# Patient Record
Sex: Male | Born: 1955 | Race: Black or African American | Hispanic: No | Marital: Married | Smoking: Never smoker
Health system: Southern US, Community
[De-identification: ages and names within clinical notes are randomized; demographics above are authoritative.]

## PROBLEM LIST (undated history)

## (undated) DIAGNOSIS — I10 Essential (primary) hypertension: Secondary | ICD-10-CM

## (undated) DIAGNOSIS — I82409 Acute embolism and thrombosis of unspecified deep veins of unspecified lower extremity: Secondary | ICD-10-CM

## (undated) DIAGNOSIS — F419 Anxiety disorder, unspecified: Secondary | ICD-10-CM

## (undated) DIAGNOSIS — J1100 Influenza due to unidentified influenza virus with unspecified type of pneumonia: Secondary | ICD-10-CM

## (undated) DIAGNOSIS — F528 Other sexual dysfunction not due to a substance or known physiological condition: Secondary | ICD-10-CM

## (undated) DIAGNOSIS — E669 Obesity, unspecified: Secondary | ICD-10-CM

## (undated) DIAGNOSIS — E7800 Pure hypercholesterolemia, unspecified: Secondary | ICD-10-CM

## (undated) DIAGNOSIS — T7840XA Allergy, unspecified, initial encounter: Secondary | ICD-10-CM

## (undated) DIAGNOSIS — M1990 Unspecified osteoarthritis, unspecified site: Secondary | ICD-10-CM

## (undated) DIAGNOSIS — E119 Type 2 diabetes mellitus without complications: Secondary | ICD-10-CM

## (undated) DIAGNOSIS — E739 Lactose intolerance, unspecified: Secondary | ICD-10-CM

## (undated) DIAGNOSIS — R7303 Prediabetes: Secondary | ICD-10-CM

## (undated) DIAGNOSIS — J45909 Unspecified asthma, uncomplicated: Secondary | ICD-10-CM

## (undated) DIAGNOSIS — J449 Chronic obstructive pulmonary disease, unspecified: Secondary | ICD-10-CM

## (undated) HISTORY — DX: Anxiety disorder, unspecified: F41.9

## (undated) HISTORY — DX: Essential (primary) hypertension: I10

## (undated) HISTORY — DX: Pure hypercholesterolemia, unspecified: E78.00

## (undated) HISTORY — DX: Chronic obstructive pulmonary disease, unspecified: J44.9

## (undated) HISTORY — DX: Influenza due to unidentified influenza virus with unspecified type of pneumonia: J11.00

## (undated) HISTORY — DX: Lactose intolerance, unspecified: E73.9

## (undated) HISTORY — DX: Obesity, unspecified: E66.9

## (undated) HISTORY — DX: Other sexual dysfunction not due to a substance or known physiological condition: F52.8

## (undated) HISTORY — DX: Acute embolism and thrombosis of unspecified deep veins of unspecified lower extremity: I82.409

## (undated) HISTORY — DX: Prediabetes: R73.03

## (undated) HISTORY — DX: Type 2 diabetes mellitus without complications: E11.9

## (undated) HISTORY — DX: Allergy, unspecified, initial encounter: T78.40XA

## (undated) HISTORY — DX: Unspecified asthma, uncomplicated: J45.909

## (undated) HISTORY — DX: Unspecified osteoarthritis, unspecified site: M19.90

## (undated) HISTORY — PX: TONSILLECTOMY AND ADENOIDECTOMY: SHX28

## (undated) HISTORY — PX: HAND SURGERY: SHX662

---

## 2002-06-20 ENCOUNTER — Ambulatory Visit (HOSPITAL_COMMUNITY): Admission: RE | Admit: 2002-06-20 | Discharge: 2002-06-20 | Payer: Self-pay | Admitting: Gastroenterology

## 2006-01-22 ENCOUNTER — Ambulatory Visit: Payer: Self-pay | Admitting: Family Medicine

## 2006-05-07 ENCOUNTER — Ambulatory Visit: Payer: Self-pay | Admitting: Family Medicine

## 2006-05-07 LAB — CONVERTED CEMR LAB
BUN: 12 mg/dL (ref 6–23)
CO2: 28 meq/L (ref 19–32)
Creatinine, Ser: 1.1 mg/dL (ref 0.4–1.5)
Glomerular Filtration Rate, Af Am: 91 mL/min/{1.73_m2}
PSA: 0.51 ng/mL (ref 0.10–4.00)
Potassium: 3.8 meq/L (ref 3.5–5.1)
Sodium: 141 meq/L (ref 135–145)

## 2006-09-02 DIAGNOSIS — E1159 Type 2 diabetes mellitus with other circulatory complications: Secondary | ICD-10-CM | POA: Insufficient documentation

## 2006-09-02 DIAGNOSIS — J309 Allergic rhinitis, unspecified: Secondary | ICD-10-CM

## 2006-09-02 DIAGNOSIS — I10 Essential (primary) hypertension: Secondary | ICD-10-CM

## 2006-09-02 DIAGNOSIS — F528 Other sexual dysfunction not due to a substance or known physiological condition: Secondary | ICD-10-CM

## 2006-10-01 ENCOUNTER — Ambulatory Visit: Payer: Self-pay | Admitting: Family Medicine

## 2006-10-01 LAB — CONVERTED CEMR LAB
BUN: 13 mg/dL (ref 6–23)
Calcium: 8.9 mg/dL (ref 8.4–10.5)
Chloride: 108 meq/L (ref 96–112)
GFR calc Af Amer: 101 mL/min
GFR calc non Af Amer: 83 mL/min
LDL Cholesterol: 113 mg/dL — ABNORMAL HIGH (ref 0–99)
Total CHOL/HDL Ratio: 5.1

## 2006-10-11 ENCOUNTER — Ambulatory Visit: Payer: Self-pay | Admitting: Family Medicine

## 2006-10-16 ENCOUNTER — Encounter: Admission: RE | Admit: 2006-10-16 | Discharge: 2006-10-16 | Payer: Self-pay | Admitting: Family Medicine

## 2006-12-20 ENCOUNTER — Ambulatory Visit: Payer: Self-pay | Admitting: Family Medicine

## 2006-12-21 ENCOUNTER — Telehealth (INDEPENDENT_AMBULATORY_CARE_PROVIDER_SITE_OTHER): Payer: Self-pay | Admitting: *Deleted

## 2007-01-03 ENCOUNTER — Encounter (INDEPENDENT_AMBULATORY_CARE_PROVIDER_SITE_OTHER): Payer: Self-pay | Admitting: Family Medicine

## 2007-01-07 ENCOUNTER — Encounter: Payer: Self-pay | Admitting: Family Medicine

## 2007-07-17 ENCOUNTER — Telehealth (INDEPENDENT_AMBULATORY_CARE_PROVIDER_SITE_OTHER): Payer: Self-pay | Admitting: *Deleted

## 2007-08-02 ENCOUNTER — Emergency Department (HOSPITAL_COMMUNITY): Admission: EM | Admit: 2007-08-02 | Discharge: 2007-08-02 | Payer: Self-pay | Admitting: Emergency Medicine

## 2007-08-16 ENCOUNTER — Telehealth (INDEPENDENT_AMBULATORY_CARE_PROVIDER_SITE_OTHER): Payer: Self-pay | Admitting: *Deleted

## 2007-09-03 ENCOUNTER — Ambulatory Visit: Payer: Self-pay | Admitting: Family Medicine

## 2007-09-04 ENCOUNTER — Encounter (INDEPENDENT_AMBULATORY_CARE_PROVIDER_SITE_OTHER): Payer: Self-pay | Admitting: *Deleted

## 2007-09-04 LAB — CONVERTED CEMR LAB
BUN: 13 mg/dL (ref 6–23)
Calcium: 9.2 mg/dL (ref 8.4–10.5)
Chloride: 105 meq/L (ref 96–112)
Creatinine, Ser: 1.1 mg/dL (ref 0.4–1.5)
GFR calc non Af Amer: 74 mL/min

## 2007-10-09 ENCOUNTER — Ambulatory Visit: Payer: Self-pay | Admitting: Family Medicine

## 2007-10-09 DIAGNOSIS — J158 Pneumonia due to other specified bacteria: Secondary | ICD-10-CM | POA: Insufficient documentation

## 2007-10-09 DIAGNOSIS — J1100 Influenza due to unidentified influenza virus with unspecified type of pneumonia: Secondary | ICD-10-CM | POA: Insufficient documentation

## 2007-10-09 LAB — CONVERTED CEMR LAB: Inflenza A Ag: POSITIVE

## 2007-10-11 ENCOUNTER — Telehealth (INDEPENDENT_AMBULATORY_CARE_PROVIDER_SITE_OTHER): Payer: Self-pay | Admitting: *Deleted

## 2007-10-17 ENCOUNTER — Ambulatory Visit: Payer: Self-pay | Admitting: Family Medicine

## 2007-12-24 ENCOUNTER — Telehealth (INDEPENDENT_AMBULATORY_CARE_PROVIDER_SITE_OTHER): Payer: Self-pay | Admitting: *Deleted

## 2007-12-24 ENCOUNTER — Encounter: Payer: Self-pay | Admitting: Family Medicine

## 2008-01-06 ENCOUNTER — Encounter: Payer: Self-pay | Admitting: Family Medicine

## 2008-01-14 ENCOUNTER — Ambulatory Visit: Payer: Self-pay | Admitting: Family Medicine

## 2008-01-14 ENCOUNTER — Telehealth (INDEPENDENT_AMBULATORY_CARE_PROVIDER_SITE_OTHER): Payer: Self-pay | Admitting: *Deleted

## 2008-01-14 DIAGNOSIS — M79609 Pain in unspecified limb: Secondary | ICD-10-CM

## 2008-01-15 ENCOUNTER — Encounter: Payer: Self-pay | Admitting: Family Medicine

## 2008-01-16 ENCOUNTER — Ambulatory Visit: Payer: Self-pay | Admitting: Family Medicine

## 2008-01-16 DIAGNOSIS — S60559A Superficial foreign body of unspecified hand, initial encounter: Secondary | ICD-10-CM | POA: Insufficient documentation

## 2008-01-23 ENCOUNTER — Encounter (INDEPENDENT_AMBULATORY_CARE_PROVIDER_SITE_OTHER): Payer: Self-pay | Admitting: *Deleted

## 2008-04-14 ENCOUNTER — Telehealth (INDEPENDENT_AMBULATORY_CARE_PROVIDER_SITE_OTHER): Payer: Self-pay | Admitting: *Deleted

## 2008-07-31 ENCOUNTER — Telehealth (INDEPENDENT_AMBULATORY_CARE_PROVIDER_SITE_OTHER): Payer: Self-pay | Admitting: *Deleted

## 2008-09-16 ENCOUNTER — Ambulatory Visit: Payer: Self-pay | Admitting: Family Medicine

## 2008-11-25 ENCOUNTER — Telehealth (INDEPENDENT_AMBULATORY_CARE_PROVIDER_SITE_OTHER): Payer: Self-pay | Admitting: *Deleted

## 2009-03-16 ENCOUNTER — Telehealth (INDEPENDENT_AMBULATORY_CARE_PROVIDER_SITE_OTHER): Payer: Self-pay | Admitting: *Deleted

## 2009-07-19 ENCOUNTER — Telehealth (INDEPENDENT_AMBULATORY_CARE_PROVIDER_SITE_OTHER): Payer: Self-pay | Admitting: *Deleted

## 2009-08-23 ENCOUNTER — Encounter: Payer: Self-pay | Admitting: Family Medicine

## 2009-09-06 ENCOUNTER — Encounter (INDEPENDENT_AMBULATORY_CARE_PROVIDER_SITE_OTHER): Payer: Self-pay | Admitting: *Deleted

## 2009-09-06 ENCOUNTER — Ambulatory Visit: Payer: Self-pay | Admitting: Family Medicine

## 2009-09-06 DIAGNOSIS — M940 Chondrocostal junction syndrome [Tietze]: Secondary | ICD-10-CM | POA: Insufficient documentation

## 2009-09-06 DIAGNOSIS — M109 Gout, unspecified: Secondary | ICD-10-CM

## 2009-10-11 ENCOUNTER — Ambulatory Visit: Payer: Self-pay | Admitting: Family Medicine

## 2009-10-11 DIAGNOSIS — M545 Low back pain: Secondary | ICD-10-CM

## 2009-10-11 LAB — CONVERTED CEMR LAB
Blood in Urine, dipstick: NEGATIVE
Nitrite: NEGATIVE
Protein, U semiquant: NEGATIVE
Specific Gravity, Urine: 1.025
Urobilinogen, UA: 0.2
WBC Urine, dipstick: NEGATIVE

## 2009-11-01 ENCOUNTER — Ambulatory Visit: Payer: Self-pay | Admitting: Family Medicine

## 2010-02-23 ENCOUNTER — Encounter: Payer: Self-pay | Admitting: Family Medicine

## 2010-02-28 ENCOUNTER — Emergency Department (HOSPITAL_COMMUNITY): Admission: EM | Admit: 2010-02-28 | Discharge: 2010-03-01 | Payer: Self-pay | Admitting: Emergency Medicine

## 2010-03-01 ENCOUNTER — Ambulatory Visit: Payer: Self-pay | Admitting: Vascular Surgery

## 2010-03-01 ENCOUNTER — Ambulatory Visit: Admission: RE | Admit: 2010-03-01 | Discharge: 2010-03-01 | Payer: Self-pay | Admitting: Emergency Medicine

## 2010-03-01 ENCOUNTER — Emergency Department (HOSPITAL_COMMUNITY): Admission: EM | Admit: 2010-03-01 | Discharge: 2010-03-01 | Payer: Self-pay | Admitting: Emergency Medicine

## 2010-03-01 ENCOUNTER — Encounter (INDEPENDENT_AMBULATORY_CARE_PROVIDER_SITE_OTHER): Payer: Self-pay | Admitting: Emergency Medicine

## 2010-03-03 ENCOUNTER — Encounter: Payer: Self-pay | Admitting: Family Medicine

## 2010-03-03 DIAGNOSIS — I82409 Acute embolism and thrombosis of unspecified deep veins of unspecified lower extremity: Secondary | ICD-10-CM | POA: Insufficient documentation

## 2010-03-04 ENCOUNTER — Ambulatory Visit: Payer: Self-pay | Admitting: Family Medicine

## 2010-03-07 ENCOUNTER — Ambulatory Visit: Payer: Self-pay | Admitting: Family Medicine

## 2010-03-08 ENCOUNTER — Telehealth (INDEPENDENT_AMBULATORY_CARE_PROVIDER_SITE_OTHER): Payer: Self-pay | Admitting: *Deleted

## 2010-03-10 ENCOUNTER — Encounter: Payer: Self-pay | Admitting: Family Medicine

## 2010-03-10 ENCOUNTER — Ambulatory Visit: Payer: Self-pay | Admitting: Family Medicine

## 2010-03-10 ENCOUNTER — Ambulatory Visit (HOSPITAL_COMMUNITY): Admission: RE | Admit: 2010-03-10 | Discharge: 2010-03-10 | Payer: Self-pay | Admitting: Family Medicine

## 2010-03-11 ENCOUNTER — Telehealth: Payer: Self-pay | Admitting: Family Medicine

## 2010-03-11 ENCOUNTER — Telehealth (INDEPENDENT_AMBULATORY_CARE_PROVIDER_SITE_OTHER): Payer: Self-pay | Admitting: *Deleted

## 2010-03-15 ENCOUNTER — Telehealth: Payer: Self-pay | Admitting: Family Medicine

## 2010-03-16 ENCOUNTER — Ambulatory Visit: Payer: Self-pay | Admitting: Family Medicine

## 2010-03-16 LAB — CONVERTED CEMR LAB: POC INR: 3.4

## 2010-03-23 ENCOUNTER — Ambulatory Visit: Payer: Self-pay | Admitting: Family Medicine

## 2010-03-23 LAB — CONVERTED CEMR LAB: POC INR: 2.7

## 2010-05-03 ENCOUNTER — Ambulatory Visit: Payer: Self-pay | Admitting: Family Medicine

## 2010-05-03 LAB — CONVERTED CEMR LAB: INR: 3.8

## 2010-05-10 ENCOUNTER — Ambulatory Visit: Payer: Self-pay | Admitting: Family Medicine

## 2010-05-10 LAB — CONVERTED CEMR LAB: INR: 3

## 2010-06-07 ENCOUNTER — Ambulatory Visit: Payer: Self-pay | Admitting: Family Medicine

## 2010-06-21 ENCOUNTER — Ambulatory Visit: Payer: Self-pay | Admitting: Family Medicine

## 2010-06-21 LAB — CONVERTED CEMR LAB: INR: 2.7

## 2010-07-19 ENCOUNTER — Ambulatory Visit
Admission: RE | Admit: 2010-07-19 | Discharge: 2010-07-19 | Payer: Self-pay | Source: Home / Self Care | Attending: Family Medicine | Admitting: Family Medicine

## 2010-07-26 ENCOUNTER — Ambulatory Visit
Admission: RE | Admit: 2010-07-26 | Discharge: 2010-07-26 | Payer: Self-pay | Source: Home / Self Care | Attending: Family Medicine | Admitting: Family Medicine

## 2010-08-09 ENCOUNTER — Ambulatory Visit
Admission: RE | Admit: 2010-08-09 | Discharge: 2010-08-09 | Payer: Self-pay | Source: Home / Self Care | Attending: Family Medicine | Admitting: Family Medicine

## 2010-08-14 LAB — CONVERTED CEMR LAB
ALT: 16 units/L (ref 0–53)
Albumin: 3.8 g/dL (ref 3.5–5.2)
Albumin: 3.9 g/dL (ref 3.5–5.2)
Alkaline Phosphatase: 43 units/L (ref 39–117)
BUN: 13 mg/dL (ref 6–23)
Basophils Relative: 0.1 % (ref 0.0–3.0)
Bilirubin Urine: NEGATIVE
Bilirubin, Direct: 0.1 mg/dL (ref 0.0–0.3)
Blood in Urine, dipstick: NEGATIVE
CO2: 27 meq/L (ref 19–32)
CRP, High Sensitivity: 1 (ref 0.00–5.00)
Calcium: 8.8 mg/dL (ref 8.4–10.5)
Calcium: 9 mg/dL (ref 8.4–10.5)
Chloride: 112 meq/L (ref 96–112)
Cholesterol: 164 mg/dL (ref 0–200)
Eosinophils Absolute: 0.1 10*3/uL (ref 0.0–0.7)
Eosinophils Absolute: 0.1 10*3/uL (ref 0.0–0.7)
GFR calc Af Amer: 101 mL/min
Glucose, Bld: 104 mg/dL — ABNORMAL HIGH (ref 70–99)
Glucose, Urine, Semiquant: NEGATIVE
HCT: 43.1 % (ref 39.0–52.0)
Hemoglobin: 13.4 g/dL (ref 13.0–17.0)
Hemoglobin: 14.3 g/dL (ref 13.0–17.0)
Ketones, urine, test strip: NEGATIVE
Lymphocytes Relative: 27 % (ref 12.0–46.0)
MCHC: 32.1 g/dL (ref 30.0–36.0)
MCV: 83.6 fL (ref 78.0–100.0)
MCV: 84.3 fL (ref 78.0–100.0)
Monocytes Absolute: 0.6 10*3/uL (ref 0.1–1.0)
Monocytes Relative: 9.3 % (ref 3.0–12.0)
Neutro Abs: 3.8 10*3/uL (ref 1.4–7.7)
Neutro Abs: 3.8 10*3/uL (ref 1.4–7.7)
Nitrite: NEGATIVE
Nitrite: NEGATIVE
RBC: 4.93 M/uL (ref 4.22–5.81)
RDW: 14.2 % (ref 11.5–14.6)
Sodium: 140 meq/L (ref 135–145)
Sodium: 142 meq/L (ref 135–145)
Specific Gravity, Urine: 1.025
Specific Gravity, Urine: 1.025
TSH: 1.26 microintl units/mL (ref 0.35–5.50)
Total CHOL/HDL Ratio: 4
Total CHOL/HDL Ratio: 5.2
Total Protein: 7 g/dL (ref 6.0–8.3)
Total Protein: 7.4 g/dL (ref 6.0–8.3)
Triglycerides: 106 mg/dL (ref 0–149)
Uric Acid, Serum: 5.3 mg/dL (ref 4.0–7.8)
WBC Urine, dipstick: NEGATIVE
pH: 5

## 2010-08-16 NOTE — Progress Notes (Signed)
Summary: Dental Questions  Phone Note Other Incoming   Caller: Dental Office Summary of Call: Dental office was calling becuase patient informed them he was now on Coumadin. They were wondering what restrictions he has with this now. Does he need to stop it a certian amount of time before dental procedures and how long before cleanings? Please give them a call back at [phone].  Initial call taken by: Harold Barban,  March 08, 2010 3:07 PM  Follow-up for Phone Call        i only said to hold off on cleaning because  THEY said he bled a lot with cleanings when he is not on coumadin--- pt should only be on coumadin 3 months and then he can have cleaning week after he stops Follow-up by: Loreen Freud DO,  March 08, 2010 3:10 PM  Additional Follow-up for Phone Call Additional follow up Details #1::        Spoke with office and they understood.  Additional Follow-up by: Harold Barban,  March 09, 2010 10:08 AM

## 2010-08-16 NOTE — Assessment & Plan Note (Signed)
   Vital Signs:  Patient profile:   55 year old male Weight:      336 pounds BMI:     44.49 O2 Sat:      98 % on Room air Temp:     99.1 degrees F Pulse rate:   79 / minute BP sitting:   120 / 68  O2 Flow:  Room air  History of Present Illness: Pt here to f/u ER.  He went to ER with R calf pain and swelling and was dx with DVT.  Pt was started on lovenox and coumadin 10mg  daily.  Pt states pain is better.    Current Medications (verified): 1)  Aspirin Ec 81 Mg Tbec (Aspirin) .... Take 1 Tablet By Mouth Every Morning 2)  Zyrtec 10 Mg Tabs (Cetirizine Hcl) .... Take One Tablet Daily 3)  Cialis 20 Mg Tabs (Tadalafil) .... Take One Tablet Daily 4)  Norvasc 5 Mg Tabs (Amlodipine Besylate) .... Take 1 Tablet By Mouth Once A Day. 5)  Diovan 160 Mg  Tabs (Valsartan) .... Take One Tablet Twice Daily. 6)  Flexeril 10 Mg Tabs (Cyclobenzaprine Hcl) .[name].. 1 By Mouth Three Times A Day As Needed  Allergies (verified): 1)  ! Penicillin G Potassium (Penicillin G Potassium)  Past History:  Past medical, surgical, family and social histories (including risk factors) reviewed for relevance to current acute and chronic problems.  Past Medical History: Reviewed history from 09/06/2009 and no changes required. Hypertension Allergic rhinitis Current Problems:  INFLUENZA WITH PNEUMONIA (ICD-487.0) PNEUMONIA DUE TO OTHER SPECIFIED BACTERIA (ICD-482.89) ALLERGIC RHINITIS (ICD-477.9) ERECTILE DYSFUNCTION (ICD-302.72) HYPERTENSION (ICD-401.9) Gout  Past Surgical History: Reviewed history from 12/20/2006 and no changes required. Tonsillectomy Repair right thumb after laceration  Family History: Reviewed history from 01/14/2008 and no changes required. Alzheimer Family History Hypertension Family History of Alcoholism/Addiction--F  Social History: Reviewed history from 12/20/2006 and no changes required. Occupation: Pensions consultant Married with 1 child Never Smoked Alcohol use-yes Drug  use-no Regular exercise-no  Review of Systems      See HPI  Physical Exam  General:  Well-developed,well-nourished,in no acute distress; alert,appropriate and cooperative throughout examination Extremities:  No calf pain + ropey vein R thigh no errythema  Psych:  Cognition and judgment appear intact. Alert and cooperative with normal attention span and concentration. No apparent delusions, illusions, hallucinations   Impression & Recommendations:  Problem # 1:  DVT (ICD-453.40)  con't lovenox  coumadin 10 mg daily until Sunday then decrease to 5 mg daily recheck PT/INR Monday  Orders: Protime ([id number])  Complete Medication List: 1)  Aspirin Ec 81 Mg Tbec (Aspirin) .... Take 1 tablet by mouth every morning 2)  Zyrtec 10 Mg Tabs (Cetirizine hcl) .... Take one tablet daily 3)  Cialis 20 Mg Tabs (Tadalafil) .... Take one tablet daily 4)  Norvasc 5 Mg Tabs (Amlodipine besylate) .... Take 1 tablet by mouth once a day. 5)  Diovan 160 Mg Tabs (Valsartan) .... Take one tablet twice daily. 6)  Flexeril 10 Mg Tabs (Cyclobenzaprine hcl) .[name].. 1 by mouth three times a day as needed

## 2010-08-16 NOTE — Letter (Signed)
Summary: [facility]  [facility]   Imported By: Lanelle Bal 08/27/2009 07:56:30  _____________________________________________________________________  External Attachment:    Type:   Image     Comment:   External Document

## 2010-08-16 NOTE — Progress Notes (Signed)
Summary: MED REACTION  Phone Note Call from Patient Call back at [phone]   Caller: Patient Summary of Call: PT SEEN ON YESTERDAY AND WAS RX KEFLEX 500 MG CAPS. PT STATES THAT HE HAS A ALLERGY TO PENICILLIN. PT STATES THAT WHEN HE TAKES MED IT CAUSE DIZZINESS AND BLACKOUT..............Marland KitchenFelecia Deloach CMA  March 11, 2010 11:38 AM   Follow-up for Phone Call        erythromycin 500 1 by mouth two times a day #20   Follow-up by: Loreen Freud DO,  March 11, 2010 11:56 AM  Additional Follow-up for Phone Call Additional follow up Details #1::        Rx sent to [facility] on [address], Pt made aware . Additional Follow-up by: Almeta Monas CMA (AAMA),  March 11, 2010 11:59 AM    New/Updated Medications: ERYTHROMYCIN BASE 500 MG TABS (ERYTHROMYCIN BASE) 1 by mouth two times a day x10 days Prescriptions: ERYTHROMYCIN BASE 500 MG TABS (ERYTHROMYCIN BASE) 1 by mouth two times a day x10 days  #20 x 0   Entered by:   Almeta Monas CMA (AAMA)   Authorized by:   Loreen Freud DO   Signed by:   Almeta Monas CMA (AAMA) on 03/11/2010   Method used:   Faxed to ...       [facility] [address] #[id number]* (retail)       [address]       [place], [place]  [address]       Ph: [phone]       Fax: [phone]   RxID:   [id number]

## 2010-08-16 NOTE — Progress Notes (Signed)
Summary: refill request and pharmacy change   Phone Note Refill Request Message from:  Patient on July 19, 2009 12:16 PM  Refills Requested: Medication #1:  NORVASC 5 MG TABS Take 1 tablet by mouth once a day  Medication #2:  DIOVAN 160 MG  TABS Take one tablet twice daily. ******Uses a different pharmacy now!! patient uses [facility] on [address].....   Initial call taken by: Michaelle Copas,  July 19, 2009 12:18 PM    New/Updated Medications: NORVASC 5 MG TABS (AMLODIPINE BESYLATE) Take 1 tablet by mouth once a day. NEEDS OFFICE VISIT BEFORE ADDITONAL REFILLS. DIOVAN 160 MG  TABS (VALSARTAN) Take one tablet twice daily. NEEDS OFFICE VISIT BEFORE ADDTIONAL REFILLS. Prescriptions: DIOVAN 160 MG  TABS (VALSARTAN) Take one tablet twice daily. NEEDS OFFICE VISIT BEFORE ADDTIONAL REFILLS.  #60 x 0   Entered by:   [name] CMA   Authorized by:   Loreen Freud DO   Signed by:   [name] CMA on 07/19/2009   Method used:   Electronically to        [facility] #[id number]* (retail)       [address] [place], [place]  [address]       Ph: [phone]       Fax: [phone]   RxID:   [id number] NORVASC 5 MG TABS (AMLODIPINE BESYLATE) Take 1 tablet by mouth once a day. NEEDS OFFICE VISIT BEFORE ADDITONAL REFILLS.  #30 x 0   Entered by:   [name] CMA   Authorized by:   Loreen Freud DO   Signed by:   [name] CMA on 07/19/2009   Method used:   Electronically to        [facility] #[id number]* (retail)       [address] [place], [place]  [address]       Ph: [phone]       Fax: [phone]   RxID:   [id number]

## 2010-08-16 NOTE — Progress Notes (Signed)
Summary: RF request  Phone Note Refill Request Message from:  Fax from Pharmacy on March 15, 2010 3:51 PM  Refills Requested: Medication #1:  NORVASC 5 MG TABS Take 1 tablet by mouth once a day.   Last Refilled: 09/20/2009 last OV 03/10/10 last filled 09/20/09  Initial call taken by: Almeta Monas CMA Duncan Dull),  March 15, 2010 3:51 PM Caller: [facility] [address] #[id number]* Summary of Call: refill request  Follow-up for Phone Call        refill 1 month 5 refills Follow-up by: Loreen Freud DO,  March 15, 2010 5:20 PM    Prescriptions: NORVASC 5 MG TABS (AMLODIPINE BESYLATE) Take 1 tablet by mouth once a day.  #30 x 5   Entered by:   Jeremy Johann CMA   Authorized by:   Loreen Freud DO   Signed by:   Jeremy Johann CMA on 03/16/2010   Method used:   Faxed to ...       [facility] #[id number]* (retail)       [address]       [place], [place]  [address]       Ph: [phone]       Fax: [phone]   RxID:   [id number]

## 2010-08-16 NOTE — Assessment & Plan Note (Signed)
Summary: PT CHECK Daniel Powers  Nurse Visit   Vital Signs:  Patient profile:   55 year old male Height:      73 inches Weight:      334 pounds Temp:     98.2 degrees F oral Pulse rate:   82 / minute BP sitting:   130 / 82  (left arm)  Vitals Entered By: Jeremy Johann CMA (May 03, 2010 9:53 AM)  CC: pt check   Current Medications (verified): 1)  Aspirin Ec 81 Mg Tbec (Aspirin) .... Take 1 Tablet By Mouth Every Morning 2)  Zyrtec 10 Mg Tabs (Cetirizine Hcl) .... Take One Tablet Daily 3)  Cialis 20 Mg Tabs (Tadalafil) .... Take One Tablet Daily 4)  Norvasc 5 Mg Tabs (Amlodipine Besylate) .... Take 1 Tablet By Mouth Once A Day. 5)  Diovan 160 Mg  Tabs (Valsartan) .... Take One Tablet Twice Daily. 6)  Flexeril 10 Mg Tabs (Cyclobenzaprine Hcl) .[name].. 1 By Mouth Three Times A Day As Needed 7)  Coumadin 5 Mg Tabs (Warfarin Sodium) .[name].. 1 By Mouth Once Daily 8)  Erythromycin Base 500 Mg Tabs (Erythromycin Base) .[name].. 1 By Mouth Two Times A Day X10 Days  Allergies (verified): 1)  ! Penicillin G Potassium (Penicillin G Potassium) Laboratory Results   Blood Tests    Date/Time Reported: May 03, 2010 10:01 AM   INR: 3.8   (Normal Range: 0.88-1.12   Therap INR: 2.0-3.5)    Orders Added: 1)  Est. Patient Level I [64403] 2)  Protime [[id number]]   ANTICOAGULATION RECORD  NEW REGIMEN & LAB RESULTS Current INR: 3.8 Current Coumadin Dose(mg): (5 mg tab)1 tab qd except 1 1/2 tab M,W,F Regimen: 1 tab M,W,F, Sat 1 1/2 all other days  Provider: Laury Axon Repeat testing in: 1 week  Anticoagulation Visit Questionnaire Coumadin dose missed/changed:  No Abnormal Bleeding Symptoms:  No  Any diet changes including alcohol intake, vegetables or greens since the last visit:  No Any illnesses or hospitalizations since the last visit:  No Any signs of clotting since the last visit (including chest discomfort, dizziness, shortness of breath, arm tingling, slurred speech, swelling or redness in  leg):  No  MEDICATIONS ASPIRIN EC 81 MG TBEC (ASPIRIN) Take 1 tablet by mouth every morning ZYRTEC 10 MG TABS (CETIRIZINE HCL) Take one tablet daily CIALIS 20 MG TABS (TADALAFIL) Take one tablet daily NORVASC 5 MG TABS (AMLODIPINE BESYLATE) Take 1 tablet by mouth once a day. DIOVAN 160 MG  TABS (VALSARTAN) Take one tablet twice daily. FLEXERIL 10 MG TABS (CYCLOBENZAPRINE HCL) 1 by mouth three times a day as needed COUMADIN 5 MG TABS (WARFARIN SODIUM) 1 by mouth once daily ERYTHROMYCIN BASE 500 MG TABS (ERYTHROMYCIN BASE) 1 by mouth two times a day x10 days   Appended Document: PT CHECK /KN

## 2010-08-16 NOTE — Assessment & Plan Note (Signed)
Summary: per dr lowne/cbs   Vital Signs:  Patient profile:   55 year old male Weight:      333 pounds BMI:     44.09 O2 Sat:      97 % on Room air Pulse rate:   88 / minute Pulse rhythm:   regular BP sitting:   140 / 80  (right arm)  Vitals Entered By: Almeta Monas CMA Duncan Dull) (March 10, 2010 10:22 AM)  O2 Flow:  Room air CC: swelling to the right leg   History of Present Illness: Pt here c/o pain and swelling in R thigh where clot is.   He was here for Pt check and c/o of it.  No other symptoms.  Current Medications (verified): 1)  Aspirin Ec 81 Mg Tbec (Aspirin) .... Take 1 Tablet By Mouth Every Morning 2)  Zyrtec 10 Mg Tabs (Cetirizine Hcl) .... Take One Tablet Daily 3)  Cialis 20 Mg Tabs (Tadalafil) .... Take One Tablet Daily 4)  Norvasc 5 Mg Tabs (Amlodipine Besylate) .... Take 1 Tablet By Mouth Once A Day. 5)  Diovan 160 Mg  Tabs (Valsartan) .... Take One Tablet Twice Daily. 6)  Flexeril 10 Mg Tabs (Cyclobenzaprine Hcl) .[name].. 1 By Mouth Three Times A Day As Needed 7)  Coumadin 5 Mg Tabs (Warfarin Sodium) .[name].. 1 By Mouth Once Daily 8)  Keflex 500 Mg Caps (Cephalexin) .[name].. 1 By Mouth Two Times A Day  Allergies (verified): 1)  ! Penicillin G Potassium (Penicillin G Potassium)  Past History:  Past medical, surgical, family and social histories (including risk factors) reviewed for relevance to current acute and chronic problems.  Past Medical History: Reviewed history from 09/06/2009 and no changes required. Hypertension Allergic rhinitis Current Problems:  INFLUENZA WITH PNEUMONIA (ICD-487.0) PNEUMONIA DUE TO OTHER SPECIFIED BACTERIA (ICD-482.89) ALLERGIC RHINITIS (ICD-477.9) ERECTILE DYSFUNCTION (ICD-302.72) HYPERTENSION (ICD-401.9) Gout  Past Surgical History: Reviewed history from 12/20/2006 and no changes required. Tonsillectomy Repair right thumb after laceration  Family History: Reviewed history from 01/14/2008 and no changes  required. Alzheimer Family History Hypertension Family History of Alcoholism/Addiction--F  Social History: Reviewed history from 12/20/2006 and no changes required. 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   Skin:  R thigh---  warm to touch and tight,  tender to touch, swollen  Psych:  Oriented X3 and normally interactive.     Impression & Recommendations:  Problem # 1:  DVT (ICD-453.40) INR 2.4  Orders: Doppler Referral (Doppler)  Complete Medication List: 1)  Aspirin Ec 81 Mg Tbec (Aspirin) .... Take 1 tablet by mouth every morning 2)  Zyrtec 10 Mg Tabs (Cetirizine hcl) .... Take one tablet daily 3)  Cialis 20 Mg Tabs (Tadalafil) .... Take one tablet daily 4)  Norvasc 5 Mg Tabs (Amlodipine besylate) .... Take 1 tablet by mouth once a day. 5)  Diovan 160 Mg Tabs (Valsartan) .... Take one tablet twice daily. 6)  Flexeril 10 Mg Tabs (Cyclobenzaprine hcl) .[name].. 1 by mouth three times a day as needed 7)  Coumadin 5 Mg Tabs (Warfarin sodium) .[name].. 1 by mouth once daily 8)  Keflex 500 Mg Caps (Cephalexin) .[name].. 1 by mouth two times a day Prescriptions: KEFLEX  500 MG CAPS (CEPHALEXIN) 1 by mouth two times a day  #20 x 0   Entered and Authorized by:   Loreen Freud DO   Signed by:   Loreen Freud DO on 03/10/2010   Method used:   Electronically to        [facility] #[id number]* (retail)       [address]       [place], [place]  [address]       Ph: [phone]       Fax: [phone]   RxID:   [id number]

## 2010-08-16 NOTE — Letter (Signed)
Summary: [facility]: Immunoassay Fecal Occult Blood (iFOB) Order Form  [facility]  [address] [place], [place] [address]   Phone: [phone]  Fax: [phone]      [facility]: Immunoassay Fecal Occult Blood (iFOB) Order Form   September 06, 2009 MRN: [id number]   Daniel Powers 03-12-1956   Physicican Name:____Yvonne Lowne,DO_____________________  Diagnosis Code:____v76.51______________________      [name] CMA

## 2010-08-16 NOTE — Assessment & Plan Note (Signed)
Summary: PT CHECK//PH  Nurse Visit   Vital Signs:  Patient profile:   55 year old male Height:      73 inches (185.42 cm) Weight:      332.25 pounds (151.02 kg) BMI:     43.99 Temp:     99.1 degrees F (37.28 degrees C) oral BP sitting:   112 / 60  (left arm) Cuff size:   large  Vitals Entered By: Lucious Groves CMA (June 21, 2010 10:10 AM) CC: PT check./kb   Allergies: 1)  ! Penicillin G Potassium (Penicillin G Potassium) Laboratory Results   Blood Tests      INR: 2.7   (Normal Range: 0.88-1.12   Therap INR: 2.0-3.5)    Orders Added: 1)  Est. Patient Level I [30160] 2)  Protime [[id number]]   ANTICOAGULATION RECORD PREVIOUS REGIMEN & LAB RESULTS   Previous INR:  3.0 on  05/10/2010 Previous Coumadin Dose(mg):  1 tab M,W,F, Sat 1 1/2 all other days on  05/10/2010 Previous Regimen:  no change on  05/10/2010  NEW REGIMEN & LAB RESULTS Anticoag. Dx: Deep venous thrombosis Current INR: 2.7 Current Coumadin Dose(mg): unknown Regimen: no change  Paublo Warshawsky: Lowne Repeat testing in: 4 weeks Other Comments: Patient did not remember the dosage he has at home, but notes that he take 1 tab on M,W,F, Sat and 1.5 tabs all other days. Patient was advised to continue the same and re-check in 4 weeks. Lucious Groves CMA  June 21, 2010 10:12 AM   Reviewed by: Lucious Groves

## 2010-08-16 NOTE — Assessment & Plan Note (Signed)
Summary: cpx- jr   Vital Signs:  Patient profile:   55 year old male Height:      73 inches Weight:      325 pounds BMI:     43.03 Temp:     98.4 degrees F oral Pulse rate:   92 / minute Pulse rhythm:   regular BP sitting:   138 / 86  (left arm) Cuff size:   large  Vitals Entered By: [name] CMA (September 06, 2009 9:14 AM)  History of Present Illness: Pt here for cpe and labs.  No complaints.   BPs at home range from  115/62--142/83.  Hypertension follow-up      This is a 55 year old man who presents for Hypertension follow-up.  The patient denies lightheadedness, urinary frequency, headaches, edema, impotence, rash, and fatigue.  The patient denies the following associated symptoms: chest pain, chest pressure, exercise intolerance, dyspnea, palpitations, syncope, leg edema, and pedal edema.  Compliance with medications (by patient report) has been near 100%.  The patient reports that dietary compliance has been poor.  The patient reports exercising occasionally.  Adjunctive measures currently used by the patient include salt restriction.    Preventive Screening-Counseling & Management  Alcohol-Tobacco     Alcohol drinks/day: <1     Alcohol type: mixed--rum     Smoking Status: never     Passive Smoke Exposure: no  Caffeine-Diet-Exercise     Caffeine use/day: 2     Diet Comments: needs improvement     Does Patient Exercise: no     Exercise Counseling: to improve exercise regimen  Hep-HIV-STD-Contraception     HIV Risk: no     Dental Visit-last 6 months yes     Dental Care Counseling: to seek dental care; no dental care within six months  Safety-Violence-Falls     Seat Belt Use: 100      Sexual History:  currently monogamous and married.    Current Medications (verified): 1)  Aspirin Ec 81 Mg Tbec (Aspirin) .... Take 1 Tablet By Mouth Every Morning 2)  Zyrtec 10 Mg Tabs (Cetirizine Hcl) .... Take One Tablet Daily 3)  Cialis 20 Mg Tabs (Tadalafil) .... Take One  Tablet Daily 4)  Norvasc 5 Mg Tabs (Amlodipine Besylate) .... Take 1 Tablet By Mouth Once A Day. Needs Office Visit Before Additonal Refills. 5)  Diovan 160 Mg  Tabs (Valsartan) .... Take One Tablet Twice Daily. Needs Office Visit Before Addtional Refills. 6)  Tussionex Pennkinetic Er 8-10 Mg/73ml Lqcr (Chlorpheniramine-Hydrocodone) .[name].. 1 Tsp P At Bedtime As Needed  Allergies: 1)  ! 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 Never Smoked Alcohol use-yes Drug use-no Regular exercise-no  Risk Factors: Alcohol Use: <1 (09/06/2009) Caffeine Use: 2 (09/06/2009) Diet: needs improvement (09/06/2009) Exercise: no (09/06/2009)  Risk Factors: Smoking Status: never (09/06/2009) Passive Smoke Exposure: no (09/06/2009)  Past Medical History: Hypertension Allergic rhinitis Current Problems:  INFLUENZA WITH PNEUMONIA (ICD-487.0) PNEUMONIA DUE TO OTHER SPECIFIED BACTERIA (ICD-482.89) ALLERGIC RHINITIS (ICD-477.9) ERECTILE DYSFUNCTION (ICD-302.72) HYPERTENSION (ICD-401.9) Gout  Family History: Reviewed history from 01/14/2008 and no changes required. Alzheimer Family History Hypertension Family History of Alcoholism/Addiction--F  Social History: Reviewed history from 12/20/2006 and no changes required. 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 Eyes:  Denies  blurring, discharge, double vision, eye irritation, eye pain, halos, itching, light sensitivity, red eye, vision loss-1 eye, and vision loss-both eyes; optho--due. ENT:  Denies decreased hearing, difficulty swallowing, ear discharge, earache, hoarseness, nasal congestion, nosebleeds, postnasal drainage, ringing in ears, sinus pressure, and sore throat. CV:  Denies bluish discoloration of lips or nails, chest pain or discomfort, difficulty breathing at night, difficulty breathing while lying down, fainting, fatigue, leg cramps with exertion, lightheadness, near fainting, palpitations, shortness of breath with exertion, swelling of feet, swelling of hands, and weight gain. Resp:  Denies chest discomfort, chest pain with inspiration, cough, coughing up blood, excessive snoring, hypersomnolence, morning headaches, pleuritic, shortness of breath, sputum productive, and wheezing. GI:  Denies abdominal pain, bloody stools, change in bowel habits, constipation, dark tarry stools, diarrhea, excessive appetite, gas, hemorrhoids, indigestion, loss of appetite, nausea, vomiting, vomiting blood, and yellowish skin color. GU:  Denies decreased libido, discharge, dysuria, erectile dysfunction, genital sores, hematuria, incontinence, nocturia, urinary frequency, and urinary hesitancy. MS:  Denies joint pain, joint redness, joint swelling, loss of strength, low back pain, mid back pain, muscle aches, muscle , cramps, muscle weakness, stiffness, and thoracic pain. Derm:  Denies changes in color of skin, changes in nail beds, dryness, excessive perspiration, flushing, hair loss, insect bite(s), itching, lesion(s), poor wound healing, and rash. Neuro:  Denies brief paralysis, difficulty with concentration, disturbances in coordination, falling down, headaches, inability to speak, memory loss, numbness, poor balance, seizures, sensation of room spinning, tingling, tremors, visual disturbances, and weakness. Psych:  Denies  alternate hallucination ( auditory/visual), anxiety, depression, easily angered, easily tearful, irritability, mental problems, panic attacks, sense of great danger, suicidal thoughts/plans, thoughts of violence, unusual visions or sounds, and thoughts /plans of harming others. Endo:  Denies cold intolerance, excessive hunger, excessive thirst, excessive urination, heat intolerance, polyuria, and weight change. Heme:  Denies abnormal bruising, bleeding, enlarge lymph nodes, fevers, pallor, and skin discoloration. Allergy:  Denies hives or rash, itching eyes, persistent infections, seasonal allergies, and sneezing.  Physical Exam  General:  Well-developed,well-nourished,in no acute distress; alert,appropriate and cooperative throughout examinationoverweight-appearing.   Head:  Normocephalic and atraumatic without obvious abnormalities. No apparent alopecia or balding. Eyes:  pupils equal, pupils round, pupils reactive to light, and no injection.   Ears:  External ear exam shows no significant lesions or deformities.  Otoscopic examination reveals clear canals, tympanic membranes are intact bilaterally without bulging, retraction, inflammation or discharge. Hearing is grossly normal bilaterally. Nose:  External nasal examination shows no deformity or inflammation. Nasal mucosa are pink and moist without lesions or exudates. Mouth:  Oral mucosa and oropharynx without lesions or exudates.  Teeth in good repair. Neck:  No deformities, masses, or tenderness noted.no carotid bruits.   Chest Wall:  No deformities, masses, tenderness or gynecomastia noted. Lungs:  Normal respiratory effort, chest expands symmetrically. Lungs are clear to auscultation, no crackles or wheezes. Heart:  normal rate and no murmur.   Abdomen:  Bowel sounds positive,abdomen soft and non-tender without masses, organomegaly  + small ventral hernia Rectal:  No external abnormalities noted. Normal sphincter tone. No rectal masses or  tenderness. heme negative brown stool Genitalia:  Testes bilaterally descended without nodularity, tenderness or masses. No scrotal masses  or lesions. No penis lesions or urethral discharge. Prostate:  Prostate gland firm and smooth, no enlargement, nodularity, tenderness, mass, asymmetry or induration. Msk:  normal ROM, no joint tenderness, no joint swelling, no joint warmth, no redness over joints, no joint deformities, no joint instability, and no crepitation.   Pulses:  R posterior tibial normal, R dorsalis pedis normal, R carotid normal, L posterior tibial normal, L dorsalis pedis normal, and L carotid normal.   Extremities:  No clubbing, cyanosis, edema, or deformity noted with normal full range of motion of all joints.   Neurologic:  No cranial nerve deficits noted. Station and gait are normal. Plantar reflexes are down-going bilaterally. DTRs are symmetrical throughout. Sensory, motor and coordinative functions appear intact. Skin:  Intact without suspicious lesions or rashes Cervical Nodes:  No lymphadenopathy noted Axillary Nodes:  No palpable lymphadenopathy Psych:  Cognition and judgment appear intact. Alert and cooperative with normal attention span and concentration. No apparent delusions, illusions, hallucinations   Impression & Recommendations:  Problem # 1:  PREVENTIVE HEALTH CARE (ICD-V70.0)  Orders: Venipuncture (25366) TLB-Lipid Panel (80061-LIPID) TLB-BMP (Basic Metabolic Panel-BMET) (80048-METABOL) TLB-CBC Platelet - w/Differential (85025-CBCD) TLB-Hepatic/Liver Function Pnl (80076-HEPATIC) TLB-TSH (Thyroid Stimulating Hormone) (84443-TSH) TLB-PSA (Prostate Specific Antigen) (84153-PSA) TLB-Uric Acid, Blood (84550-URIC) EKG w/ Interpretation (93000) UA Dipstick w/o Micro (manual) (44034)  Reviewed preventive care protocols, scheduled due services, and updated immunizations.  Problem # 2:  ERECTILE DYSFUNCTION (ICD-302.72)  His updated medication list for this  problem includes:    Cialis 20 Mg Tabs (Tadalafil) .[name]... Take one tablet daily  Problem # 3:  HYPERTENSION (ICD-401.9)  His updated medication list for this problem includes:    Norvasc 5 Mg Tabs (Amlodipine besylate) .[name]... Take 1 tablet by mouth once a day. needs office visit before additonal refills.    Diovan 160 Mg Tabs (Valsartan) .[name]... Take one tablet twice daily. needs office visit before addtional refills.  Orders: Venipuncture (74259) TLB-Lipid Panel (80061-LIPID) TLB-BMP (Basic Metabolic Panel-BMET) (80048-METABOL) TLB-CBC Platelet - w/Differential (85025-CBCD) TLB-Hepatic/Liver Function Pnl (80076-HEPATIC) TLB-TSH (Thyroid Stimulating Hormone) (84443-TSH) TLB-PSA (Prostate Specific Antigen) (84153-PSA) TLB-Uric Acid, Blood (84550-URIC) EKG w/ Interpretation (93000)  BP today: 138/86 Prior BP: 140/80 (09/16/2008)  Labs Reviewed: K+: 3.8 (01/14/2008) Creat: : 1.0 (01/14/2008)   Chol: 164 (01/14/2008)   HDL: 31.4 (01/14/2008)   LDL: 111 (01/14/2008)   TG: 106 (01/14/2008)  Complete Medication List: 1)  Aspirin Ec 81 Mg Tbec (Aspirin) .... Take 1 tablet by mouth every morning 2)  Zyrtec 10 Mg Tabs (Cetirizine hcl) .... Take one tablet daily 3)  Cialis 20 Mg Tabs (Tadalafil) .... Take one tablet daily 4)  Norvasc 5 Mg Tabs (Amlodipine besylate) .... Take 1 tablet by mouth once a day. needs office visit before additonal refills. 5)  Diovan 160 Mg Tabs (Valsartan) .... Take one tablet twice daily. needs office visit before addtional refills. 6)  Tussionex Pennkinetic Er 8-10 Mg/13ml Lqcr (Chlorpheniramine-hydrocodone) .[name].. 1 tsp p at bedtime as needed  Other Orders: Tdap => 28yrs IM (56387) Admin 1st Vaccine (56433)   EKG  Procedure date:  09/06/2009  Findings:      Normal sinus rhythm with rate of:  84 bpm    Flu Vaccine Next Due:  Refused   Immunizations Administered:  Tetanus Vaccine:    Vaccine Type: Tdap    Site: right deltoid    Mfr: GlaxoSmithKline     Dose: 0.5 ml    Route: IM    Given by: [name] CMA  Exp. Date: 09/11/2011    Lot #: [id number]   Laboratory Results   Urine Tests   Date/Time Reported: September 06, 2009 10:27 AM   Routine Urinalysis   Color: orange Appearance: Clear Glucose: negative   (Normal Range: Negative) Bilirubin: negative   (Normal Range: Negative) Ketone: negative   (Normal Range: Negative) Spec. Gravity: 1.025   (Normal Range: 1.003-1.035) Blood: negative   (Normal Range: Negative) pH: 6.0   (Normal Range: 5.0-8.0) Protein: trace   (Normal Range: Negative) Urobilinogen: 0.2   (Normal Range: 0-1) Nitrite: negative   (Normal Range: Negative) Leukocyte Esterace: negative   (Normal Range: Negative)    Comments: Floydene Flock  September 06, 2009 10:27 AM'

## 2010-08-16 NOTE — Assessment & Plan Note (Signed)
Summary: pt//lch  Nurse Visit   Vital Signs:  Patient profile:   55 year old male Height:      73 inches Weight:      335 pounds Temp:     98.1 degrees F oral Pulse rate:   76 / minute BP sitting:   130 / 82  (left arm)  Vitals Entered By: Jeremy Johann CMA (May 10, 2010 9:06 AM) CC: pt check   Current Medications (verified): 1)  Zyrtec 10 Mg Tabs (Cetirizine Hcl) .... Take One Tablet Daily 2)  Cialis 20 Mg Tabs (Tadalafil) .... Take One Tablet Daily 3)  Norvasc 5 Mg Tabs (Amlodipine Besylate) .... Take 1 Tablet By Mouth Once A Day. 4)  Diovan 160 Mg  Tabs (Valsartan) .... Take One Tablet Twice Daily. 5)  Coumadin 5 Mg Tabs (Warfarin Sodium) .[name].. 1 By Mouth Once Daily 6)  Fish Oil 1200 Mg Caps (Omega-3 Fatty Acids) .... Take 1 Tab Two Times A Day  Allergies (verified): 1)  ! Penicillin G Potassium (Penicillin G Potassium) Laboratory Results   Blood Tests    Date/Time Reported: May 10, 2010 9:10 AM   INR: 3.0   (Normal Range: 0.88-1.12   Therap INR: 2.0-3.5)    Orders Added: 1)  Est. Patient Level I [16109] 2)  Protime [60454UJ]    ANTICOAGULATION RECORD PREVIOUS REGIMEN & LAB RESULTS   Previous INR:  3.8 on  05/03/2010 Previous Coumadin Dose(mg):  (5 mg tab)1 tab qd except 1 1/2 tab M,W,F on  05/03/2010 Previous Regimen:  1 tab M,W,F, Sat 1 1/2 all other days on  05/03/2010  NEW REGIMEN & LAB RESULTS Current INR: 3.0 Current Coumadin Dose(mg): 1 tab M,W,F, Sat 1 1/2 all other days Regimen: no change  Charlea Nardo: dr Laury Axon Repeat testing in: 4 weeks  Anticoagulation Visit Questionnaire Coumadin dose missed/changed:  No Abnormal Bleeding Symptoms:  No  Any diet changes including alcohol intake, vegetables or greens since the last visit:  No Any illnesses or hospitalizations since the last visit:  No Any signs of clotting since the last visit (including chest discomfort, dizziness, shortness of breath, arm tingling, slurred speech, swelling or  redness in leg):  No  MEDICATIONS ZYRTEC 10 MG TABS (CETIRIZINE HCL) Take one tablet daily CIALIS 20 MG TABS (TADALAFIL) Take one tablet daily NORVASC 5 MG TABS (AMLODIPINE BESYLATE) Take 1 tablet by mouth once a day. DIOVAN 160 MG  TABS (VALSARTAN) Take one tablet twice daily. COUMADIN 5 MG TABS (WARFARIN SODIUM) 1 by mouth once daily FISH OIL 1200 MG CAPS (OMEGA-3 FATTY ACIDS) Take 1 tab two times a day

## 2010-08-16 NOTE — Progress Notes (Signed)
Summary: rx 8/29  Phone Note Other Incoming   Summary of Call: please resend rx for erythromycin. rx was suppposed to go to [facility] [address]. Initial call taken by: Lavell Islam,  March 11, 2010 3:38 PM  Follow-up for Phone Call        Rx sent to [facility] [address] like pt requested, tried calling to confirm, No ans     Almeta Monas CMA Duncan Dull)  March 11, 2010 5:05 PM   lmtc on vm at work. Almeta Monas CMA Duncan Dull)  March 14, 2010 2:21 PM

## 2010-08-16 NOTE — Letter (Signed)
Summary: [facility]  [facility]   Imported By: Lanelle Bal 04/29/2010 08:13:19  _____________________________________________________________________  External Attachment:    Type:   Image     Comment:   External Document

## 2010-08-16 NOTE — Assessment & Plan Note (Signed)
Summary: BACK SPASMS/RH......[name]   Vital Signs:  Patient profile:   55 year old male Weight:      329 pounds Pulse rate:   96 / minute Pulse rhythm:   regular BP sitting:   130 / 82  (left arm) Cuff size:   large  Vitals Entered By: [name] CMA (October 11, 2009 2:59 PM) CC: Pt here c/o back spasms x 3 weeks. Worse in the a.m. and when he has a bowel movement he states it is very painful., Back Pain   History of Present Illness:       This is a 55 year old man who presents with Back Pain.  The symptoms began 3 weeks ago.  Pt woke up 3 weeks ago with "crick " in back.  The patient denies fever, chills, weakness, loss of sensation, fecal incontinence, urinary incontinence, urinary retention, dysuria, rest pain, inability to work, and inability to care for self.  The pain is located in the right low back.  The pain began at home and suddenly.  The pain is made worse by activity and defecation.  The pain is made better by inactivity.  Risk factors for serious underlying conditions include bedrest with no relief.    Current Medications (verified): 1)  Aspirin Ec 81 Mg Tbec (Aspirin) .... Take 1 Tablet By Mouth Every Morning 2)  Zyrtec 10 Mg Tabs (Cetirizine Hcl) .... Take One Tablet Daily 3)  Cialis 20 Mg Tabs (Tadalafil) .... Take One Tablet Daily 4)  Norvasc 5 Mg Tabs (Amlodipine Besylate) .... Take 1 Tablet By Mouth Once A Day. 5)  Diovan 160 Mg  Tabs (Valsartan) .... Take One Tablet Twice Daily. 6)  Flexeril 10 Mg Tabs (Cyclobenzaprine Hcl) .[name].. 1 By Mouth Three Times A Day As Needed  Allergies: 1)  ! Penicillin G Potassium (Penicillin G Potassium)  Past History:  Past medical, surgical, family and social histories (including risk factors) reviewed for relevance to current acute and chronic problems.  Past Medical History: Reviewed history from 09/06/2009 and no changes required. Hypertension Allergic rhinitis Current Problems:  INFLUENZA WITH PNEUMONIA  (ICD-487.0) PNEUMONIA DUE TO OTHER SPECIFIED BACTERIA (ICD-482.89) ALLERGIC RHINITIS (ICD-477.9) ERECTILE DYSFUNCTION (ICD-302.72) HYPERTENSION (ICD-401.9) Gout  Past Surgical History: Reviewed history from 12/20/2006 and no changes required. Tonsillectomy Repair right thumb after laceration  Family History: Reviewed history from 01/14/2008 and no changes required. Alzheimer Family History Hypertension Family History of Alcoholism/Addiction--F  Social History: Reviewed history from 12/20/2006 and no changes required. 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  + R flank pain with palpation--- mild Extremities:  No clubbing, cyanosis, edema, or deformity noted with normal full range of motion of all joints.   Neurologic:  alert & oriented X3, strength normal in all extremities, gait normal, and DTRs symmetrical and normal.   Psych:  Oriented X3 and normally interactive.     Impression & Recommendations:  Problem # 1:  LOW BACK PAIN, MILD (ICD-724.2)  His updated medication list for this problem includes:    Aspirin Ec 81 Mg Tbec (Aspirin) .[name]... Take 1 tablet by mouth every morning    Flexeril 10 Mg Tabs (Cyclobenzaprine hcl) .[name]... 1 by mouth three times a day as needed  Orders: T-Lumbar Spine Complete, 5 Views [id number]) T-Thoracic Spine 2 Views (02725DG) UA Dipstick w/o Micro (manual) (81002)  Discussed use of moist heat or ice, modified activities, medications, and stretching/strengthening exercises. Back care instructions given. To be seen in 2 weeks if no improvement; sooner if worsening of symptoms.   Complete Medication List: 1)  Aspirin Ec 81 Mg Tbec  (Aspirin) .... Take 1 tablet by mouth every morning 2)  Zyrtec 10 Mg Tabs (Cetirizine hcl) .... Take one tablet daily 3)  Cialis 20 Mg Tabs (Tadalafil) .... Take one tablet daily 4)  Norvasc 5 Mg Tabs (Amlodipine besylate) .... Take 1 tablet by mouth once a day. 5)  Diovan 160 Mg Tabs (Valsartan) .... Take one tablet twice daily. 6)  Flexeril 10 Mg Tabs (Cyclobenzaprine hcl) .[name].. 1 by mouth three times a day as needed Prescriptions: FLEXERIL 10 MG TABS (CYCLOBENZAPRINE HCL) 1 by mouth three times a day as needed  #30 x 0   Entered and Authorized by:   Loreen Freud DO   Signed by:   Loreen Freud DO on 10/11/2009   Method used:   Electronically to        [facility] [address] [phone]* (retail)       [address]       [place], [place]  [address]       Ph: [phone]       Fax: [phone]   RxID:   [id number]   Laboratory Results   Urine Tests    Routine Urinalysis   Color: yellow Appearance: Clear Glucose: negative   (Normal Range: Negative) Bilirubin: negative   (Normal Range: Negative) Ketone: negative   (Normal Range: Negative) Spec. Gravity: 1.025   (Normal Range: 1.003-1.035) Blood: negative   (Normal Range: Negative) pH: 7.0   (Normal Range: 5.0-8.0) Protein: negative   (Normal Range: Negative) Urobilinogen: 0.2   (Normal Range: 0-1) Nitrite: negative   (Normal Range: Negative) Leukocyte Esterace: negative   (Normal Range: Negative)    Comments: [name] CMA  October 11, 2009 3:24 PM

## 2010-08-18 NOTE — Assessment & Plan Note (Signed)
Summary: pt check///sph  Nurse Visit   Vital Signs:  Patient profile:   55 year old male Height:      73 inches Weight:      336 pounds Pulse rate:   72 / minute Resp:     18 per minute BP sitting:   122 / 86  (left arm)  Vitals Entered By: Jeremy Johann CMA (July 19, 2010 9:11 AM) CC: PT check    Current Medications (verified): 1)  Zyrtec 10 Mg Tabs (Cetirizine Hcl) .... Take One Tablet Daily 2)  Cialis 20 Mg Tabs (Tadalafil) .... Take One Tablet Daily 3)  Norvasc 5 Mg Tabs (Amlodipine Besylate) .... Take 1 Tablet By Mouth Once A Day. 4)  Diovan 160 Mg  Tabs (Valsartan) .... Take One Tablet Twice Daily. 5)  Coumadin 5 Mg Tabs (Warfarin Sodium) .[name].. 1 By Mouth Once Daily 6)  Fish Oil 1200 Mg Caps (Omega-3 Fatty Acids) .... Take 1 Tab Two Times A Day  Allergies (verified): 1)  ! Penicillin G Potassium (Penicillin G Potassium) Laboratory Results   Blood Tests      INR: 3.7   (Normal Range: 0.88-1.12   Therap INR: 2.0-3.5)    Orders Added: 1)  Est. Patient Level I [16109] 2)  Protime [60454UJ]   ANTICOAGULATION RECORD PREVIOUS REGIMEN & LAB RESULTS Anticoagulation Diagnosis:  Deep venous thrombosis on  06/21/2010  Previous INR:  2.7 on  06/21/2010 Previous Coumadin Dose(mg):  unknown on  06/21/2010 Previous Regimen:  no change on  06/21/2010  NEW REGIMEN & LAB RESULTS Current INR: 3.7 Current Coumadin Dose(mg): (5mg ) 1 tab m,w,f,sat (7.5mg ) 1 1/2 tab daily Regimen: hold tonight then resume (7.5 mg) 1 1/2 t,thurs and (5mg ) 1 tab all other days  Provider: lowne Repeat testing in: 1 week  Anticoagulation Visit Questionnaire Coumadin dose missed/changed:  No Abnormal Bleeding Symptoms:  No  Any diet changes including alcohol intake, vegetables or greens since the last visit:  No Any illnesses or hospitalizations since the last visit:  No Any signs of clotting since the last visit (including chest discomfort, dizziness, shortness of breath, arm tingling,  slurred speech, swelling or redness in leg):  No  MEDICATIONS ZYRTEC 10 MG TABS (CETIRIZINE HCL) Take one tablet daily CIALIS 20 MG TABS (TADALAFIL) Take one tablet daily NORVASC 5 MG TABS (AMLODIPINE BESYLATE) Take 1 tablet by mouth once a day. DIOVAN 160 MG  TABS (VALSARTAN) Take one tablet twice daily. COUMADIN 5 MG TABS (WARFARIN SODIUM) 1 by mouth once daily FISH OIL 1200 MG CAPS (OMEGA-3 FATTY ACIDS) Take 1 tab two times a day

## 2010-08-18 NOTE — Assessment & Plan Note (Signed)
Summary: pt check//[facility]  Nurse Visit   Vital Signs:  Patient profile:   55 year old male Height:      73 inches Weight:      331 pounds O2 Sat:      97 % on Room air Temp:     98.6 degrees F oral Pulse rate:   82 / minute BP sitting:   122 / 78  (left arm)  Vitals Entered By: Jeremy Johann CMA (August 09, 2010 9:28 AM)  O2 Flow:  Room air CC: Pt check   Current Medications (verified): 1)  Zyrtec 10 Mg Tabs (Cetirizine Hcl) .... Take One Tablet Daily 2)  Cialis 20 Mg Tabs (Tadalafil) .... Take One Tablet Daily 3)  Norvasc 5 Mg Tabs (Amlodipine Besylate) .... Take 1 Tablet By Mouth Once A Day. 4)  Diovan 160 Mg  Tabs (Valsartan) .... Take One Tablet Twice Daily. 5)  Coumadin 5 Mg Tabs (Warfarin Sodium) .[name].. 1 By Mouth Once Daily 6)  Fish Oil 1200 Mg Caps (Omega-3 Fatty Acids) .... Take 1 Tab Two Times A Day  Allergies (verified): 1)  ! Penicillin G Potassium (Penicillin G Potassium) Laboratory Results   Blood Tests      INR: 2.3   (Normal Range: 0.88-1.12   Therap INR: 2.0-3.5)    Orders Added: 1)  Est. Patient Level I [16109] 2)  Protime [60454UJ]    ANTICOAGULATION RECORD PREVIOUS REGIMEN & LAB RESULTS Anticoagulation Diagnosis:  Deep venous thrombosis on  06/21/2010  Previous INR:  2.7 on  07/26/2010 Previous Coumadin Dose(mg):  1 tab daily except 1 1/2 on Thurs on  07/26/2010 Previous Regimen:  same on  07/26/2010  NEW REGIMEN & LAB RESULTS Current INR: 2.3 Current Coumadin Dose(mg): 1 tab daily except 1 1/2 tab tues,thur Regimen: same  Provider: Laury Axon Repeat testing in: 4 weeks  Anticoagulation Visit Questionnaire Coumadin dose missed/changed:  No Abnormal Bleeding Symptoms:  No  Any diet changes including alcohol intake, vegetables or greens since the last visit:  No Any illnesses or hospitalizations since the last visit:  No Any signs of clotting since the last visit (including chest discomfort, dizziness, shortness of breath, arm tingling,  slurred speech, swelling or redness in leg):  No  MEDICATIONS ZYRTEC 10 MG TABS (CETIRIZINE HCL) Take one tablet daily CIALIS 20 MG TABS (TADALAFIL) Take one tablet daily NORVASC 5 MG TABS (AMLODIPINE BESYLATE) Take 1 tablet by mouth once a day. DIOVAN 160 MG  TABS (VALSARTAN) Take one tablet twice daily. COUMADIN 5 MG TABS (WARFARIN SODIUM) 1 by mouth once daily FISH OIL 1200 MG CAPS (OMEGA-3 FATTY ACIDS) Take 1 tab two times a day

## 2010-08-18 NOTE — Assessment & Plan Note (Signed)
Summary: PT CHECK//[facility]  Nurse Visit   Vital Signs:  Patient profile:   55 year old male Height:      73 inches Weight:      335 pounds Temp:     98.9 degrees F oral Pulse rate:   82 / minute BP sitting:   120 / 86  (left arm)  Vitals Entered By: Jeremy Johann CMA (July 26, 2010 9:34 AM) CC: PT/ INR, flu shot   Current Medications (verified): 1)  Zyrtec 10 Mg Tabs (Cetirizine Hcl) .... Take One Tablet Daily 2)  Cialis 20 Mg Tabs (Tadalafil) .... Take One Tablet Daily 3)  Norvasc 5 Mg Tabs (Amlodipine Besylate) .... Take 1 Tablet By Mouth Once A Day. 4)  Diovan 160 Mg  Tabs (Valsartan) .... Take One Tablet Twice Daily. 5)  Coumadin 5 Mg Tabs (Warfarin Sodium) .[name].. 1 By Mouth Once Daily 6)  Fish Oil 1200 Mg Caps (Omega-3 Fatty Acids) .... Take 1 Tab Two Times A Day  Allergies (verified): 1)  ! Penicillin G Potassium (Penicillin G Potassium) Laboratory Results   Blood Tests      INR: 2.7   (Normal Range: 0.88-1.12   Therap INR: 2.0-3.5) Comments: Left Pt detail message with instruction.......Marland KitchenFelecia Deloach CMA  July 26, 2010 10:44 AM     Immunizations Administered:  Influenza Vaccine # 1:    Vaccine Type: Fluvax Non-MCR    Site: left deltoid    Mfr: Sanofi Pasteur    Dose: 0.5 ml    Route: IM    Given by: Jeremy Johann CMA    Exp. Date: 01/14/2011    Lot #: [id number]    VIS given: 02/08/10 version given July 26, 2010.  Flu Vaccine Consent Questions:    Do you have a history of severe allergic reactions to this vaccine? no    Any prior history of allergic reactions to egg and/or gelatin? no    Do you have a sensitivity to the preservative Thimersol? no    Do you have a past history of Guillan-Barre Syndrome? no    Do you currently have an acute febrile illness? no    Have you ever had a severe reaction to latex? no    Vaccine information given and explained to patient? yes  Orders Added: 1)  Influenza Vaccine NON MCR [00028] 2)  Admin 1st Vaccine  [90471] 3)  Est. Patient Level I [45409] 4)  Protime [81191YN]    ANTICOAGULATION RECORD PREVIOUS REGIMEN & LAB RESULTS Anticoagulation Diagnosis:  Deep venous thrombosis on  06/21/2010  Previous INR:  3.7 on  07/19/2010 Previous Coumadin Dose(mg):  (5mg ) 1 tab m,w,f,sat (7.5mg ) 1 1/2 tab daily on  07/19/2010 Previous Regimen:  hold tonight then resume (7.5 mg) 1 1/2 t,thurs and (5mg ) 1 tab all other days on  07/19/2010  NEW REGIMEN & LAB RESULTS Current INR: 2.7 Current Coumadin Dose(mg): 1 tab daily except 1 1/2 on Thurs Regimen: same  Provider: lowne Repeat testing in: 2 weeks  Anticoagulation Visit Questionnaire Coumadin dose missed/changed:  No Abnormal Bleeding Symptoms:  No  Any diet changes including alcohol intake, vegetables or greens since the last visit:  No Any illnesses or hospitalizations since the last visit:  No Any signs of clotting since the last visit (including chest discomfort, dizziness, shortness of breath, arm tingling, slurred speech, swelling or redness in leg):  No  MEDICATIONS ZYRTEC 10 MG TABS (CETIRIZINE HCL) Take one tablet daily CIALIS 20 MG TABS (TADALAFIL) Take one tablet daily  NORVASC 5 MG TABS (AMLODIPINE BESYLATE) Take 1 tablet by mouth once a day. DIOVAN 160 MG  TABS (VALSARTAN) Take one tablet twice daily. COUMADIN 5 MG TABS (WARFARIN SODIUM) 1 by mouth once daily FISH OIL 1200 MG CAPS (OMEGA-3 FATTY ACIDS) Take 1 tab two times a day

## 2010-08-23 ENCOUNTER — Ambulatory Visit (INDEPENDENT_AMBULATORY_CARE_PROVIDER_SITE_OTHER): Payer: 59

## 2010-08-23 ENCOUNTER — Encounter: Payer: Self-pay | Admitting: Family Medicine

## 2010-08-23 DIAGNOSIS — I82409 Acute embolism and thrombosis of unspecified deep veins of unspecified lower extremity: Secondary | ICD-10-CM

## 2010-08-23 DIAGNOSIS — Z7901 Long term (current) use of anticoagulants: Secondary | ICD-10-CM

## 2010-09-12 ENCOUNTER — Other Ambulatory Visit: Payer: Self-pay | Admitting: Family Medicine

## 2010-09-12 ENCOUNTER — Encounter: Payer: Self-pay | Admitting: Family Medicine

## 2010-09-12 ENCOUNTER — Encounter (INDEPENDENT_AMBULATORY_CARE_PROVIDER_SITE_OTHER): Payer: 59 | Admitting: Family Medicine

## 2010-09-12 ENCOUNTER — Encounter (INDEPENDENT_AMBULATORY_CARE_PROVIDER_SITE_OTHER): Payer: Self-pay | Admitting: *Deleted

## 2010-09-12 DIAGNOSIS — I10 Essential (primary) hypertension: Secondary | ICD-10-CM

## 2010-09-12 DIAGNOSIS — M109 Gout, unspecified: Secondary | ICD-10-CM

## 2010-09-12 DIAGNOSIS — Z86718 Personal history of other venous thrombosis and embolism: Secondary | ICD-10-CM

## 2010-09-12 DIAGNOSIS — I82409 Acute embolism and thrombosis of unspecified deep veins of unspecified lower extremity: Secondary | ICD-10-CM

## 2010-09-12 DIAGNOSIS — F528 Other sexual dysfunction not due to a substance or known physiological condition: Secondary | ICD-10-CM

## 2010-09-12 DIAGNOSIS — Z0000 Encounter for general adult medical examination without abnormal findings: Secondary | ICD-10-CM

## 2010-09-12 DIAGNOSIS — Z136 Encounter for screening for cardiovascular disorders: Secondary | ICD-10-CM

## 2010-09-12 DIAGNOSIS — Z23 Encounter for immunization: Secondary | ICD-10-CM

## 2010-09-12 LAB — PROTIME-INR
INR: 2.7 ratio — ABNORMAL HIGH (ref 0.8–1.0)
Prothrombin Time: 27.8 s — ABNORMAL HIGH (ref 10.2–12.4)

## 2010-09-12 LAB — HEPATIC FUNCTION PANEL
AST: 15 U/L (ref 0–37)
Albumin: 3.6 g/dL (ref 3.5–5.2)
Alkaline Phosphatase: 39 U/L (ref 39–117)
Bilirubin, Direct: 0.1 mg/dL (ref 0.0–0.3)

## 2010-09-12 LAB — CBC WITH DIFFERENTIAL/PLATELET
Basophils Absolute: 0 10*3/uL (ref 0.0–0.1)
Basophils Relative: 0.5 % (ref 0.0–3.0)
Eosinophils Absolute: 0.1 10*3/uL (ref 0.0–0.7)
Hemoglobin: 12.8 g/dL — ABNORMAL LOW (ref 13.0–17.0)
Lymphocytes Relative: 28.4 % (ref 12.0–46.0)
Monocytes Relative: 7.8 % (ref 3.0–12.0)
Neutro Abs: 4.2 10*3/uL (ref 1.4–7.7)
Neutrophils Relative %: 61.8 % (ref 43.0–77.0)
RBC: 4.86 Mil/uL (ref 4.22–5.81)

## 2010-09-12 LAB — BASIC METABOLIC PANEL
Calcium: 9 mg/dL (ref 8.4–10.5)
Creatinine, Ser: 0.9 mg/dL (ref 0.4–1.5)
GFR: 109.42 mL/min (ref >60.00)
Sodium: 140 mEq/L (ref 135–145)

## 2010-09-12 LAB — LIPID PANEL
HDL: 27.6 mg/dL — ABNORMAL LOW (ref >39.00)
LDL Cholesterol: 114 mg/dL — ABNORMAL HIGH (ref 0–99)
Total CHOL/HDL Ratio: 6
Triglycerides: 76 mg/dL (ref 0.0–149.0)
VLDL: 15.2 mg/dL (ref 0.0–40.0)

## 2010-09-15 ENCOUNTER — Ambulatory Visit: Payer: 59 | Admitting: Hematology & Oncology

## 2010-09-19 LAB — CONVERTED CEMR LAB
Sex Hormone Binding: 21 nmol/L (ref 13–71)
Testosterone Free: 49.3 pg/mL (ref 47.0–244.0)
Testosterone: 204.08 ng/dL — ABNORMAL LOW (ref 250–890)

## 2010-09-22 ENCOUNTER — Ambulatory Visit (HOSPITAL_BASED_OUTPATIENT_CLINIC_OR_DEPARTMENT_OTHER)
Admission: RE | Admit: 2010-09-22 | Discharge: 2010-09-22 | Disposition: A | Discharge status: Home / Self Care | Payer: 59 | Source: Ambulatory Visit | Attending: Family Medicine | Admitting: Family Medicine

## 2010-09-22 ENCOUNTER — Encounter: Payer: Self-pay | Admitting: Family Medicine

## 2010-09-22 ENCOUNTER — Other Ambulatory Visit: Payer: Self-pay | Admitting: Family Medicine

## 2010-09-22 DIAGNOSIS — R05 Cough: Secondary | ICD-10-CM | POA: Insufficient documentation

## 2010-09-22 DIAGNOSIS — J158 Pneumonia due to other specified bacteria: Secondary | ICD-10-CM

## 2010-09-22 DIAGNOSIS — R059 Cough, unspecified: Secondary | ICD-10-CM | POA: Insufficient documentation

## 2010-09-22 NOTE — Letter (Signed)
Summary: [facility]: Immunoassay Fecal Occult Blood (iFOB) Order Form  [facility] [place]  [address] [place], [place] [address]   Phone: [phone]  Fax: [phone]      [facility]: Immunoassay Fecal Occult Blood (iFOB) Order Form   September 12, 2010 MRN: [id number]   PHILOPATEER STRINE Mar 09, 1956   Physicican Name:____Dr.Lowne____  Diagnosis Code:____V56.71_______     Almeta Monas CMA (AAMA)

## 2010-09-22 NOTE — Assessment & Plan Note (Signed)
Summary: physical/fasting/kn   Vital Signs:  Patient profile:   55 year old male Height:      73 inches Weight:      332.6 pounds BMI:     44.04 Pulse rate:   85 / minute Pulse rhythm:   regular BP sitting:   142 / 84  (right arm) Cuff size:   large  Vitals Entered By: Almeta Monas CMA Duncan Dull) (September 12, 2010 8:36 AM) CC: CPX/FASTING----recently completed Zpak for pneumonia   History of Present Illness: Pt here for cpe and labs.  Pt recently found out when he was 55 years old he had a clot in his leg. He thinks it was the same one.   Pt recently finished a zpak for pneumonia --he was seen in an urgent care.  He is still coughing although symptoms are slightly better.  Preventive Screening-Counseling & Management  Alcohol-Tobacco     Alcohol drinks/day: <1     Alcohol type: mixed--rum     Smoking Status: never     Passive Smoke Exposure: no  Caffeine-Diet-Exercise     Caffeine use/day: 2     Diet Comments: needs improvement     Does Patient Exercise: no     Exercise Counseling: to improve exercise regimen  Hep-HIV-STD-Contraception     HIV Risk: no     Dental Visit-last 6 months yes     Dental Care Counseling: to seek dental care; no dental care within six months  Safety-Violence-Falls     Seat Belt Use: 100      Sexual History:  currently monogamous and married.        Drug Use:  no.    Current Medications (verified): 1)  Zyrtec 10 Mg Tabs (Cetirizine Hcl) .... Take One Tablet Daily 2)  Cialis 20 Mg Tabs (Tadalafil) .... Take One Tablet Daily 3)  Norvasc 5 Mg Tabs (Amlodipine Besylate) .... Take 1 Tablet By Mouth Once A Day. 4)  Diovan 160 Mg  Tabs (Valsartan) .... Take One Tablet Twice Daily. 5)  Coumadin 5 Mg Tabs (Warfarin Sodium) .[name].. 1 By Mouth Once Daily 6)  Fish Oil 1200 Mg Caps (Omega-3 Fatty Acids) .... Take 1 Tab Two Times A Day 7)  Tessalon 200 Mg Caps (Benzonatate) .[name].. 1 By Mouth Three Times A Day As Needed 8)  Zithromax Z-Pak 250 Mg Tabs  (Azithromycin) .... As Directed  Allergies (verified): 1)  ! 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 Never Smoked Alcohol use-yes Drug use-no Regular exercise-no  Risk Factors: Alcohol Use: <1 (09/12/2010) Caffeine Use: 2 (09/12/2010) Diet: needs improvement (09/12/2010) Exercise: no (09/12/2010)  Risk Factors: Smoking Status: never (09/12/2010) Passive Smoke Exposure: no (09/12/2010)  Past Medical History: Hypertension Allergic rhinitis Current Problems:  INFLUENZA WITH PNEUMONIA (ICD-487.0) PNEUMONIA DUE TO OTHER SPECIFIED BACTERIA (ICD-482.89) ALLERGIC RHINITIS (ICD-477.9) ERECTILE DYSFUNCTION (ICD-302.72) HYPERTENSION (ICD-401.9) Gout DVT, hx of  1964 and 2011  Family History: Reviewed history from 01/14/2008 and no changes required. Alzheimer Family History Hypertension Family History of Alcoholism/Addiction--F  Social History: Reviewed history from 12/20/2006 and no changes required. 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 Eyes:  Denies blurring, discharge, double vision, eye irritation, eye pain, halos, itching, light sensitivity, red eye, vision loss-1 eye, and vision loss-both eyes; ophtho q 1y. ENT:  Denies decreased hearing, difficulty swallowing, ear discharge, earache, hoarseness, nasal congestion, nosebleeds, postnasal drainage, ringing in ears, sinus pressure, and sore throat. CV:  Denies bluish  discoloration of lips or nails, chest pain or discomfort, difficulty breathing at night, difficulty breathing while lying down, fainting, fatigue, leg cramps with exertion, lightheadness, near fainting, palpitations, shortness of breath with exertion, swelling of feet, swelling of hands, and weight gain. Resp:  Denies chest discomfort, chest pain with inspiration, cough, coughing up blood, excessive snoring, hypersomnolence, morning headaches, pleuritic, shortness of breath, sputum productive, and wheezing. GI:  Denies abdominal pain, bloody stools, change in bowel habits, constipation, dark tarry stools, diarrhea, excessive appetite, gas, hemorrhoids, indigestion, loss of appetite, nausea, vomiting, vomiting blood, and yellowish skin color. GU:  Denies decreased libido, discharge, dysuria, erectile dysfunction, genital sores, hematuria, incontinence, nocturia, urinary frequency, and urinary hesitancy; [facility]. MS:  Denies joint pain, joint redness, joint swelling, loss of strength, low back pain, mid back pain, muscle aches, muscle , cramps, muscle weakness, stiffness, and thoracic pain. Derm:  Denies changes in color of skin, changes in nail beds, dryness, excessive perspiration, flushing, hair loss, insect bite(s), itching, lesion(s), poor wound healing, and rash. Neuro:  Denies brief paralysis, difficulty with concentration, disturbances in coordination, falling down, headaches, inability to speak, memory loss, numbness, poor balance, seizures, sensation of room spinning, tingling, tremors, visual disturbances, and weakness. Psych:  Denies alternate hallucination ( auditory/visual), anxiety, depression, easily angered, easily tearful, irritability, mental problems, panic attacks, sense of great danger, suicidal thoughts/plans, thoughts of violence, unusual visions or sounds, and thoughts /plans of harming others. Endo:  Denies cold intolerance, excessive hunger, excessive thirst, excessive  urination, heat intolerance, polyuria, and weight change. Heme:  Denies abnormal bruising, bleeding, enlarge lymph nodes, fevers, pallor, and skin discoloration. Allergy:  Denies hives or rash, itching eyes, persistent infections, seasonal allergies, and sneezing.  Physical Exam  General:  Well-developed,well-nourished,in no acute distress; alert,appropriate and cooperative throughout examinationoverweight-appearing.   Head:  Normocephalic and atraumatic without obvious abnormalities. No apparent alopecia or balding. Eyes:  pupils equal, pupils round, pupils reactive to light, and no injection.   Ears:  External ear exam shows no significant lesions or deformities.  Otoscopic examination reveals clear canals, tympanic membranes are intact bilaterally without bulging, retraction, inflammation or discharge. Hearing is grossly normal bilaterally. Nose:  External nasal examination shows no deformity or inflammation. Nasal mucosa are pink and moist without lesions or exudates. Mouth:  Oral mucosa and oropharynx without lesions or exudates.  Teeth in good repair. Neck:  No deformities, masses, or tenderness noted.no carotid bruits.   Chest Wall:  No deformities, masses, tenderness or gynecomastia noted. Lungs:  Normal respiratory effort, chest expands symmetrically. Lungs are clear to auscultation, no crackles or wheezes. Heart:  normal rate and no murmur.   Abdomen:  Bowel sounds positive,abdomen soft and non-tender without masses, organomegaly or hernias noted. Rectal:  urology Genitalia:  urololgy Prostate:  urology Msk:  normal ROM, no joint tenderness, no joint swelling, no joint warmth, no redness over joints, no joint deformities, no joint instability, no crepitation, and no muscle atrophy.   Pulses:  R and L carotid,radial,femoral,dorsalis pedis and posterior tibial pulses are full and equal bilaterally Extremities:  No clubbing, cyanosis, edema, or deformity noted with normal  full range of  motion of all joints.   Neurologic:  alert & oriented X3, cranial nerves II-XII intact, strength normal in all extremities, and gait normal.   Skin:  Intact without suspicious lesions or rashes Cervical Nodes:  No lymphadenopathy noted Psych:  Cognition and judgment appear intact. Alert and cooperative with normal attention span and concentration. No apparent delusions, illusions, hallucinations   Impression & Recommendations:  Problem # 1:  PREVENTIVE HEALTH CARE (ICD-V70.0) ghm utd  Orders: Venipuncture (19147) TLB-Lipid Panel (80061-LIPID) TLB-BMP (Basic Metabolic Panel-BMET) (80048-METABOL) TLB-CBC Platelet - w/Differential (85025-CBCD) TLB-Hepatic/Liver Function Pnl (80076-HEPATIC) TLB-TSH (Thyroid Stimulating Hormone) (84443-TSH) TLB-Uric Acid, Blood (84550-URIC) TLB-PT (Protime) (85610-PTP) TLB-PSA (Prostate Specific Antigen) (84153-PSA) T- * Misc. Laboratory test [phone]) Specimen Handling (21308) EKG w/ Interpretation (93000)  Reviewed preventive care protocols, scheduled due services, and updated immunizations.  Problem # 2:  DVT, HX OF (ICD-V12.51)  His updated medication list for this problem includes:    Coumadin 5 Mg Tabs (Warfarin sodium) .[name]... 1 by mouth once daily  Orders: Venipuncture (65784) TLB-Lipid Panel (80061-LIPID) TLB-BMP (Basic Metabolic Panel-BMET) (80048-METABOL) TLB-CBC Platelet - w/Differential (85025-CBCD) TLB-Hepatic/Liver Function Pnl (80076-HEPATIC) TLB-TSH (Thyroid Stimulating Hormone) (84443-TSH) TLB-Uric Acid, Blood (84550-URIC) TLB-PT (Protime) (85610-PTP) TLB-PSA (Prostate Specific Antigen) (84153-PSA) T- * Misc. Laboratory test [phone]) Specimen Handling (52841) EKG w/ Interpretation (93000)  Reviewed the following: INR: 2.8 (08/23/2010)    Next Protime: 4 weeks (dated on 08/23/2010)  Problem # 3:  GOUT (ICD-274.9) Assessment: Improved  check uric acid  Orders: Venipuncture (32440) TLB-Lipid Panel (80061-LIPID) TLB-BMP  (Basic Metabolic Panel-BMET) (80048-METABOL) TLB-CBC Platelet - w/Differential (85025-CBCD) TLB-Hepatic/Liver Function Pnl (80076-HEPATIC) TLB-TSH (Thyroid Stimulating Hormone) (84443-TSH) TLB-Uric Acid, Blood (84550-URIC) TLB-PT (Protime) (85610-PTP) TLB-PSA (Prostate Specific Antigen) (84153-PSA) T- * Misc. Laboratory test [phone]) EKG w/ Interpretation (93000)  Problem # 4:  HYPERTENSION (ICD-401.9)  His updated medication list for this problem includes:    Norvasc 5 Mg Tabs (Amlodipine besylate) .[name]... Take 1 tablet by mouth once a day.    Diovan 160 Mg Tabs (Valsartan) .[name]... Take one tablet twice daily.  Orders: Venipuncture (53664) TLB-Lipid Panel (80061-LIPID) TLB-BMP (Basic Metabolic Panel-BMET) (80048-METABOL) TLB-CBC Platelet - w/Differential (85025-CBCD) TLB-Hepatic/Liver Function Pnl (80076-HEPATIC) TLB-TSH (Thyroid Stimulating Hormone) (84443-TSH) TLB-Uric Acid, Blood (84550-URIC) TLB-PT (Protime) (85610-PTP) TLB-PSA (Prostate Specific Antigen) (84153-PSA) T- * Misc. Laboratory test [phone]) EKG w/ Interpretation (93000)  BP today: 142/84 Prior BP: 122/80 (08/23/2010)  Labs Reviewed: K+: 3.9 (09/06/2009) Creat: : 1.0 (09/06/2009)   Chol: 150 (09/06/2009)   HDL: 36.50 (09/06/2009)   LDL: 96 (09/06/2009)   TG: 86.0 (09/06/2009)  Problem # 5:  ERECTILE DYSFUNCTION (ICD-302.72)  His updated medication list for this problem includes:    Cialis 20 Mg Tabs (Tadalafil) .[name]... Take one tablet daily  Orders: Venipuncture (42595) TLB-Lipid Panel (80061-LIPID) TLB-BMP (Basic Metabolic Panel-BMET) (80048-METABOL) TLB-CBC Platelet - w/Differential (85025-CBCD) TLB-Hepatic/Liver Function Pnl (80076-HEPATIC) TLB-TSH (Thyroid Stimulating Hormone) (84443-TSH) TLB-Uric Acid, Blood (84550-URIC) TLB-PT (Protime) (85610-PTP) TLB-PSA (Prostate Specific Antigen) (84153-PSA) T- * Misc. Laboratory test [phone]) Specimen Handling (64332) Specimen Handling (95188)  Discussed proper  use of medications, as well as side effects.   Problem # 6:  PNEUMONIA DUE TO OTHER SPECIFIED BACTERIA (ICD-482.89)  His updated medication list for this problem includes:    Zithromax Z-pak 250 Mg Tabs (Azithromycin) .[name]... As directed  Orders: T-2 View CXR (71020TC)  Instructed patient to complete antibiotics, and call for worsened shortness of breath or new symptoms.   Complete Medication List: 1)  Zyrtec 10 Mg Tabs (Cetirizine  hcl) .... Take one tablet daily 2)  Cialis 20 Mg Tabs (Tadalafil) .... Take one tablet daily 3)  Norvasc 5 Mg Tabs (Amlodipine besylate) .... Take 1 tablet by mouth once a day. 4)  Diovan 160 Mg Tabs (Valsartan) .... Take one tablet twice daily. 5)  Coumadin 5 Mg Tabs (Warfarin sodium) .[name].. 1 by mouth once daily 6)  Fish Oil 1200 Mg Caps (Omega-3 fatty acids) .... Take 1 tab two times a day 7)  Tessalon 200 Mg Caps (Benzonatate) .[name].. 1 by mouth three times a day as needed 8)  Zithromax Z-pak 250 Mg Tabs (Azithromycin) .... As directed  Other Orders: Pneumococcal Vaccine (16109) Admin 1st Vaccine (60454) Hematology Referral (Hematology)  Patient Instructions: 1)  get cxr done next week  Prescriptions: ZITHROMAX Z-PAK 250 MG TABS (AZITHROMYCIN) as directed  #1 x 0   Entered and Authorized by:   Loreen Freud DO   Signed by:   Loreen Freud DO on 09/12/2010   Method used:   Electronically to        [id number] (retail)       [address]       [place], [place]  [address]       Ph: [phone]       Fax: [phone]   RxID:   [id number] TESSALON 200 MG CAPS (BENZONATATE) 1 by mouth three times a day as needed  #30 x 0   Entered and Authorized by:   Loreen Freud DO   Signed by:   Loreen Freud DO on 09/12/2010   Method used:   Electronically to        [id number]* (retail)       [address]       [place], [place]  [address]       Ph:  [phone]       Fax: [phone]   RxID:   [id number]    Orders Added: 1)  Venipuncture [57322] 2)  TLB-Lipid Panel [80061-LIPID] 3)  TLB-BMP (Basic Metabolic Panel-BMET) [80048-METABOL] 4)  TLB-CBC Platelet - w/Differential [85025-CBCD] 5)  TLB-Hepatic/Liver Function Pnl [80076-HEPATIC] 6)  TLB-TSH (Thyroid Stimulating Hormone) [84443-TSH] 7)  TLB-Uric Acid, Blood [84550-URIC] 8)  TLB-PT (Protime) [85610-PTP] 9)  TLB-PSA (Prostate Specific Antigen) [84153-PSA] 10)  T- * Misc. Laboratory test [99999] 11)  T-2 View CXR [71020TC] 12)  Pneumococcal Vaccine [90732] 13)  Admin 1st Vaccine [90471] 14)  Specimen Handling [99000] 15)  Specimen Handling [99000] 16)  Hematology Referral [Hematology] 17)  Est. Patient 40-64 years [99396] 18)  EKG w/ Interpretation [93000]   Immunizations Administered:  Pneumonia Vaccine:    Vaccine Type: Pneumovax    Site: right deltoid    Mfr: Merck    Dose: 0.5 ml    Route: IM    Given by: Almeta Monas CMA (AAMA)    Exp. Date: 11/16/2011    Lot #: [id number]    VIS given: 06/21/09 version given September 12, 2010.   Immunizations Administered:  Pneumonia Vaccine:    Vaccine Type: Pneumovax    Site: right deltoid    Mfr: Merck    Dose: 0.5 ml    Route: IM    Given by: Almeta Monas CMA (AAMA)    Exp. Date: 11/16/2011    Lot #: [id number]    VIS given: 06/21/09 version given September 12, 2010.  Last  Flu Vaccine:  Fluvax Non-MCR (07/26/2010 9:17:17 AM) Flu Vaccine Result Date:  07/26/2010 Flu Vaccine Result:  given Flu Vaccine Next Due:  1 yr    EKG  Procedure date:  09/12/2010  Findings:      Non-specific ST-T wave changes noted.

## 2010-09-30 LAB — CBC
Platelets: 268 10*3/uL (ref 150–400)
RDW: 15.8 % — ABNORMAL HIGH (ref 11.5–15.5)
WBC: 8.4 10*3/uL (ref 4.0–10.5)

## 2010-09-30 LAB — DIFFERENTIAL
Basophils Absolute: 0 10*3/uL (ref 0.0–0.1)
Basophils Relative: 0 % (ref 0–1)
Eosinophils Relative: 2 % (ref 0–5)
Lymphocytes Relative: 36 % (ref 12–46)
Neutro Abs: 4.5 10*3/uL (ref 1.7–7.7)

## 2010-09-30 LAB — BASIC METABOLIC PANEL
BUN: 12 mg/dL (ref 6–23)
Calcium: 8.7 mg/dL (ref 8.4–10.5)
Creatinine, Ser: 0.97 mg/dL (ref 0.4–1.5)
GFR calc non Af Amer: >60 mL/min (ref >60)

## 2010-09-30 LAB — D-DIMER, QUANTITATIVE: D-Dimer, Quant: 0.82 ug/mL-FEU — ABNORMAL HIGH (ref 0.00–0.48)

## 2010-10-04 ENCOUNTER — Telehealth: Payer: Self-pay | Admitting: Family Medicine

## 2010-10-05 ENCOUNTER — Telehealth: Payer: Self-pay | Admitting: *Deleted

## 2010-10-05 NOTE — Telephone Encounter (Signed)
Spoke with Dalia at the Dentist office and she stated patient is scheduled for a cleaning appt on 11/01/10 at 9 am. They needed documentation stating it was ok to clean his teeth, and directions on coumadin and whether he needs to be off of it for a few days prior to cleaning. Fax # [phone]. Please advise.... Marland KitchenKP

## 2010-10-05 NOTE — Telephone Encounter (Signed)
Per Selena Batten already handle.Daniel Powers CMA

## 2010-10-05 NOTE — Telephone Encounter (Signed)
Ok to do teeth cleaning without stopping coumadin

## 2010-10-05 NOTE — Telephone Encounter (Signed)
Spoke with Daniel Powers and advised her there was no need to stop coumadin just for a cleaning. I advised if they are going to do anything additional then they will need to let us know so we can advise if discontinuation of medication is needed. She voiced understanding.

## 2010-10-05 NOTE — Telephone Encounter (Signed)
Dr Laury Axon has been working on this and made recommendations.  Will defer to her.

## 2010-10-06 NOTE — Telephone Encounter (Signed)
Didn't we already deal with this?

## 2010-10-08 ENCOUNTER — Other Ambulatory Visit: Payer: Self-pay | Admitting: Family Medicine

## 2010-10-10 ENCOUNTER — Other Ambulatory Visit: Payer: Self-pay | Admitting: Hematology & Oncology

## 2010-10-10 ENCOUNTER — Other Ambulatory Visit: Payer: Self-pay | Admitting: Family Medicine

## 2010-10-10 ENCOUNTER — Ambulatory Visit (HOSPITAL_BASED_OUTPATIENT_CLINIC_OR_DEPARTMENT_OTHER): Payer: 59 | Admitting: Hematology & Oncology

## 2010-10-10 DIAGNOSIS — I743 Embolism and thrombosis of arteries of the lower extremities: Secondary | ICD-10-CM

## 2010-10-10 DIAGNOSIS — I82819 Embolism and thrombosis of superficial veins of unspecified lower extremities: Secondary | ICD-10-CM

## 2010-10-10 DIAGNOSIS — Z7901 Long term (current) use of anticoagulants: Secondary | ICD-10-CM

## 2010-10-10 LAB — CBC WITH DIFFERENTIAL (CANCER CENTER ONLY)
BASO#: 0 10*3/uL (ref 0.0–0.2)
EOS%: 1.4 % (ref 0.0–7.0)
HGB: 12.7 g/dL — ABNORMAL LOW (ref 13.0–17.1)
LYMPH#: 2 10*3/uL (ref 0.9–3.3)
MCHC: 32.4 g/dL (ref 32.0–35.9)
NEUT#: 3.8 10*3/uL (ref 1.5–6.5)
Platelets: 287 10*3/uL (ref 145–400)
RBC: 5.1 10*6/uL (ref 4.20–5.70)

## 2010-10-11 MED ORDER — VALSARTAN 160 MG PO TABS: 160.0000 mg | ORAL_TABLET | Freq: Two times a day (BID) | ORAL | Status: DC

## 2010-10-14 ENCOUNTER — Other Ambulatory Visit: Payer: 59

## 2010-10-14 DIAGNOSIS — Z1211 Encounter for screening for malignant neoplasm of colon: Secondary | ICD-10-CM

## 2010-11-08 ENCOUNTER — Other Ambulatory Visit: Payer: Self-pay | Admitting: Family Medicine

## 2010-12-02 ENCOUNTER — Other Ambulatory Visit: Payer: Self-pay | Admitting: Family Medicine

## 2010-12-02 NOTE — Telephone Encounter (Signed)
Pt needs PT check if he has not had one in a month

## 2010-12-02 NOTE — Op Note (Signed)
   [name], Daniel Powers                             ACCOUNT NO.:  [id number]   MEDICAL RECORD NO.:  [id number]                   PATIENT TYPE:  AMB   LOCATION:  ENDO                                 FACILITY:  [facility]   PHYSICIAN:  John C. Madilyn Fireman, M.D.                 DATE OF BIRTH:  1956/01/28   DATE OF PROCEDURE:  06/20/2002  DATE OF DISCHARGE:                                 OPERATIVE REPORT   PROCEDURE:  Colonoscopy.   INDICATION FOR PROCEDURE:  Intermittent rectal bleeding in a 55 year old  patient with no prior colon screening.   DESCRIPTION OF PROCEDURE:  The patient was placed in the left lateral  decubitus position and placed on the pulse monitor with continuous low-flow  oxygen delivered by nasal cannula.  He was sedated with 75 mcg IV fentanyl  and 6 mg IV Versed.  The Olympus video colonoscope was inserted into the  rectum and advanced to the cecum, confirmed by transillumination at  McBurney's point and visualization of the ileocecal valve and appendiceal  orifice.  The prep was excellent.  The cecum, ascending, transverse,  descending, and sigmoid colon all appeared normal with no masses, polyps,  diverticula, or other mucosal abnormalities.  The rectum likewise appeared  normal, and retroflex view of the anus revealed small internal hemorrhoids.  The colonoscope was then withdrawn and the patient returned to the recovery  room in stable condition.  He tolerated the procedure well, and there were  no immediate complications.   IMPRESSION:  Internal hemorrhoids, otherwise normal colonoscopy.   PLAN:  Treat hemorrhoids symptomatically.                                                John C. Madilyn Fireman, M.D.    J[facility]/MEDQ  D:  06/20/2002  T:  06/20/2002  Job:  [id number]   cc:   Leanne Chang, M.D.  [address]  [place]  [place] [address]  Fax: [phone]

## 2010-12-02 NOTE — Telephone Encounter (Signed)
I do not see a recent PT/INR for the pt. Please advise.

## 2010-12-02 NOTE — Telephone Encounter (Signed)
Left message at home to call the office.

## 2010-12-05 NOTE — Telephone Encounter (Signed)
Left message on cell and home to call the office.

## 2010-12-05 NOTE — Telephone Encounter (Signed)
Pt return call Left message to call office to advise Pt that PT/INR appt is needed in order to continue to fill med.

## 2010-12-06 ENCOUNTER — Ambulatory Visit (INDEPENDENT_AMBULATORY_CARE_PROVIDER_SITE_OTHER): Payer: 59 | Admitting: *Deleted

## 2010-12-06 DIAGNOSIS — I82409 Acute embolism and thrombosis of unspecified deep veins of unspecified lower extremity: Secondary | ICD-10-CM

## 2010-12-06 DIAGNOSIS — Z7901 Long term (current) use of anticoagulants: Secondary | ICD-10-CM

## 2010-12-06 LAB — POCT INR: INR: 2.9

## 2010-12-06 NOTE — Patient Instructions (Addendum)
Return to office in 1 month for PT/INR  Current dose: 1 tab daily except 1 1/2 tab on tues,thurs

## 2010-12-07 MED ORDER — WARFARIN SODIUM 5 MG PO TABS: 5.0000 mg | ORAL_TABLET | ORAL | Status: DC

## 2011-01-05 ENCOUNTER — Ambulatory Visit (INDEPENDENT_AMBULATORY_CARE_PROVIDER_SITE_OTHER): Payer: 59 | Admitting: *Deleted

## 2011-01-05 DIAGNOSIS — Z7901 Long term (current) use of anticoagulants: Secondary | ICD-10-CM

## 2011-01-05 DIAGNOSIS — I82409 Acute embolism and thrombosis of unspecified deep veins of unspecified lower extremity: Secondary | ICD-10-CM

## 2011-01-05 LAB — POCT INR: INR: 2.8

## 2011-01-05 NOTE — Patient Instructions (Addendum)
Return to office in 4 weeks for PT/INR  Continue current dose: 1 tab daily except 1 1/2 tab on tues,thurs   Remember no long car rides get out every 2 hours at least to walk around and stretch legs

## 2011-01-23 ENCOUNTER — Telehealth: Payer: Self-pay | Admitting: Family Medicine

## 2011-01-23 NOTE — Telephone Encounter (Signed)
Pt called says he has been having some knee pain and swollen thinks it may be gout flare and wanted to know if he could come in today. Informed pt no openings today but does think he needs to be seen to also rule out dvt since he is on coumadin for this condition and not just limit this to gout since he will be leaving to go out of town tomorrow. Recommended pt go to UC to rule out all possibilities of knee pain. Pt ok'd information and will go to UC.

## 2011-02-02 ENCOUNTER — Other Ambulatory Visit: Payer: Self-pay | Admitting: Family Medicine

## 2011-02-02 NOTE — Telephone Encounter (Signed)
Was noted that pt requests 90 day supply. Sent refill.

## 2011-02-07 ENCOUNTER — Ambulatory Visit (INDEPENDENT_AMBULATORY_CARE_PROVIDER_SITE_OTHER): Payer: 59 | Admitting: *Deleted

## 2011-02-07 DIAGNOSIS — I82409 Acute embolism and thrombosis of unspecified deep veins of unspecified lower extremity: Secondary | ICD-10-CM

## 2011-02-07 DIAGNOSIS — Z7901 Long term (current) use of anticoagulants: Secondary | ICD-10-CM

## 2011-02-07 LAB — POCT INR: INR: 3

## 2011-02-07 NOTE — Patient Instructions (Signed)
Return to office in 4 weeks for PT/INR  Continue current dose: 1 tab daily except 1 1/2 tab on tues,thurs  

## 2011-03-09 ENCOUNTER — Ambulatory Visit (INDEPENDENT_AMBULATORY_CARE_PROVIDER_SITE_OTHER): Payer: 59 | Admitting: *Deleted

## 2011-03-09 DIAGNOSIS — Z7901 Long term (current) use of anticoagulants: Secondary | ICD-10-CM

## 2011-03-09 DIAGNOSIS — I82409 Acute embolism and thrombosis of unspecified deep veins of unspecified lower extremity: Secondary | ICD-10-CM

## 2011-03-09 NOTE — Patient Instructions (Signed)
Per Dr. Laury Axon take 5mg  today then resume current dose and recheck in 2 weeks.

## 2011-03-23 ENCOUNTER — Ambulatory Visit (INDEPENDENT_AMBULATORY_CARE_PROVIDER_SITE_OTHER): Payer: 59 | Admitting: *Deleted

## 2011-03-23 DIAGNOSIS — Z7901 Long term (current) use of anticoagulants: Secondary | ICD-10-CM

## 2011-03-23 DIAGNOSIS — I82409 Acute embolism and thrombosis of unspecified deep veins of unspecified lower extremity: Secondary | ICD-10-CM

## 2011-03-23 NOTE — Patient Instructions (Signed)
Return to office in 4 weeks for PT/INR  Continue current dose: 1 tab daily except 1 1/2 tab on tues,thurs

## 2011-04-01 ENCOUNTER — Other Ambulatory Visit: Payer: Self-pay | Admitting: Family Medicine

## 2011-04-20 ENCOUNTER — Ambulatory Visit (INDEPENDENT_AMBULATORY_CARE_PROVIDER_SITE_OTHER): Payer: 59 | Admitting: *Deleted

## 2011-04-20 DIAGNOSIS — I82409 Acute embolism and thrombosis of unspecified deep veins of unspecified lower extremity: Secondary | ICD-10-CM

## 2011-04-20 DIAGNOSIS — Z7901 Long term (current) use of anticoagulants: Secondary | ICD-10-CM

## 2011-04-20 LAB — POCT INR: INR: 3.4

## 2011-04-20 NOTE — Patient Instructions (Signed)
Return to office in 2 weeks for PT/INR  Continue current dosing: Take 1 tab today then resume 1 tab daily except 1 1/2 tab on tues,thurs

## 2011-05-03 ENCOUNTER — Other Ambulatory Visit: Payer: Self-pay | Admitting: Family Medicine

## 2011-05-04 ENCOUNTER — Ambulatory Visit (INDEPENDENT_AMBULATORY_CARE_PROVIDER_SITE_OTHER): Payer: 59 | Admitting: *Deleted

## 2011-05-04 DIAGNOSIS — Z23 Encounter for immunization: Secondary | ICD-10-CM

## 2011-05-04 DIAGNOSIS — Z7901 Long term (current) use of anticoagulants: Secondary | ICD-10-CM

## 2011-05-04 DIAGNOSIS — I82409 Acute embolism and thrombosis of unspecified deep veins of unspecified lower extremity: Secondary | ICD-10-CM

## 2011-05-04 NOTE — Patient Instructions (Signed)
Return to office in 4 weeks for PT/INR  Continue current dosing: 1 tab daily except 1 1/2 tab on tues,thurs   .

## 2011-06-19 ENCOUNTER — Ambulatory Visit: Payer: 59

## 2011-06-20 ENCOUNTER — Ambulatory Visit (INDEPENDENT_AMBULATORY_CARE_PROVIDER_SITE_OTHER): Payer: 59 | Admitting: *Deleted

## 2011-06-20 DIAGNOSIS — I82409 Acute embolism and thrombosis of unspecified deep veins of unspecified lower extremity: Secondary | ICD-10-CM

## 2011-06-20 DIAGNOSIS — Z7901 Long term (current) use of anticoagulants: Secondary | ICD-10-CM

## 2011-06-20 NOTE — Patient Instructions (Signed)
Return to office in 4 weeks  Continue current dose: 1 tab daily except 1 1/2 tab on tues,thurs

## 2011-07-03 ENCOUNTER — Other Ambulatory Visit: Payer: Self-pay | Admitting: Family Medicine

## 2011-07-25 ENCOUNTER — Ambulatory Visit (INDEPENDENT_AMBULATORY_CARE_PROVIDER_SITE_OTHER): Payer: 59

## 2011-07-25 DIAGNOSIS — Z7901 Long term (current) use of anticoagulants: Secondary | ICD-10-CM

## 2011-07-25 DIAGNOSIS — Z86718 Personal history of other venous thrombosis and embolism: Secondary | ICD-10-CM

## 2011-07-25 NOTE — Patient Instructions (Addendum)
1 tab daily except 1 1/2 tab on tues,thurs  Return in 4 weeks.

## 2011-08-29 ENCOUNTER — Encounter: Payer: Self-pay | Admitting: Family Medicine

## 2011-08-29 ENCOUNTER — Encounter: Payer: Self-pay | Admitting: Gastroenterology

## 2011-08-29 ENCOUNTER — Ambulatory Visit (INDEPENDENT_AMBULATORY_CARE_PROVIDER_SITE_OTHER): Payer: 59 | Admitting: Family Medicine

## 2011-08-29 VITALS — BP 128/80 | HR 80 | Temp 98.3°F | Ht 73.0 in | Wt 332.4 lb

## 2011-08-29 DIAGNOSIS — I10 Essential (primary) hypertension: Secondary | ICD-10-CM

## 2011-08-29 DIAGNOSIS — Z7901 Long term (current) use of anticoagulants: Secondary | ICD-10-CM

## 2011-08-29 DIAGNOSIS — I82409 Acute embolism and thrombosis of unspecified deep veins of unspecified lower extremity: Secondary | ICD-10-CM

## 2011-08-29 DIAGNOSIS — M109 Gout, unspecified: Secondary | ICD-10-CM

## 2011-08-29 DIAGNOSIS — Z0000 Encounter for general adult medical examination without abnormal findings: Secondary | ICD-10-CM

## 2011-08-29 LAB — CBC WITH DIFFERENTIAL/PLATELET
Basophils Relative: 0.3 % (ref 0.0–3.0)
Eosinophils Relative: 1.7 % (ref 0.0–5.0)
HCT: 42.7 % (ref 39.0–52.0)
Hemoglobin: 13.6 g/dL (ref 13.0–17.0)
Lymphs Abs: 2.2 10*3/uL (ref 0.7–4.0)
MCV: 79.8 fl (ref 78.0–100.0)
Monocytes Absolute: 0.5 10*3/uL (ref 0.1–1.0)
Monocytes Relative: 8.3 % (ref 3.0–12.0)
Neutro Abs: 3.7 10*3/uL (ref 1.4–7.7)
RBC: 5.36 Mil/uL (ref 4.22–5.81)
WBC: 6.6 10*3/uL (ref 4.5–10.5)

## 2011-08-29 LAB — POCT URINALYSIS DIPSTICK
Blood, UA: NEGATIVE
Protein, UA: NEGATIVE
Spec Grav, UA: 1.02
Urobilinogen, UA: 0.2

## 2011-08-29 LAB — PROTIME-INR
INR: 2.3 ratio — ABNORMAL HIGH (ref 0.8–1.0)
Prothrombin Time: 25.4 s — ABNORMAL HIGH (ref 10.2–12.4)

## 2011-08-29 LAB — POCT INR: INR: 2.7

## 2011-08-29 NOTE — Assessment & Plan Note (Signed)
Check labs 

## 2011-08-29 NOTE — Assessment & Plan Note (Signed)
Stable  con't labs

## 2011-08-29 NOTE — Patient Instructions (Signed)
Preventative Care for Adults, Male A healthy lifestyle and preventative care can promote health and wellness. Preventative health guidelines for men include the following key practices:  A routine yearly physical is a good way to check with your caregiver about your health and preventative screening. It is a chance to share any concerns and updates on your health, and to receive a thorough exam.   Visit your dentist for a routine exam and preventative care every 6 months. Brush your teeth twice a day and floss once a day. Good oral hygiene prevents tooth decay and gum disease.   The frequency of eye exams is based on your age, health, family medical history, use of contact lenses, and other factors. Follow your caregiver's recommendations for frequency of eye exams.   Eat a healthy diet. Foods like vegetables, fruits, whole grains, low-fat dairy products, and lean protein foods contain the nutrients you need without too many calories. Decrease your intake of foods high in solid fats, added sugars, and salt. Eat the right amount of calories for you.Get information about a proper diet from your caregiver, if necessary.   Regular physical exercise is one of the most important things you can do for your health. Most adults should get at least 150 minutes of moderate-intensity exercise (any activity that increases your heart rate and causes you to sweat) each week. In addition, most adults need muscle-strengthening exercises on 2 or more days a week.   Maintain a healthy weight. The body mass index (BMI) is a screening tool to identify possible weight problems. It provides an estimate of body fat based on height and weight. Your caregiver can help determine your BMI, and can help you achieve or maintain a healthy weight.For adults 20 years and older:   A BMI below 18.5 is considered underweight.   A BMI of 18.5 to 24.9 is normal.   A BMI of 25 to 29.9 is considered overweight.   A BMI of 30 and  above is considered obese.   Maintain normal blood lipids and cholesterol levels by exercising and minimizing your intake of saturated fat. Eat a balanced diet with plenty of fruit and vegetables. Blood tests for lipids and cholesterol should begin at age 20 and be repeated every 5 years. If your lipid or cholesterol levels are high, you are over 50, or you are a high risk for heart disease, you may need your cholesterol levels checked more frequently.Ongoing high lipid and cholesterol levels should be treated with medicines if diet and exercise are not effective.   If you smoke, find out from your caregiver how to quit. If you do not use tobacco, do not start.   If you choose to drink alcohol, do not exceed 2 drinks per day. One drink is considered to be 12 ounces (355 mL) of beer, 5 ounces (148 mL) of wine, or 1.5 ounces (44 mL) of liquor.   Avoid use of street drugs. Do not share needles with anyone. Ask for help if you need support or instructions about stopping the use of drugs.   High blood pressure causes heart disease and increases the risk of stroke. Your blood pressure should be checked at least every 1 to 2 years. Ongoing high blood pressure should be treated with medicines, if weight loss and exercise are not effective.   If you are 45 to 57 years old, ask your caregiver if you should take aspirin to prevent heart disease.   Diabetes screening involves taking a blood   sample to check your fasting blood sugar level. This should be done once every 3 years, after age 45, if you are within normal weight and without risk factors for diabetes. Testing should be considered at a younger age or be carried out more frequently if you are overweight and have at least 1 risk factor for diabetes.   Colorectal cancer can be detected and often prevented. Most routine colorectal cancer screening begins at the age of 50 and continues through age 75. However, your caregiver may recommend screening at an  earlier age if you have risk factors for colon cancer. On a yearly basis, your caregiver may provide home test kits to check for hidden blood in the stool. Use of a small camera at the end of a tube, to directly examine the colon (sigmoidoscopy or colonoscopy), can detect the earliest forms of colorectal cancer. Talk to your caregiver about this at age 50, when routine screening begins. Direct examination of the colon should be repeated every 5 to 10 years through age 75, unless early forms of pre-cancerous polyps or small growths are found.   Practice safe sex. Use condoms and avoid high-risk sexual practices to reduce the spread of sexually transmitted infections (STIs). STIs include gonorrhea, chlamydia, syphilis, trichomonas, herpes, HPV, and human immunodeficiency virus (HIV). Herpes, HIV, and HPV are viral illnesses that have no cure. They can result in disability, cancer, and death.   A one-time screening for abdominal aortic aneurysm (AAA) and surgical repair of large AAAs by sound wave imaging (ultrasonography) is recommended for ages 65 to 75 years who are current or former smokers.   Healthy men should no longer receive prostate-specific antigen (PSA) blood tests as part of routine cancer screening. Consult with your caregiver about prostate cancer screening.   Use sunscreen with skin protection factor (SPF) of 30 or more. Apply sunscreen liberally and repeatedly throughout the day. You should seek shade when your shadow is shorter than you. Protect yourself by wearing long sleeves, pants, a wide-brimmed hat, and sunglasses year round, whenever you are outdoors.   Once a month, do a whole body skin exam, using a mirror to look at the skin on your back. Notify your caregiver of new moles, moles that have irregular borders, moles that are larger than a pencil eraser, or moles that have changed in shape or color.   Stay current with required immunizations.   Influenza. You need a dose every  fall (or winter). The composition of the flu vaccine changes each year, so being vaccinated once is not enough.   Pneumococcal polysaccharide. You need 1 to 2 doses if you smoke cigarettes or if you have certain chronic medical conditions. You need 1 dose at age 65 (or older) if you have never been vaccinated.   Tetanus, diphtheria, pertussis (Tdap, Td). Get 1 dose of Tdap vaccine if you are younger than age 65 years, are over 65 and have contact with an infant, are a healthcare worker, or simply want to be protected from whooping cough. After that, you need a Td booster dose every 10 years. Consult your caregiver if you have not had at least 3 tetanus and diphtheria-containing shots sometime in your life or have a deep or dirty wound.   HPV. This vaccine is recommended for males 13 through 57 years of age. This vaccine may be given to men 22 through 57 years of age who have not completed the 3 dose series. It is recommended for men through age 26   who have sex with men or whose immune system is weakened because of HIV infection, other illness, or medications. The vaccine is given in 3 doses over 6 months.   Measles, mumps, rubella (MMR). You need at least 1 dose of MMR if you were born in 1957 or later. You may also need a 2nd dose.   Meningococcal. If you are age 19 to 21 years and a first-year college student living in a residence hall, or have one of several medical conditions, you need to get vaccinated against meningococcal disease. You may also need additional booster doses.   Zoster (shingles). If you are age 60 years or older, you should get this vaccine.   Varicella (chickenpox). If you have never had chickenpox or you were vaccinated but received only 1 dose, talk to your caregiver to find out if you need this vaccine.   Hepatitis A. You need this vaccine if you have a specific risk factor for hepatitis A virus infection, or you simply wish to be protected from this disease. The vaccine is  usually given as 2 doses, 6 to 18 months apart.   Hepatitis B. You need this vaccine if you have a specific risk factor for hepatitis B virus infection or you simply wish to be protected from this disease. The vaccine is given in 3 doses, usually over 6 months.  Preventative Service / Frequency Ages 19 to 39  Blood pressure check.** / Every 1 to 2 years.   Lipid and cholesterol check.**/ Every 5 years beginning at age 20.   Skin self-exam. / Monthly.   Influenza immunization.** / Every year.   Pneumococcal polysaccharide immunization.** / 1 to 2 doses if you smoke cigarettes or if you have certain chronic medical conditions.   Tetanus, diphtheria, pertussis (Tdap,Td) immunization. / A one-time dose of Tdap vaccine. After that, you need a Td booster dose every 10 years.   HPV immunization. / 3 doses over 6 months, if 26 and younger.   Measles, mumps, rubella (MMR) immunization. / You need at least 1 dose of MMR if you were born in 1957 or later. You may also need a 2nd dose.   Meningococcal immunization. / 1 dose if you are age 19 to 21 years and a first-year college student living in a residence hall, or have one of several medical conditions, you need to get vaccinated against meningococcal disease. You may also need additional booster doses.   Varicella immunization. **/ Consult your caregiver.   Hepatitis A immunization. ** / Consult your caregiver. 2 doses, 6 to 18 months apart.   Hepatitis B immunization.** / Consult your caregiver. 3 doses usually over 6 months.  Ages 40 to 64  Blood pressure check.** / Every 1 to 2 years.   Lipid and cholesterol check.**/ Every 5 years beginning at age 20.   Fecal occult blood test (FOBT) of stool. / Every year beginning at age 50 and continuing until age 75. You may not have to do this test if you get colonoscopy every 10 years.   Flexible sigmoidoscopy** or colonoscopy.** / Every 5 years for a flexible sigmoidoscopy or every 10 years for  a colonoscopy beginning at age 50 and continuing until age 75.   Skin self-exam. / Monthly.   Influenza immunization.** / Every year.   Pneumococcal polysaccharide immunization.** / 1 to 2 doses if you smoke cigarettes or if you have certain chronic medical conditions.   Tetanus, diphtheria, pertussis (Tdap/Td) immunization.** / A one-time dose of   Tdap vaccine. After that, you need a Td booster dose every 10 years.   Measles, mumps, rubella (MMR) immunization. / You need at least 1 dose of MMR if you were born in 1957 or later. You may also need a 2nd dose.   Varicella immunization. **/ Consult your caregiver.   Meningococcal immunization.** / Consult your caregiver.   Hepatitis A immunization. ** / Consult your caregiver. 2 doses, 6 to 18 months apart.   Hepatitis B immunization.** / Consult your caregiver. 3 doses, usually over 6 months.  Ages 65 and over  Blood pressure check.** / Every 1 to 2 years.   Lipid and cholesterol check.**/ Every 5 years beginning at age 20.   Fecal occult blood test (FOBT) of stool. / Every year beginning at age 50 and continuing until age 75. You may not have to do this test if you get colonoscopy every 10 years.   Flexible sigmoidoscopy** or colonoscopy.** / Every 5 years for a flexible sigmoidoscopy or every 10 years for a colonoscopy beginning at age 50 and continuing until age 75.   Abdominal aortic aneurysm (AAA) screening.** / A one-time screening for ages 65 to 75 years who are current or former smokers.   Skin self-exam. / Monthly.   Influenza immunization.** / Every year.   Pneumococcal polysaccharide immunization.** / 1 dose at age 65 (or older) if you have never been vaccinated.   Tetanus, diphtheria, pertussis (Tdap, Td) immunization. / A one-time dose of Tdap vaccine if you are over 65 and have contact with an infant, are a healthcare worker, or simply want to be protected from whooping cough. After that, you need a Td booster dose  every 10 years.   Varicella immunization. **/ Consult your caregiver.   Meningococcal immunization.** / Consult your caregiver.   Hepatitis A immunization. ** / Consult your caregiver. 2 doses, 6 to 18 months apart.   Hepatitis B immunization.** / Check with your caregiver. 3 doses, usually over 6 months.  **Family history and personal history of risk and conditions may change your caregiver's recommendations. Document Released: 08/29/2001 Document Revised: 03/15/2011 Document Reviewed: 11/28/2010 [employer] Patient Information 2012 [employer]. 

## 2011-08-29 NOTE — Progress Notes (Signed)
Subjective:    Patient ID: Daniel Powers, male    DOB: [birth year], 56 y.o.   MRN: [id number]  HPI Pt here for cpe and labs,  No complaints.   Review of Systems Review of Systems  Constitutional: Negative for activity change, appetite change and fatigue.  HENT: Negative for hearing loss, congestion, tinnitus and ear discharge.  dentist q37m Eyes: Negative for visual disturbance (see optho q2y -- vision corrected to 20/20 with glasses).  Respiratory: Negative for cough, chest tightness and shortness of breath.   Cardiovascular: Negative for chest pain, palpitations and leg swelling.  Gastrointestinal: Negative for abdominal pain, diarrhea, constipation and abdominal distention.  Genitourinary: Negative for urgency, frequency, decreased urine volume and difficulty urinating.  Musculoskeletal: Negative for back pain, arthralgias and gait problem.  Skin: Negative for color change, pallor and rash.  Neurological: Negative for dizziness, light-headedness, numbness and headaches.  Hematological: Negative for adenopathy. Does not bruise/bleed easily.  Psychiatric/Behavioral: Negative for suicidal ideas, confusion, sleep disturbance, self-injury, dysphoric mood, decreased concentration and agitation.   Past Medical History  Diagnosis Date  . Hypertension   . DVT (deep venous thrombosis)    History   Social History  . Marital Status: Married    Spouse Name: N/A    Number of Children: N/A  . Years of Education: N/A   Occupational History  . attorney--  w/s    Social History Main Topics  . Smoking status: Never Smoker   . Smokeless tobacco: Never Used  . Alcohol Use: Yes     rare  . Drug Use: No  . Sexually Active: Yes -- Male partner(s)   Other Topics Concern  . Not on file   Social History Narrative   Exercise --no   Family History  Problem Relation Age of Onset  . Hypertension Mother   . Alzheimer's disease Mother   . Hypertension Father   . Alcohol abuse Father   .  Cancer Sister     "male organs"  . Cancer Maternal Uncle     lung  . Alzheimer's disease Paternal Aunt   . Hypertension Maternal Grandmother   . Alzheimer's disease Maternal Grandmother   . Hypertension Maternal Grandfather   . Hypertension Paternal Grandmother   . Hypertension Paternal Grandfather          Objective:   Physical Exam BP 128/80  Pulse 80  Temp(Src) 98.3 F (36.8 C) (Oral)  Ht 6\' 1"  (1.854 m)  Wt 332 lb 6.4 oz (150.776 kg)  BMI 43.85 kg/m2  SpO2 98%  General Appearance:    Alert, cooperative, no distress, appears stated age--+ obesity  Head:    Normocephalic, without obvious abnormality, atraumatic  Eyes:    PERRL, conjunctiva/corneas clear, EOM's intact, fundi    benign, both eyes       Ears:    Normal TM's and external ear canals, both ears  Nose:   Nares normal, septum midline, mucosa normal, no drainage   or sinus tenderness  Throat:   Lips, mucosa, and tongue normal; teeth and gums normal  Neck:   Supple, symmetrical, trachea midline, no adenopathy;       thyroid:  No enlargement/tenderness/nodules; no carotid   bruit or JVD  Back:     Symmetric, no curvature, ROM normal, no CVA tenderness  Lungs:     Clear to auscultation bilaterally, respirations unlabored  Chest wall:    No tenderness or deformity  Heart:    Regular rate and rhythm, S1 and S2 normal,  no murmur, rub   or gallop  Abdomen:     Soft, non-tender, bowel sounds active all four quadrants,    no masses, no organomegaly  Genitalia:    Normal male without lesion, discharge or tenderness  Rectal:    Normal tone, normal prostate, no masses or tenderness;   guaiac negative stool  Extremities:   Extremities normal, atraumatic, no cyanosis or edema  Pulses:   2+ and symmetric all extremities  Skin:   Skin color, texture, turgor normal, no rashes or lesions  Lymph nodes:   Cervical, supraclavicular, and axillary nodes normal  Neurologic:   CNII-XII intact. Normal strength, sensation and  reflexes      throughout         Assessment & Plan:  cpe--- check labs,  ghm utd

## 2011-08-29 NOTE — Assessment & Plan Note (Signed)
On coumadin con't meds--recheck 1 month

## 2011-08-30 LAB — HEPATIC FUNCTION PANEL
AST: 21 U/L (ref 0–37)
Bilirubin, Direct: 0.1 mg/dL (ref 0.0–0.3)
Total Bilirubin: 0.6 mg/dL (ref 0.3–1.2)

## 2011-08-30 LAB — LIPID PANEL
Cholesterol: 171 mg/dL (ref 0–200)
LDL Cholesterol: 121 mg/dL — ABNORMAL HIGH (ref 0–99)
VLDL: 15.6 mg/dL (ref 0.0–40.0)

## 2011-08-30 LAB — BASIC METABOLIC PANEL
BUN: 15 mg/dL (ref 6–23)
Chloride: 106 mEq/L (ref 96–112)
GFR: 105.08 mL/min (ref >60.00)
Potassium: 3.9 mEq/L (ref 3.5–5.1)
Sodium: 138 mEq/L (ref 135–145)

## 2011-09-04 ENCOUNTER — Other Ambulatory Visit: Payer: Self-pay | Admitting: Family Medicine

## 2011-09-20 ENCOUNTER — Ambulatory Visit (INDEPENDENT_AMBULATORY_CARE_PROVIDER_SITE_OTHER): Payer: 59 | Admitting: Gastroenterology

## 2011-09-20 ENCOUNTER — Encounter: Payer: Self-pay | Admitting: Gastroenterology

## 2011-09-20 ENCOUNTER — Telehealth: Payer: Self-pay | Admitting: *Deleted

## 2011-09-20 VITALS — BP 126/68 | HR 84 | Ht 71.0 in | Wt 334.1 lb

## 2011-09-20 DIAGNOSIS — Z1211 Encounter for screening for malignant neoplasm of colon: Secondary | ICD-10-CM

## 2011-09-20 NOTE — Assessment & Plan Note (Signed)
Plan screening colonoscopy. Coumadin will be held if approved by Dr. Laury Axon

## 2011-09-20 NOTE — Progress Notes (Signed)
History of Present Illness: Daniel Powers is a pleasant 56 year old American male referred at the request of Dr. Laury Axon for screening colonoscopy.  He has no GI complaints including change in bowel habits, abdominal pain, melena or hematochezia. He underwent colonoscopy about 10 years ago because of limited rectal bleeding. This apparently was normal.  The patient has a history of DVT diagnosed in July, 2012 and is on Coumadin.    Past Medical History  Diagnosis Date  . Hypertension   . DVT (deep venous thrombosis)   . Psychosexual dysfunction with inhibited sexual excitement   . Gout   . Influenza with pneumonia    Past Surgical History  Procedure Date  . Tonsillectomy and adenoidectomy   . Hand surgery     right   family history includes Alcohol abuse in his father; Alzheimer's disease in his maternal grandmother, mother, and paternal aunt; Cancer in his sister; Hypertension in his father, maternal grandfather, maternal grandmother, mother, paternal grandfather, and paternal grandmother; and Lung cancer in his maternal uncle. Current Outpatient Prescriptions  Medication Sig Dispense Refill  . amLODipine (NORVASC) 5 MG tablet TAKE 1 TABLET DAILY  30 tablet  5  . DIOVAN 160 MG tablet TAKE 1 TABLET BY MOUTH TWICE DAILY  60 tablet  5  . fish oil-omega-3 fatty acids 1000 MG capsule Take 1 g by mouth daily.      . tadalafil (CIALIS) 20 MG tablet Take 20 mg by mouth daily as needed.      . warfarin (COUMADIN) 5 MG tablet TAKE 1 TABLET BY MOUTH EVERY DAY AS DIRECTED  180 tablet  0   Allergies as of 09/20/2011 - Review Complete 09/20/2011  Allergen Reaction Noted  . Penicillins  12/20/2006    reports that he has never smoked. He has never used smokeless tobacco. He reports that he drinks alcohol. He reports that he does not use illicit drugs.     Review of Systems: Pertinent positive and negative review of systems were noted in the above HPI section. All other review of systems were  otherwise negative.  Vital signs were reviewed in today's medical record Physical Exam: General: He is an obese male no acute distress Head: Normocephalic and atraumatic Eyes:  sclerae anicteric, EOMI Ears: Normal auditory acuity Mouth: No deformity or lesions Neck: Supple, no masses or thyromegaly Lungs: Clear throughout to auscultation Heart: Regular rate and rhythm; no murmurs, rubs or bruits Abdomen: Soft, non tender and non distended. No masses, hepatosplenomegaly or hernias noted. Normal Bowel sounds Rectal:deferred Musculoskeletal: Symmetrical with no gross deformities  Skin: No lesions on visible extremities Pulses:  Normal pulses noted Extremities: No clubbing, cyanosis, edema or deformities noted Neurological: Alert oriented x 4, grossly nonfocal Cervical Nodes:  No significant cervical adenopathy Inguinal Nodes: No significant inguinal adenopathy Psychological:  Alert and cooperative. Normal mood and affect

## 2011-09-20 NOTE — Telephone Encounter (Signed)
Kennon Rounds please advise of recommendations for this patient. Thank You.

## 2011-09-20 NOTE — Telephone Encounter (Signed)
[facility] [address] [place] [phone] PHONE [phone] FAX 09/20/2011    RE: Daniel Powers DOB: May 08, 1956 MRN: [id number]   Dear Dr Laury Axon,    We have scheduled the above patient for an endoscopic procedure. Our records show that he is on anticoagulation therapy.   Please advise as to how long the patient may come off his therapy of coumadin prior to the procedure, which is scheduled for 10/06/2011.  Please fax back/ or route the completed form to Joud Pettinato  at [phone].   Sincerely,  Merri Ray

## 2011-09-20 NOTE — Patient Instructions (Signed)
Colonoscopy A colonoscopy is an exam to evaluate your entire colon. In this exam, your colon is cleansed. A long fiberoptic tube is inserted through your rectum and into your colon. The fiberoptic scope (endoscope) is a long bundle of enclosed and very flexible fibers. These fibers transmit light to the area examined and send images from that area to your caregiver. Discomfort is usually minimal. You may be given a drug to help you sleep (sedative) during or prior to the procedure. This exam helps to detect lumps (tumors), polyps, inflammation, and areas of bleeding. Your caregiver may also take a small piece of tissue (biopsy) that will be examined under a microscope. LET YOUR CAREGIVER KNOW ABOUT:   Allergies to food or medicine.   Medicines taken, including vitamins, herbs, eyedrops, over-the-counter medicines, and creams.   Use of steroids (by mouth or creams).   Previous problems with anesthetics or numbing medicines.   History of bleeding problems or blood clots.   Previous surgery.   Other health problems, including diabetes and kidney problems.   Possibility of pregnancy, if this applies.  BEFORE THE PROCEDURE   A clear liquid diet may be required for 2 days before the exam.   Ask your caregiver about changing or stopping your regular medications.   Liquid injections (enemas) or laxatives may be required.   A large amount of electrolyte solution may be given to you to drink over a short period of time. This solution is used to clean out your colon.   You should be present 60 minutes prior to your procedure or as directed by your caregiver.  AFTER THE PROCEDURE   If you received a sedative or pain relieving medication, you will need to arrange for someone to drive you home.   Occasionally, there is a little blood passed with the first bowel movement. Do not be concerned.  FINDING OUT THE RESULTS OF YOUR TEST Not all test results are available during your visit. If your test  results are not back during the visit, make an appointment with your caregiver to find out the results. Do not assume everything is normal if you have not heard from your caregiver or the medical facility. It is important for you to follow up on all of your test results. HOME CARE INSTRUCTIONS   It is not unusual to pass moderate amounts of gas and experience mild abdominal cramping following the procedure. This is due to air being used to inflate your colon during the exam. Walking or a warm pack on your belly (abdomen) may help.   You may resume all normal meals and activities after sedatives and medicines have worn off.   Only take over-the-counter or prescription medicines for pain, discomfort, or fever as directed by your caregiver. Do not use aspirin or blood thinners if a biopsy was taken. Consult your caregiver for medicine usage if biopsies were taken.  SEEK IMMEDIATE MEDICAL CARE IF:   You have a fever.   You pass large blood clots or fill a toilet with blood following the procedure. This may also occur 10 to 14 days following the procedure. This is more likely if a biopsy was taken.   You develop abdominal pain that keeps getting worse and cannot be relieved with medicine.  Document Released: 06/30/2000 Document Revised: 06/22/2011 Document Reviewed: 02/13/2008 [facility] Patient Information 2012 [place], [place].  Your colonoscopy is scheduled on 10/06/2011 at 11:30am Your MoviPrep has been sent to your pharmacy We will contact Dr Laury Axon about holding  your Coumadin

## 2011-09-20 NOTE — Telephone Encounter (Signed)
Will cardiology coumadin clinic still help [name] with this until ---ours is set up?

## 2011-09-22 NOTE — Telephone Encounter (Signed)
Reviewed pt's history.  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  I did not notice any additional risk factors for VTE.  Given multiple clots, he would be considered moderate risk.  The CHEST recommendation is vague and leaves the decision to bridge up to the provider.  If you want to be on the more conservative side, I would bridge him.

## 2011-09-22 NOTE — Telephone Encounter (Signed)
Will the coumadin clinic be managing his Lovenox bridge?

## 2011-09-22 NOTE — Telephone Encounter (Signed)
No they will not do it anymore because he is not a cardiology pt.  I guess we wil need to do it.

## 2011-09-22 NOTE — Telephone Encounter (Signed)
There was no form attached to message.

## 2011-09-22 NOTE — Telephone Encounter (Signed)
Text was copied from the letter in the system. Please advise on Bridge and I will forward back to [place].     KP

## 2011-09-22 NOTE — Telephone Encounter (Signed)
He should come off coumadin 5 days before and he will need  A lovenox bridge.

## 2011-09-25 MED ORDER — PEG-KCL-NACL-NASULF-NA ASC-C 100 G PO SOLR: 1.0000 | Freq: Once | ORAL | Status: DC

## 2011-09-25 NOTE — Telephone Encounter (Signed)
Contacted patient that he needs a Lovenox bridge and it was going to be done by Dr Hulan Saas office, He is going to contact the office and get that set up.

## 2011-09-28 MED ORDER — ENOXAPARIN SODIUM 150 MG/ML ~~LOC~~ SOLN: 1.0000 mg/kg | Freq: Two times a day (BID) | SUBCUTANEOUS | Status: DC

## 2011-09-28 NOTE — Telephone Encounter (Signed)
Addended by: Candie Echevaria L on: 09/28/2011 03:51 PM   Modules accepted: Orders

## 2011-09-28 NOTE — Telephone Encounter (Addendum)
Per Dr Laury Axon advise Pt to stop coumadin 5 days prior to procedure then on the night of procedure can began Lovenox and resume coumadin if no polys found on colonoscopy. If polyps found Pt will need to wait until the next day to began meds again. Pt to come in for INR check 2 days after resuming med and injection. Pt ok and verbalized understanding, .Rx sent

## 2011-10-06 ENCOUNTER — Ambulatory Visit (AMBULATORY_SURGERY_CENTER): Payer: 59 | Admitting: Gastroenterology

## 2011-10-06 ENCOUNTER — Encounter: Payer: Self-pay | Admitting: Gastroenterology

## 2011-10-06 VITALS — BP 127/67 | HR 78 | Temp 97.9°F | Resp 20 | Ht 71.0 in | Wt 334.0 lb

## 2011-10-06 DIAGNOSIS — D126 Benign neoplasm of colon, unspecified: Secondary | ICD-10-CM

## 2011-10-06 DIAGNOSIS — Z1211 Encounter for screening for malignant neoplasm of colon: Secondary | ICD-10-CM

## 2011-10-06 DIAGNOSIS — K5730 Diverticulosis of large intestine without perforation or abscess without bleeding: Secondary | ICD-10-CM

## 2011-10-06 MED ORDER — SODIUM CHLORIDE 0.9 % IV SOLN: 500.0000 mL | INTRAVENOUS | Status: DC

## 2011-10-06 NOTE — Progress Notes (Signed)
No complaints noted in the recovery room. Maw  Patient did not experience any of the following events: a burn prior to discharge; a fall within the facility; wrong site/side/patient/procedure/implant event; or a hospital transfer or hospital admission upon discharge from the facility. (G8907) Patient did not have preoperative order for IV antibiotic SSI prophylaxis. (G8918)  

## 2011-10-06 NOTE — Progress Notes (Addendum)
Propofol given per Paulita Cradle CRN  Pt snoring off and on t/o procedure.  When he snores, his sats drop- 77-80%  Chin lift done per CRNA and O2 on at 15/L.  Sats up to 95%  1144- sats 100%

## 2011-10-06 NOTE — Patient Instructions (Addendum)
Resume  Coumadin in am. Discharge instructions given with verbal understanding. Handouts on polyps and diverticulosis given. Resume all other medications today.YOU HAD AN ENDOSCOPIC PROCEDURE TODAY AT THE [facility]: Refer to the procedure report that was given to you for any specific questions about what was found during the examination.  If the procedure report does not answer your questions, please call your gastroenterologist to clarify.  If you requested that your care partner not be given the details of your procedure findings, then the procedure report has been included in a sealed envelope for you to review at your convenience later.  YOU SHOULD EXPECT: Some feelings of bloating in the abdomen. Passage of more gas than usual.  Walking can help get rid of the air that was put into your GI tract during the procedure and reduce the bloating. If you had a lower endoscopy (such as a colonoscopy or flexible sigmoidoscopy) you may notice spotting of blood in your stool or on the toilet paper. If you underwent a bowel prep for your procedure, then you may not have a normal bowel movement for a few days.  DIET: Your first meal following the procedure should be a light meal and then it is ok to progress to your normal diet.  A half-sandwich or bowl of soup is an example of a good first meal.  Heavy or fried foods are harder to digest and may make you feel nauseous or bloated.  Likewise meals heavy in dairy and vegetables can cause extra gas to form and this can also increase the bloating.  Drink plenty of fluids but you should avoid alcoholic beverages for 24 hours.  ACTIVITY: Your care partner should take you home directly after the procedure.  You should plan to take it easy, moving slowly for the rest of the day.  You can resume normal activity the day after the procedure however you should NOT DRIVE or use heavy machinery for 24 hours (because of the sedation medicines used during the test).     SYMPTOMS TO REPORT IMMEDIATELY: A gastroenterologist can be reached at any hour.  During normal business hours, 8:30 AM to 5:00 PM Monday through Friday, call [phone].  After hours and on weekends, please call the GI answering service at [phone] who will take a message and have the physician on call contact you.   Following lower endoscopy (colonoscopy or flexible sigmoidoscopy):  Excessive amounts of blood in the stool  Significant tenderness or worsening of abdominal pains  Swelling of the abdomen that is new, acute  Fever of 100F or higher  FOLLOW UP: If any biopsies were taken you will be contacted by phone or by letter within the next 1-3 weeks.  Call your gastroenterologist if you have not heard about the biopsies in 3 weeks.  Our staff will call the home number listed on your records the next business day following your procedure to check on you and address any questions or concerns that you may have at that time regarding the information given to you following your procedure. This is a courtesy call and so if there is no answer at the home number and we have not heard from you through the emergency physician on call, we will assume that you have returned to your regular daily activities without incident.  SIGNATURES/CONFIDENTIALITY: You and/or your care partner have signed paperwork which will be entered into your electronic medical record.  These signatures attest to the fact that that the  information above on your After Visit Summary has been reviewed and is understood.  Full responsibility of the confidentiality of this discharge information lies with you and/or your care-partner.

## 2011-10-06 NOTE — Op Note (Signed)
[facility] [address] [place], [place]  [address]  COLONOSCOPY PROCEDURE REPORT  PATIENT:  Daniel Powers, Daniel Powers  MR#:  [id number] BIRTHDATE:  01/21/1956, 56 yrs. old  GENDER:  male ENDOSCOPIST:  Barbette Hair. Arlyce Dice, MD REF. BY:  Loreen Freud, DO PROCEDURE DATE:  10/06/2011 PROCEDURE:  Colonoscopy with snare polypectomy, Colon with cold biopsy polypectomy ASA CLASS:  Class II INDICATIONS:  Routine Risk Screening MEDICATIONS:   MAC sedation, administered by CRNA propofol 230mg IV  DESCRIPTION OF PROCEDURE:   After the risks benefits and alternatives of the procedure were thoroughly explained, informed consent was obtained.  Digital rectal exam was performed and revealed no abnormalities.   The [id number] endoscope was introduced through the anus and advanced to the cecum, which was identified by both the appendix and ileocecal valve, without limitations.  The quality of the prep was excellent, using MoviPrep.  The instrument was then slowly withdrawn as the colon was fully examined. <<PROCEDUREIMAGES>>  FINDINGS:  Two polyps were found in the cecum. 2 and 4mm sessile polyps - removed with cold bx forceps and cold polypectomy snare, respectively, submitted to pathology (see image2).  A diminutive polyp was found in the descending colon. It was 2 mm in size. The polyp was removed using cold biopsy forceps (see image5).  Mild diverticulosis was found in the sigmoid colon (see image7).  This was otherwise a normal examination of the colon (see image1 and image8).   Retroflexed views in the rectum revealed no abnormalities.    The time to cecum =  1) 2.50  minutes. The scope was then withdrawn in  1) 13.25  minutes from the cecum and the procedure completed. COMPLICATIONS:  None ENDOSCOPIC IMPRESSION: 1) Two polyps in the cecum 2) 2 mm diminutive polyp in the descending colon 3) Mild diverticulosis in the sigmoid colon 4) Otherwise normal examination RECOMMENDATIONS: 1) If  the polyp(s) removed today are proven to be adenomatous (pre-cancerous) polyps, you will need a repeat colonoscopy in 5 years. Otherwise you should continue to follow colorectal cancer screening guidelines for "routine risk" patients with colonoscopy in 10 years. You will receive a letter within 1-2 weeks with the results of your biopsy as well as final recommendations. Please call my office if you have not received a letter after 3 weeks. 2) resume coumadin in am REPEAT EXAM:  You will receive a letter from Dr. Arlyce Dice in 1-2 weeks, after reviewing the final pathology, with followup recommendations.  ______________________________ Barbette Hair Arlyce Dice, MD  CC:  n. eSIGNED:   Barbette Hair. Takeyla Million at 10/06/2011 11:56 AM  Mathews Argyle, [phone]

## 2011-10-09 ENCOUNTER — Telehealth: Payer: Self-pay | Admitting: *Deleted

## 2011-10-09 NOTE — Telephone Encounter (Signed)
  Follow up Call-  Call back number 10/06/2011  Post procedure Call Back phone  # [phone]  Permission to leave phone message Yes     Patient questions:  Do you have a fever, pain , or abdominal swelling? no Pain Score  0 *  Have you tolerated food without any problems? yes  Have you been able to return to your normal activities? yes  Do you have any questions about your discharge instructions: Diet   no Medications  no Follow up visit  no  Do you have questions or concerns about your Care? no  Actions: * If pain score is 4 or above: No action needed, pain <4.

## 2011-10-10 ENCOUNTER — Ambulatory Visit (INDEPENDENT_AMBULATORY_CARE_PROVIDER_SITE_OTHER): Payer: 59 | Admitting: *Deleted

## 2011-10-10 VITALS — BP 120/87 | HR 80 | Temp 98.6°F | Wt 329.0 lb

## 2011-10-10 DIAGNOSIS — Z86718 Personal history of other venous thrombosis and embolism: Secondary | ICD-10-CM

## 2011-10-10 DIAGNOSIS — Z7901 Long term (current) use of anticoagulants: Secondary | ICD-10-CM

## 2011-10-10 NOTE — Patient Instructions (Addendum)
Return to office on Thursday   Continue Lovenox and take 2 tabs today then resume current dose

## 2011-10-11 ENCOUNTER — Encounter: Payer: Self-pay | Admitting: Gastroenterology

## 2011-10-12 ENCOUNTER — Telehealth: Payer: Self-pay | Admitting: *Deleted

## 2011-10-12 ENCOUNTER — Ambulatory Visit (INDEPENDENT_AMBULATORY_CARE_PROVIDER_SITE_OTHER): Payer: 59 | Admitting: *Deleted

## 2011-10-12 VITALS — BP 122/86 | HR 80 | Temp 98.4°F | Wt 330.0 lb

## 2011-10-12 DIAGNOSIS — Z7901 Long term (current) use of anticoagulants: Secondary | ICD-10-CM

## 2011-10-12 DIAGNOSIS — Z86718 Personal history of other venous thrombosis and embolism: Secondary | ICD-10-CM

## 2011-10-12 LAB — POCT INR: INR: 1.5

## 2011-10-12 MED ORDER — ENOXAPARIN SODIUM 150 MG/ML ~~LOC~~ SOLN: SUBCUTANEOUS | Status: DC

## 2011-10-12 NOTE — Patient Instructions (Signed)
Return to office in 1 week  New dosing: 2 tabs today than 1 tab daily except 1 1/2 tab tues,thur, sat

## 2011-10-12 NOTE — Telephone Encounter (Signed)
Received call from [facility] to clarify the dosage for pt lovenox, advised pt should have 150mg  total, the epic system converts the order how it is supposed to be per pt to receive 150mg  total, another clinician Felecia took over the call and finished discussion with proper dosage for pt per MD Laury Axon

## 2011-10-20 ENCOUNTER — Emergency Department (HOSPITAL_COMMUNITY)
Admission: EM | Admit: 2011-10-20 | Discharge: 2011-10-21 | Disposition: A | Discharge status: Home / Self Care | Payer: 59 | Attending: Emergency Medicine | Admitting: Emergency Medicine

## 2011-10-20 ENCOUNTER — Encounter: Payer: Self-pay | Admitting: Family Medicine

## 2011-10-20 ENCOUNTER — Ambulatory Visit (INDEPENDENT_AMBULATORY_CARE_PROVIDER_SITE_OTHER): Payer: 59 | Admitting: Family Medicine

## 2011-10-20 ENCOUNTER — Encounter (HOSPITAL_COMMUNITY): Payer: Self-pay | Admitting: Family Medicine

## 2011-10-20 VITALS — BP 132/78 | HR 83 | Temp 98.4°F | Wt 334.0 lb

## 2011-10-20 DIAGNOSIS — F419 Anxiety disorder, unspecified: Secondary | ICD-10-CM

## 2011-10-20 DIAGNOSIS — R002 Palpitations: Secondary | ICD-10-CM

## 2011-10-20 DIAGNOSIS — R0989 Other specified symptoms and signs involving the circulatory and respiratory systems: Secondary | ICD-10-CM

## 2011-10-20 DIAGNOSIS — F411 Generalized anxiety disorder: Secondary | ICD-10-CM

## 2011-10-20 DIAGNOSIS — I10 Essential (primary) hypertension: Secondary | ICD-10-CM | POA: Insufficient documentation

## 2011-10-20 DIAGNOSIS — R05 Cough: Secondary | ICD-10-CM | POA: Insufficient documentation

## 2011-10-20 DIAGNOSIS — Z86718 Personal history of other venous thrombosis and embolism: Secondary | ICD-10-CM | POA: Insufficient documentation

## 2011-10-20 DIAGNOSIS — Z7901 Long term (current) use of anticoagulants: Secondary | ICD-10-CM

## 2011-10-20 DIAGNOSIS — R0683 Snoring: Secondary | ICD-10-CM

## 2011-10-20 DIAGNOSIS — R059 Cough, unspecified: Secondary | ICD-10-CM | POA: Insufficient documentation

## 2011-10-20 DIAGNOSIS — R0609 Other forms of dyspnea: Secondary | ICD-10-CM

## 2011-10-20 LAB — CBC WITH DIFFERENTIAL/PLATELET
Eosinophils Relative: 1.5 % (ref 0.0–5.0)
Lymphocytes Relative: 28.6 % (ref 12.0–46.0)
MCV: 81 fl (ref 78.0–100.0)
Monocytes Absolute: 0.5 10*3/uL (ref 0.1–1.0)
Neutrophils Relative %: 62.3 % (ref 43.0–77.0)
Platelets: 225 10*3/uL (ref 150.0–400.0)
WBC: 6.9 10*3/uL (ref 4.5–10.5)

## 2011-10-20 LAB — HEPATIC FUNCTION PANEL
AST: 29 U/L (ref 0–37)
Bilirubin, Direct: 0 mg/dL (ref 0.0–0.3)
Total Bilirubin: 0.4 mg/dL (ref 0.3–1.2)

## 2011-10-20 LAB — BASIC METABOLIC PANEL
BUN: 15 mg/dL (ref 6–23)
Calcium: 8.8 mg/dL (ref 8.4–10.5)
Chloride: 104 mEq/L (ref 96–112)
Creatinine, Ser: 1.1 mg/dL (ref 0.4–1.5)
GFR: 89.62 mL/min (ref >60.00)

## 2011-10-20 LAB — TSH: TSH: 0.65 u[IU]/mL (ref 0.35–5.50)

## 2011-10-20 MED ORDER — ESCITALOPRAM OXALATE 10 MG PO TABS: 10.0000 mg | ORAL_TABLET | Freq: Every day | ORAL | Status: DC

## 2011-10-20 MED ORDER — ALPRAZOLAM 0.25 MG PO TABS: 0.2500 mg | ORAL_TABLET | Freq: Two times a day (BID) | ORAL | Status: AC | PRN

## 2011-10-20 NOTE — Progress Notes (Signed)
  Subjective:     Daniel Powers is a 56 y.o. male who presents for new evaluation and treatment of anxiety disorder and sleep disturbance. He has the following anxiety symptoms: insomnia. Onset of symptoms was approximately 1 month ago. Symptoms have been gradually worsening since that time. He denies current suicidal and homicidal ideation. Family history significant for no psychiatric illness. Risk factors: inc stress at work. Previous treatment includes none. He complains of the following medication side effects: nervousness. The following portions of the patient's history were reviewed and updated as appropriate: allergies, current medications, past family history, past medical history, past social history, past surgical history and problem list.  Review of Systems Pertinent items are noted in HPI.    Objective:    BP 132/78  Pulse 83  Temp(Src) 98.4 F (36.9 C) (Oral)  Wt 334 lb (151.501 kg)  SpO2 98% General appearance: alert, cooperative, appears stated age and no distress Lungs: clear to auscultation bilaterally Heart: S1, S2 normal Neurologic: Alert and oriented X 3, normal strength and tone. Normal symmetric reflexes. Normal coordination and gait    Assessment:    anxiety disorder, panic attacks and sleep disturbance. Possible organic contributing causes are: none.   Plan:    Medications: Lexapro and Xanax. Labs: Comprehensive metabolic profile, CBC and TSH. Handouts describing disease, natural history, and treatment were given to the patient. Follow up: 1 month.  Refer for sleep study as well

## 2011-10-20 NOTE — Patient Instructions (Signed)

## 2011-10-20 NOTE — ED Notes (Signed)
Patient states that he had been feeling anxious for 3 weeks, saw PCP today and was prescribed Xanax 0.25mg  today. States he took one tab and it did not help. States he still feels anxious and cannot sleep. States anxiety is getting worse.

## 2011-10-21 ENCOUNTER — Emergency Department (HOSPITAL_COMMUNITY): Payer: 59

## 2011-10-21 ENCOUNTER — Other Ambulatory Visit: Payer: Self-pay

## 2011-10-21 NOTE — ED Provider Notes (Signed)
History     CSN: [id number]  Arrival date & time 10/20/11  2000   First MD Initiated Contact with Patient 10/21/11 0105      Chief Complaint  Patient presents with  . Anxiety    Feeling anxious and can't sleep    (Consider location/radiation/quality/duration/timing/severity/associated sxs/prior treatment) HPI Comments: Patient with worsening anxiety over the last 2-3 weeks. He states he has ongoing life stressors but nothing new. He denies any medication changes and saw his doctor today and was prescribed Xanax. This evening he started to feel very anxious with palpitations and a feeling of doom. He took the Xanax but when he did not feel better within 10-15 minutes he decided to come to the emergency room. He has been waiting in the waiting room for quite some time and states that the medication must have taken because he feels better now.  Patient is a 56 y.o. male presenting with anxiety. The history is provided by the patient.  Anxiety This is a new problem. Episode onset:  2-3 weeks ago. The problem occurs daily. The problem has been gradually worsening. Pertinent negatives include no chest pain, no abdominal pain and no shortness of breath. Exacerbated by:  seems to be worse at night when he lays down to go to sleep. The symptoms are relieved by nothing. Treatments tried:  he shouldn't saw PCP today and was given Xanax. The treatment provided mild relief.    Past Medical History  Diagnosis Date  . Hypertension   . DVT (deep venous thrombosis)   . Psychosexual dysfunction with inhibited sexual excitement   . Gout   . Influenza with pneumonia     Past Surgical History  Procedure Date  . Tonsillectomy and adenoidectomy   . Hand surgery     right    Family History  Problem Relation Age of Onset  . Hypertension Mother   . Alzheimer's disease Mother   . Hypertension Father   . Alcohol abuse Father   . Cancer Sister     "male organs"  . Lung cancer Maternal Uncle   .  Esophageal cancer Maternal Uncle   . Alzheimer's disease Paternal Aunt   . Hypertension Maternal Grandmother   . Alzheimer's disease Maternal Grandmother   . Hypertension Maternal Grandfather   . Hypertension Paternal Grandmother   . Hypertension Paternal Grandfather     History  Substance Use Topics  . Smoking status: Never Smoker   . Smokeless tobacco: Never Used  . Alcohol Use: Yes     ocass- once or twice a month      Review of Systems  Constitutional: Negative for fever, chills and fatigue.  Respiratory: Positive for cough. Negative for chest tightness and shortness of breath.   Cardiovascular: Negative for chest pain, palpitations and leg swelling.  Gastrointestinal: Negative for nausea, abdominal pain and diarrhea.  All other systems reviewed and are negative.    Allergies  Penicillins  Home Medications   Current Outpatient Rx  Name Route Sig Dispense Refill  . ALPRAZOLAM 0.25 MG PO TABS Oral Take 1 tablet (0.25 mg total) by mouth 2 (two) times daily as needed for sleep. 30 tablet 0  . AMLODIPINE BESYLATE 5 MG PO TABS  TAKE 1 TABLET DAILY 30 tablet 5  . DM-GUAIFENESIN ER 30-600 MG PO TB12 Oral Take 1 tablet by mouth every 12 (twelve) hours.    [name] DIOVAN 160 MG PO TABS  TAKE 1 TABLET BY MOUTH TWICE DAILY 60 tablet 5  .  ESCITALOPRAM OXALATE 10 MG PO TABS Oral Take 1 tablet (10 mg total) by mouth daily. 30 tablet 5  . OMEGA-3 FATTY ACIDS 1000 MG PO CAPS Oral Take 1 g by mouth daily.    [name] FLAXSEED (LINSEED) 1000 MG PO CAPS Oral Take 1 capsule by mouth daily.    [name] TADALAFIL 20 MG PO TABS Oral Take 20 mg by mouth daily as needed.    . WARFARIN SODIUM 5 MG PO TABS      . ENOXAPARIN SODIUM 150 MG/ML [place] SOLN Subcutaneous Inject 1.01 mLs (150 mg total) into the skin 2 (two) times daily. 10 mL 0    Dispense 10 syringes    BP 126/71  Pulse 1  Temp(Src) 97.9 F (36.6 C) (Oral)  Resp 18  SpO2 99%  Physical Exam  Nursing note and vitals reviewed. Constitutional: He is  oriented to person, place, and time. He appears well-developed and well-nourished. No distress.  HENT:  Head: Normocephalic and atraumatic.  Mouth/Throat: Oropharynx is clear and moist.  Eyes: Conjunctivae and EOM are normal. Pupils are equal, round, and reactive to light.  Neck: Normal range of motion. Neck supple.  Cardiovascular: Normal rate, regular rhythm and intact distal pulses.   No murmur heard. Pulmonary/Chest: Effort normal and breath sounds normal. No respiratory distress. He has no wheezes. He has no rales.  Abdominal: Soft. He exhibits no distension. There is no tenderness. There is no rebound and no guarding.  Musculoskeletal: Normal range of motion. He exhibits no edema and no tenderness.  Neurological: He is alert and oriented to person, place, and time.  Skin: Skin is warm and dry. No rash noted. No erythema.  Psychiatric: He has a normal mood and affect. His behavior is normal. Thought content normal. His mood appears not anxious. He expresses no homicidal and no suicidal ideation.    ED Course  Procedures (including critical care time)  Labs Reviewed - No data to display Dg Chest 2 View  10/21/2011  *RADIOLOGY REPORT*  Clinical Data: Cough, anxiety  CHEST - 2 VIEW  Comparison: 09/22/2010  Findings: The lungs are essentially clear.  No pleural effusion or pneumothorax.  Cardiomediastinal silhouette is within normal limits.  Degenerative changes of the visualized thoracolumbar spine.  IMPRESSION: No evidence of acute cardiopulmonary disease.  Original Report Authenticated By: Charline Bills, M.D.    Date: 10/21/2011  Rate: 65  Rhythm: normal sinus rhythm  QRS Axis: normal  Intervals: normal  ST/T Wave abnormalities: normal  Conduction Disutrbances:none  Narrative Interpretation:   Old EKG Reviewed: none available    1. Anxiety       MDM   Patient with worsening anxiety over the last several weeks. 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 He denies any new by stressors but states his life is stressful all the time. No recent medication changes in size PCP today with normal TSH, CBC, CMP, INR therapeutic 2. Patient initially was feeling very anxious tonight and took a Xanax which did not help immediately and he decided to come to the emergency room for further evaluation. Since he's been waiting the Xanax has taken effect and he feels much better. States the anxiety and panic seem to occur more in the evenings. In speaking with his wife it sounds as if he may have sleep apnea. She states for the last year his snoring has gotten worse and sometimes he will stop breathing and she has to wake him up and he  will start breathing again. Encouraged him to speak with his regular physician about sleep testing and this may be a trigger for the anxiety he is developed. EKG and chest x-ray are within normal limits. His findings discussed with the patient and all of his normal lab tests from today. He will followup with his doctor on Monday.        Gwyneth Sprout, MD 10/22/11 [phone]

## 2011-10-21 NOTE — Discharge Instructions (Signed)

## 2011-10-23 ENCOUNTER — Telehealth: Payer: Self-pay

## 2011-10-23 NOTE — Telephone Encounter (Signed)
FYI---- msg from patient and he stated he went tot he ED because he had a panic attack this weekend. He stated he is doing better today and the medication that was prescribed is working at this time.

## 2011-11-10 ENCOUNTER — Institutional Professional Consult (permissible substitution): Payer: 59 | Admitting: Pulmonary Disease

## 2011-11-21 ENCOUNTER — Ambulatory Visit (INDEPENDENT_AMBULATORY_CARE_PROVIDER_SITE_OTHER): Payer: 59 | Admitting: Family Medicine

## 2011-11-21 ENCOUNTER — Encounter: Payer: Self-pay | Admitting: Pulmonary Disease

## 2011-11-21 ENCOUNTER — Ambulatory Visit (INDEPENDENT_AMBULATORY_CARE_PROVIDER_SITE_OTHER): Payer: 59 | Admitting: Pulmonary Disease

## 2011-11-21 ENCOUNTER — Encounter: Payer: Self-pay | Admitting: Family Medicine

## 2011-11-21 VITALS — BP 122/72 | HR 82 | Temp 98.2°F | Ht 71.0 in | Wt 331.6 lb

## 2011-11-21 VITALS — BP 120/82 | HR 77 | Temp 98.4°F | Wt 327.8 lb

## 2011-11-21 DIAGNOSIS — G4733 Obstructive sleep apnea (adult) (pediatric): Secondary | ICD-10-CM | POA: Insufficient documentation

## 2011-11-21 DIAGNOSIS — R0609 Other forms of dyspnea: Secondary | ICD-10-CM

## 2011-11-21 DIAGNOSIS — R109 Unspecified abdominal pain: Secondary | ICD-10-CM

## 2011-11-21 DIAGNOSIS — R0683 Snoring: Secondary | ICD-10-CM

## 2011-11-21 DIAGNOSIS — I82409 Acute embolism and thrombosis of unspecified deep veins of unspecified lower extremity: Secondary | ICD-10-CM

## 2011-11-21 DIAGNOSIS — E785 Hyperlipidemia, unspecified: Secondary | ICD-10-CM

## 2011-11-21 DIAGNOSIS — Z86718 Personal history of other venous thrombosis and embolism: Secondary | ICD-10-CM

## 2011-11-21 DIAGNOSIS — F419 Anxiety disorder, unspecified: Secondary | ICD-10-CM

## 2011-11-21 DIAGNOSIS — F411 Generalized anxiety disorder: Secondary | ICD-10-CM

## 2011-11-21 LAB — CBC WITH DIFFERENTIAL/PLATELET
Basophils Relative: 0.4 % (ref 0.0–3.0)
Eosinophils Absolute: 0.1 10*3/uL (ref 0.0–0.7)
Eosinophils Relative: 1.7 % (ref 0.0–5.0)
Lymphocytes Relative: 30.4 % (ref 12.0–46.0)
Monocytes Relative: 7.2 % (ref 3.0–12.0)
Neutrophils Relative %: 60.3 % (ref 43.0–77.0)
RBC: 5.28 Mil/uL (ref 4.22–5.81)
WBC: 6.9 10*3/uL (ref 4.5–10.5)

## 2011-11-21 LAB — LIPID PANEL
HDL: 38.5 mg/dL — ABNORMAL LOW (ref >39.00)
Total CHOL/HDL Ratio: 4

## 2011-11-21 LAB — BASIC METABOLIC PANEL
Calcium: 8.7 mg/dL (ref 8.4–10.5)
Creatinine, Ser: 0.9 mg/dL (ref 0.4–1.5)
GFR: 106.29 mL/min (ref >60.00)
Sodium: 140 mEq/L (ref 135–145)

## 2011-11-21 LAB — POCT URINALYSIS DIPSTICK
Clarity, UA: NEGATIVE
Leukocytes, UA: NEGATIVE
Nitrite, UA: NEGATIVE
pH, UA: 6

## 2011-11-21 LAB — HEPATIC FUNCTION PANEL
ALT: 16 U/L (ref 0–53)
AST: 16 U/L (ref 0–37)
Alkaline Phosphatase: 43 U/L (ref 39–117)
Bilirubin, Direct: 0 mg/dL (ref 0.0–0.3)
Total Protein: 7.3 g/dL (ref 6.0–8.3)

## 2011-11-21 LAB — POCT INR: INR: 3.2

## 2011-11-21 MED ORDER — ALPRAZOLAM 0.25 MG PO TABS: 0.2500 mg | ORAL_TABLET | Freq: Two times a day (BID) | ORAL | Status: AC | PRN

## 2011-11-21 NOTE — Progress Notes (Signed)
  Subjective:    Patient ID: Daniel Powers, male    DOB: [birth year], 56 y.o.   MRN: [id number]  HPI The pt is a 56y/o male who I have been asked to see for possible osa.  He has been noted to have loud snoring by his wife, as well as an abnormal breathing pattern during sleep.  He typically will awaken one to 2 times a night at least to go to the bathroom, but feels that he is fairly rested about 80% of the time in the mornings.  He feels that his alertness is adequate during the day with periods of inactivity, but on rare occasions may fall asleep while working on his computer.  He denies any alertness issues in the evenings watching television.  He will have some sleep pressure driving longer distances at times.  The patient states that his weight is up 20 pounds over the last few years, and his Epworth sleepiness score is abnormal today at 11.  Sleep Questionnaire: What time do you typically go to bed?( Between what hours) 11:00 pm to 1:00 am How long does it take you to fall asleep? 5 mins How many times during the night do you wake up? 2 What time do you get out of bed to start your day? 0730 Do you drive or operate heavy machinery in your occupation? No How much has your weight changed (up or down) over the past two years? (In pounds) 20 lb (9.072 kg) Have you ever had a sleep study before? No Do you currently use CPAP? No Do you wear oxygen at any time? No    Review of Systems  Constitutional: Negative for fever and unexpected weight change.  HENT: Negative for ear pain, nosebleeds, congestion, sore throat, rhinorrhea, sneezing, trouble swallowing, dental problem, postnasal drip and sinus pressure.   Eyes: Negative for redness and itching.  Respiratory: Negative for cough, chest tightness, shortness of breath and wheezing.   Cardiovascular: Negative for palpitations and leg swelling.  Gastrointestinal: Positive for abdominal pain. Negative for nausea and vomiting.  Genitourinary: Negative for  dysuria.  Musculoskeletal: Negative for joint swelling.  Skin: Negative for rash.  Neurological: Negative for headaches.  Hematological: Does not bruise/bleed easily.  Psychiatric/Behavioral: Negative for dysphoric mood. The patient is nervous/anxious.        Objective:   Physical Exam Constitutional:  Morbidly obese male, no acute distress  HENT:  Nares with large turbinates, deviation to left with obstruction.  Oropharynx without exudate, palate and uvula are moderately elongated.   Eyes:  Perrla, eomi, no scleral icterus  Neck:  No JVD, no TMG  Cardiovascular:  Normal rate, regular rhythm, no rubs or gallops.  No murmurs        Intact distal pulses  Pulmonary :  Normal breath sounds, no stridor or respiratory distress   No rales, rhonchi, or wheezing  Abdominal:  Soft, nondistended, bowel sounds present.  No tenderness noted.   Musculoskeletal:  1+ lower extremity edema noted.  Lymph Nodes:  No cervical lymphadenopathy noted  Skin:  No cyanosis noted  Neurologic:  Appears mildly sleepy, appropriate, moves all 4 extremities without obvious deficit.         Assessment & Plan:

## 2011-11-21 NOTE — Progress Notes (Signed)
  Subjective:     Daniel Powers is a 56 y.o. male who presents for evaluation of abdominal pain. Onset was 6 weeks ago. Symptoms have been unchanged. The pain is described as cramping and deep soreness, and is 3/10 in intensity at present.  It is 8/10 when it acts up.   Pain is located in the costovertebral angle on the right without radiation.  Aggravating factors: bowel movement.  Alleviating factors: time---hurts after BM and eventually subsides . Associated symptoms: occasional loose stool alt with constipation. The patient denies anorexia, arthralagias, belching, chills, constipation, diarrhea, dysuria, fever, flatus, frequency, headache, hematochezia, hematuria, melena, myalgias, nausea, sweats and vomiting. Pt is also here to f/u anxiety--- he states he stopped the lexapro secondary to ED and that the anxiety has almost completely resolved.   A lot of stress has ended which help resolve the anxiety. The patient's history has been marked as reviewed and updated as appropriate.  Review of Systems Pertinent items are noted in HPI.     Objective:    BP 120/82  Pulse 77  Temp(Src) 98.4 F (36.9 C) (Oral)  Wt 327 lb 12.8 oz (148.689 kg)  SpO2 98% General appearance: alert, cooperative, appears stated age and no distress Back: symmetric, no curvature. ROM normal. No CVA tenderness. Abdomen: soft, non-tender; bowel sounds normal; no masses,  no organomegaly Extremities: extremities normal, atraumatic, no cyanosis or edema    Assessment:    Abdominal pain --s/p colonoscopy.   anxiety-- resolved-- off meds,  Has xanax to use prn DVT---on coumadin Plan:    See orders for lab and imaging studies. Further follow-up plans will be based on outcome of lab/imaging studies; see orders.

## 2011-11-21 NOTE — Assessment & Plan Note (Signed)
7.5 mg Tues and sat  5 mg all other days Recheck 2 weeks

## 2011-11-21 NOTE — Assessment & Plan Note (Signed)
The patient's history is suggestive of sleep disordered breathing, and he is clearly obese with a large neck and abnormal upper airway anatomy.  He denies significant daytime symptoms, but I suspect that he is under estimating the impact.  I had long discussion with him about sleep apnea, including its effect on his quality of life and cardiovascular health.  I think he does need to have a sleep study, and the patient is agreeable.

## 2011-11-21 NOTE — Assessment & Plan Note (Signed)
Pt feels the situations that made him anxious have resolved and he doesn't need any medication.

## 2011-11-21 NOTE — Patient Instructions (Signed)
Will set up for a sleep study.  Will arrange followup once the results are available.  

## 2011-11-21 NOTE — Patient Instructions (Signed)
7.5 mg tues and sat and 5 mg all other days--coumadin We will recheck INR in 2 weeks

## 2011-11-22 ENCOUNTER — Ambulatory Visit: Payer: 59 | Admitting: Gastroenterology

## 2011-12-05 ENCOUNTER — Ambulatory Visit (INDEPENDENT_AMBULATORY_CARE_PROVIDER_SITE_OTHER): Payer: 59 | Admitting: *Deleted

## 2011-12-05 VITALS — BP 130/90 | HR 81 | Temp 98.4°F | Wt 332.0 lb

## 2011-12-05 DIAGNOSIS — Z86718 Personal history of other venous thrombosis and embolism: Secondary | ICD-10-CM

## 2011-12-05 DIAGNOSIS — Z7901 Long term (current) use of anticoagulants: Secondary | ICD-10-CM

## 2011-12-05 LAB — POCT INR: INR: 3

## 2011-12-05 NOTE — Patient Instructions (Signed)
Return to office in 2 weeks  Continue current dose: 1 tab daily except 1 1/2 tab on Tues, Sat

## 2011-12-11 ENCOUNTER — Encounter (HOSPITAL_BASED_OUTPATIENT_CLINIC_OR_DEPARTMENT_OTHER): Payer: 59

## 2011-12-21 ENCOUNTER — Ambulatory Visit (INDEPENDENT_AMBULATORY_CARE_PROVIDER_SITE_OTHER): Payer: 59

## 2011-12-21 VITALS — BP 130/90 | HR 88 | Temp 98.5°F | Wt 334.2 lb

## 2011-12-21 DIAGNOSIS — Z7901 Long term (current) use of anticoagulants: Secondary | ICD-10-CM

## 2011-12-21 LAB — POCT INR: INR: 2.8

## 2011-12-21 NOTE — Patient Instructions (Signed)
INR was great. Dr.Lowne would like for you to continue your current dose of coumadin 5 mg everyday excepts Tues and Sat where you would take 7.5 and return to office in 1 month.     KP

## 2011-12-24 ENCOUNTER — Ambulatory Visit (HOSPITAL_BASED_OUTPATIENT_CLINIC_OR_DEPARTMENT_OTHER): Payer: 59 | Attending: Pulmonary Disease

## 2011-12-24 VITALS — Ht 71.0 in | Wt 330.0 lb

## 2011-12-24 DIAGNOSIS — G4733 Obstructive sleep apnea (adult) (pediatric): Secondary | ICD-10-CM

## 2011-12-24 DIAGNOSIS — R0683 Snoring: Secondary | ICD-10-CM

## 2011-12-24 DIAGNOSIS — I491 Atrial premature depolarization: Secondary | ICD-10-CM | POA: Insufficient documentation

## 2011-12-28 ENCOUNTER — Other Ambulatory Visit: Payer: Self-pay | Admitting: Family Medicine

## 2012-01-02 ENCOUNTER — Encounter: Payer: Self-pay | Admitting: Gastroenterology

## 2012-01-02 ENCOUNTER — Telehealth: Payer: Self-pay | Admitting: Pulmonary Disease

## 2012-01-02 ENCOUNTER — Ambulatory Visit (INDEPENDENT_AMBULATORY_CARE_PROVIDER_SITE_OTHER): Payer: 59 | Admitting: Gastroenterology

## 2012-01-02 VITALS — BP 122/74 | HR 80 | Ht 71.0 in | Wt 338.0 lb

## 2012-01-02 DIAGNOSIS — G4733 Obstructive sleep apnea (adult) (pediatric): Secondary | ICD-10-CM

## 2012-01-02 DIAGNOSIS — Z860100 Personal history of colon polyps, unspecified: Secondary | ICD-10-CM | POA: Insufficient documentation

## 2012-01-02 DIAGNOSIS — I491 Atrial premature depolarization: Secondary | ICD-10-CM

## 2012-01-02 DIAGNOSIS — IMO0001 Reserved for inherently not codable concepts without codable children: Secondary | ICD-10-CM

## 2012-01-02 DIAGNOSIS — M7918 Myalgia, other site: Secondary | ICD-10-CM

## 2012-01-02 NOTE — Procedures (Signed)
NAMESCOTTY, WEIGELT NO.:  [id number]  MEDICAL RECORD NO.:  [id number]          PATIENT TYPE:  OUT  LOCATION:  SLEEP CENTER                 FACILITY:  [facility]  PHYSICIAN:  Barbaraann Share, MD,FCCPDATE OF BIRTH:  12-06-[birth year]  DATE OF STUDY:  12/24/2011                           NOCTURNAL POLYSOMNOGRAM  REFERRING PHYSICIAN:  Barbaraann Share, MD,FCCP  REFERRING PHYSICIAN:  Barbaraann Share, MD,FCCP  INDICATION FOR STUDY:  Hypersomnia with sleep apnea.  EPWORTH SLEEPINESS SCORE:  10.  SLEEP ARCHITECTURE:  The patient had total sleep time of 324 minutes with no slow-wave sleep and only 78 minutes of REM.  Sleep onset latency was normal at 6.5 minutes and REM onset was at the upper limits of normal at 113 minutes.  Sleep efficiency was mildly reduced at 85%.  RESPIRATORY DATA:  The patient was found to have 201 apneas and 178 obstructive hypopneas, giving him an apnea/hypopnea index of 70 events per hour.  The events occurred in all body positions and there was loud snoring noted throughout.  OXYGEN DATA:  There was O2 desaturation as low as 70% with the patient's obstructive events.  CARDIAC DATA:  Rare PAC noted, but no clinically significant arrhythmias were seen.  MOVEMENT/PARASOMNIA:  The patient had no significant leg jerks or other abnormal behaviors noted.  IMPRESSION/RECOMMENDATION: 1. Severe obstructive sleep apnea/hypopnea syndrome with an AHI of 70     events per hour and O2 desaturation as low as 70%.  Treatment for     this degree of sleep apnea should focus primarily on weight loss as     well as CPAP 2. Rare premature atrial contraction noted, but no clinically     significant arrhythmias were seen.     Barbaraann Share, MD,FCCP Diplomate, American Board of Sleep Medicine    [facility]  D:  01/02/2012 08:14:35  T:  01/02/2012 08:58:04  Job:  [id number]

## 2012-01-02 NOTE — Assessment & Plan Note (Signed)
Right flank pain is very likely musculoskeletal pain. It is doubtful that he is having pain from visceral disease or kidney disease.  Recommendations #1 local heat, Tylenol or NSAIDs

## 2012-01-02 NOTE — Telephone Encounter (Signed)
LMOMTCB x 1. Patient needs ov to discuss sleep results with KC.

## 2012-01-02 NOTE — Progress Notes (Signed)
History of Present Illness:  [name] has returned for evaluation of right-sided abdominal pain. Following his colonoscopy in March, 2013 he developed pain on his right side. It was actually in the area of his right flank.   Pain worsened with lying on his left side and with twisting or turning. Pain was relieved when he would lie on his right side. No change in bowel habits or dysuria.  Several adenomatous polyps were removed at colonoscopy.    Review of Systems: Pertinent positive and negative review of systems were noted in the above HPI section. All other review of systems were otherwise negative.    Current Medications, Allergies, Past Medical History, Past Surgical History, Family History and Social History were reviewed in [employer] [facility] electronic medical record  Vital signs were reviewed in today's medical record. Physical Exam: General: Well developed , well nourished, no acute distress Head: Normocephalic and atraumatic Eyes:  sclerae anicteric, EOMI Ears: Normal auditory acuity Mouth: No deformity or lesions Lungs: Clear throughout to auscultation Heart: Regular rate and rhythm; no murmurs, rubs or bruits Abdomen: Soft, non tender and non distended. No masses, hepatosplenomegaly or hernias noted. Normal Bowel sounds Rectal:deferred Musculoskeletal: Symmetrical with no gross deformities. There is minimal tenderness to palpation in the right flank and lumbar right posterior ribs Pulses:  Normal pulses noted Extremities: No clubbing, cyanosis, edema or deformities noted Neurological: Alert oriented x 4, grossly nonfocal Psychological:  Alert and cooperative. Normal mood and affect

## 2012-01-02 NOTE — Patient Instructions (Addendum)
Follow up as needed

## 2012-01-02 NOTE — Assessment & Plan Note (Signed)
Followup colonoscopy 2018 

## 2012-01-03 NOTE — Telephone Encounter (Signed)
Pt has been scheduled for 01/10/12 @ 4:30pm with Kc to discuss sleep results.

## 2012-01-10 ENCOUNTER — Encounter: Payer: Self-pay | Admitting: Pulmonary Disease

## 2012-01-10 ENCOUNTER — Ambulatory Visit (INDEPENDENT_AMBULATORY_CARE_PROVIDER_SITE_OTHER): Payer: 59 | Admitting: Pulmonary Disease

## 2012-01-10 VITALS — BP 124/72 | HR 81 | Temp 98.5°F | Ht 71.0 in | Wt 342.0 lb

## 2012-01-10 DIAGNOSIS — G4733 Obstructive sleep apnea (adult) (pediatric): Secondary | ICD-10-CM

## 2012-01-10 DIAGNOSIS — R0683 Snoring: Secondary | ICD-10-CM

## 2012-01-10 DIAGNOSIS — R0609 Other forms of dyspnea: Secondary | ICD-10-CM

## 2012-01-10 NOTE — Assessment & Plan Note (Signed)
The patient has beer he severe obstructive sleep apnea by his recent sleep study, and will need aggressive treatment with CPAP and weight loss.  The patient is not enthusiastic about trying CPAP, but is willing to do it short term to see if it helps him. I will set the patient up on cpap at a moderate pressure level to allow for desensitization, and will troubleshoot the device over the next 4-6weeks if needed.  The pt is to call me if having issues with tolerance.  Will then optimize the pressure once patient is able to wear cpap on a consistent basis.

## 2012-01-10 NOTE — Progress Notes (Signed)
  Subjective:    Patient ID: Daniel Powers, male    DOB: [birth year], 56 y.o.   MRN: [id number]  HPI The pt comes in today for f/u of his recent sleep study.  He was found to have severe osa, with an AHI 70/hr, and desaturation to 70%.  I have reviewed the study with him in detail, and answered all of his questions.    Review of Systems  Constitutional: Negative for fever and unexpected weight change.  HENT: Positive for postnasal drip. Negative for ear pain, nosebleeds, congestion, sore throat, rhinorrhea, sneezing, trouble swallowing, dental problem and sinus pressure.   Eyes: Negative for redness and itching.  Respiratory: Negative for cough, chest tightness, shortness of breath and wheezing.   Cardiovascular: Negative for palpitations and leg swelling.  Gastrointestinal: Negative for nausea and vomiting.  Genitourinary: Negative for dysuria.  Musculoskeletal: Negative for joint swelling.  Skin: Negative for rash.  Neurological: Negative for headaches.  Hematological: Does not bruise/bleed easily.  Psychiatric/Behavioral: Negative for dysphoric mood. The patient is not nervous/anxious.        Objective:   Physical Exam Morbidly obese male in nad Nose without purulence or discharge noted LE with mild edema, no cyanosis Appears sleepy, moves all 4.        Assessment & Plan:

## 2012-01-10 NOTE — Patient Instructions (Addendum)
Will start on cpap at a moderate pressure.  Please call if having tolerance issues. Work on weight loss followup with me in 6 weeks.  

## 2012-01-23 ENCOUNTER — Ambulatory Visit: Payer: 59

## 2012-01-30 ENCOUNTER — Ambulatory Visit (INDEPENDENT_AMBULATORY_CARE_PROVIDER_SITE_OTHER): Payer: 59 | Admitting: Internal Medicine

## 2012-01-30 ENCOUNTER — Ambulatory Visit: Payer: 59 | Admitting: Family Medicine

## 2012-01-30 VITALS — BP 120/68 | HR 89 | Temp 98.6°F | Wt 333.0 lb

## 2012-01-30 DIAGNOSIS — J40 Bronchitis, not specified as acute or chronic: Secondary | ICD-10-CM

## 2012-01-30 DIAGNOSIS — Z86718 Personal history of other venous thrombosis and embolism: Secondary | ICD-10-CM

## 2012-01-30 DIAGNOSIS — Z7901 Long term (current) use of anticoagulants: Secondary | ICD-10-CM

## 2012-01-30 DIAGNOSIS — I82409 Acute embolism and thrombosis of unspecified deep veins of unspecified lower extremity: Secondary | ICD-10-CM

## 2012-01-30 MED ORDER — AZITHROMYCIN 250 MG PO TABS: ORAL_TABLET | ORAL | Status: AC

## 2012-01-30 MED ORDER — AZELASTINE HCL 0.1 % NA SOLN: 2.0000 | Freq: Two times a day (BID) | NASAL | Status: DC

## 2012-01-30 NOTE — Progress Notes (Signed)
  Subjective:    Patient ID: PENG THORSTENSON, male    DOB: [birth year], 56 y.o.   MRN: [id number]  HPI Acute visit Developed cough 5 days ago, some sputum production, initially green and no white. He also had some anterior chest pain with cough only, denies shortness of breath. Patient is concerned because he has a history of pneumonia. Also needs his Coumadin check.  Past Medical History  Diagnosis Date  . Hypertension   . DVT (deep venous thrombosis)   . Psychosexual dysfunction with inhibited sexual excitement   . Gout   . Influenza with pneumonia     Review of Systems No fever or chills No nausea or vomiting. He did have diarrhea during the weekend. He has a history of asthma but has not been wheezing lately. Denies any sinus pain but he does have some sinus congestion.     Objective:   Physical Exam General -- alert, well-developed, and overweight appearing. No apparent distress.  HEENT -- TMs R normal, L slt bulge, throat w/o redness, face symmetric and not tender to palpation, nose  congested  Lungs -- normal respiratory effort, no intercostal retractions, no accessory muscle use, and few rhonchi at the bases, no crackles or wheezing.  Heart-- normal rate, regular rhythm, no murmur, and no gallop.   Neurologic-- alert & oriented X3 and strength normal in all extremities. Psych-- Cognition and judgment appear intact. Alert and cooperative with normal attention span and concentration.  not anxious appearing and not depressed appearing.       Assessment & Plan:   Symptoms consistent with bronchitis, see instructions.

## 2012-01-30 NOTE — Patient Instructions (Addendum)
Rest, fluids , tylenol For cough, take Mucinex DM twice a day as needed  For congestion use astelin nasal spray twice a day until you feel better Take the antibiotic as prescribed  (zithromax) Call if no better in few days Call anytime if the symptoms are severe ------ Your Coumadin level  is good, continue with his same Coumadin dose however please come back in 5 days because your levels may be affected by the antibiotic I am prescribing today. Please make an appointment.

## 2012-01-30 NOTE — Assessment & Plan Note (Addendum)
INR today 2.7. Continue with same Coumadin, because he is getting antibiotics, I asked  him to recheck his blood in 5 days

## 2012-01-31 ENCOUNTER — Encounter: Payer: Self-pay | Admitting: Internal Medicine

## 2012-02-05 ENCOUNTER — Ambulatory Visit (HOSPITAL_BASED_OUTPATIENT_CLINIC_OR_DEPARTMENT_OTHER)
Admission: RE | Admit: 2012-02-05 | Discharge: 2012-02-05 | Disposition: A | Discharge status: Home / Self Care | Payer: 59 | Source: Ambulatory Visit | Attending: Family Medicine | Admitting: Family Medicine

## 2012-02-05 ENCOUNTER — Ambulatory Visit (INDEPENDENT_AMBULATORY_CARE_PROVIDER_SITE_OTHER): Payer: 59 | Admitting: Family Medicine

## 2012-02-05 ENCOUNTER — Encounter: Payer: Self-pay | Admitting: Family Medicine

## 2012-02-05 VITALS — BP 130/84 | HR 86 | Temp 98.5°F | Wt 332.2 lb

## 2012-02-05 DIAGNOSIS — R05 Cough: Secondary | ICD-10-CM | POA: Insufficient documentation

## 2012-02-05 DIAGNOSIS — J40 Bronchitis, not specified as acute or chronic: Secondary | ICD-10-CM

## 2012-02-05 DIAGNOSIS — R059 Cough, unspecified: Secondary | ICD-10-CM | POA: Insufficient documentation

## 2012-02-05 DIAGNOSIS — I82409 Acute embolism and thrombosis of unspecified deep veins of unspecified lower extremity: Secondary | ICD-10-CM

## 2012-02-05 MED ORDER — MOXIFLOXACIN HCL 400 MG PO TABS: 400.0000 mg | ORAL_TABLET | Freq: Every day | ORAL | Status: AC

## 2012-02-05 MED ORDER — GUAIFENESIN-CODEINE 100-10 MG/5ML PO SYRP: ORAL_SOLUTION | ORAL | Status: DC

## 2012-02-05 NOTE — Progress Notes (Signed)
  Subjective:     Daniel Powers is a 56 y.o. male here for evaluation of a cough. Onset of symptoms was 10 days ago. Symptoms have been gradually worsening since that time. The cough is productive and is aggravated by exercise, infection and reclining position. Associated symptoms include: shortness of breath, sputum production and wheezing. Patient does have a history of asthma. Patient does have a history of environmental allergens. Patient has not traveled recently. Patient does not have a history of smoking. Patient has not had a previous chest x-ray. Patient has not had a PPD done.  The following portions of the patient's history were reviewed and updated as appropriate: allergies, current medications, past family history, past medical history, past social history, past surgical history and problem list.  Review of Systems Pertinent items are noted in HPI.    Objective:    Oxygen saturation 97% on room air BP 130/84  Pulse 86  Temp 98.5 F (36.9 C) (Oral)  Wt 332 lb 3.2 oz (150.685 kg)  SpO2 97% General appearance: alert, cooperative, appears stated age and no distress Ears: normal TM's and external ear canals both ears Nose: Nares normal. Septum midline. Mucosa normal. No drainage or sinus tenderness. Throat: lips, mucosa, and tongue normal; teeth and gums normal Neck: no adenopathy and thyroid not enlarged, symmetric, no tenderness/mass/nodules Lungs: rhonchi bilaterally Heart: S1, S2 normal    Assessment:    Acute Bronchitis    Plan:    Antibiotics per medication orders. Antitussives per medication orders. Avoid exposure to tobacco smoke and fumes. B-agonist inhaler. Call if shortness of breath worsens, blood in sputum, change in character of cough, development of fever or chills, inability to maintain nutrition and hydration. Avoid exposure to tobacco smoke and fumes. Chest x-ray. Steroid inhaler as ordered.

## 2012-02-05 NOTE — Patient Instructions (Signed)

## 2012-02-06 ENCOUNTER — Telehealth: Payer: Self-pay

## 2012-02-06 NOTE — Telephone Encounter (Signed)
Left message on voicemail with Dr.Lowne's instructions. Patient instructed to call back to confirm that he received instructions and to schedule appointment to recheck on Thursday.

## 2012-02-06 NOTE — Telephone Encounter (Signed)
Dr Laury Axon was this discussed at his OV yesterday?

## 2012-02-06 NOTE — Telephone Encounter (Signed)
Message copied by Maurice Small on Tue Feb 06, 2012  4:49 PM ------      Message from: Lelon Perla      Created: Mon Feb 05, 2012  9:50 AM       Hold today then take 7.5 mg 1 day and 5 mg all other until done with abx.  Recheck wed/thurs

## 2012-02-07 NOTE — Telephone Encounter (Signed)
yes

## 2012-02-12 ENCOUNTER — Ambulatory Visit (INDEPENDENT_AMBULATORY_CARE_PROVIDER_SITE_OTHER): Payer: 59

## 2012-02-12 VITALS — BP 136/82 | HR 94 | Temp 98.8°F | Wt 337.6 lb

## 2012-02-12 DIAGNOSIS — I82409 Acute embolism and thrombosis of unspecified deep veins of unspecified lower extremity: Secondary | ICD-10-CM

## 2012-02-12 NOTE — Patient Instructions (Addendum)
Return to office on Monday  Continue current dose: 1 tab daily until antibiotic finish then resume regular dosing  Taking 5 mg daly except 7.5 on Tues and Sat

## 2012-02-20 ENCOUNTER — Ambulatory Visit (INDEPENDENT_AMBULATORY_CARE_PROVIDER_SITE_OTHER): Payer: 59 | Admitting: *Deleted

## 2012-02-20 VITALS — BP 122/82 | Wt 337.0 lb

## 2012-02-20 DIAGNOSIS — Z7901 Long term (current) use of anticoagulants: Secondary | ICD-10-CM

## 2012-02-20 DIAGNOSIS — Z86718 Personal history of other venous thrombosis and embolism: Secondary | ICD-10-CM

## 2012-02-20 LAB — POCT INR: INR: 2.7

## 2012-02-20 MED ORDER — CLARITHROMYCIN ER 500 MG PO TB24: 1000.0000 mg | ORAL_TABLET | Freq: Every day | ORAL | Status: AC

## 2012-02-20 NOTE — Patient Instructions (Addendum)
Return to office once antibiotic complete.  Continue current dosing: 1 tab daily  Except 1 1/2 sat, Tues

## 2012-02-26 ENCOUNTER — Other Ambulatory Visit: Payer: Self-pay | Admitting: Family Medicine

## 2012-02-27 ENCOUNTER — Ambulatory Visit (INDEPENDENT_AMBULATORY_CARE_PROVIDER_SITE_OTHER): Payer: 59 | Admitting: *Deleted

## 2012-02-27 VITALS — BP 120/86 | HR 78 | Wt 335.0 lb

## 2012-02-27 DIAGNOSIS — Z86718 Personal history of other venous thrombosis and embolism: Secondary | ICD-10-CM

## 2012-02-27 DIAGNOSIS — Z7901 Long term (current) use of anticoagulants: Secondary | ICD-10-CM

## 2012-02-27 LAB — POCT INR: INR: 3

## 2012-02-27 NOTE — Patient Instructions (Signed)
Return to office in 1 week  Continue current dose:  1 tab daily  Except 1 1/2 sat, tues

## 2012-03-08 ENCOUNTER — Ambulatory Visit (INDEPENDENT_AMBULATORY_CARE_PROVIDER_SITE_OTHER): Payer: 59 | Admitting: *Deleted

## 2012-03-08 VITALS — BP 110/76 | HR 79 | Wt 336.0 lb

## 2012-03-08 DIAGNOSIS — Z7901 Long term (current) use of anticoagulants: Secondary | ICD-10-CM

## 2012-03-08 DIAGNOSIS — Z86718 Personal history of other venous thrombosis and embolism: Secondary | ICD-10-CM

## 2012-03-08 NOTE — Patient Instructions (Signed)
Return to office in 1 week  New dosing: 1/2 tab today then 1 tab daily

## 2012-03-22 ENCOUNTER — Ambulatory Visit (INDEPENDENT_AMBULATORY_CARE_PROVIDER_SITE_OTHER): Payer: 59 | Admitting: *Deleted

## 2012-03-22 VITALS — BP 122/82 | HR 77 | Wt 335.0 lb

## 2012-03-22 DIAGNOSIS — Z7901 Long term (current) use of anticoagulants: Secondary | ICD-10-CM

## 2012-03-22 DIAGNOSIS — Z86718 Personal history of other venous thrombosis and embolism: Secondary | ICD-10-CM

## 2012-03-22 LAB — POCT INR: INR: 2

## 2012-03-22 NOTE — Patient Instructions (Addendum)
Return to office in 2 weeks  Continue current dose:  1 tab daily (5 mg)

## 2012-04-05 ENCOUNTER — Ambulatory Visit (INDEPENDENT_AMBULATORY_CARE_PROVIDER_SITE_OTHER): Payer: 59

## 2012-04-05 VITALS — BP 126/76 | HR 79 | Temp 98.5°F | Wt 336.4 lb

## 2012-04-05 DIAGNOSIS — Z7901 Long term (current) use of anticoagulants: Secondary | ICD-10-CM

## 2012-04-05 DIAGNOSIS — I82409 Acute embolism and thrombosis of unspecified deep veins of unspecified lower extremity: Secondary | ICD-10-CM

## 2012-04-05 LAB — POCT INR: INR: 2.5

## 2012-04-05 NOTE — Patient Instructions (Addendum)
INR was 2.5 today which was good. Continue your current dose of 5 mg and repeat in 1 month or sooner as needed.      ST

## 2012-04-09 ENCOUNTER — Other Ambulatory Visit: Payer: Self-pay

## 2012-04-09 MED ORDER — WARFARIN SODIUM 5 MG PO TABS: 5.0000 mg | ORAL_TABLET | ORAL | Status: DC

## 2012-04-09 MED ORDER — VALSARTAN 160 MG PO TABS: 160.0000 mg | ORAL_TABLET | Freq: Two times a day (BID) | ORAL | Status: DC

## 2012-04-09 NOTE — Telephone Encounter (Signed)
Rx request from the pharmacy-- Faxed

## 2012-04-28 ENCOUNTER — Other Ambulatory Visit: Payer: Self-pay | Admitting: Family Medicine

## 2012-04-29 ENCOUNTER — Other Ambulatory Visit: Payer: Self-pay | Admitting: Family Medicine

## 2012-04-29 NOTE — Telephone Encounter (Signed)
Refill done.  

## 2012-04-29 NOTE — Telephone Encounter (Signed)
Last filled 04/09/12 #60 x 1. Last seen 02/08/12.  Showing Rx on history don't know if you d/c med or what.  Plz advise      MW

## 2012-05-07 ENCOUNTER — Ambulatory Visit (INDEPENDENT_AMBULATORY_CARE_PROVIDER_SITE_OTHER): Payer: 59

## 2012-05-07 VITALS — BP 114/60 | HR 88 | Temp 98.7°F | Wt 334.8 lb

## 2012-05-07 DIAGNOSIS — Z86718 Personal history of other venous thrombosis and embolism: Secondary | ICD-10-CM

## 2012-05-07 NOTE — Patient Instructions (Addendum)
INR was 2.3 today which was good. Continue your current dose of 5 mg and repeat in 1 month or sooner as needed.              KP

## 2012-06-11 ENCOUNTER — Ambulatory Visit (INDEPENDENT_AMBULATORY_CARE_PROVIDER_SITE_OTHER): Payer: 59

## 2012-06-11 VITALS — BP 124/76 | HR 80 | Wt 337.6 lb

## 2012-06-11 DIAGNOSIS — Z86718 Personal history of other venous thrombosis and embolism: Secondary | ICD-10-CM

## 2012-06-11 DIAGNOSIS — Z23 Encounter for immunization: Secondary | ICD-10-CM

## 2012-06-11 NOTE — Patient Instructions (Addendum)
INR today was 3.0 and Dr.Lowne suggest that you continue to take 5 mg coumadin daily and recheck in 1 month or sooner if needed.       KP

## 2012-06-24 ENCOUNTER — Other Ambulatory Visit: Payer: Self-pay | Admitting: Family Medicine

## 2012-07-18 ENCOUNTER — Ambulatory Visit: Payer: 59

## 2012-07-23 ENCOUNTER — Ambulatory Visit (INDEPENDENT_AMBULATORY_CARE_PROVIDER_SITE_OTHER): Payer: 59 | Admitting: Family Medicine

## 2012-07-23 ENCOUNTER — Encounter: Payer: Self-pay | Admitting: Family Medicine

## 2012-07-23 VITALS — BP 132/74 | HR 88 | Temp 98.4°F | Wt 334.6 lb

## 2012-07-23 DIAGNOSIS — Z7901 Long term (current) use of anticoagulants: Secondary | ICD-10-CM

## 2012-07-23 DIAGNOSIS — J40 Bronchitis, not specified as acute or chronic: Secondary | ICD-10-CM

## 2012-07-23 DIAGNOSIS — J4521 Mild intermittent asthma with (acute) exacerbation: Secondary | ICD-10-CM

## 2012-07-23 DIAGNOSIS — J45901 Unspecified asthma with (acute) exacerbation: Secondary | ICD-10-CM

## 2012-07-23 DIAGNOSIS — Z86718 Personal history of other venous thrombosis and embolism: Secondary | ICD-10-CM

## 2012-07-23 MED ORDER — ALBUTEROL SULFATE HFA 108 (90 BASE) MCG/ACT IN AERS: 2.0000 | INHALATION_SPRAY | Freq: Four times a day (QID) | RESPIRATORY_TRACT | Status: DC | PRN

## 2012-07-23 MED ORDER — METHYLPREDNISOLONE ACETATE 80 MG/ML IJ SUSP
80.0000 mg | Freq: Once | INTRAMUSCULAR | Status: AC
  Administered 2012-07-23: 80 mg via INTRAMUSCULAR

## 2012-07-23 MED ORDER — AZITHROMYCIN 250 MG PO TABS: ORAL_TABLET | ORAL | Status: DC

## 2012-07-23 MED ORDER — ALBUTEROL SULFATE (2.5 MG/3ML) 0.083% IN NEBU
2.5000 mg | INHALATION_SOLUTION | Freq: Once | RESPIRATORY_TRACT | Status: AC
  Administered 2012-07-23: 2.5 mg via RESPIRATORY_TRACT

## 2012-07-23 MED ORDER — PREDNISONE 10 MG PO TABS: ORAL_TABLET | ORAL | Status: DC

## 2012-07-23 NOTE — Addendum Note (Signed)
Addended by: Arnette Norris on: 07/23/2012 04:35 PM   Modules accepted: Orders

## 2012-07-23 NOTE — Progress Notes (Signed)
  Subjective:     Daniel Powers is a 57 y.o. male here for evaluation of a cough. Onset of symptoms was 5 days ago. Symptoms have been gradually worsening since that time. The cough is productive and is aggravated by cold air, exercise, infection and reclining position. Associated symptoms include: postnasal drip, shortness of breath, sputum production and wheezing. Patient does have a history of asthma. Patient does have a history of environmental allergens. Patient has not traveled recently. Patient does not have a history of smoking. Patient has not had a previous chest x-ray. Patient has not had a PPD done.  The following portions of the patient's history were reviewed and updated as appropriate: allergies, current medications, past family history, past medical history, past social history, past surgical history and problem list.  Review of Systems Pertinent items are noted in HPI.    Objective:    Oxygen saturation 96% on room air BP 132/74  Pulse 88  Temp 98.4 F (36.9 C) (Oral)  Wt 334 lb 9.6 oz (151.774 kg)  SpO2 96% General appearance: alert, cooperative, appears stated age and no distress Ears: normal TM's and external ear canals both ears Nose: Nares normal. Septum midline. Mucosa normal. No drainage or sinus tenderness. Throat: lips, mucosa, and tongue normal; teeth and gums normal Neck: mild anterior cervical adenopathy, supple, symmetrical, trachea midline and thyroid not enlarged, symmetric, no tenderness/mass/nodules Lungs: wheezes bilaterally Heart: S1, S2 normal    Assessment:    Acute Bronchitis and Asthma    Plan:    Antibiotics per medication orders. Antitussives per medication orders. Avoid exposure to tobacco smoke and fumes. B-agonist inhaler. Call if shortness of breath worsens, blood in sputum, change in character of cough, development of fever or chills, inability to maintain nutrition and hydration. Avoid exposure to tobacco smoke and fumes. depo  medrol and pred taper

## 2012-07-23 NOTE — Patient Instructions (Addendum)

## 2012-07-23 NOTE — Assessment & Plan Note (Signed)
rto 1 week to recheck [facility]

## 2012-08-01 ENCOUNTER — Ambulatory Visit (INDEPENDENT_AMBULATORY_CARE_PROVIDER_SITE_OTHER): Payer: 59 | Admitting: *Deleted

## 2012-08-01 VITALS — BP 122/86 | HR 71 | Wt 325.0 lb

## 2012-08-01 DIAGNOSIS — Z86718 Personal history of other venous thrombosis and embolism: Secondary | ICD-10-CM

## 2012-08-01 DIAGNOSIS — Z7901 Long term (current) use of anticoagulants: Secondary | ICD-10-CM

## 2012-08-01 LAB — POCT INR: INR: 2.2

## 2012-08-01 NOTE — Patient Instructions (Signed)
Return to office in 4 weeks  Continue current dose: 1 tab (5 mg) daily

## 2012-08-17 ENCOUNTER — Other Ambulatory Visit: Payer: Self-pay | Admitting: Family Medicine

## 2012-08-18 ENCOUNTER — Other Ambulatory Visit: Payer: Self-pay | Admitting: Family Medicine

## 2012-08-29 ENCOUNTER — Ambulatory Visit: Payer: 59

## 2012-09-17 ENCOUNTER — Other Ambulatory Visit: Payer: Self-pay | Admitting: Family Medicine

## 2012-10-02 ENCOUNTER — Ambulatory Visit: Payer: 59

## 2012-10-04 ENCOUNTER — Ambulatory Visit (INDEPENDENT_AMBULATORY_CARE_PROVIDER_SITE_OTHER): Payer: 59 | Admitting: Family Medicine

## 2012-10-04 ENCOUNTER — Encounter: Payer: Self-pay | Admitting: Family Medicine

## 2012-10-04 VITALS — BP 130/86 | HR 93 | Temp 98.4°F | Wt 331.0 lb

## 2012-10-04 DIAGNOSIS — Z7901 Long term (current) use of anticoagulants: Secondary | ICD-10-CM

## 2012-10-04 DIAGNOSIS — M549 Dorsalgia, unspecified: Secondary | ICD-10-CM

## 2012-10-04 DIAGNOSIS — Z86718 Personal history of other venous thrombosis and embolism: Secondary | ICD-10-CM

## 2012-10-04 MED ORDER — TRAMADOL HCL 50 MG PO TABS: 50.0000 mg | ORAL_TABLET | Freq: Three times a day (TID) | ORAL | Status: DC | PRN

## 2012-10-04 MED ORDER — CYCLOBENZAPRINE HCL 10 MG PO TABS: 10.0000 mg | ORAL_TABLET | Freq: Three times a day (TID) | ORAL | Status: DC | PRN

## 2012-10-04 NOTE — Progress Notes (Signed)
  Subjective:    Daniel Powers is a 57 y.o. male who presents for evaluation of low back pain. The patient has had recurrent self limited episodes of low back pain in the past. Symptoms have been present for 4 weeks and are gradually improving.  Onset was related to / precipitated by lifting a heavy object.--40lb bag of ice. The pain is located in the waist or r side and radiates to the right thigh, right lower leg. The pain is described as sharp and occurs intermittently. He rates his pain as moderate. Symptoms are exacerbated by flexion, lifting and standing. Symptoms are improved by acetaminophen and NSAIDs. He has also tried nothing which provided no symptom relief. He has no other symptoms associated with the back pain. The patient has no "red flag" history indicative of complicated back pain.  The following portions of the patient's history were reviewed and updated as appropriate: allergies, current medications, past family history, past medical history, past social history, past surgical history and problem list.  Review of Systems Pertinent items are noted in HPI.    Objective:   Full range of motion without pain, no tenderness, no spasm, no curvature. Normal reflexes, gait, strength and negative straight-leg raise.    Assessment:    Nonspecific acute low back pain    Plan:    Natural history and expected course discussed. Questions answered. Agricultural engineer distributed. Stretching exercises discussed. Regular aerobic and trunk strengthening exercises discussed. Short (2-4 day) period of relative rest recommended until acute symptoms improve. Ice to affected area as needed for local pain relief. Heat to affected area as needed for local pain relief. Muscle relaxants per medication orders. ultram prn  Rto 2 weeks or sooner prn

## 2012-10-04 NOTE — Assessment & Plan Note (Signed)
PT/ Inr done con't coumadin and rto 1 month

## 2012-10-04 NOTE — Patient Instructions (Signed)
Back Pain, Adult Low back pain is very common. About 1 in 5 people have back pain.The cause of low back pain is rarely dangerous. The pain often gets better over time.About half of people with a sudden onset of back pain feel better in just 2 weeks. About 8 in 10 people feel better by 6 weeks.  CAUSES Some common causes of back pain include:  Strain of the muscles or ligaments supporting the spine.  Wear and tear (degeneration) of the spinal discs.  Arthritis.  Direct injury to the back. DIAGNOSIS Most of the time, the direct cause of low back pain is not known.However, back pain can be treated effectively even when the exact cause of the pain is unknown.Answering your caregiver's questions about your overall health and symptoms is one of the most accurate ways to make sure the cause of your pain is not dangerous. If your caregiver needs more information, he or she may order lab work or imaging tests (X-rays or MRIs).However, even if imaging tests show changes in your back, this usually does not require surgery. HOME CARE INSTRUCTIONS For many people, back pain returns.Since low back pain is rarely dangerous, it is often a condition that people can learn to manageon their own.   Remain active. It is stressful on the back to sit or stand in one place. Do not sit, drive, or stand in one place for more than 30 minutes at a time. Take short walks on level surfaces as soon as pain allows.Try to increase the length of time you walk each day.  Do not stay in bed.Resting more than 1 or 2 days can delay your recovery.  Do not avoid exercise or work.Your body is made to move.It is not dangerous to be active, even though your back may hurt.Your back will likely heal faster if you return to being active before your pain is gone.  Pay attention to your body when you bend and lift. Many people have less discomfortwhen lifting if they bend their knees, keep the load close to their bodies,and  avoid twisting. Often, the most comfortable positions are those that put less stress on your recovering back.  Find a comfortable position to sleep. Use a firm mattress and lie on your side with your knees slightly bent. If you lie on your back, put a pillow under your knees.  Only take over-the-counter or prescription medicines as directed by your caregiver. Over-the-counter medicines to reduce pain and inflammation are often the most helpful.Your caregiver may prescribe muscle relaxant drugs.These medicines help dull your pain so you can more quickly return to your normal activities and healthy exercise.  Put ice on the injured area.  Put ice in a plastic bag.  Place a towel between your skin and the bag.  Leave the ice on for 15 to 20 minutes, 3 to 4 times a day for the first 2 to 3 days. After that, ice and heat may be alternated to reduce pain and spasms.  Ask your caregiver about trying back exercises and gentle massage. This may be of some benefit.  Avoid feeling anxious or stressed.Stress increases muscle tension and can worsen back pain.It is important to recognize when you are anxious or stressed and learn ways to manage it.Exercise is a great option. SEEK MEDICAL CARE IF:  You have pain that is not relieved with rest or medicine.  You have pain that does not improve in 1 week.  You have new symptoms.  You are generally   not feeling well. SEEK IMMEDIATE MEDICAL CARE IF:   You have pain that radiates from your back into your legs.  You develop new bowel or bladder control problems.  You have unusual weakness or numbness in your arms or legs.  You develop nausea or vomiting.  You develop abdominal pain.  You feel faint. Document Released: 07/03/2005 Document Revised: 01/02/2012 Document Reviewed: 11/21/2010 [employer] Patient Information 2013 [employer].  

## 2012-10-15 ENCOUNTER — Other Ambulatory Visit: Payer: Self-pay | Admitting: Family Medicine

## 2012-10-21 ENCOUNTER — Encounter: Payer: Self-pay | Admitting: Lab

## 2012-10-22 ENCOUNTER — Ambulatory Visit (INDEPENDENT_AMBULATORY_CARE_PROVIDER_SITE_OTHER): Payer: 59 | Admitting: Family Medicine

## 2012-10-22 ENCOUNTER — Encounter: Payer: Self-pay | Admitting: Family Medicine

## 2012-10-22 VITALS — BP 128/70 | HR 98 | Temp 98.5°F | Wt 331.0 lb

## 2012-10-22 DIAGNOSIS — M545 Low back pain, unspecified: Secondary | ICD-10-CM

## 2012-10-22 DIAGNOSIS — E785 Hyperlipidemia, unspecified: Secondary | ICD-10-CM

## 2012-10-22 DIAGNOSIS — Z0000 Encounter for general adult medical examination without abnormal findings: Secondary | ICD-10-CM

## 2012-10-22 DIAGNOSIS — I82409 Acute embolism and thrombosis of unspecified deep veins of unspecified lower extremity: Secondary | ICD-10-CM

## 2012-10-22 DIAGNOSIS — I10 Essential (primary) hypertension: Secondary | ICD-10-CM

## 2012-10-22 DIAGNOSIS — M109 Gout, unspecified: Secondary | ICD-10-CM

## 2012-10-22 LAB — CBC WITH DIFFERENTIAL/PLATELET
Basophils Absolute: 0 10*3/uL (ref 0.0–0.1)
Eosinophils Absolute: 0.1 10*3/uL (ref 0.0–0.7)
Hemoglobin: 14.2 g/dL (ref 13.0–17.0)
Lymphocytes Relative: 30.7 % (ref 12.0–46.0)
MCHC: 32.8 g/dL (ref 30.0–36.0)
MCV: 80.9 fl (ref 78.0–100.0)
Monocytes Absolute: 0.5 10*3/uL (ref 0.1–1.0)
Neutro Abs: 3.5 10*3/uL (ref 1.4–7.7)
RDW: 15.8 % — ABNORMAL HIGH (ref 11.5–14.6)

## 2012-10-22 LAB — POCT URINALYSIS DIPSTICK
Glucose, UA: NEGATIVE
Ketones, UA: NEGATIVE
Leukocytes, UA: NEGATIVE
Protein, UA: NEGATIVE
Spec Grav, UA: >=1.03
Urobilinogen, UA: 0.2

## 2012-10-22 LAB — LIPID PANEL
Cholesterol: 168 mg/dL (ref 0–200)
HDL: 29.4 mg/dL — ABNORMAL LOW (ref >39.00)
Triglycerides: 131 mg/dL (ref 0.0–149.0)
VLDL: 26.2 mg/dL (ref 0.0–40.0)

## 2012-10-22 LAB — BASIC METABOLIC PANEL
CO2: 24 mEq/L (ref 19–32)
Calcium: 8.9 mg/dL (ref 8.4–10.5)
Chloride: 105 mEq/L (ref 96–112)
Creatinine, Ser: 1 mg/dL (ref 0.4–1.5)
Glucose, Bld: 101 mg/dL — ABNORMAL HIGH (ref 70–99)
Sodium: 138 mEq/L (ref 135–145)

## 2012-10-22 LAB — MICROALBUMIN / CREATININE URINE RATIO
Creatinine,U: 231.9 mg/dL
Microalb, Ur: 1.3 mg/dL (ref 0.0–1.9)

## 2012-10-22 LAB — HEPATIC FUNCTION PANEL
Albumin: 3.7 g/dL (ref 3.5–5.2)
Alkaline Phosphatase: 50 U/L (ref 39–117)
Total Protein: 6.7 g/dL (ref 6.0–8.3)

## 2012-10-22 LAB — TSH: TSH: 0.78 u[IU]/mL (ref 0.35–5.50)

## 2012-10-22 NOTE — Assessment & Plan Note (Signed)
Check uric acid. 

## 2012-10-22 NOTE — Progress Notes (Signed)
  Subjective:    Daniel Powers is a 57 y.o. male here for follow up of dyslipidemia. The patient does not use medications that may worsen dyslipidemias (corticosteroids, progestins, anabolic steroids, diuretics, beta-blockers, amiodarone, cyclosporine, olanzapine). The patient exercises infrequently. The patient is not known to have coexisting coronary artery disease.   Cardiac Risk Factors Age > 45-male, > 55-male:  YES  +1  Smoking:   NO  Sig. family hx of CHD*:  NO  Hypertension:   YES  +1  Diabetes:   NO  HDL < 35:   NO  HDL > 59:   NO  Total: 2   *- Sig. family h/o CHD per NCEP = MI or sudden death at <55yo in  father or other 1st-degree male relative, or <65yo in mother or  other 1st-degree male relative  The following portions of the patient's history were reviewed and updated as appropriate: allergies, current medications, past family history, past medical history, past social history, past surgical history and problem list.  Review of Systems Pertinent items are noted in HPI.    Objective:    BP 128/70  Pulse 98  Temp(Src) 98.5 F (36.9 C) (Oral)  Wt 331 lb (150.141 kg)  BMI 46.19 kg/m2  SpO2 95% General appearance: alert, cooperative, appears stated age and no distress Lungs: clear to auscultation bilaterally Heart: S1, S2 normal Extremities: extremities normal, atraumatic, no cyanosis or edema  Lab Review Lab Results  Component Value Date   CHOL 168 11/21/2011   CHOL 171 08/29/2011   CHOL 157 09/12/2010   HDL 38.50* 11/21/2011   HDL 16.10* 08/29/2011   HDL 27.60* 09/12/2010      Assessment:    Dyslipidemia as detailed above with 3 CHD risk factors using NCEP scheme above.  Target levels for LDL are: < 100 mg/dl (CHD or "CHD risk equivalent" is present)  Explained to the patient the respective contributions of genetics, diet, and exercise to lipid levels and the use of medication in severe cases which do not respond to lifestyle alteration. The patient's  interest and motivation in making lifestyle changes seems fair.    Plan:    The following changes are planned for the next 6 months, at which time the patient will return for repeat fasting lipids:  1. Dietary changes: Reduce saturated fat, "trans" monounsaturated fatty acids, and cholesterol 2. Exercise changes:  inc exercise 3. Other treatment: Treatment of hypertension (cont med) Weight reduction (diet and exercise) 4. Lipid-lowering medications: None at present. Will consider if LDL > 100 on recheck.  (Recommended by NCEP after 3-6 mos of dietary therapy & lifestyle modification,  except if CHD is present or LDL well above 190.) 5. Hormone replacement therapy (patient is a postmenopausal  woman): no 6. Screening for secondary causes of dyslipidemias: None indicated 7. Lipid screening for relatives: na 8. Follow up: 6 months.  Note: The majority of the visit was spent in counseling on the pathophysiology and treatment of dyslipidemias. The total face-to-face time was in excess of 20 minutes.

## 2012-10-22 NOTE — Assessment & Plan Note (Signed)
resolved 

## 2012-10-22 NOTE — Assessment & Plan Note (Signed)
Check labs 

## 2012-10-22 NOTE — Assessment & Plan Note (Signed)
Stable con't meds 

## 2012-10-22 NOTE — Assessment & Plan Note (Signed)
PT INR  In range Recheck 1 month

## 2012-10-22 NOTE — Patient Instructions (Addendum)

## 2012-11-08 ENCOUNTER — Telehealth: Payer: Self-pay | Admitting: General Practice

## 2012-11-08 NOTE — Telephone Encounter (Signed)
Pt called and left message to call back on triage line. Tried to call back and no answer, will call again on Monday.

## 2012-11-11 NOTE — Telephone Encounter (Signed)
Pt stated he was returning a call to our office. I do not see a phone note in his chart and there were no lab results. Pt notified that his next appt is on 11/20/12.

## 2012-11-14 ENCOUNTER — Encounter: Payer: Self-pay | Admitting: Family Medicine

## 2012-11-20 ENCOUNTER — Ambulatory Visit (INDEPENDENT_AMBULATORY_CARE_PROVIDER_SITE_OTHER): Payer: 59 | Admitting: Family Medicine

## 2012-11-20 ENCOUNTER — Encounter: Payer: Self-pay | Admitting: Family Medicine

## 2012-11-20 VITALS — BP 118/76 | HR 88 | Temp 98.3°F | Ht 73.0 in | Wt 332.6 lb

## 2012-11-20 DIAGNOSIS — E785 Hyperlipidemia, unspecified: Secondary | ICD-10-CM

## 2012-11-20 DIAGNOSIS — M109 Gout, unspecified: Secondary | ICD-10-CM

## 2012-11-20 DIAGNOSIS — I10 Essential (primary) hypertension: Secondary | ICD-10-CM

## 2012-11-20 DIAGNOSIS — F528 Other sexual dysfunction not due to a substance or known physiological condition: Secondary | ICD-10-CM

## 2012-11-20 DIAGNOSIS — I82401 Acute embolism and thrombosis of unspecified deep veins of right lower extremity: Secondary | ICD-10-CM

## 2012-11-20 DIAGNOSIS — I82409 Acute embolism and thrombosis of unspecified deep veins of unspecified lower extremity: Secondary | ICD-10-CM

## 2012-11-20 DIAGNOSIS — Z0000 Encounter for general adult medical examination without abnormal findings: Secondary | ICD-10-CM

## 2012-11-20 LAB — LIPID PANEL
HDL: 31.9 mg/dL — ABNORMAL LOW (ref >39.00)
LDL Cholesterol: 116 mg/dL — ABNORMAL HIGH (ref 0–99)
Total CHOL/HDL Ratio: 5
Triglycerides: 80 mg/dL (ref 0.0–149.0)
VLDL: 16 mg/dL (ref 0.0–40.0)

## 2012-11-20 LAB — CBC WITH DIFFERENTIAL/PLATELET
Basophils Absolute: 0 10*3/uL (ref 0.0–0.1)
Eosinophils Absolute: 0.1 10*3/uL (ref 0.0–0.7)
Lymphocytes Relative: 23.4 % (ref 12.0–46.0)
MCHC: 32.6 g/dL (ref 30.0–36.0)
MCV: 81.9 fl (ref 78.0–100.0)
Monocytes Absolute: 0.6 10*3/uL (ref 0.1–1.0)
Neutrophils Relative %: 67.4 % (ref 43.0–77.0)
Platelets: 261 10*3/uL (ref 150.0–400.0)
RDW: 15.6 % — ABNORMAL HIGH (ref 11.5–14.6)

## 2012-11-20 LAB — BASIC METABOLIC PANEL
BUN: 15 mg/dL (ref 6–23)
CO2: 24 mEq/L (ref 19–32)
Calcium: 8.7 mg/dL (ref 8.4–10.5)
Chloride: 106 mEq/L (ref 96–112)
Creatinine, Ser: 1 mg/dL (ref 0.4–1.5)

## 2012-11-20 LAB — HEPATIC FUNCTION PANEL
Bilirubin, Direct: 0.1 mg/dL (ref 0.0–0.3)
Total Bilirubin: 0.5 mg/dL (ref 0.3–1.2)
Total Protein: 6.5 g/dL (ref 6.0–8.3)

## 2012-11-20 LAB — URIC ACID: Uric Acid, Serum: 4.4 mg/dL (ref 4.0–7.8)

## 2012-11-20 LAB — TSH: TSH: 0.65 u[IU]/mL (ref 0.35–5.50)

## 2012-11-20 NOTE — Progress Notes (Signed)
Subjective:    Patient ID: Daniel Powers, male    DOB: Nov 27, 1955, 57 y.o.   MRN: [id number]  HPI Pt here for cpe and labs.  No complaints.  Past Medical History  Diagnosis Date  . Hypertension   . DVT (deep venous thrombosis)   . Psychosexual dysfunction with inhibited sexual excitement   . Gout   . Influenza with pneumonia    Family History  Problem Relation Age of Onset  . Hypertension Mother   . Alzheimer's disease Mother   . Hypertension Father   . Alcohol abuse Father   . Cancer Sister     "male organs"  . Lung cancer Maternal Uncle   . Esophageal cancer Maternal Uncle   . Alzheimer's disease Paternal Aunt   . Hypertension Maternal Grandmother   . Alzheimer's disease Maternal Grandmother   . Hypertension Maternal Grandfather   . Hypertension Paternal Grandmother   . Hypertension Paternal Grandfather   . Heart disease Brother 41    MI   Past Surgical History  Procedure Laterality Date  . Tonsillectomy and adenoidectomy    . Hand surgery      right   Current outpatient prescriptions:albuterol (PROAIR HFA) 108 (90 BASE) MCG/ACT inhaler, Inhale 2 puffs into the lungs every 6 (six) hours as needed for wheezing., Disp: 1 Inhaler, Rfl: 2;  ALPRAZolam (XANAX) 0.5 MG tablet, Take 0.5 mg by mouth at bedtime as needed., Disp: , Rfl: ;  amLODipine (NORVASC) 5 MG tablet, TAKE 1 TABLET BY MOUTH DAILY, Disp: 90 tablet, Rfl: 0;  DIOVAN 160 MG tablet, TAKE 1 TABLET BY MOUTH TWICE DAILY, Disp: 60 tablet, Rfl: 0 fish oil-omega-3 fatty acids 1000 MG capsule, Take 1 g by mouth daily., Disp: , Rfl: ;  Flaxseed, Linseed, 1000 MG CAPS, Take 1 capsule by mouth daily., Disp: , Rfl: ;  tadalafil (CIALIS) 20 MG tablet, Take 20 mg by mouth daily as needed., Disp: , Rfl: ;  warfarin (COUMADIN) 5 MG tablet, Take 1 tablet (5 mg total) by mouth as directed., Disp: 180 tablet, Rfl: 0    Review of Systems Review of Systems  Constitutional: Negative for activity change, appetite change and fatigue.   HENT: Negative for hearing loss, congestion, tinnitus and ear discharge.  dentist q65m Eyes: Negative for visual disturbance (see optho q2y -- vision corrected to 20/20 with glasses).  Respiratory: Negative for cough, chest tightness and shortness of breath.   Cardiovascular: Negative for chest pain, palpitations and leg swelling.  Gastrointestinal: Negative for abdominal pain, diarrhea, constipation and abdominal distention.  Genitourinary: Negative for urgency, frequency, decreased urine volume and difficulty urinating.  Musculoskeletal: Negative for back pain, arthralgias and gait problem.  Skin: Negative for color change, pallor and rash.  Neurological: Negative for dizziness, light-headedness, numbness and headaches.  Hematological: Negative for adenopathy. Does not bruise/bleed easily.  Psychiatric/Behavioral: Negative for suicidal ideas, confusion, sleep disturbance, self-injury, dysphoric mood, decreased concentration and agitation.          Objective:   Physical Exam  BP 118/76  Pulse 88  Temp(Src) 98.3 F (36.8 C) (Oral)  Ht 6\' 1"  (1.854 m)  Wt 332 lb 9.6 oz (150.866 kg)  BMI 43.89 kg/m2  SpO2 99% General appearance: alert, cooperative, appears stated age and no distress Head: Normocephalic, without obvious abnormality, atraumatic Eyes: conjunctivae/corneas clear. PERRL, EOM's intact. Fundi benign. Ears: normal TM's and external ear canals both ears Nose: Nares normal. Septum midline. Mucosa normal. No drainage or sinus tenderness. Throat: lips, mucosa, and tongue  normal; teeth and gums normal Neck: no adenopathy, no carotid bruit, no JVD, supple, symmetrical, trachea midline and thyroid not enlarged, symmetric, no tenderness/mass/nodules Back: symmetric, no curvature. ROM normal. No CVA tenderness. Lungs: clear to auscultation bilaterally Chest wall: no tenderness Heart: regular rate and rhythm, S1, S2 normal, no murmur, click, rub or gallop Abdomen: soft,  non-tender; bowel sounds normal; no masses,  no organomegaly Male genitalia: penis: no lesions or discharge. testes: no masses or tenderness. no hernias Rectal: normal tone, normal prostate, no masses or tenderness and soft brown guaiac negative stool noted Extremities: extremities normal, atraumatic, no cyanosis or edema Pulses: 2+ and symmetric Skin: Skin color, texture, turgor normal. No rashes or lesions Lymph nodes: Cervical, supraclavicular, and axillary nodes normal. Neurologic: Alert and oriented X 3, normal strength and tone. Normal symmetric reflexes. Normal coordination and gait Psych-- no anxiety, no depression       Assessment & Plan:

## 2012-11-20 NOTE — Addendum Note (Signed)
Addended by: Lelon Perla on: 11/20/2012 09:38 AM   Modules accepted: Orders

## 2012-11-20 NOTE — Assessment & Plan Note (Signed)
Per urology On cialis

## 2012-11-20 NOTE — Assessment & Plan Note (Signed)
Check labs con't meds 

## 2012-11-20 NOTE — Patient Instructions (Addendum)
Preventive Care for Adults, Male A healthy lifestyle and preventive care can promote health and wellness. Preventive health guidelines for men include the following key practices:  A routine yearly physical is a good way to check with your caregiver about your health and preventative screening. It is a chance to share any concerns and updates on your health, and to receive a thorough exam.  Visit your dentist for a routine exam and preventative care every 6 months. Brush your teeth twice a day and floss once a day. Good oral hygiene prevents tooth decay and gum disease.  The frequency of eye exams is based on your age, health, family medical history, use of contact lenses, and other factors. Follow your caregiver's recommendations for frequency of eye exams.  Eat a healthy diet. Foods like vegetables, fruits, whole grains, low-fat dairy products, and lean protein foods contain the nutrients you need without too many calories. Decrease your intake of foods high in solid fats, added sugars, and salt. Eat the right amount of calories for you.Get information about a proper diet from your caregiver, if necessary.  Regular physical exercise is one of the most important things you can do for your health. Most adults should get at least 150 minutes of moderate-intensity exercise (any activity that increases your heart rate and causes you to sweat) each week. In addition, most adults need muscle-strengthening exercises on 2 or more days a week.  Maintain a healthy weight. The body mass index (BMI) is a screening tool to identify possible weight problems. It provides an estimate of body fat based on height and weight. Your caregiver can help determine your BMI, and can help you achieve or maintain a healthy weight.For adults 20 years and older:  A BMI below 18.5 is considered underweight.  A BMI of 18.5 to 24.9 is normal.  A BMI of 25 to 29.9 is considered overweight.  A BMI of 30 and above is  considered obese.  Maintain normal blood lipids and cholesterol levels by exercising and minimizing your intake of saturated fat. Eat a balanced diet with plenty of fruit and vegetables. Blood tests for lipids and cholesterol should begin at age 20 and be repeated every 5 years. If your lipid or cholesterol levels are high, you are over 50, or you are a high risk for heart disease, you may need your cholesterol levels checked more frequently.Ongoing high lipid and cholesterol levels should be treated with medicines if diet and exercise are not effective.  If you smoke, find out from your caregiver how to quit. If you do not use tobacco, do not start.  If you choose to drink alcohol, do not exceed 2 drinks per day. One drink is considered to be 12 ounces (355 mL) of beer, 5 ounces (148 mL) of wine, or 1.5 ounces (44 mL) of liquor.  Avoid use of street drugs. Do not share needles with anyone. Ask for help if you need support or instructions about stopping the use of drugs.  High blood pressure causes heart disease and increases the risk of stroke. Your blood pressure should be checked at least every 1 to 2 years. Ongoing high blood pressure should be treated with medicines, if weight loss and exercise are not effective.  If you are 45 to 58 years old, ask your caregiver if you should take aspirin to prevent heart disease.  Diabetes screening involves taking a blood sample to check your fasting blood sugar level. This should be done once every 3 years,   after age 45, if you are within normal weight and without risk factors for diabetes. Testing should be considered at a younger age or be carried out more frequently if you are overweight and have at least 1 risk factor for diabetes.  Colorectal cancer can be detected and often prevented. Most routine colorectal cancer screening begins at the age of 50 and continues through age 75. However, your caregiver may recommend screening at an earlier age if you  have risk factors for colon cancer. On a yearly basis, your caregiver may provide home test kits to check for hidden blood in the stool. Use of a small camera at the end of a tube, to directly examine the colon (sigmoidoscopy or colonoscopy), can detect the earliest forms of colorectal cancer. Talk to your caregiver about this at age 50, when routine screening begins. Direct examination of the colon should be repeated every 5 to 10 years through age 75, unless early forms of pre-cancerous polyps or small growths are found.  Hepatitis C blood testing is recommended for all people born from 1945 through 1965 and any individual with known risks for hepatitis C.  Practice safe sex. Use condoms and avoid high-risk sexual practices to reduce the spread of sexually transmitted infections (STIs). STIs include gonorrhea, chlamydia, syphilis, trichomonas, herpes, HPV, and human immunodeficiency virus (HIV). Herpes, HIV, and HPV are viral illnesses that have no cure. They can result in disability, cancer, and death.  A one-time screening for abdominal aortic aneurysm (AAA) and surgical repair of large AAAs by sound wave imaging (ultrasonography) is recommended for ages 65 to 75 years who are current or former smokers.  Healthy men should no longer receive prostate-specific antigen (PSA) blood tests as part of routine cancer screening. Consult with your caregiver about prostate cancer screening.  Testicular cancer screening is not recommended for adult males who have no symptoms. Screening includes self-exam, caregiver exam, and other screening tests. Consult with your caregiver about any symptoms you have or any concerns you have about testicular cancer.  Use sunscreen with skin protection factor (SPF) of 30 or more. Apply sunscreen liberally and repeatedly throughout the day. You should seek shade when your shadow is shorter than you. Protect yourself by wearing long sleeves, pants, a wide-brimmed hat, and  sunglasses year round, whenever you are outdoors.  Once a month, do a whole body skin exam, using a mirror to look at the skin on your back. Notify your caregiver of new moles, moles that have irregular borders, moles that are larger than a pencil eraser, or moles that have changed in shape or color.  Stay current with required immunizations.  Influenza. You need a dose every fall (or winter). The composition of the flu vaccine changes each year, so being vaccinated once is not enough.  Pneumococcal polysaccharide. You need 1 to 2 doses if you smoke cigarettes or if you have certain chronic medical conditions. You need 1 dose at age 65 (or older) if you have never been vaccinated.  Tetanus, diphtheria, pertussis (Tdap, Td). Get 1 dose of Tdap vaccine if you are younger than age 65 years, are over 65 and have contact with an infant, are a healthcare worker, or simply want to be protected from whooping cough. After that, you need a Td booster dose every 10 years. Consult your caregiver if you have not had at least 3 tetanus and diphtheria-containing shots sometime in your life or have a deep or dirty wound.  HPV. This vaccine is recommended   for males 13 through 58 years of age. This vaccine may be given to men 22 through 58 years of age who have not completed the 3 dose series. It is recommended for men through age 26 who have sex with men or whose immune system is weakened because of HIV infection, other illness, or medications. The vaccine is given in 3 doses over 6 months.  Measles, mumps, rubella (MMR). You need at least 1 dose of MMR if you were born in 1957 or later. You may also need a 2nd dose.  Meningococcal. If you are age 19 to 21 years and a first-year college student living in a residence hall, or have one of several medical conditions, you need to get vaccinated against meningococcal disease. You may also need additional booster doses.  Zoster (shingles). If you are age 60 years or  older, you should get this vaccine.  Varicella (chickenpox). If you have never had chickenpox or you were vaccinated but received only 1 dose, talk to your caregiver to find out if you need this vaccine.  Hepatitis A. You need this vaccine if you have a specific risk factor for hepatitis A virus infection, or you simply wish to be protected from this disease. The vaccine is usually given as 2 doses, 6 to 18 months apart.  Hepatitis B. You need this vaccine if you have a specific risk factor for hepatitis B virus infection or you simply wish to be protected from this disease. The vaccine is given in 3 doses, usually over 6 months. Preventative Service / Frequency Ages 19 to 39  Blood pressure check.** / Every 1 to 2 years.  Lipid and cholesterol check.** / Every 5 years beginning at age 20.  Hepatitis C blood test.** / For any individual with known risks for hepatitis C.  Skin self-exam. / Monthly.  Influenza immunization.** / Every year.  Pneumococcal polysaccharide immunization.** / 1 to 2 doses if you smoke cigarettes or if you have certain chronic medical conditions.  Tetanus, diphtheria, pertussis (Tdap,Td) immunization. / A one-time dose of Tdap vaccine. After that, you need a Td booster dose every 10 years.  HPV immunization. / 3 doses over 6 months, if 26 and younger.  Measles, mumps, rubella (MMR) immunization. / You need at least 1 dose of MMR if you were born in 1957 or later. You may also need a 2nd dose.  Meningococcal immunization. / 1 dose if you are age 19 to 21 years and a first-year college student living in a residence hall, or have one of several medical conditions, you need to get vaccinated against meningococcal disease. You may also need additional booster doses.  Varicella immunization.** / Consult your caregiver.  Hepatitis A immunization.** / Consult your caregiver. 2 doses, 6 to 18 months apart.  Hepatitis B immunization.** / Consult your caregiver. 3 doses  usually over 6 months. Ages 40 to 64  Blood pressure check.** / Every 1 to 2 years.  Lipid and cholesterol check.** / Every 5 years beginning at age 20.  Fecal occult blood test (FOBT) of stool. / Every year beginning at age 50 and continuing until age 75. You may not have to do this test if you get colonoscopy every 10 years.  Flexible sigmoidoscopy** or colonoscopy.** / Every 5 years for a flexible sigmoidoscopy or every 10 years for a colonoscopy beginning at age 50 and continuing until age 75.  Hepatitis C blood test.** / For all people born from 1945 through 1965 and any   individual with known risks for hepatitis C.  Skin self-exam. / Monthly.  Influenza immunization.** / Every year.  Pneumococcal polysaccharide immunization.** / 1 to 2 doses if you smoke cigarettes or if you have certain chronic medical conditions.  Tetanus, diphtheria, pertussis (Tdap/Td) immunization.** / A one-time dose of Tdap vaccine. After that, you need a Td booster dose every 10 years.  Measles, mumps, rubella (MMR) immunization. / You need at least 1 dose of MMR if you were born in 1957 or later. You may also need a 2nd dose.  Varicella immunization.**/ Consult your caregiver.  Meningococcal immunization.** / Consult your caregiver.  Hepatitis A immunization.** / Consult your caregiver. 2 doses, 6 to 18 months apart.  Hepatitis B immunization.** / Consult your caregiver. 3 doses, usually over 6 months. Ages 65 and over  Blood pressure check.** / Every 1 to 2 years.  Lipid and cholesterol check.**/ Every 5 years beginning at age 20.  Fecal occult blood test (FOBT) of stool. / Every year beginning at age 50 and continuing until age 75. You may not have to do this test if you get colonoscopy every 10 years.  Flexible sigmoidoscopy** or colonoscopy.** / Every 5 years for a flexible sigmoidoscopy or every 10 years for a colonoscopy beginning at age 50 and continuing until age 75.  Hepatitis C blood  test.** / For all people born from 1945 through 1965 and any individual with known risks for hepatitis C.  Abdominal aortic aneurysm (AAA) screening.** / A one-time screening for ages 65 to 75 years who are current or former smokers.  Skin self-exam. / Monthly.  Influenza immunization.** / Every year.  Pneumococcal polysaccharide immunization.** / 1 dose at age 65 (or older) if you have never been vaccinated.  Tetanus, diphtheria, pertussis (Tdap, Td) immunization. / A one-time dose of Tdap vaccine if you are over 65 and have contact with an infant, are a healthcare worker, or simply want to be protected from whooping cough. After that, you need a Td booster dose every 10 years.  Varicella immunization. ** / Consult your caregiver.  Meningococcal immunization.** / Consult your caregiver.  Hepatitis A immunization. ** / Consult your caregiver. 2 doses, 6 to 18 months apart.  Hepatitis B immunization.** / Check with your caregiver. 3 doses, usually over 6 months. **Family history and personal history of risk and conditions may change your caregiver's recommendations. Document Released: 08/29/2001 Document Revised: 09/25/2011 Document Reviewed: 11/28/2010 [employer] Patient Information 2013 [employer].  

## 2012-11-20 NOTE — Assessment & Plan Note (Signed)
Stable Check labs 

## 2012-11-20 NOTE — Assessment & Plan Note (Signed)
Stable Cont meds 

## 2012-11-20 NOTE — Assessment & Plan Note (Signed)
Check PT INr stat

## 2012-12-23 ENCOUNTER — Other Ambulatory Visit: Payer: Self-pay | Admitting: Family Medicine

## 2013-01-08 ENCOUNTER — Ambulatory Visit (INDEPENDENT_AMBULATORY_CARE_PROVIDER_SITE_OTHER): Payer: 59 | Admitting: Internal Medicine

## 2013-01-08 ENCOUNTER — Encounter: Payer: Self-pay | Admitting: Internal Medicine

## 2013-01-08 VITALS — BP 128/82 | HR 98 | Temp 99.0°F | Ht 73.0 in | Wt 366.0 lb

## 2013-01-08 DIAGNOSIS — J069 Acute upper respiratory infection, unspecified: Secondary | ICD-10-CM

## 2013-01-08 MED ORDER — AZITHROMYCIN 250 MG PO TABS: ORAL_TABLET | ORAL | Status: DC

## 2013-01-08 NOTE — Progress Notes (Signed)
HPI  Pt presents to the clinic today with c/o cold symptoms x 3 days. The worst part is the sore throat and dry cough. He does not produce any sputum. He is running low grade fevers. He has tried Robitussin, Mucinex, cough drops and nothing seems to help. He does not have a history of allergies or asthma. He does have sick contacts.  Review of Systems      Past Medical History  Diagnosis Date  . Hypertension   . DVT (deep venous thrombosis)   . Psychosexual dysfunction with inhibited sexual excitement   . Gout   . Influenza with pneumonia     Family History  Problem Relation Age of Onset  . Hypertension Mother   . Alzheimer's disease Mother   . Hypertension Father   . Alcohol abuse Father   . Cancer Sister     "male organs"  . Lung cancer Maternal Uncle   . Esophageal cancer Maternal Uncle   . Alzheimer's disease Paternal Aunt   . Hypertension Maternal Grandmother   . Alzheimer's disease Maternal Grandmother   . Hypertension Maternal Grandfather   . Hypertension Paternal Grandmother   . Hypertension Paternal Grandfather   . Heart disease Brother 62    MI    History   Social History  . Marital Status: Married    Spouse Name: Kriste Basque    Number of Children: 1  . Years of Education: N/A   Occupational History  . attorney at law--  w/s    Social History Main Topics  . Smoking status: Never Smoker   . Smokeless tobacco: Never Used  . Alcohol Use: Yes     Comment: ocass- once or twice a month  . Drug Use: No  . Sexually Active: Yes -- Male partner(s)   Other Topics Concern  . Not on file   Social History Narrative   Exercise --no    Allergies  Allergen Reactions  . Penicillins     REACTION: Penicillin     Constitutional: Positive headache, fatigue and fever. Denies abrupt weight changes.  HEENT:  Positive sore throat. Denies eye redness, eye pain, pressure behind the eyes, facial pain, nasal congestion, ear pain, ringing in the ears, wax buildup,  runny nose or bloody nose. Respiratory: Positive cough. Denies difficulty breathing or shortness of breath.  Cardiovascular: Denies chest pain, chest tightness, palpitations or swelling in the hands or feet.   No other specific complaints in a complete review of systems (except as listed in HPI above).  Objective:   BP 128/82  Pulse 98  Temp(Src) 99 F (37.2 C) (Oral)  Ht 6\' 1"  (1.854 m)  Wt 366 lb (166.017 kg)  BMI 48.3 kg/m2  SpO2 95% Wt Readings from Last 3 Encounters:  01/08/13 366 lb (166.017 kg)  11/20/12 332 lb 9.6 oz (150.866 kg)  10/22/12 331 lb (150.141 kg)     General: Appears his stated age, well developed, well nourished in NAD. HEENT: Head: normal shape and size; Eyes: sclera white, no icterus, conjunctiva pink, PERRLA and EOMs intact; Ears: Tm's gray and intact, normal light reflex; Nose: mucosa pink and moist, septum midline; Throat/Mouth: + PND. Teeth present, mucosa erythematous and moist, no exudate noted, no lesions or ulcerations noted.  Neck: Mild cervical lymphadenopathy. Neck supple, trachea midline. No massses, lumps or thyromegaly present.  Cardiovascular: Normal rate and rhythm. S1,S2 noted.  No murmur, rubs or gallops noted. No JVD or BLE edema. No carotid bruits noted. Pulmonary/Chest: Normal effort and positive  vesicular breath sounds. No respiratory distress. No wheezes, rales or ronchi noted.      Assessment & Plan:   Upper Respiratory Infection, new onset with additional workup required:  Get some rest and drink plenty of water Do salt water gargles for the sore throat eRx for Azithromax x 5 days OTC tylenol for pain/fever  RTC as needed or if symptoms persist.

## 2013-01-08 NOTE — Patient Instructions (Signed)

## 2013-02-27 ENCOUNTER — Ambulatory Visit (INDEPENDENT_AMBULATORY_CARE_PROVIDER_SITE_OTHER): Payer: 59

## 2013-02-27 VITALS — BP 128/70 | HR 71

## 2013-02-27 DIAGNOSIS — I82409 Acute embolism and thrombosis of unspecified deep veins of unspecified lower extremity: Secondary | ICD-10-CM

## 2013-02-27 NOTE — Patient Instructions (Addendum)
Continue with 5mg  everday. Recheck in one month. Keep up the good work. We will call you if any changes.

## 2013-04-01 ENCOUNTER — Ambulatory Visit (INDEPENDENT_AMBULATORY_CARE_PROVIDER_SITE_OTHER): Payer: 59 | Admitting: *Deleted

## 2013-04-01 VITALS — BP 120/80 | HR 68 | Temp 98.7°F | Wt 336.0 lb

## 2013-04-01 DIAGNOSIS — Z86718 Personal history of other venous thrombosis and embolism: Secondary | ICD-10-CM

## 2013-04-01 DIAGNOSIS — Z7901 Long term (current) use of anticoagulants: Secondary | ICD-10-CM

## 2013-04-01 NOTE — Patient Instructions (Addendum)
Continue current dose and recheck INR in 4 weeks.

## 2013-04-21 ENCOUNTER — Other Ambulatory Visit: Payer: Self-pay | Admitting: Family Medicine

## 2013-04-29 ENCOUNTER — Ambulatory Visit (INDEPENDENT_AMBULATORY_CARE_PROVIDER_SITE_OTHER): Payer: 59 | Admitting: *Deleted

## 2013-04-29 VITALS — BP 122/80 | HR 89 | Temp 98.3°F | Wt 334.8 lb

## 2013-04-29 DIAGNOSIS — Z7901 Long term (current) use of anticoagulants: Secondary | ICD-10-CM

## 2013-04-29 DIAGNOSIS — I82409 Acute embolism and thrombosis of unspecified deep veins of unspecified lower extremity: Secondary | ICD-10-CM

## 2013-04-29 LAB — POCT INR: INR: 2.6

## 2013-04-29 NOTE — Patient Instructions (Signed)
1 tab (5mg ) daily

## 2013-05-20 ENCOUNTER — Other Ambulatory Visit: Payer: Self-pay | Admitting: Family Medicine

## 2013-05-25 IMAGING — CR DG CHEST 2V
2 series · 2 of 2 positions shown · non-contrast
Comparison: 10/21/2011.  09/22/2010.

CLINICAL DATA: Cough.  Bronchitis.

CHEST - 2 VIEW

[w chest pa]
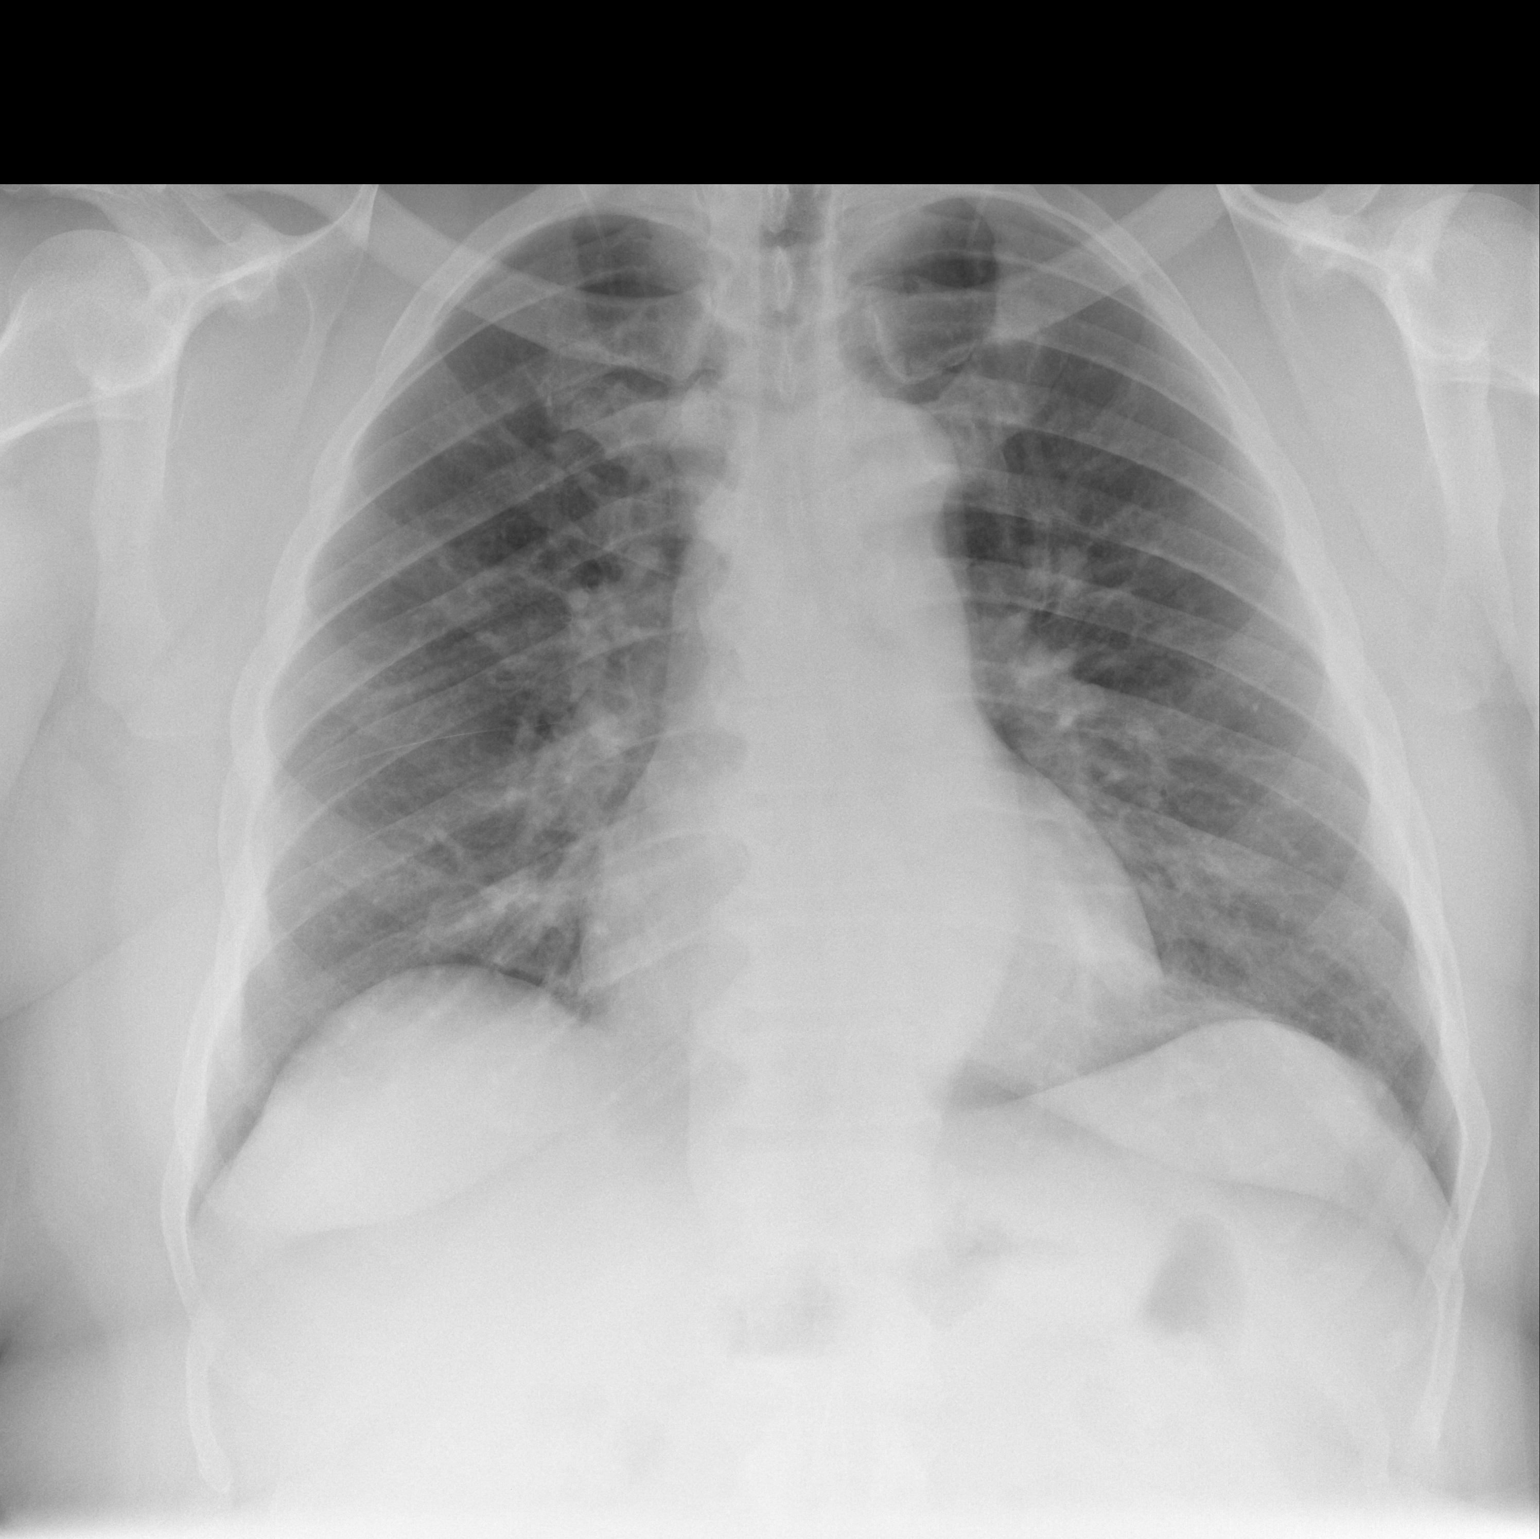

[w chest lat]
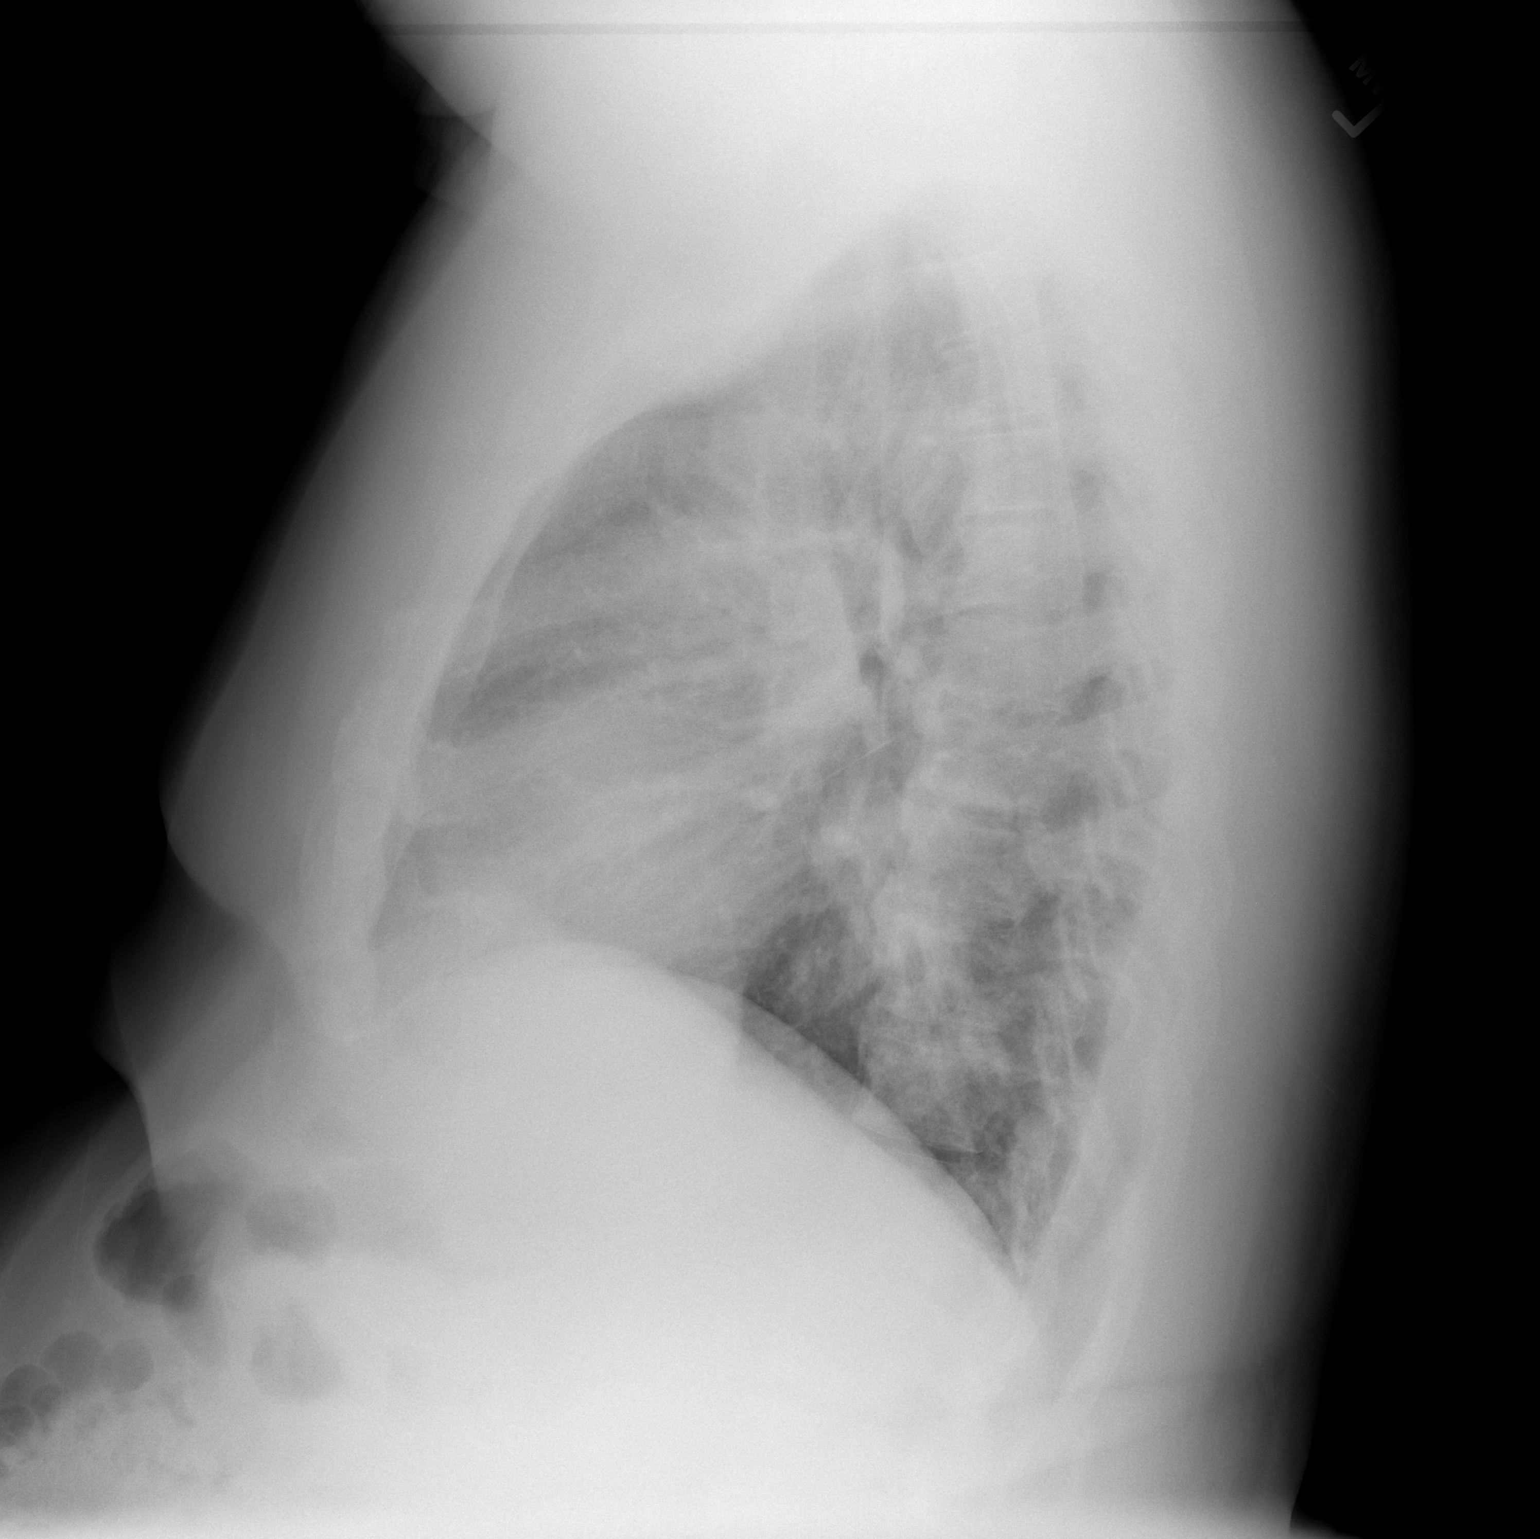

[2 of 2 positions shown; findings below may reference images not displayed]

FINDINGS: Cardiopericardial silhouette within normal limits.
Mediastinal contours normal. Trachea midline.  No airspace disease
or effusion.
IMPRESSION: No active cardiopulmonary disease.  No interval change.

## 2013-05-27 ENCOUNTER — Ambulatory Visit (INDEPENDENT_AMBULATORY_CARE_PROVIDER_SITE_OTHER): Payer: 59 | Admitting: *Deleted

## 2013-05-27 VITALS — BP 130/80 | HR 80 | Temp 98.2°F | Wt 334.0 lb

## 2013-05-27 DIAGNOSIS — Z86718 Personal history of other venous thrombosis and embolism: Secondary | ICD-10-CM

## 2013-05-27 DIAGNOSIS — Z7901 Long term (current) use of anticoagulants: Secondary | ICD-10-CM

## 2013-05-27 LAB — POCT INR: INR: 3.7

## 2013-05-27 NOTE — Patient Instructions (Signed)
Hold today dose. 2.5mg  Wednesday, then 5mg  all other days. Recheck in 1 week

## 2013-06-03 ENCOUNTER — Other Ambulatory Visit: Payer: Self-pay | Admitting: *Deleted

## 2013-06-03 ENCOUNTER — Ambulatory Visit (INDEPENDENT_AMBULATORY_CARE_PROVIDER_SITE_OTHER): Payer: 59 | Admitting: *Deleted

## 2013-06-03 VITALS — BP 128/82 | HR 67 | Temp 98.5°F | Wt 335.0 lb

## 2013-06-03 DIAGNOSIS — Z23 Encounter for immunization: Secondary | ICD-10-CM

## 2013-06-03 DIAGNOSIS — Z86718 Personal history of other venous thrombosis and embolism: Secondary | ICD-10-CM

## 2013-06-03 DIAGNOSIS — Z7901 Long term (current) use of anticoagulants: Secondary | ICD-10-CM

## 2013-06-03 LAB — POCT INR: INR: 2.8

## 2013-06-03 NOTE — Patient Instructions (Signed)
2.5mg Wednesday, then 5mg all other days. Recheck in 4 week 

## 2013-06-03 NOTE — Addendum Note (Signed)
Addended by: Dion Body on: 06/03/2013 09:54 AM   Modules accepted: Orders

## 2013-06-16 ENCOUNTER — Other Ambulatory Visit: Payer: Self-pay | Admitting: Family Medicine

## 2013-06-21 ENCOUNTER — Other Ambulatory Visit: Payer: Self-pay | Admitting: Family Medicine

## 2013-07-01 ENCOUNTER — Ambulatory Visit (INDEPENDENT_AMBULATORY_CARE_PROVIDER_SITE_OTHER): Payer: 59 | Admitting: *Deleted

## 2013-07-01 VITALS — BP 132/82 | HR 77 | Temp 98.2°F | Wt 341.6 lb

## 2013-07-01 DIAGNOSIS — Z86718 Personal history of other venous thrombosis and embolism: Secondary | ICD-10-CM

## 2013-07-01 DIAGNOSIS — Z7901 Long term (current) use of anticoagulants: Secondary | ICD-10-CM

## 2013-07-01 LAB — POCT INR: INR: 2.5

## 2013-07-01 NOTE — Patient Instructions (Signed)
2.5mg  Wednesday, then 5mg  all other days. Recheck in 4 week

## 2013-07-21 ENCOUNTER — Telehealth: Payer: Self-pay | Admitting: *Deleted

## 2013-07-21 NOTE — Telephone Encounter (Signed)
Patient stated he was seen by another provider and prescribed abx, pt check changed to this week rahter than next week.

## 2013-07-23 ENCOUNTER — Other Ambulatory Visit: Payer: Self-pay | Admitting: Family Medicine

## 2013-07-24 ENCOUNTER — Ambulatory Visit (INDEPENDENT_AMBULATORY_CARE_PROVIDER_SITE_OTHER): Payer: 59

## 2013-07-24 VITALS — BP 159/70 | HR 85 | Temp 98.4°F | Wt 332.6 lb

## 2013-07-24 DIAGNOSIS — Z7901 Long term (current) use of anticoagulants: Secondary | ICD-10-CM

## 2013-07-24 LAB — POCT INR: INR: 2.2

## 2013-07-24 NOTE — Patient Instructions (Addendum)
Continue same dosing. 1 tablet (5mg ) everyday except Wed (1/2 tablet) 2.5mg   Recheck in one month

## 2013-07-29 ENCOUNTER — Ambulatory Visit: Payer: 59

## 2013-08-01 ENCOUNTER — Ambulatory Visit (INDEPENDENT_AMBULATORY_CARE_PROVIDER_SITE_OTHER): Payer: 59 | Admitting: Family Medicine

## 2013-08-01 VITALS — BP 150/80 | HR 82 | Temp 98.8°F | Resp 20 | Ht 71.75 in | Wt 330.0 lb

## 2013-08-01 DIAGNOSIS — I10 Essential (primary) hypertension: Secondary | ICD-10-CM

## 2013-08-01 DIAGNOSIS — I82409 Acute embolism and thrombosis of unspecified deep veins of unspecified lower extremity: Secondary | ICD-10-CM

## 2013-08-01 DIAGNOSIS — M25519 Pain in unspecified shoulder: Secondary | ICD-10-CM

## 2013-08-01 DIAGNOSIS — M25512 Pain in left shoulder: Secondary | ICD-10-CM

## 2013-08-01 LAB — POCT CBC
Granulocyte percent: 66.2 %G (ref 37–80)
HCT, POC: 48.1 % (ref 43.5–53.7)
HEMOGLOBIN: 14.7 g/dL (ref 14.1–18.1)
Lymph, poc: 2.4 (ref 0.6–3.4)
MCH: 26.8 pg — AB (ref 27–31.2)
MCHC: 30.6 g/dL — AB (ref 31.8–35.4)
MCV: 87.8 fL (ref 80–97)
MID (cbc): 0.6 (ref 0–0.9)
MPV: 10.7 fL (ref 0–99.8)
POC GRANULOCYTE: 5.8 (ref 2–6.9)
POC LYMPH PERCENT: 27.3 %L (ref 10–50)
POC MID %: 6.5 %M (ref 0–12)
Platelet Count, POC: 261 10*3/uL (ref 142–424)
RBC: 5.48 M/uL (ref 4.69–6.13)
RDW, POC: 15.8 %
WBC: 8.8 10*3/uL (ref 4.6–10.2)

## 2013-08-01 LAB — COMPREHENSIVE METABOLIC PANEL
ALBUMIN: 3.9 g/dL (ref 3.5–5.2)
ALT: 15 U/L (ref 0–53)
AST: 13 U/L (ref 0–37)
Alkaline Phosphatase: 48 U/L (ref 39–117)
BUN: 13 mg/dL (ref 6–23)
CALCIUM: 9.2 mg/dL (ref 8.4–10.5)
CHLORIDE: 104 meq/L (ref 96–112)
CO2: 28 meq/L (ref 19–32)
Creat: 0.85 mg/dL (ref 0.50–1.35)
Glucose, Bld: 97 mg/dL (ref 70–99)
POTASSIUM: 4 meq/L (ref 3.5–5.3)
SODIUM: 136 meq/L (ref 135–145)
TOTAL PROTEIN: 6.9 g/dL (ref 6.0–8.3)
Total Bilirubin: 0.6 mg/dL (ref 0.3–1.2)

## 2013-08-01 LAB — CK TOTAL AND CKMB (NOT AT ARMC)
CK, MB: 1.7 ng/mL (ref 0.0–5.0)
Relative Index: 1 (ref 0.0–4.0)
Total CK: 163 U/L (ref 7–232)

## 2013-08-01 LAB — TROPONIN I: Troponin I: <0.01 ng/mL (ref <0.06)

## 2013-08-01 MED ORDER — ACETAMINOPHEN 325 MG PO TABS: 1000.0000 mg | ORAL_TABLET | Freq: Once | ORAL | Status: DC

## 2013-08-01 MED ORDER — CYCLOBENZAPRINE HCL 10 MG PO TABS: 10.0000 mg | ORAL_TABLET | Freq: Three times a day (TID) | ORAL | Status: DC

## 2013-08-01 NOTE — Progress Notes (Signed)
[facility] [address] [place] [address] [phone]  Date:  08/01/2013   Name:  Daniel Powers   DOB:  [birth year]   MRN:  [id number]  PCP:  Garnet Koyanagi, DO    Chief Complaint: Shoulder Pain   History of Present Illness:  Daniel Powers is a 58 y.o. very pleasant male patient who presents with the following:  Yesterday early am he noted pain in his left shoulder when he woke up.  He tried an OTC heat patch.  The shoulder bothered him all night last night, and he did not sleep well at all.  This am he continued to have pain and decided to come in. He notes stiffness in his neck as well.  The pain feels "like a deep tooth ache."  It continues to bother him now.   He had a similar problem with his shoulder after doing yardwork about 15 years ago.   He cannot think of anything he might have done that could have triggered this episode- except he did have to reach overhead to change some lightbulbs recently.  He has not been exercising or doing any other physical work.  The pain is worse when he lays down at night.  It is NOT worse with walking.    He was recently treated for some sort of acute bronchitis with cough and fever.  He was treated with doxycycline.  He finished the doxy a few days ago and those sx are now resolved.   He does not have any history of heart problems.  However his older brother had an MI at age 51.  No other family history or cardiac problems as far as he knows.   He has had a stress test but it was 10- 15 years ago.  No current cardiologist He does not get much exercise at baseline.   Never a smoker  He takes coumadin because he had a DVT in 2012. 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   INR on 1/8 therapeutic at 2.2 Patient Active Problem List   Diagnosis Date Noted  . Other and unspecified hyperlipidemia 10/22/2012  . Personal history of colonic  polyps 01/02/2012  . Musculoskeletal pain 01/02/2012  . Anxiety 11/21/2011  . OSA (obstructive sleep apnea) 11/21/2011  . Special screening for malignant neoplasms, colon 09/20/2011  . DVT, HX OF 09/12/2010  . DVT 03/03/2010  . GOUT 09/06/2009  . COSTOCHONDRITIS 09/06/2009  . FOREIGN BODY, HAND 01/16/2008  . LEG PAIN, RIGHT 01/14/2008  . PNEUMONIA DUE TO OTHER SPECIFIED BACTERIA 10/09/2007  . INFLUENZA WITH PNEUMONIA 10/09/2007  . ERECTILE DYSFUNCTION 09/02/2006  . HYPERTENSION 09/02/2006  . ALLERGIC RHINITIS 09/02/2006    Past Medical History  Diagnosis Date  . Hypertension   . DVT (deep venous thrombosis)   . Psychosexual dysfunction with inhibited sexual excitement   . Gout   . Influenza with pneumonia     Past Surgical History  Procedure Laterality Date  . Tonsillectomy and adenoidectomy    . Hand surgery      right    History  Substance Use Topics  . Smoking status: Never Smoker   . Smokeless tobacco: Never Used  . Alcohol Use: Yes     Comment: ocass- once or twice a month    Family History  Problem Relation Age of Onset  . Hypertension Mother   .  Alzheimer's disease Mother   . Hypertension Father   . Alcohol abuse Father   . Cancer Sister     "male organs"  . Lung cancer Maternal Uncle   . Esophageal cancer Maternal Uncle   . Alzheimer's disease Paternal Aunt   . Hypertension Maternal Grandmother   . Alzheimer's disease Maternal Grandmother   . Hypertension Maternal Grandfather   . Hypertension Paternal Grandmother   . Hypertension Paternal Grandfather   . Heart disease Brother 45    MI    Allergies  Allergen Reactions  . Penicillins     REACTION: Penicillin    Medication list has been reviewed and updated.  Current Outpatient Prescriptions on File Prior to Visit  Medication Sig Dispense Refill  . albuterol (PROAIR HFA) 108 (90 BASE) MCG/ACT inhaler Inhale 2 puffs into the lungs every 6 (six) hours as needed for wheezing.  1 Inhaler  2   . amLODipine (NORVASC) 5 MG tablet 1 tab by mouth daily--office visit due now  30 tablet  0  . cetirizine (ZYRTEC) 10 MG tablet Take 10 mg by mouth daily.      [name] DIOVAN 160 MG tablet TAKE 1 TABLET BY MOUTH TWICE DAILY. MUST HAVE OFFICE VISIT FOR REFILLS  60 tablet  0  . fish oil-omega-3 fatty acids 1000 MG capsule Take 1 g by mouth daily.      . Flaxseed, Linseed, 1000 MG CAPS Take 1 capsule by mouth daily.      . tadalafil (CIALIS) 20 MG tablet Take 20 mg by mouth daily as needed.      . warfarin (COUMADIN) 5 MG tablet TAKE 1 TABLET BY MOUTH DAILY AS DIRECTED  180 tablet  0  . ALPRAZolam (XANAX) 0.5 MG tablet Take 0.5 mg by mouth at bedtime as needed.       No current facility-administered medications on file prior to visit.    Review of Systems:  As per HPI- otherwise negative. Denies any history of DM   Physical Examination: Filed Vitals:   08/01/13 1022  BP: 150/80  Pulse: 82  Temp: 98.8 F (37.1 C)  Resp: 20   Filed Vitals:   08/01/13 1022  Height: 5' 11.75" (1.822 m)  Weight: 330 lb (149.687 kg)   Body mass index is 45.09 kg/(m^2). Ideal Body Weight: Weight in (lb) to have BMI = 25: 182.7  GEN: WDWN, NAD, Non-toxic, A & O x 3, morbid obesity HEENT: Atraumatic, Normocephalic. Neck supple. No masses, No LAD. Ears and Nose: No external deformity. CV: RRR, No M/G/R. No JVD. No thrill. No extra heart sounds. PULM: CTA B, no wheezes, crackles, rhonchi. No retractions. No resp. distress. No accessory muscle use. EXTR: No c/c/e NEURO Normal gait.  PSYCH: Normally interactive. Conversant. Not depressed or anxious appearing.  Calm demeanor.  Left shoulder; he has mild tenderness over the left trapezius and over the left RCT insertion.  He has pain with flexion and abduction of the shoulder but not with internal or external rotation.  Also pain with empty can test- no weakness however Normal strength, sensation and DTR both UE  EKG: SR with some down- going T waves in the  chest leads.  Subtle change from past EKG  Results for orders placed in visit on 08/01/13  TROPONIN I      Result Value Range   Troponin I <0.01  <0.06 ng/mL  COMPREHENSIVE METABOLIC PANEL      Result Value Range   Sodium 136  135 - 145  mEq/L   Potassium 4.0  3.5 - 5.3 mEq/L   Chloride 104  96 - 112 mEq/L   CO2 28  19 - 32 mEq/L   Glucose, Bld 97  70 - 99 mg/dL   BUN 13  6 - 23 mg/dL   Creat 0.85  0.50 - 1.35 mg/dL   Total Bilirubin 0.6  0.3 - 1.2 mg/dL   Alkaline Phosphatase 48  39 - 117 U/L   AST 13  0 - 37 U/L   ALT 15  0 - 53 U/L   Total Protein 6.9  6.0 - 8.3 g/dL   Albumin 3.9  3.5 - 5.2 g/dL   Calcium 9.2  8.4 - 10.5 mg/dL  CK TOTAL AND CKMB      Result Value Range   Total CK 163  7 - 232 U/L   CK, MB 1.7  0.0 - 5.0 ng/mL   Relative Index 1.0  0.0 - 4.0  POCT CBC      Result Value Range   WBC 8.8  4.6 - 10.2 K/uL   Lymph, poc 2.4  0.6 - 3.4   POC LYMPH PERCENT 27.3  10 - 50 %L   MID (cbc) 0.6  0 - 0.9   POC MID % 6.5  0 - 12 %M   POC Granulocyte 5.8  2 - 6.9   Granulocyte percent 66.2  37 - 80 %G   RBC 5.48  4.69 - 6.13 M/uL   Hemoglobin 14.7  14.1 - 18.1 g/dL   HCT, POC 48.1  43.5 - 53.7 %   MCV 87.8  80 - 97 fL   MCH, POC 26.8 (*) 27 - 31.2 pg   MCHC 30.6 (*) 31.8 - 35.4 g/dL   RDW, POC 15.8     Platelet Count, POC 261  142 - 424 K/uL   MPV 10.7  0 - 99.8 fL    Assessment and Plan: Left shoulder pain - Plan: EKG 12-Lead, acetaminophen (TYLENOL) tablet 975 mg, Troponin I, cyclobenzaprine (FLEXERIL) 10 MG tablet, CANCELED: Troponin T, CANCELED: CKMB  Morbid obesity  HTN (hypertension) - Plan: POCT CBC, Comprehensive metabolic panel  Daniel Powers is here today with left shoulder pain which seems likely MSK in origin.  However he does have cardiac risk factors including family history, HTN and obesity.  After discussion with pt decided to run a set of cardiac enzymes.  As these are negative he is able to go home with flexeril to use prn.  Will have him see  cardiology in the next week or two as he likely needs a stress test for risk stratification.    Signed Lamar Blinks, MD

## 2013-08-01 NOTE — Patient Instructions (Addendum)
Use the flexeril as needed for shoulder pain. Remember it can make you drowsy so be cautious regarding driving.  Do not take it in combination with xanax. You may also use some tylenol as needed for your shoulder pain. However, do not go over 2,000 mg a day (due to your coumadin use)   I will get you set up to see cardiology in the next week or so for evaluation.  You may need a stress test.

## 2013-08-04 ENCOUNTER — Telehealth: Payer: Self-pay | Admitting: Family Medicine

## 2013-08-04 ENCOUNTER — Encounter: Payer: Self-pay | Admitting: Family Medicine

## 2013-08-04 DIAGNOSIS — R9431 Abnormal electrocardiogram [ECG] [EKG]: Secondary | ICD-10-CM

## 2013-08-04 NOTE — Telephone Encounter (Signed)
Make appt for him with cardiology this Friday. Called and let him know. This appt is fine for him.  He states his shoulder is still sore but much better

## 2013-08-08 ENCOUNTER — Encounter: Payer: Self-pay | Admitting: Interventional Cardiology

## 2013-08-08 ENCOUNTER — Ambulatory Visit (INDEPENDENT_AMBULATORY_CARE_PROVIDER_SITE_OTHER): Payer: 59 | Admitting: Interventional Cardiology

## 2013-08-08 VITALS — BP 153/96 | HR 90 | Ht 71.5 in | Wt 325.0 lb

## 2013-08-08 DIAGNOSIS — Z7901 Long term (current) use of anticoagulants: Secondary | ICD-10-CM

## 2013-08-08 DIAGNOSIS — G4733 Obstructive sleep apnea (adult) (pediatric): Secondary | ICD-10-CM

## 2013-08-08 DIAGNOSIS — I10 Essential (primary) hypertension: Secondary | ICD-10-CM

## 2013-08-08 DIAGNOSIS — M79609 Pain in unspecified limb: Secondary | ICD-10-CM

## 2013-08-08 DIAGNOSIS — M79602 Pain in left arm: Secondary | ICD-10-CM

## 2013-08-08 DIAGNOSIS — Z86718 Personal history of other venous thrombosis and embolism: Secondary | ICD-10-CM

## 2013-08-08 NOTE — Progress Notes (Signed)
Patient ID: Daniel Powers, male   DOB: [birth year], 58 y.o.   MRN: [id number]   Date: 08/08/2013 ID: Daniel Powers, DOB 04-04-1956, MRN [id number] PCP: Garnet Koyanagi, DO  Reason: Left shoulder discomfort  ASSESSMENT;  1. Left shoulder discomfort, felt were present musculoskeletal rather than ischemic symptoms. 2. Obesity with untreated obstructive sleep apnea 3. Hypertension, poorly controlled 4. Family history of premature coronary artery disease 5. History of DVT, recurrent 6. Chronic Coumadin therapy  PLAN:  1. Exercise treadmill test, to establish a baseline for future reference concerning CAD. We'll also help to establish whether or not blood pressure is under good control.  2. The blood pressure to be better controlled by adding HCTZ to the current regimen of Diovan. This could be given as a combo tablet. HCTZ 12.5 mg per day.  3. Weight reduction, dietary salt reduction, and  aerobic exercise discussed   SUBJECTIVE: Daniel Powers is a 58 y.o. male who is referred for cardiac consideration because of left shoulder discomfort. 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 The discomfort was characterized as being continuous although it is gradually getting better. It is not aggravated by walking or other physical activity. It is aggravated by turning his head to the left and certain other changes in positions. He denies orthopnea, PND, exertional dyspnea, chest pain, and history of heart disease. He has an older brother who recently had a stent placed due to acute myocardial infarction. He has been under a lot of stress due to the illness of his mother, his wife, and a daughter. He has had a prior exercise treadmill test approximately 15 years ago that was unremarkable. There is no history of pulmonary emboli but he does have a history of recurrent  DVT and is on chronic  Coumadin.   Allergies  Allergen Reactions  . Penicillins     REACTION: Penicillin    Current Outpatient Prescriptions on File Prior to Visit  Medication Sig Dispense Refill  . albuterol (PROAIR HFA) 108 (90 BASE) MCG/ACT inhaler Inhale 2 puffs into the lungs every 6 (six) hours as needed for wheezing.  1 Inhaler  2  . ALPRAZolam (XANAX) 0.5 MG tablet Take 0.5 mg by mouth at bedtime as needed.      [name] amLODipine (NORVASC) 5 MG tablet 1 tab by mouth daily--office visit due now  30 tablet  0  . cetirizine (ZYRTEC) 10 MG tablet Take 10 mg by mouth daily.      . cyclobenzaprine (FLEXERIL) 10 MG tablet Take 1 tablet (10 mg total) by mouth 3 (three) times daily.  30 tablet  0  . DIOVAN 160 MG tablet TAKE 1 TABLET BY MOUTH TWICE DAILY. MUST HAVE OFFICE VISIT FOR REFILLS  60 tablet  0  . fish oil-omega-3 fatty acids 1000 MG capsule Take 1 g by mouth daily.      . Flaxseed, Linseed, 1000 MG CAPS Take 1 capsule by mouth daily.      . tadalafil (CIALIS) 20 MG tablet Take 20 mg by mouth daily as needed.      . warfarin (COUMADIN) 5 MG tablet TAKE 1 TABLET BY MOUTH DAILY AS DIRECTED  180 tablet  0   Current Facility-Administered Medications on File Prior to Visit  Medication Dose Route Frequency Provider Last Rate Last Dose  . acetaminophen (TYLENOL) tablet 975 mg  975 mg Oral Once [employer]  Ulyess Blossom, MD        Past Medical History  Diagnosis Date  . Hypertension   . DVT (deep venous thrombosis)   . Psychosexual dysfunction with inhibited sexual excitement   . Gout   . Influenza with pneumonia     Past Surgical History  Procedure Laterality Date  . Tonsillectomy and adenoidectomy    . Hand surgery      right    History   Social History  . Marital Status: Married    Spouse Name: [name]    Number of Children: 1  . Years of Education: N/A   Occupational History  . attorney at law--  w/s    Social History Main Topics  . Smoking status: Never Smoker   . Smokeless tobacco: Never Used   . Alcohol Use: Yes     Comment: ocass- once or twice a month  . Drug Use: No  . Sexual Activity: Yes    Partners: Female   Other Topics Concern  . Not on file   Social History Narrative   Exercise --no    Family History  Problem Relation Age of Onset  . Hypertension Mother   . Alzheimer's disease Mother   . Hypertension Father   . Alcohol abuse Father   . Cancer Sister     "male organs"  . Lung cancer Maternal Uncle   . Esophageal cancer Maternal Uncle   . Alzheimer's disease Paternal Aunt   . Hypertension Maternal Grandmother   . Alzheimer's disease Maternal Grandmother   . Hypertension Maternal Grandfather   . Hypertension Paternal Grandmother   . Hypertension Paternal Grandfather   . Heart disease Brother 104    MI    ROS: Obese, excessive appetite, occasional panic, and sedentary lifestyle. Denies melena, hematemesis, hematochezia, transient neurological symptoms, wheezing, abdominal pain, and swollen joints. He has had a left rotator cuff injury.. Other systems negative for complaints.  OBJECTIVE: BP 153/96  Pulse 90  Ht 5' 11.5" (1.816 m)  Wt 325 lb (147.419 kg)  BMI 44.70 kg/m2,  General: No acute distress, obese. HEENT: normal without jaundice or pallor Neck: JVD flat. Carotids 2+ and symmetric Chest: Clear Cardiac: Murmur: Absent. Gallop: S4. Rhythm: Regular. Other: Normal Abdomen: Bruit: Absent. Pulsation: Absent. Obese abdomen Extremities: Edema: Trace ankle edema. Pulses: 2+ bilateral upper and lower Neuro: Normal Psych: Normal  ECG: Normal sinus rhythm with RSR prime V1. Nonspecific T-wave inversion lateral leads. Mild left axis deviation.

## 2013-08-08 NOTE — Patient Instructions (Signed)
Your physician recommends that you continue on your current medications as directed. Please refer to the Current Medication list given to you today.  Your physician has requested that you have an exercise tolerance test. For further information please visit [email or web address]. Please also follow instruction sheet, as given.   Your physician recommends that you schedule a follow-up appointment as needed  

## 2013-08-18 ENCOUNTER — Other Ambulatory Visit: Payer: Self-pay | Admitting: Family Medicine

## 2013-08-23 ENCOUNTER — Other Ambulatory Visit: Payer: Self-pay | Admitting: Family Medicine

## 2013-08-25 NOTE — Telephone Encounter (Signed)
OV needed for refills. Letter mailed.

## 2013-08-26 ENCOUNTER — Ambulatory Visit: Payer: 59 | Admitting: Family Medicine

## 2013-08-26 ENCOUNTER — Ambulatory Visit (INDEPENDENT_AMBULATORY_CARE_PROVIDER_SITE_OTHER): Payer: 59

## 2013-08-26 VITALS — BP 129/80 | HR 85 | Temp 98.3°F | Wt 326.6 lb

## 2013-08-26 DIAGNOSIS — Z7901 Long term (current) use of anticoagulants: Secondary | ICD-10-CM

## 2013-08-26 LAB — POCT INR: INR: 3

## 2013-08-26 NOTE — Patient Instructions (Addendum)
Take 5 mg (1tab) all days except Wed take 2.5 mg (1/2tab)   Recheck in 4 weeks--Tues March 10th at [facility]

## 2013-09-08 ENCOUNTER — Encounter: Payer: Self-pay | Admitting: *Deleted

## 2013-09-11 ENCOUNTER — Encounter: Payer: 59 | Admitting: Physician Assistant

## 2013-09-18 ENCOUNTER — Encounter (HOSPITAL_COMMUNITY): Payer: 59

## 2013-09-23 ENCOUNTER — Ambulatory Visit (INDEPENDENT_AMBULATORY_CARE_PROVIDER_SITE_OTHER): Payer: 59

## 2013-09-23 VITALS — BP 124/74 | HR 82 | Temp 98.5°F | Wt 325.0 lb

## 2013-09-23 DIAGNOSIS — Z7901 Long term (current) use of anticoagulants: Secondary | ICD-10-CM

## 2013-09-23 LAB — POCT INR: INR: 2

## 2013-09-23 NOTE — Progress Notes (Signed)
Pre visit review using our clinic review tool, if applicable. No additional management support is needed unless otherwise documented below in the visit note. 

## 2013-09-23 NOTE — Patient Instructions (Signed)
Take 5mg  (1 tab) all days except Wednesday take 2.5mg  (1/2 tab)  Recheck in 4 weeks--10/23/2013 Thursday at [place] for Coumadin Check Annual Physical--11/27/2013 Thursday at 930am

## 2013-09-24 ENCOUNTER — Other Ambulatory Visit: Payer: Self-pay | Admitting: Family Medicine

## 2013-10-03 ENCOUNTER — Telehealth (HOSPITAL_COMMUNITY): Payer: Self-pay

## 2013-10-07 ENCOUNTER — Encounter (HOSPITAL_COMMUNITY): Payer: 59

## 2013-10-23 ENCOUNTER — Ambulatory Visit (INDEPENDENT_AMBULATORY_CARE_PROVIDER_SITE_OTHER): Payer: 59

## 2013-10-23 VITALS — BP 149/81 | HR 83 | Temp 98.1°F | Wt 326.4 lb

## 2013-10-23 DIAGNOSIS — Z7901 Long term (current) use of anticoagulants: Secondary | ICD-10-CM

## 2013-10-23 DIAGNOSIS — I82409 Acute embolism and thrombosis of unspecified deep veins of unspecified lower extremity: Secondary | ICD-10-CM

## 2013-10-23 LAB — POCT INR: INR: 2.4

## 2013-10-23 NOTE — Patient Instructions (Signed)
Take 1 tab (5mg ) all days except Wed 1/2 tab (2.5mg )  Recheck at your physical in one month.

## 2013-10-23 NOTE — Progress Notes (Signed)
Pre visit review using our clinic review tool, if applicable. No additional management support is needed unless otherwise documented below in the visit note. 

## 2013-10-24 ENCOUNTER — Other Ambulatory Visit: Payer: Self-pay | Admitting: Family Medicine

## 2013-10-30 ENCOUNTER — Telehealth (HOSPITAL_COMMUNITY): Payer: Self-pay

## 2013-10-31 ENCOUNTER — Telehealth (HOSPITAL_COMMUNITY): Payer: Self-pay

## 2013-11-04 ENCOUNTER — Ambulatory Visit (HOSPITAL_COMMUNITY)
Admission: RE | Admit: 2013-11-04 | Discharge: 2013-11-04 | Disposition: A | Discharge status: Home / Self Care | Payer: 59 | Source: Ambulatory Visit | Attending: Physician Assistant | Admitting: Physician Assistant

## 2013-11-04 DIAGNOSIS — M79602 Pain in left arm: Secondary | ICD-10-CM

## 2013-11-04 DIAGNOSIS — M79609 Pain in unspecified limb: Secondary | ICD-10-CM

## 2013-11-21 ENCOUNTER — Other Ambulatory Visit: Payer: Self-pay | Admitting: Family Medicine

## 2013-11-22 ENCOUNTER — Other Ambulatory Visit: Payer: Self-pay | Admitting: Family Medicine

## 2013-11-27 ENCOUNTER — Encounter: Payer: 59 | Admitting: Family Medicine

## 2013-12-01 ENCOUNTER — Telehealth: Payer: Self-pay

## 2013-12-01 NOTE — Telephone Encounter (Signed)
Medication List and allergies:  Reviewed and updated  90 day supply/mail order: na Local prescriptions: [employer]  Immunizations due: UTD  A/P:   No changes to FH, PSH or Personal Hx Flu vaccine--05/2013 Tdap--2011 CCS--09/2011--Dr Kaplan--adenomatous polyps--next due 2018 PSA--11/2012--0.53  To Discuss with Provider: Not at this time

## 2013-12-02 ENCOUNTER — Encounter: Payer: Self-pay | Admitting: Family Medicine

## 2013-12-02 ENCOUNTER — Ambulatory Visit (INDEPENDENT_AMBULATORY_CARE_PROVIDER_SITE_OTHER): Payer: 59 | Admitting: Family Medicine

## 2013-12-02 VITALS — BP 126/74 | HR 81 | Temp 98.8°F | Ht 73.0 in | Wt 326.0 lb

## 2013-12-02 DIAGNOSIS — I10 Essential (primary) hypertension: Secondary | ICD-10-CM

## 2013-12-02 DIAGNOSIS — I82409 Acute embolism and thrombosis of unspecified deep veins of unspecified lower extremity: Secondary | ICD-10-CM

## 2013-12-02 DIAGNOSIS — Z0000 Encounter for general adult medical examination without abnormal findings: Secondary | ICD-10-CM

## 2013-12-02 DIAGNOSIS — M109 Gout, unspecified: Secondary | ICD-10-CM

## 2013-12-02 DIAGNOSIS — M25569 Pain in unspecified knee: Secondary | ICD-10-CM

## 2013-12-02 DIAGNOSIS — Z6841 Body Mass Index (BMI) 40.0 and over, adult: Secondary | ICD-10-CM | POA: Insufficient documentation

## 2013-12-02 DIAGNOSIS — M25561 Pain in right knee: Secondary | ICD-10-CM

## 2013-12-02 LAB — HEPATIC FUNCTION PANEL
ALT: 16 U/L (ref 0–53)
AST: 17 U/L (ref 0–37)
Albumin: 3.9 g/dL (ref 3.5–5.2)
Alkaline Phosphatase: 43 U/L (ref 39–117)
Bilirubin, Direct: 0.1 mg/dL (ref 0.0–0.3)
Total Bilirubin: 0.3 mg/dL (ref 0.2–1.2)
Total Protein: 6.8 g/dL (ref 6.0–8.3)

## 2013-12-02 LAB — PSA: PSA: 0.77 ng/mL (ref 0.10–4.00)

## 2013-12-02 LAB — BASIC METABOLIC PANEL
BUN: 16 mg/dL (ref 6–23)
CALCIUM: 8.9 mg/dL (ref 8.4–10.5)
CO2: 24 mEq/L (ref 19–32)
CREATININE: 1 mg/dL (ref 0.4–1.5)
Chloride: 108 mEq/L (ref 96–112)
GFR: 98.26 mL/min (ref >60.00)
Glucose, Bld: 99 mg/dL (ref 70–99)
Potassium: 3.8 mEq/L (ref 3.5–5.1)
Sodium: 139 mEq/L (ref 135–145)

## 2013-12-02 LAB — TSH: TSH: 1.46 u[IU]/mL (ref 0.35–4.50)

## 2013-12-02 LAB — CBC WITH DIFFERENTIAL/PLATELET
BASOS ABS: 0 10*3/uL (ref 0.0–0.1)
Basophils Relative: 0.3 % (ref 0.0–3.0)
Eosinophils Absolute: 0.1 10*3/uL (ref 0.0–0.7)
Eosinophils Relative: 1.8 % (ref 0.0–5.0)
HEMATOCRIT: 46.2 % (ref 39.0–52.0)
Hemoglobin: 15.3 g/dL (ref 13.0–17.0)
LYMPHS ABS: 2 10*3/uL (ref 0.7–4.0)
Lymphocytes Relative: 28.3 % (ref 12.0–46.0)
MCHC: 33 g/dL (ref 30.0–36.0)
MCV: 82.9 fl (ref 78.0–100.0)
Monocytes Absolute: 0.6 10*3/uL (ref 0.1–1.0)
Monocytes Relative: 8 % (ref 3.0–12.0)
Neutro Abs: 4.3 10*3/uL (ref 1.4–7.7)
Neutrophils Relative %: 61.6 % (ref 43.0–77.0)
PLATELETS: 238 10*3/uL (ref 150.0–400.0)
RBC: 5.57 Mil/uL (ref 4.22–5.81)
RDW: 15.3 % (ref 11.5–15.5)
WBC: 7 10*3/uL (ref 4.0–10.5)

## 2013-12-02 LAB — POCT URINALYSIS DIPSTICK
Bilirubin, UA: NEGATIVE
Blood, UA: NEGATIVE
Glucose, UA: NEGATIVE
Ketones, UA: NEGATIVE
Leukocytes, UA: NEGATIVE
Nitrite, UA: NEGATIVE
PH UA: 6
PROTEIN UA: NEGATIVE
SPEC GRAV UA: 1.025
UROBILINOGEN UA: 0.2

## 2013-12-02 LAB — LIPID PANEL
CHOLESTEROL: 181 mg/dL (ref 0–200)
HDL: 36.7 mg/dL — AB (ref >39.00)
LDL Cholesterol: 127 mg/dL — ABNORMAL HIGH (ref 0–99)
TRIGLYCERIDES: 86 mg/dL (ref 0.0–149.0)
Total CHOL/HDL Ratio: 5
VLDL: 17.2 mg/dL (ref 0.0–40.0)

## 2013-12-02 LAB — URIC ACID: Uric Acid, Serum: 5.1 mg/dL (ref 4.0–7.8)

## 2013-12-02 NOTE — Patient Instructions (Signed)
Preventive Care for Adults, Male A healthy lifestyle and preventive care can promote health and wellness. Preventive health guidelines for men include the following key practices:  A routine yearly physical is a good way to check with your health care provider about your health and preventative screening. It is a chance to share any concerns and updates on your health and to receive a thorough exam.  Visit your dentist for a routine exam and preventative care every 6 months. Brush your teeth twice a day and floss once a day. Good oral hygiene prevents tooth decay and gum disease.  The frequency of eye exams is based on your age, health, family medical history, use of contact lenses, and other factors. Follow your health care provider's recommendations for frequency of eye exams.  Eat a healthy diet. Foods such as vegetables, fruits, whole grains, low-fat dairy products, and lean protein foods contain the nutrients you need without too many calories. Decrease your intake of foods high in solid fats, added sugars, and salt. Eat the right amount of calories for you.Get information about a proper diet from your health care provider, if necessary.  Regular physical exercise is one of the most important things you can do for your health. Most adults should get at least 150 minutes of moderate-intensity exercise (any activity that increases your heart rate and causes you to sweat) each week. In addition, most adults need muscle-strengthening exercises on 2 or more days a week.  Maintain a healthy weight. The body mass index (BMI) is a screening tool to identify possible weight problems. It provides an estimate of body fat based on height and weight. Your health care provider can find your BMI and can help you achieve or maintain a healthy weight.For adults 20 years and older:  A BMI below 18.5 is considered underweight.  A BMI of 18.5 to 24.9 is normal.  A BMI of 25 to 29.9 is considered  overweight.  A BMI of 30 and above is considered obese.  Maintain normal blood lipids and cholesterol levels by exercising and minimizing your intake of saturated fat. Eat a balanced diet with plenty of fruit and vegetables. Blood tests for lipids and cholesterol should begin at age 42 and be repeated every 5 years. If your lipid or cholesterol levels are high, you are over 50, or you are at high risk for heart disease, you may need your cholesterol levels checked more frequently.Ongoing high lipid and cholesterol levels should be treated with medicines if diet and exercise are not working.  If you smoke, find out from your health care provider how to quit. If you do not use tobacco, do not start.  Lung cancer screening is recommended for adults aged 24 80 years who are at high risk for developing lung cancer because of a history of smoking. A yearly low-dose CT scan of the lungs is recommended for people who have at least a 30-pack-year history of smoking and are a current smoker or have quit within the past 15 years. A pack year of smoking is smoking an average of 1 pack of cigarettes a day for 1 year (for example: 1 pack a day for 30 years or 2 packs a day for 15 years). Yearly screening should continue until the smoker has stopped smoking for at least 15 years. Yearly screening should be stopped for people who develop a health problem that would prevent them from having lung cancer treatment.  If you choose to drink alcohol, do not have  more than 2 drinks per day. One drink is considered to be 12 ounces (355 mL) of beer, 5 ounces (148 mL) of wine, or 1.5 ounces (44 mL) of liquor.  Avoid use of street drugs. Do not share needles with anyone. Ask for help if you need support or instructions about stopping the use of drugs.  High blood pressure causes heart disease and increases the risk of stroke. Your blood pressure should be checked at least every 1 2 years. Ongoing high blood pressure should be  treated with medicines, if weight loss and exercise are not effective.  If you are 75 59 years old, ask your health care provider if you should take aspirin to prevent heart disease.  Diabetes screening involves taking a blood sample to check your fasting blood sugar level. This should be done once every 3 years, after age 19, if you are within normal weight and without risk factors for diabetes. Testing should be considered at a younger age or be carried out more frequently if you are overweight and have at least 1 risk factor for diabetes.  Colorectal cancer can be detected and often prevented. Most routine colorectal cancer screening begins at the age of 47 and continues through age 80. However, your health care provider may recommend screening at an earlier age if you have risk factors for colon cancer. On a yearly basis, your health care provider may provide home test kits to check for hidden blood in the stool. Use of a small camera at the end of a tube to directly examine the colon (sigmoidoscopy or colonoscopy) can detect the earliest forms of colorectal cancer. Talk to your health care provider about this at age 66, when routine screening begins. Direct exam of the colon should be repeated every 5 10 years through age 19, unless early forms of precancerous polyps or small growths are found.  People who are at an increased risk for hepatitis B should be screened for this virus. You are considered at high risk for hepatitis B if:  You were born in a country where hepatitis B occurs often. Talk with your health care provider about which countries are considered high-risk.  Your parents were born in a high-risk country and you have not received a shot to protect against hepatitis B (hepatitis B vaccine).  You have HIV or AIDS.  You use needles to inject street drugs.  You live with, or have sex with, someone who has hepatitis B.  You are a man who has sex with other men (MSM).  You get  hemodialysis treatment.  You take certain medicines for conditions such as cancer, organ transplantation, and autoimmune conditions.  Hepatitis C blood testing is recommended for all people born from [birth year] through 1965 and any individual with known risks for hepatitis C.  Practice safe sex. Use condoms and avoid high-risk sexual practices to reduce the spread of sexually transmitted infections (STIs). STIs include gonorrhea, chlamydia, syphilis, trichomonas, herpes, HPV, and human immunodeficiency virus (HIV). Herpes, HIV, and HPV are viral illnesses that have no cure. They can result in disability, cancer, and death.  A one-time screening for abdominal aortic aneurysm (AAA) and surgical repair of large AAAs by ultrasound are recommended for men ages 94 to 74 years who are current or former smokers.  Healthy men should no longer receive prostate-specific antigen (PSA) blood tests as part of routine cancer screening. Talk with your health care provider about prostate cancer screening.  Testicular cancer screening is not recommended  for adult males who have no symptoms. Screening includes self-exam, a health care provider exam, and other screening tests. Consult with your health care provider about any symptoms you have or any concerns you have about testicular cancer.  Use sunscreen. Apply sunscreen liberally and repeatedly throughout the day. You should seek shade when your shadow is shorter than you. Protect yourself by wearing long sleeves, pants, a wide-brimmed hat, and sunglasses year round, whenever you are outdoors.  Once a month, do a whole-body skin exam, using a mirror to look at the skin on your back. Tell your health care provider about new moles, moles that have irregular borders, moles that are larger than a pencil eraser, or moles that have changed in shape or color.  Stay current with required vaccines (immunizations).  Influenza vaccine. All adults should be immunized every  year.  Tetanus, diphtheria, and acellular pertussis (Td, Tdap) vaccine. An adult who has not previously received Tdap or who does not know his vaccine status should receive 1 dose of Tdap. This initial dose should be followed by tetanus and diphtheria toxoids (Td) booster doses every 10 years. Adults with an unknown or incomplete history of completing a 3-dose immunization series with Td-containing vaccines should begin or complete a primary immunization series including a Tdap dose. Adults should receive a Td booster every 10 years.  Varicella vaccine. An adult without evidence of immunity to varicella should receive 2 doses or a second dose if he has previously received 1 dose.  Human papillomavirus (HPV) vaccine. Males aged 44 21 years who have not received the vaccine previously should receive the 3-dose series. Males aged 43 26 years may be immunized. Immunization is recommended through the age of 50 years for any male who has sex with males and did not get any or all doses earlier. Immunization is recommended for any person with an immunocompromised condition through the age of 23 years if he did not get any or all doses earlier. During the 3-dose series, the second dose should be obtained 4 8 weeks after the first dose. The third dose should be obtained 24 weeks after the first dose and 16 weeks after the second dose.  Zoster vaccine. One dose is recommended for adults aged 96 years or older unless certain conditions are present.  Measles, mumps, and rubella (MMR) vaccine. Adults born before 55 generally are considered immune to measles and mumps. Adults born in [birth year] or later should have 1 or more doses of MMR vaccine unless there is a contraindication to the vaccine or there is laboratory evidence of immunity to each of the three diseases. A routine second dose of MMR vaccine should be obtained at least 28 days after the first dose for students attending postsecondary schools, health care  workers, or international travelers. People who received inactivated measles vaccine or an unknown type of measles vaccine during 1963 1967 should receive 2 doses of MMR vaccine. People who received inactivated mumps vaccine or an unknown type of mumps vaccine before 1979 and are at high risk for mumps infection should consider immunization with 2 doses of MMR vaccine. Unvaccinated health care workers born before [birth year] who lack laboratory evidence of measles, mumps, or rubella immunity or laboratory confirmation of disease should consider measles and mumps immunization with 2 doses of MMR vaccine or rubella immunization with 1 dose of MMR vaccine.  Pneumococcal 13-valent conjugate (PCV13) vaccine. When indicated, a person who is uncertain of his immunization history and has no record of immunization  should receive the PCV13 vaccine. An adult aged 67 years or older who has certain medical conditions and has not been previously immunized should receive 1 dose of PCV13 vaccine. This PCV13 should be followed with a dose of pneumococcal polysaccharide (PPSV23) vaccine. The PPSV23 vaccine dose should be obtained at least 8 weeks after the dose of PCV13 vaccine. An adult aged 79 years or older who has certain medical conditions and previously received 1 or more doses of PPSV23 vaccine should receive 1 dose of PCV13. The PCV13 vaccine dose should be obtained 1 or more years after the last PPSV23 vaccine dose.  Pneumococcal polysaccharide (PPSV23) vaccine. When PCV13 is also indicated, PCV13 should be obtained first. All adults aged 74 years and older should be immunized. An adult younger than age 50 years who has certain medical conditions should be immunized. Any person who resides in a nursing home or long-term care facility should be immunized. An adult smoker should be immunized. People with an immunocompromised condition and certain other conditions should receive both PCV13 and PPSV23 vaccines. People with human  immunodeficiency virus (HIV) infection should be immunized as soon as possible after diagnosis. Immunization during chemotherapy or radiation therapy should be avoided. Routine use of PPSV23 vaccine is not recommended for American Indians, [place] Natives, or people younger than 65 years unless there are medical conditions that require PPSV23 vaccine. When indicated, people who have unknown immunization and have no record of immunization should receive PPSV23 vaccine. One-time revaccination 5 years after the first dose of PPSV23 is recommended for people aged 41 64 years who have chronic kidney failure, nephrotic syndrome, asplenia, or immunocompromised conditions. People who received 1 2 doses of PPSV23 before age 15 years should receive another dose of PPSV23 vaccine at age 48 years or later if at least 5 years have passed since the previous dose. Doses of PPSV23 are not needed for people immunized with PPSV23 at or after age 69 years.  Meningococcal vaccine. Adults with asplenia or persistent complement component deficiencies should receive 2 doses of quadrivalent meningococcal conjugate (MenACWY-D) vaccine. The doses should be obtained at least 2 months apart. Microbiologists working with certain meningococcal bacteria, [place] recruits, people at risk during an outbreak, and people who travel to or live in countries with a high rate of meningitis should be immunized. A first-year college student up through age 7 years who is living in a residence hall should receive a dose if he did not receive a dose on or after his 16th birthday. Adults who have certain high-risk conditions should receive one or more doses of vaccine.  Hepatitis A vaccine. Adults who wish to be protected from this disease, have certain high-risk conditions, work with hepatitis A-infected animals, work in hepatitis A research labs, or travel to or work in countries with a high rate of hepatitis A should be immunized. Adults who were  previously unvaccinated and who anticipate close contact with an international adoptee during the first 60 days after arrival in the [place] from a country with a high rate of hepatitis A should be immunized.  Hepatitis B vaccine. Adults who wish to be protected from this disease, have certain high-risk conditions, may be exposed to blood or other infectious body fluids, are household contacts or sex partners of hepatitis B positive people, are clients or workers in certain care facilities, or travel to or work in countries with a high rate of hepatitis B should be immunized.  Haemophilus influenzae type b (Hib) vaccine. A  previously unvaccinated person with asplenia or sickle cell disease or having a scheduled splenectomy should receive 1 dose of Hib vaccine. Regardless of previous immunization, a recipient of a hematopoietic stem cell transplant should receive a 3-dose series 6 12 months after his successful transplant. Hib vaccine is not recommended for adults with HIV infection. Preventive Service / Frequency Ages 62 to 3  Blood pressure check.** / Every 1 to 2 years.  Lipid and cholesterol check.** / Every 5 years beginning at age 43.  Hepatitis C blood test.** / For any individual with known risks for hepatitis C.  Skin self-exam. / Monthly.  Influenza vaccine. / Every year.  Tetanus, diphtheria, and acellular pertussis (Tdap, Td) vaccine.** / Consult your health care provider. 1 dose of Td every 10 years.  Varicella vaccine.** / Consult your health care provider.  HPV vaccine. / 3 doses over 6 months, if 48 or younger.  Measles, mumps, rubella (MMR) vaccine.** / You need at least 1 dose of MMR if you were born in 1957 or later. You may also need a second dose.  Pneumococcal 13-valent conjugate (PCV13) vaccine.** / Consult your health care provider.  Pneumococcal polysaccharide (PPSV23) vaccine.** / 1 to 2 doses if you smoke cigarettes or if you have certain  conditions.  Meningococcal vaccine.** / 1 dose if you are age 8 to 70 years and a Market researcher living in a residence hall, or have one of several medical conditions. You may also need additional booster doses.  Hepatitis A vaccine.** / Consult your health care provider.  Hepatitis B vaccine.** / Consult your health care provider.  Haemophilus influenzae type b (Hib) vaccine.** / Consult your health care provider. Ages 48 to 32  Blood pressure check.** / Every 1 to 2 years.  Lipid and cholesterol check.** / Every 5 years beginning at age 38.  Lung cancer screening. / Every year if you are aged 40 80 years and have a 30-pack-year history of smoking and currently smoke or have quit within the past 15 years. Yearly screening is stopped once you have quit smoking for at least 15 years or develop a health problem that would prevent you from having lung cancer treatment.  Fecal occult blood test (FOBT) of stool. / Every year beginning at age 4 and continuing until age 70. You may not have to do this test if you get a colonoscopy every 10 years.  Flexible sigmoidoscopy** or colonoscopy.** / Every 5 years for a flexible sigmoidoscopy or every 10 years for a colonoscopy beginning at age 76 and continuing until age 62.  Hepatitis C blood test.** / For all people born from [birth year] through 1965 and any individual with known risks for hepatitis C.  Skin self-exam. / Monthly.  Influenza vaccine. / Every year.  Tetanus, diphtheria, and acellular pertussis (Tdap/Td) vaccine.** / Consult your health care provider. 1 dose of Td every 10 years.  Varicella vaccine.** / Consult your health care provider.  Zoster vaccine.** / 1 dose for adults aged 60 years or older.  Measles, mumps, rubella (MMR) vaccine.** / You need at least 1 dose of MMR if you were born in 1957 or later. You may also need a second dose.  Pneumococcal 13-valent conjugate (PCV13) vaccine.** / Consult your health care  provider.  Pneumococcal polysaccharide (PPSV23) vaccine.** / 1 to 2 doses if you smoke cigarettes or if you have certain conditions.  Meningococcal vaccine.** / Consult your health care provider.  Hepatitis A vaccine.** / Consult your health care  provider.  Hepatitis B vaccine.** / Consult your health care provider.  Haemophilus influenzae type b (Hib) vaccine.** / Consult your health care provider. Ages 65 and over  Blood pressure check.** / Every 1 to 2 years.  Lipid and cholesterol check.**/ Every 5 years beginning at age 20.  Lung cancer screening. / Every year if you are aged 55 80 years and have a 30-pack-year history of smoking and currently smoke or have quit within the past 15 years. Yearly screening is stopped once you have quit smoking for at least 15 years or develop a health problem that would prevent you from having lung cancer treatment.  Fecal occult blood test (FOBT) of stool. / Every year beginning at age 50 and continuing until age 75. You may not have to do this test if you get a colonoscopy every 10 years.  Flexible sigmoidoscopy** or colonoscopy.** / Every 5 years for a flexible sigmoidoscopy or every 10 years for a colonoscopy beginning at age 50 and continuing until age 75.  Hepatitis C blood test.** / For all people born from 1945 through 1965 and any individual with known risks for hepatitis C.  Abdominal aortic aneurysm (AAA) screening.** / A one-time screening for ages 65 to 75 years who are current or former smokers.  Skin self-exam. / Monthly.  Influenza vaccine. / Every year.  Tetanus, diphtheria, and acellular pertussis (Tdap/Td) vaccine.** / 1 dose of Td every 10 years.  Varicella vaccine.** / Consult your health care provider.  Zoster vaccine.** / 1 dose for adults aged 60 years or older.  Pneumococcal 13-valent conjugate (PCV13) vaccine.** / Consult your health care provider.  Pneumococcal polysaccharide (PPSV23) vaccine.** / 1 dose for all  adults aged 65 years and older.  Meningococcal vaccine.** / Consult your health care provider.  Hepatitis A vaccine.** / Consult your health care provider.  Hepatitis B vaccine.** / Consult your health care provider.  Haemophilus influenzae type b (Hib) vaccine.** / Consult your health care provider. **Family history and personal history of risk and conditions may change your health care provider's recommendations. Document Released: 08/29/2001 Document Revised: 04/23/2013 Document Reviewed: 11/28/2010 [employer] Patient Information 2014 [employer].  

## 2013-12-02 NOTE — Progress Notes (Signed)
   Subjective:    Patient ID: Daniel Powers, male    DOB: May 23, 1956, 58 y.o.   MRN: [id number]  HPI Pt here for cpe and labs.  Pt c/o R knee pain after fall 08/30/13-- it has improved but going down steps is painful and that is not improving.  Standing is also hard.  Pt was seen by cardiology last month for L shoulder / arm pain and stress test was done -- he has not heard about result yet.  [    Review of Systems  Constitutional: Negative.   HENT: Negative for congestion, ear pain, hearing loss, nosebleeds, postnasal drip, rhinorrhea, sinus pressure, sneezing and tinnitus.   Eyes: Negative for photophobia, discharge, itching and visual disturbance.  Respiratory: Negative.   Cardiovascular: Negative.   Gastrointestinal: Negative for abdominal pain, constipation, blood in stool, abdominal distention and anal bleeding.  Endocrine: Negative.   Genitourinary: Negative.   Musculoskeletal: Positive for joint swelling.       R knee pain , swelling after fall 08/30/13  Skin: Negative.   Allergic/Immunologic: Negative.   Neurological: Negative for dizziness, weakness, light-headedness, numbness and headaches.  Psychiatric/Behavioral: Negative for suicidal ideas, confusion, sleep disturbance, dysphoric mood, decreased concentration and agitation. The patient is not nervous/anxious.        Objective:   Physical Exam  Constitutional: He is oriented to person, place, and time. He appears well-developed and well-nourished. No distress.  HENT:  Head: Normocephalic and atraumatic.  Right Ear: External ear normal.  Left Ear: External ear normal.  Nose: Nose normal.  Mouth/Throat: Oropharynx is clear and moist. No oropharyngeal exudate.  Eyes: Conjunctivae and EOM are normal. Pupils are equal, round, and reactive to light. Right eye exhibits no discharge. Left eye exhibits no discharge.  Neck: Normal range of motion. Neck supple. No JVD present. No thyromegaly present.  Cardiovascular: Normal rate,  regular rhythm and intact distal pulses.  Exam reveals no gallop and no friction rub.   No murmur heard. Pulmonary/Chest: Effort normal and breath sounds normal. No respiratory distress. He has no wheezes. He has no rales. He exhibits no tenderness.  Abdominal: Soft. Bowel sounds are normal. He exhibits no distension and no mass. There is no tenderness. There is no rebound and no guarding.  Genitourinary: Rectum normal, prostate normal and penis normal. Guaiac negative stool.  Musculoskeletal: Normal range of motion. He exhibits no edema.       Right knee: He exhibits swelling. Tenderness found.       Legs: Lymphadenopathy:    He has no cervical adenopathy.  Neurological: He is alert and oriented to person, place, and time. He displays normal reflexes. He exhibits normal muscle tone.  Skin: Skin is warm and dry. No rash noted. He is not diaphoretic. No erythema. No pallor.  Psychiatric: He has a normal mood and affect. His behavior is normal. Judgment and thought content normal.          Assessment & Plan:  1. Preventative health care Check labs ghm utd See avs - Basic metabolic panel - CBC with Differential - Hepatic function panel - Lipid panel - POCT urinalysis dipstick - TSH - PSA  2. Gout Check labs - Uric acid  3. HTN (hypertension) Stable con't meds - Basic metabolic panel  4. DVT (deep venous thrombosis) On coumadin - Protime-INR; Future  5. Right knee pain  - Ambulatory referral to Orthopedic Surgery

## 2013-12-02 NOTE — Progress Notes (Signed)
Pre visit review using our clinic review tool, if applicable. No additional management support is needed unless otherwise documented below in the visit note. 

## 2013-12-05 ENCOUNTER — Telehealth: Payer: Self-pay | Admitting: *Deleted

## 2013-12-05 DIAGNOSIS — E785 Hyperlipidemia, unspecified: Secondary | ICD-10-CM

## 2013-12-05 MED ORDER — ATORVASTATIN CALCIUM 10 MG PO TABS: ORAL_TABLET | ORAL | Status: DC

## 2013-12-05 NOTE — Telephone Encounter (Signed)
Spoke with the pt and informed he of recent lab results and note.  Pt understood and agreed.  Pt stated that he will call back and schedule an lab appt for 3 mos.  New rx sent to the pharmacy by e-script.  Future labs ordered and sent.//AB/CMA

## 2013-12-05 NOTE — Telephone Encounter (Signed)
Message copied by Harl Bowie on Fri Dec 05, 2013  2:03 PM ------      Message from: Rosalita Chessman      Created: Tue Dec 02, 2013  3:02 PM       Cholesterol--- LDL goal < 100,  HDL >40,  TG < 150.  Diet and exercise will increase HDL and decrease LDL and TG.  Fish,  Fish Oil, Flaxseed oil will also help increase the HDL and decrease Triglycerides.   Recheck labs in 3 months----- start lipitor 10 mg #30 1 po qhs , 2 refills----272.4  Lipid, hep.             ------

## 2013-12-24 ENCOUNTER — Other Ambulatory Visit: Payer: Self-pay | Admitting: Family Medicine

## 2013-12-25 ENCOUNTER — Other Ambulatory Visit: Payer: Self-pay | Admitting: Family Medicine

## 2014-01-15 NOTE — Telephone Encounter (Signed)
Encounter complete. 

## 2014-01-20 ENCOUNTER — Ambulatory Visit (INDEPENDENT_AMBULATORY_CARE_PROVIDER_SITE_OTHER): Payer: 59

## 2014-01-20 VITALS — BP 130/77 | HR 80 | Temp 98.6°F | Wt 324.2 lb

## 2014-01-20 DIAGNOSIS — Z7901 Long term (current) use of anticoagulants: Secondary | ICD-10-CM

## 2014-01-20 LAB — POCT INR: INR: 2.9

## 2014-01-20 NOTE — Patient Instructions (Addendum)
Continue to take Coumadin 2.5 mg on Wednesday, then 5 mg all other days. Recheck in 4 week (02/17/14 @ 0900).

## 2014-01-20 NOTE — Progress Notes (Signed)
Pre visit review using our clinic review tool, if applicable. No additional management support is needed unless otherwise documented below in the visit note. 

## 2014-01-26 ENCOUNTER — Other Ambulatory Visit: Payer: Self-pay | Admitting: Family Medicine

## 2014-01-26 ENCOUNTER — Other Ambulatory Visit: Payer: Self-pay

## 2014-01-26 MED ORDER — VALSARTAN 160 MG PO TABS: ORAL_TABLET | ORAL | Status: DC

## 2014-01-29 NOTE — Telephone Encounter (Signed)
Encounter complete. 

## 2014-02-17 ENCOUNTER — Ambulatory Visit (INDEPENDENT_AMBULATORY_CARE_PROVIDER_SITE_OTHER): Payer: 59 | Admitting: General Practice

## 2014-02-17 VITALS — BP 124/82 | HR 84 | Temp 98.1°F | Resp 16 | Wt 330.0 lb

## 2014-02-17 DIAGNOSIS — Z7901 Long term (current) use of anticoagulants: Secondary | ICD-10-CM

## 2014-02-17 LAB — POCT INR: INR: 2.9

## 2014-02-17 NOTE — Progress Notes (Signed)
Pre visit review using our clinic review tool, if applicable. No additional management support is needed unless otherwise documented below in the visit note. 

## 2014-02-17 NOTE — Patient Instructions (Signed)
Return: in 4 weeks Dose: 2.5mg  on wednesdays            5 mg all remaining days.

## 2014-02-19 ENCOUNTER — Telehealth: Payer: Self-pay | Admitting: *Deleted

## 2014-02-19 NOTE — Telephone Encounter (Signed)
Received prior authorization request for Diovan 160mg  via fax from [place].  Printed [employer] form on-line filled out as much as possible, and placed in folder for Dr. Etter Sjogren to complete.  Billing sheet attached.//AB/CMA

## 2014-02-25 NOTE — Telephone Encounter (Signed)
LMOM @ (3:54pm @ [phone]) asking the pt to RTC regarding prior authorization request.//AB/CMA

## 2014-02-26 NOTE — Telephone Encounter (Signed)
LMOM @ 8:45am @ [phone]) asking the pt to RTC regarding med change.//AB/CMA  Verbally informed Dr. Etter Sjogren of what the pt stated below.   Dr. Etter Sjogren gave verbal order to change the pt to [place] 320mg  1 x a day.  This is what the insurance is recommending.//AB/CMA

## 2014-02-26 NOTE — Telephone Encounter (Addendum)
Spoke with the pt on (02/25/14) and informed him that we are trying to get his Divon 160mg  bid approved by his insurance.  Informed him that Dr. Etter Sjogren wanted to know why he was taking the 160mg  bid, when he should be able to take 320mg  1 x a day.  Pt stated that 160mg  bid is what his other doctor placed him on before seeing Dr. Etter Sjogren, and he continued on it.  Pt stated that he is willing to change to 320mg  1 x a day.//AB/CMA

## 2014-02-26 NOTE — Telephone Encounter (Signed)
Daniel Powers called you back

## 2014-03-02 ENCOUNTER — Other Ambulatory Visit: Payer: Self-pay | Admitting: Family Medicine

## 2014-03-02 MED ORDER — VALSARTAN 320 MG PO TABS: 320.0000 mg | ORAL_TABLET | Freq: Every day | ORAL | Status: DC

## 2014-03-02 NOTE — Telephone Encounter (Signed)
Diovan 320 was filled to pharmacy today for a 90 day supply. Will see if it goes through.

## 2014-03-17 ENCOUNTER — Ambulatory Visit (INDEPENDENT_AMBULATORY_CARE_PROVIDER_SITE_OTHER): Payer: 59

## 2014-03-17 VITALS — BP 125/80 | HR 80 | Temp 97.8°F | Wt 331.8 lb

## 2014-03-17 DIAGNOSIS — Z7901 Long term (current) use of anticoagulants: Secondary | ICD-10-CM

## 2014-03-17 LAB — POCT INR: INR: 3.2

## 2014-03-17 NOTE — Progress Notes (Signed)
Pre visit review using our clinic review tool, if applicable. No additional management support is needed unless otherwise documented below in the visit note. 

## 2014-03-17 NOTE — Patient Instructions (Signed)
Take 5mg  all days except Wed take 2.5mg . Recheck in 2 weeks. Appt Tues 03/31/2014 at 9 a.m.  For upcoming travel; wear diabetic compression socks, move around the plane as much as possible, move legs for circulation while sitting for long periods of time per Dr Etter Sjogren.

## 2014-03-25 ENCOUNTER — Other Ambulatory Visit: Payer: Self-pay | Admitting: Family Medicine

## 2014-03-31 ENCOUNTER — Ambulatory Visit (INDEPENDENT_AMBULATORY_CARE_PROVIDER_SITE_OTHER): Payer: 59

## 2014-03-31 VITALS — BP 128/79 | HR 75 | Temp 98.3°F | Wt 336.8 lb

## 2014-03-31 DIAGNOSIS — Z7901 Long term (current) use of anticoagulants: Secondary | ICD-10-CM

## 2014-03-31 LAB — POCT INR: INR: 3.2

## 2014-03-31 NOTE — Patient Instructions (Addendum)
Take 2.5mg  today. Starting tomorrow take 2.5mg  Monday and Wed and 5mg  all other days.   Recheck in 2 weeks. Tuesday 9/29 at 9am.

## 2014-04-14 ENCOUNTER — Ambulatory Visit (INDEPENDENT_AMBULATORY_CARE_PROVIDER_SITE_OTHER): Payer: 59

## 2014-04-14 VITALS — BP 109/74 | HR 81 | Temp 99.0°F | Wt 335.6 lb

## 2014-04-14 DIAGNOSIS — Z86718 Personal history of other venous thrombosis and embolism: Secondary | ICD-10-CM

## 2014-04-14 LAB — POCT INR: INR: 2.2

## 2014-04-14 NOTE — Patient Instructions (Signed)
Today, your INR was 2.2.  You have reached your goal!  Continue to take Coumadin 5 mg every day, except on Monday and Wednesday take 2.5 mg.  Recheck in 4 weeks (around 05/12/14).

## 2014-04-14 NOTE — Progress Notes (Signed)
Pre visit review using our clinic review tool, if applicable. No additional management support is needed unless otherwise documented below in the visit note. 

## 2014-04-26 ENCOUNTER — Other Ambulatory Visit: Payer: Self-pay | Admitting: Family Medicine

## 2014-05-12 ENCOUNTER — Ambulatory Visit (INDEPENDENT_AMBULATORY_CARE_PROVIDER_SITE_OTHER): Payer: 59

## 2014-05-12 VITALS — BP 123/79 | HR 75 | Temp 98.3°F | Wt 335.2 lb

## 2014-05-12 DIAGNOSIS — Z7901 Long term (current) use of anticoagulants: Secondary | ICD-10-CM

## 2014-05-12 DIAGNOSIS — Z86718 Personal history of other venous thrombosis and embolism: Secondary | ICD-10-CM

## 2014-05-12 LAB — POCT INR: INR: 2.8

## 2014-05-12 NOTE — Patient Instructions (Addendum)
Continue to take Coumadin 5 mg every day, except on Monday and Wednesday take 2.5 mg.  Recheck in 4 weeks (around 06/09/14).  Appointment scheduled for Thursday, December 3rd at 9:00am.

## 2014-05-12 NOTE — Progress Notes (Signed)
Pre visit review using our clinic review tool, if applicable. No additional management support is needed unless otherwise documented below in the visit note. 

## 2014-06-18 ENCOUNTER — Ambulatory Visit (INDEPENDENT_AMBULATORY_CARE_PROVIDER_SITE_OTHER): Payer: 59

## 2014-06-18 VITALS — BP 132/71 | HR 81 | Temp 98.4°F | Wt 337.1 lb

## 2014-06-18 DIAGNOSIS — Z23 Encounter for immunization: Secondary | ICD-10-CM

## 2014-06-18 DIAGNOSIS — Z86718 Personal history of other venous thrombosis and embolism: Secondary | ICD-10-CM

## 2014-06-18 LAB — POCT INR: INR: 2.2

## 2014-06-18 NOTE — Progress Notes (Signed)
Pre visit review using our clinic review tool, if applicable. No additional management support is needed unless otherwise documented below in the visit note. 

## 2014-06-18 NOTE — Patient Instructions (Signed)
Continue to take Coumadin 5 mg every day, except on Monday and Wednesday take 2.5 mg.  Recheck in 4 weeks.

## 2014-06-18 NOTE — Progress Notes (Signed)
Pt requested flu shot today, no contraindications listed. Pt tolerated injection well.

## 2014-06-24 ENCOUNTER — Other Ambulatory Visit: Payer: Self-pay | Admitting: Family Medicine

## 2014-07-23 ENCOUNTER — Ambulatory Visit: Payer: 59 | Admitting: Family Medicine

## 2014-07-23 ENCOUNTER — Ambulatory Visit (INDEPENDENT_AMBULATORY_CARE_PROVIDER_SITE_OTHER): Payer: 59

## 2014-07-23 VITALS — BP 132/78 | HR 86 | Temp 98.3°F | Wt 333.8 lb

## 2014-07-23 DIAGNOSIS — Z7901 Long term (current) use of anticoagulants: Secondary | ICD-10-CM

## 2014-07-23 DIAGNOSIS — Z86718 Personal history of other venous thrombosis and embolism: Secondary | ICD-10-CM

## 2014-07-23 LAB — POCT INR: INR: 2.5

## 2014-07-23 NOTE — Patient Instructions (Signed)
Continue to take 1 tablet daily except 1/2 tablet on Mondays and Wednesdays.  Recheck INR in 6 weeks.

## 2014-07-23 NOTE — Progress Notes (Signed)
Pre visit review using our clinic review tool, if applicable. No additional management support is needed unless otherwise documented below in the visit note. 

## 2014-07-30 ENCOUNTER — Encounter: Payer: Self-pay | Admitting: Family Medicine

## 2014-07-30 ENCOUNTER — Ambulatory Visit (INDEPENDENT_AMBULATORY_CARE_PROVIDER_SITE_OTHER): Payer: 59 | Admitting: Family Medicine

## 2014-07-30 VITALS — BP 136/74 | HR 94 | Temp 98.1°F | Wt 334.4 lb

## 2014-07-30 DIAGNOSIS — E785 Hyperlipidemia, unspecified: Secondary | ICD-10-CM

## 2014-07-30 DIAGNOSIS — I10 Essential (primary) hypertension: Secondary | ICD-10-CM

## 2014-07-30 LAB — LIPID PANEL
CHOL/HDL RATIO: 4
CHOLESTEROL: 117 mg/dL (ref 0–200)
HDL: 30.7 mg/dL — ABNORMAL LOW (ref >39.00)
LDL Cholesterol: 71 mg/dL (ref 0–99)
NONHDL: 86.3
TRIGLYCERIDES: 79 mg/dL (ref 0.0–149.0)
VLDL: 15.8 mg/dL (ref 0.0–40.0)

## 2014-07-30 LAB — HEPATIC FUNCTION PANEL
ALT: 18 U/L (ref 0–53)
AST: 18 U/L (ref 0–37)
Albumin: 3.8 g/dL (ref 3.5–5.2)
Alkaline Phosphatase: 55 U/L (ref 39–117)
Bilirubin, Direct: 0.1 mg/dL (ref 0.0–0.3)
TOTAL PROTEIN: 7 g/dL (ref 6.0–8.3)
Total Bilirubin: 0.7 mg/dL (ref 0.2–1.2)

## 2014-07-30 LAB — BASIC METABOLIC PANEL
BUN: 12 mg/dL (ref 6–23)
CALCIUM: 9.1 mg/dL (ref 8.4–10.5)
CO2: 28 mEq/L (ref 19–32)
CREATININE: 1.03 mg/dL (ref 0.40–1.50)
Chloride: 107 mEq/L (ref 96–112)
GFR: 94.76 mL/min (ref >60.00)
GLUCOSE: 89 mg/dL (ref 70–99)
Potassium: 3.9 mEq/L (ref 3.5–5.1)
Sodium: 139 mEq/L (ref 135–145)

## 2014-07-30 NOTE — Progress Notes (Signed)
Pre visit review using our clinic review tool, if applicable. No additional management support is needed unless otherwise documented below in the visit note. 

## 2014-07-30 NOTE — Patient Instructions (Signed)

## 2014-07-30 NOTE — Progress Notes (Signed)
Subjective:    Patient here for follow-up of elevated blood pressure.  He is not exercising and is adherent to a low-salt diet.  Blood pressure is well controlled at home. Cardiac symptoms: none. Patient denies: chest pain, chest pressure/discomfort, claudication, dyspnea, exertional chest pressure/discomfort, fatigue, irregular heart beat, lower extremity edema, near-syncope, orthopnea, palpitations, paroxysmal nocturnal dyspnea, syncope and tachypnea. Cardiovascular risk factors: advanced age (older than 50 for men, 53 for women), dyslipidemia, hypertension, male gender, obesity (BMI >= 30 kg/m2) and sedentary lifestyle. Use of agents associated with hypertension: none. History of target organ damage: none.  The following portions of the patient's history were reviewed and updated as appropriate:  He  has a past medical history of Hypertension; DVT (deep venous thrombosis); Psychosexual dysfunction with inhibited sexual excitement; Gout; and Influenza with pneumonia. He  does not have any pertinent problems on file. He  has past surgical history that includes Tonsillectomy and adenoidectomy and Hand surgery. His family history includes Alcohol abuse in his father; Alzheimer's disease in his maternal grandmother, mother, and paternal aunt; Cancer in his sister; Esophageal cancer in his maternal uncle; Heart disease (age of onset: 105) in his brother; Hypertension in his father, maternal grandfather, maternal grandmother, mother, paternal grandfather, and paternal grandmother; Lung cancer in his maternal uncle. He  reports that he has never smoked. He has never used smokeless tobacco. He reports that he drinks alcohol. He reports that he does not use illicit drugs. He has a current medication list which includes the following prescription(s): albuterol, alprazolam, amlodipine, atorvastatin, cetirizine, fish oil-omega-3 fatty acids, flaxseed (linseed), tadalafil, valsartan, and warfarin. Current  Outpatient Prescriptions on File Prior to Visit  Medication Sig Dispense Refill  . albuterol (PROAIR HFA) 108 (90 BASE) MCG/ACT inhaler Inhale 2 puffs into the lungs every 6 (six) hours as needed for wheezing. 1 Inhaler 2  . ALPRAZolam (XANAX) 0.5 MG tablet Take 0.5 mg by mouth at bedtime as needed.    [name] amLODipine (NORVASC) 5 MG tablet TAKE 1 TABLET BY MOUTH DAILY 30 tablet 11  . atorvastatin (LIPITOR) 10 MG tablet Take 1 tablet (10 mg total) by mouth daily. Repeat labs are due now 30 tablet 0  . cetirizine (ZYRTEC) 10 MG tablet Take 10 mg by mouth daily.    . fish oil-omega-3 fatty acids 1000 MG capsule Take 1 g by mouth daily.    . Flaxseed, Linseed, 1000 MG CAPS Take 1 capsule by mouth daily.    . tadalafil (CIALIS) 20 MG tablet Take 20 mg by mouth daily as needed.    . valsartan (DIOVAN) 160 MG tablet Take 160 mg by mouth 2 (two) times daily.    [name] warfarin (COUMADIN) 5 MG tablet TAKE 1 TABLET BY MOUTH EVERY DAY AS DIRECTED 90 tablet 0   No current facility-administered medications on file prior to visit.   He is allergic to penicillins..  Review of Systems Pertinent items are noted in HPI.     Objective:    BP 136/74 mmHg  Pulse 94  Temp(Src) 98.1 F (36.7 C) (Oral)  Wt 334 lb 6.4 oz (151.683 kg)  SpO2 97% General appearance: alert, cooperative, appears stated age and no distress Nose: Nares normal. Septum midline. Mucosa normal. No drainage or sinus tenderness. Throat: lips, mucosa, and tongue normal; teeth and gums normal Neck: no adenopathy, supple, symmetrical, trachea midline and thyroid not enlarged, symmetric, no tenderness/mass/nodules Lungs: clear to auscultation bilaterally Heart: S1, S2 normal Extremities: extremities normal, atraumatic, no cyanosis or edema  Assessment:    Hypertension, normal blood pressure . Evidence of target organ damage: none.    Plan:    Medication: no change. Dietary sodium restriction. Regular aerobic exercise. Follow up: 6  months and as needed.    1. Hyperlipidemia Check labs - Basic metabolic panel - Hepatic function panel - Lipid panel  2. Essential hypertension con't lipitor, check labs - Basic metabolic panel - Hepatic function panel - Lipid panel  3. Hx DVT--- on coumadin

## 2014-08-01 ENCOUNTER — Other Ambulatory Visit: Payer: Self-pay | Admitting: Family Medicine

## 2014-08-06 ENCOUNTER — Encounter: Payer: Self-pay | Admitting: *Deleted

## 2014-09-03 ENCOUNTER — Ambulatory Visit (INDEPENDENT_AMBULATORY_CARE_PROVIDER_SITE_OTHER): Payer: 59 | Admitting: *Deleted

## 2014-09-03 VITALS — BP 149/87 | HR 87 | Temp 98.2°F | Resp 16 | Wt 333.2 lb

## 2014-09-03 DIAGNOSIS — Z86718 Personal history of other venous thrombosis and embolism: Secondary | ICD-10-CM

## 2014-09-03 LAB — POCT INR: INR: 2

## 2014-09-03 NOTE — Patient Instructions (Signed)
Continue to take 1 tablet daily except 1/2 tablet on Mondays and Wednesdays.  Recheck INR in 4 weeks.

## 2014-09-03 NOTE — Progress Notes (Signed)
Pre visit review using our clinic review tool, if applicable. No additional management support is needed unless otherwise documented below in the visit note. 

## 2014-10-06 ENCOUNTER — Ambulatory Visit (INDEPENDENT_AMBULATORY_CARE_PROVIDER_SITE_OTHER): Payer: 59 | Admitting: *Deleted

## 2014-10-06 VITALS — BP 129/73 | HR 83 | Temp 98.1°F | Resp 16 | Wt 336.2 lb

## 2014-10-06 DIAGNOSIS — Z86718 Personal history of other venous thrombosis and embolism: Secondary | ICD-10-CM

## 2014-10-06 LAB — POCT INR: INR: 2.2

## 2014-10-06 NOTE — Progress Notes (Signed)
Pre visit review using our clinic review tool, if applicable. No additional management support is needed unless otherwise documented below in the visit note. 

## 2014-10-06 NOTE — Patient Instructions (Signed)
Continue to take 1 tablet daily except 1/2 tablet on Mondays and Wednesdays.  Recheck INR in 4 weeks.

## 2014-11-03 ENCOUNTER — Ambulatory Visit (INDEPENDENT_AMBULATORY_CARE_PROVIDER_SITE_OTHER): Payer: 59 | Admitting: *Deleted

## 2014-11-03 VITALS — BP 127/81 | HR 81 | Temp 98.3°F | Resp 16 | Wt 336.8 lb

## 2014-11-03 DIAGNOSIS — Z86718 Personal history of other venous thrombosis and embolism: Secondary | ICD-10-CM | POA: Diagnosis not present

## 2014-11-03 LAB — POCT INR: INR: 2.3

## 2014-11-03 NOTE — Progress Notes (Signed)
Pre visit review using our clinic review tool, if applicable. No additional management support is needed unless otherwise documented below in the visit note. 

## 2014-11-03 NOTE — Patient Instructions (Signed)
Continue to take 1 tablet daily except 1/2 tablet on Mondays and Wednesdays.  Recheck INR in 4 weeks.

## 2014-11-04 ENCOUNTER — Telehealth: Payer: Self-pay | Admitting: Family Medicine

## 2014-11-04 NOTE — Telephone Encounter (Signed)
Pre Visit letter sent  °

## 2014-11-25 ENCOUNTER — Telehealth: Payer: Self-pay | Admitting: *Deleted

## 2014-11-25 ENCOUNTER — Encounter: Payer: Self-pay | Admitting: *Deleted

## 2014-11-25 NOTE — Telephone Encounter (Signed)
Pre-Visit Call completed with patient and chart updated.   Pre-Visit Info documented in Specialty Comments under [employer].    

## 2014-11-26 ENCOUNTER — Encounter: Payer: Self-pay | Admitting: Family Medicine

## 2014-11-26 ENCOUNTER — Ambulatory Visit (INDEPENDENT_AMBULATORY_CARE_PROVIDER_SITE_OTHER): Payer: 59 | Admitting: Family Medicine

## 2014-11-26 VITALS — BP 138/86 | HR 76 | Temp 99.0°F | Ht 71.0 in | Wt 333.0 lb

## 2014-11-26 DIAGNOSIS — Z125 Encounter for screening for malignant neoplasm of prostate: Secondary | ICD-10-CM

## 2014-11-26 DIAGNOSIS — E785 Hyperlipidemia, unspecified: Secondary | ICD-10-CM | POA: Diagnosis not present

## 2014-11-26 DIAGNOSIS — I10 Essential (primary) hypertension: Secondary | ICD-10-CM | POA: Diagnosis not present

## 2014-11-26 DIAGNOSIS — Z23 Encounter for immunization: Secondary | ICD-10-CM | POA: Diagnosis not present

## 2014-11-26 DIAGNOSIS — Z0000 Encounter for general adult medical examination without abnormal findings: Secondary | ICD-10-CM

## 2014-11-26 DIAGNOSIS — Z79818 Long term (current) use of other agents affecting estrogen receptors and estrogen levels: Secondary | ICD-10-CM | POA: Insufficient documentation

## 2014-11-26 DIAGNOSIS — Z7901 Long term (current) use of anticoagulants: Secondary | ICD-10-CM

## 2014-11-26 LAB — BASIC METABOLIC PANEL
BUN: 14 mg/dL (ref 6–23)
CALCIUM: 9.1 mg/dL (ref 8.4–10.5)
CO2: 30 mEq/L (ref 19–32)
CREATININE: 1.02 mg/dL (ref 0.40–1.50)
Chloride: 104 mEq/L (ref 96–112)
GFR: 95.72 mL/min (ref >60.00)
Glucose, Bld: 89 mg/dL (ref 70–99)
Potassium: 4 mEq/L (ref 3.5–5.1)
Sodium: 138 mEq/L (ref 135–145)

## 2014-11-26 LAB — CBC WITH DIFFERENTIAL/PLATELET
Basophils Absolute: 0 10*3/uL (ref 0.0–0.1)
Basophils Relative: 0.3 % (ref 0.0–3.0)
EOS PCT: 1.5 % (ref 0.0–5.0)
Eosinophils Absolute: 0.1 10*3/uL (ref 0.0–0.7)
HCT: 43.8 % (ref 39.0–52.0)
Hemoglobin: 14.7 g/dL (ref 13.0–17.0)
LYMPHS PCT: 28.2 % (ref 12.0–46.0)
Lymphs Abs: 2 10*3/uL (ref 0.7–4.0)
MCHC: 33.5 g/dL (ref 30.0–36.0)
MCV: 81.1 fl (ref 78.0–100.0)
MONOS PCT: 6.3 % (ref 3.0–12.0)
Monocytes Absolute: 0.4 10*3/uL (ref 0.1–1.0)
NEUTROS PCT: 63.7 % (ref 43.0–77.0)
Neutro Abs: 4.6 10*3/uL (ref 1.4–7.7)
PLATELETS: 227 10*3/uL (ref 150.0–400.0)
RBC: 5.4 Mil/uL (ref 4.22–5.81)
RDW: 15.3 % (ref 11.5–15.5)
WBC: 7.2 10*3/uL (ref 4.0–10.5)

## 2014-11-26 LAB — HEPATIC FUNCTION PANEL
ALBUMIN: 3.9 g/dL (ref 3.5–5.2)
ALT: 15 U/L (ref 0–53)
AST: 15 U/L (ref 0–37)
Alkaline Phosphatase: 52 U/L (ref 39–117)
BILIRUBIN TOTAL: 0.7 mg/dL (ref 0.2–1.2)
Bilirubin, Direct: 0.1 mg/dL (ref 0.0–0.3)
TOTAL PROTEIN: 6.7 g/dL (ref 6.0–8.3)

## 2014-11-26 LAB — TSH: TSH: 1.03 u[IU]/mL (ref 0.35–4.50)

## 2014-11-26 LAB — PSA: PSA: 0.58 ng/mL (ref 0.10–4.00)

## 2014-11-26 LAB — LIPID PANEL
Cholesterol: 122 mg/dL (ref 0–200)
HDL: 35.7 mg/dL — AB (ref >39.00)
LDL CALC: 74 mg/dL (ref 0–99)
NonHDL: 86.3
Total CHOL/HDL Ratio: 3
Triglycerides: 63 mg/dL (ref 0.0–149.0)
VLDL: 12.6 mg/dL (ref 0.0–40.0)

## 2014-11-26 LAB — POCT INR: INR: 1.8

## 2014-11-26 MED ORDER — WARFARIN SODIUM 5 MG PO TABS: 5.0000 mg | ORAL_TABLET | Freq: Every day | ORAL | Status: DC

## 2014-11-26 MED ORDER — VALSARTAN 160 MG PO TABS: 160.0000 mg | ORAL_TABLET | Freq: Two times a day (BID) | ORAL | Status: DC

## 2014-11-26 MED ORDER — AMLODIPINE BESYLATE 5 MG PO TABS: 5.0000 mg | ORAL_TABLET | Freq: Every day | ORAL | Status: DC

## 2014-11-26 NOTE — Patient Instructions (Addendum)
Preventive Care for Adults A healthy lifestyle and preventive care can promote health and wellness. Preventive health guidelines for men include the following key practices:  A routine yearly physical is a good way to check with your health care provider about your health and preventative screening. It is a chance to share any concerns and updates on your health and to receive a thorough exam.  Visit your dentist for a routine exam and preventative care every 6 months. Brush your teeth twice a day and floss once a day. Good oral hygiene prevents tooth decay and gum disease.  The frequency of eye exams is based on your age, health, family medical history, use of contact lenses, and other factors. Follow your health care provider's recommendations for frequency of eye exams.  Eat a healthy diet. Foods such as vegetables, fruits, whole grains, low-fat dairy products, and lean protein foods contain the nutrients you need without too many calories. Decrease your intake of foods high in solid fats, added sugars, and salt. Eat the right amount of calories for you.Get information about a proper diet from your health care provider, if necessary.  Regular physical exercise is one of the most important things you can do for your health. Most adults should get at least 150 minutes of moderate-intensity exercise (any activity that increases your heart rate and causes you to sweat) each week. In addition, most adults need muscle-strengthening exercises on 2 or more days a week.  Maintain a healthy weight. The body mass index (BMI) is a screening tool to identify possible weight problems. It provides an estimate of body fat based on height and weight. Your health care provider can find your BMI and can help you achieve or maintain a healthy weight.For adults 20 years and older:  A BMI below 18.5 is considered underweight.  A BMI of 18.5 to 24.9 is normal.  A BMI of 25 to 29.9 is considered overweight.  A BMI  of 30 and above is considered obese.  Maintain normal blood lipids and cholesterol levels by exercising and minimizing your intake of saturated fat. Eat a balanced diet with plenty of fruit and vegetables. Blood tests for lipids and cholesterol should begin at age 50 and be repeated every 5 years. If your lipid or cholesterol levels are high, you are over 50, or you are at high risk for heart disease, you may need your cholesterol levels checked more frequently.Ongoing high lipid and cholesterol levels should be treated with medicines if diet and exercise are not working.  If you smoke, find out from your health care provider how to quit. If you do not use tobacco, do not start.  Lung cancer screening is recommended for adults aged 73-80 years who are at high risk for developing lung cancer because of a history of smoking. A yearly low-dose CT scan of the lungs is recommended for people who have at least a 30-pack-year history of smoking and are a current smoker or have quit within the past 15 years. A pack year of smoking is smoking an average of 1 pack of cigarettes a day for 1 year (for example: 1 pack a day for 30 years or 2 packs a day for 15 years). Yearly screening should continue until the smoker has stopped smoking for at least 15 years. Yearly screening should be stopped for people who develop a health problem that would prevent them from having lung cancer treatment.  If you choose to drink alcohol, do not have more than  2 drinks per day. One drink is considered to be 12 ounces (355 mL) of beer, 5 ounces (148 mL) of wine, or 1.5 ounces (44 mL) of liquor.  Avoid use of street drugs. Do not share needles with anyone. Ask for help if you need support or instructions about stopping the use of drugs.  High blood pressure causes heart disease and increases the risk of stroke. Your blood pressure should be checked at least every 1-2 years. Ongoing high blood pressure should be treated with  medicines, if weight loss and exercise are not effective.  If you are 45-79 years old, ask your health care provider if you should take aspirin to prevent heart disease.  Diabetes screening involves taking a blood sample to check your fasting blood sugar level. This should be done once every 3 years, after age 45, if you are within normal weight and without risk factors for diabetes. Testing should be considered at a younger age or be carried out more frequently if you are overweight and have at least 1 risk factor for diabetes.  Colorectal cancer can be detected and often prevented. Most routine colorectal cancer screening begins at the age of 50 and continues through age 75. However, your health care provider may recommend screening at an earlier age if you have risk factors for colon cancer. On a yearly basis, your health care provider may provide home test kits to check for hidden blood in the stool. Use of a small camera at the end of a tube to directly examine the colon (sigmoidoscopy or colonoscopy) can detect the earliest forms of colorectal cancer. Talk to your health care provider about this at age 50, when routine screening begins. Direct exam of the colon should be repeated every 5-10 years through age 75, unless early forms of precancerous polyps or small growths are found.  People who are at an increased risk for hepatitis B should be screened for this virus. You are considered at high risk for hepatitis B if:  You were born in a country where hepatitis B occurs often. Talk with your health care provider about which countries are considered high risk.  Your parents were born in a high-risk country and you have not received a shot to protect against hepatitis B (hepatitis B vaccine).  You have HIV or AIDS.  You use needles to inject street drugs.  You live with, or have sex with, someone who has hepatitis B.  You are a man who has sex with other men (MSM).  You get hemodialysis  treatment.  You take certain medicines for conditions such as cancer, organ transplantation, and autoimmune conditions.  Hepatitis C blood testing is recommended for all people born from 1945 through 1965 and any individual with known risks for hepatitis C.  Practice safe sex. Use condoms and avoid high-risk sexual practices to reduce the spread of sexually transmitted infections (STIs). STIs include gonorrhea, chlamydia, syphilis, trichomonas, herpes, HPV, and human immunodeficiency virus (HIV). Herpes, HIV, and HPV are viral illnesses that have no cure. They can result in disability, cancer, and death.  If you are at risk of being infected with HIV, it is recommended that you take a prescription medicine daily to prevent HIV infection. This is called preexposure prophylaxis (PrEP). You are considered at risk if:  You are a man who has sex with other men (MSM) and have other risk factors.  You are a heterosexual man, are sexually active, and are at increased risk for HIV infection.    You take drugs by injection.  You are sexually active with a partner who has HIV.  Talk with your health care provider about whether you are at high risk of being infected with HIV. If you choose to begin PrEP, you should first be tested for HIV. You should then be tested every 3 months for as long as you are taking PrEP.  A one-time screening for abdominal aortic aneurysm (AAA) and surgical repair of large AAAs by ultrasound are recommended for men ages 32 to 67 years who are current or former smokers.  Healthy men should no longer receive prostate-specific antigen (PSA) blood tests as part of routine cancer screening. Talk with your health care provider about prostate cancer screening.  Testicular cancer screening is not recommended for adult males who have no symptoms. Screening includes self-exam, a health care provider exam, and other screening tests. Consult with your health care provider about any symptoms  you have or any concerns you have about testicular cancer.  Use sunscreen. Apply sunscreen liberally and repeatedly throughout the day. You should seek shade when your shadow is shorter than you. Protect yourself by wearing long sleeves, pants, a wide-brimmed hat, and sunglasses year round, whenever you are outdoors.  Once a month, do a whole-body skin exam, using a mirror to look at the skin on your back. Tell your health care provider about new moles, moles that have irregular borders, moles that are larger than a pencil eraser, or moles that have changed in shape or color.  Stay current with required vaccines (immunizations).  Influenza vaccine. All adults should be immunized every year.  Tetanus, diphtheria, and acellular pertussis (Td, Tdap) vaccine. An adult who has not previously received Tdap or who does not know his vaccine status should receive 1 dose of Tdap. This initial dose should be followed by tetanus and diphtheria toxoids (Td) booster doses every 10 years. Adults with an unknown or incomplete history of completing a 3-dose immunization series with Td-containing vaccines should begin or complete a primary immunization series including a Tdap dose. Adults should receive a Td booster every 10 years.  Varicella vaccine. An adult without evidence of immunity to varicella should receive 2 doses or a second dose if he has previously received 1 dose.  Human papillomavirus (HPV) vaccine. Males aged 68-21 years who have not received the vaccine previously should receive the 3-dose series. Males aged 22-26 years may be immunized. Immunization is recommended through the age of 6 years for any male who has sex with males and did not get any or all doses earlier. Immunization is recommended for any person with an immunocompromised condition through the age of 49 years if he did not get any or all doses earlier. During the 3-dose series, the second dose should be obtained 4-8 weeks after the first  dose. The third dose should be obtained 24 weeks after the first dose and 16 weeks after the second dose.  Zoster vaccine. One dose is recommended for adults aged 50 years or older unless certain conditions are present.  Measles, mumps, and rubella (MMR) vaccine. Adults born before [birth year] generally are considered immune to measles and mumps. Adults born in [birth year] or later should have 1 or more doses of MMR vaccine unless there is a contraindication to the vaccine or there is laboratory evidence of immunity to each of the three diseases. A routine second dose of MMR vaccine should be obtained at least 28 days after the first dose for students attending postsecondary  schools, health care workers, or international travelers. People who received inactivated measles vaccine or an unknown type of measles vaccine during 1963-1967 should receive 2 doses of MMR vaccine. People who received inactivated mumps vaccine or an unknown type of mumps vaccine before 1979 and are at high risk for mumps infection should consider immunization with 2 doses of MMR vaccine. Unvaccinated health care workers born before 1957 who lack laboratory evidence of measles, mumps, or rubella immunity or laboratory confirmation of disease should consider measles and mumps immunization with 2 doses of MMR vaccine or rubella immunization with 1 dose of MMR vaccine.  Pneumococcal 13-valent conjugate (PCV13) vaccine. When indicated, a person who is uncertain of his immunization history and has no record of immunization should receive the PCV13 vaccine. An adult aged 19 years or older who has certain medical conditions and has not been previously immunized should receive 1 dose of PCV13 vaccine. This PCV13 should be followed with a dose of pneumococcal polysaccharide (PPSV23) vaccine. The PPSV23 vaccine dose should be obtained at least 8 weeks after the dose of PCV13 vaccine. An adult aged 19 years or older who has certain medical conditions and  previously received 1 or more doses of PPSV23 vaccine should receive 1 dose of PCV13. The PCV13 vaccine dose should be obtained 1 or more years after the last PPSV23 vaccine dose.  Pneumococcal polysaccharide (PPSV23) vaccine. When PCV13 is also indicated, PCV13 should be obtained first. All adults aged 65 years and older should be immunized. An adult younger than age 65 years who has certain medical conditions should be immunized. Any person who resides in a nursing home or long-term care facility should be immunized. An adult smoker should be immunized. People with an immunocompromised condition and certain other conditions should receive both PCV13 and PPSV23 vaccines. People with human immunodeficiency virus (HIV) infection should be immunized as soon as possible after diagnosis. Immunization during chemotherapy or radiation therapy should be avoided. Routine use of PPSV23 vaccine is not recommended for American Indians, [place] Natives, or people younger than 65 years unless there are medical conditions that require PPSV23 vaccine. When indicated, people who have unknown immunization and have no record of immunization should receive PPSV23 vaccine. One-time revaccination 5 years after the first dose of PPSV23 is recommended for people aged 19-64 years who have chronic kidney failure, nephrotic syndrome, asplenia, or immunocompromised conditions. People who received 1-2 doses of PPSV23 before age 65 years should receive another dose of PPSV23 vaccine at age 65 years or later if at least 5 years have passed since the previous dose. Doses of PPSV23 are not needed for people immunized with PPSV23 at or after age 65 years.  Meningococcal vaccine. Adults with asplenia or persistent complement component deficiencies should receive 2 doses of quadrivalent meningococcal conjugate (MenACWY-D) vaccine. The doses should be obtained at least 2 months apart. Microbiologists working with certain meningococcal bacteria,  military recruits, people at risk during an outbreak, and people who travel to or live in countries with a high rate of meningitis should be immunized. A first-year college student up through age 21 years who is living in a residence hall should receive a dose if he did not receive a dose on or after his 16th birthday. Adults who have certain high-risk conditions should receive one or more doses of vaccine.  Hepatitis A vaccine. Adults who wish to be protected from this disease, have certain high-risk conditions, work with hepatitis A-infected animals, work in hepatitis A research labs, or   travel to or work in countries with a high rate of hepatitis A should be immunized. Adults who were previously unvaccinated and who anticipate close contact with an international adoptee during the first 60 days after arrival in the [place] from a country with a high rate of hepatitis A should be immunized.  Hepatitis B vaccine. Adults should be immunized if they wish to be protected from this disease, have certain high-risk conditions, may be exposed to blood or other infectious body fluids, are household contacts or sex partners of hepatitis B positive people, are clients or workers in certain care facilities, or travel to or work in countries with a high rate of hepatitis B.  Haemophilus influenzae type b (Hib) vaccine. A previously unvaccinated person with asplenia or sickle cell disease or having a scheduled splenectomy should receive 1 dose of Hib vaccine. Regardless of previous immunization, a recipient of a hematopoietic stem cell transplant should receive a 3-dose series 6-12 months after his successful transplant. Hib vaccine is not recommended for adults with HIV infection. Preventive Service / Frequency Ages 52 to 17  Blood pressure check.** / Every 1 to 2 years.  Lipid and cholesterol check.** / Every 5 years beginning at age 69.  Hepatitis C blood test.** / For any individual with known risks for  hepatitis C.  Skin self-exam. / Monthly.  Influenza vaccine. / Every year.  Tetanus, diphtheria, and acellular pertussis (Tdap, Td) vaccine.** / Consult your health care provider. 1 dose of Td every 10 years.  Varicella vaccine.** / Consult your health care provider.  HPV vaccine. / 3 doses over 6 months, if 72 or younger.  Measles, mumps, rubella (MMR) vaccine.** / You need at least 1 dose of MMR if you were born in 1957 or later. You may also need a second dose.  Pneumococcal 13-valent conjugate (PCV13) vaccine.** / Consult your health care provider.  Pneumococcal polysaccharide (PPSV23) vaccine.** / 1 to 2 doses if you smoke cigarettes or if you have certain conditions.  Meningococcal vaccine.** / 1 dose if you are age 35 to 60 years and a Market researcher living in a residence hall, or have one of several medical conditions. You may also need additional booster doses.  Hepatitis A vaccine.** / Consult your health care provider.  Hepatitis B vaccine.** / Consult your health care provider.  Haemophilus influenzae type b (Hib) vaccine.** / Consult your health care provider. Ages 35 to 8  Blood pressure check.** / Every 1 to 2 years.  Lipid and cholesterol check.** / Every 5 years beginning at age 57.  Lung cancer screening. / Every year if you are aged 44-80 years and have a 30-pack-year history of smoking and currently smoke or have quit within the past 15 years. Yearly screening is stopped once you have quit smoking for at least 15 years or develop a health problem that would prevent you from having lung cancer treatment.  Fecal occult blood test (FOBT) of stool. / Every year beginning at age 55 and continuing until age 73. You may not have to do this test if you get a colonoscopy every 10 years.  Flexible sigmoidoscopy** or colonoscopy.** / Every 5 years for a flexible sigmoidoscopy or every 10 years for a colonoscopy beginning at age 28 and continuing until age  1.  Hepatitis C blood test.** / For all people born from [birth year] through 1965 and any individual with known risks for hepatitis C.  Skin self-exam. / Monthly.  Influenza vaccine. / Every  year.  Tetanus, diphtheria, and acellular pertussis (Tdap/Td) vaccine.** / Consult your health care provider. 1 dose of Td every 10 years.  Varicella vaccine.** / Consult your health care provider.  Zoster vaccine.** / 1 dose for adults aged 60 years or older.  Measles, mumps, rubella (MMR) vaccine.** / You need at least 1 dose of MMR if you were born in 1957 or later. You may also need a second dose.  Pneumococcal 13-valent conjugate (PCV13) vaccine.** / Consult your health care provider.  Pneumococcal polysaccharide (PPSV23) vaccine.** / 1 to 2 doses if you smoke cigarettes or if you have certain conditions.  Meningococcal vaccine.** / Consult your health care provider.  Hepatitis A vaccine.** / Consult your health care provider.  Hepatitis B vaccine.** / Consult your health care provider.  Haemophilus influenzae type b (Hib) vaccine.** / Consult your health care provider. Ages 65 and over  Blood pressure check.** / Every 1 to 2 years.  Lipid and cholesterol check.**/ Every 5 years beginning at age 20.  Lung cancer screening. / Every year if you are aged 55-80 years and have a 30-pack-year history of smoking and currently smoke or have quit within the past 15 years. Yearly screening is stopped once you have quit smoking for at least 15 years or develop a health problem that would prevent you from having lung cancer treatment.  Fecal occult blood test (FOBT) of stool. / Every year beginning at age 50 and continuing until age 75. You may not have to do this test if you get a colonoscopy every 10 years.  Flexible sigmoidoscopy** or colonoscopy.** / Every 5 years for a flexible sigmoidoscopy or every 10 years for a colonoscopy beginning at age 50 and continuing until age 75.  Hepatitis C blood  test.** / For all people born from 1945 through 1965 and any individual with known risks for hepatitis C.  Abdominal aortic aneurysm (AAA) screening.** / A one-time screening for ages 65 to 75 years who are current or former smokers.  Skin self-exam. / Monthly.  Influenza vaccine. / Every year.  Tetanus, diphtheria, and acellular pertussis (Tdap/Td) vaccine.** / 1 dose of Td every 10 years.  Varicella vaccine.** / Consult your health care provider.  Zoster vaccine.** / 1 dose for adults aged 60 years or older.  Pneumococcal 13-valent conjugate (PCV13) vaccine.** / Consult your health care provider.  Pneumococcal polysaccharide (PPSV23) vaccine.** / 1 dose for all adults aged 65 years and older.  Meningococcal vaccine.** / Consult your health care provider.  Hepatitis A vaccine.** / Consult your health care provider.  Hepatitis B vaccine.** / Consult your health care provider.  Haemophilus influenzae type b (Hib) vaccine.** / Consult your health care provider. **Family history and personal history of risk and conditions may change your health care provider's recommendations. Document Released: 08/29/2001 Document Revised: 07/08/2013 Document Reviewed: 11/28/2010 [employer] Patient Information 2015 [employer]. This information is not intended to replace advice given to you by your health care provider. Make sure you discuss any questions you have with your health care provider.  

## 2014-11-26 NOTE — Assessment & Plan Note (Signed)
Check labs ghm utd See AVS 

## 2014-11-26 NOTE — Progress Notes (Signed)
Patient ID: Daniel Powers, male    DOB: Aug 03, 1955  Age: 59 y.o. MRN: [id number]    Subjective:  Subjective HPI Daniel Powers presents for cpe.  No complaints.    Review of Systems  Constitutional: Negative.   HENT: Negative for congestion, ear pain, hearing loss, nosebleeds, postnasal drip, rhinorrhea, sinus pressure, sneezing and tinnitus.   Eyes: Negative for photophobia, discharge, itching and visual disturbance.  Respiratory: Negative.   Cardiovascular: Negative.   Gastrointestinal: Negative for abdominal pain, constipation, blood in stool, abdominal distention and anal bleeding.  Endocrine: Negative.   Genitourinary: Negative.   Musculoskeletal: Negative.   Skin: Negative.   Allergic/Immunologic: Negative.   Neurological: Negative for dizziness, weakness, light-headedness, numbness and headaches.  Psychiatric/Behavioral: Negative for suicidal ideas, confusion, sleep disturbance, dysphoric mood, decreased concentration and agitation. The patient is not nervous/anxious.   opth- annually-- [place] [facility]ist-- Daniel Powers-- W/S History Past Medical History  Diagnosis Date  . Hypertension   . DVT (deep venous thrombosis)   . Psychosexual dysfunction with inhibited sexual excitement   . Gout   . Influenza with pneumonia     He has past surgical history that includes Tonsillectomy and adenoidectomy and Hand surgery.   His family history includes Alcohol abuse in his father; Alzheimer's disease in his maternal grandmother, mother, and paternal aunt; Cancer in his sister; Esophageal cancer in his maternal uncle; Heart disease (age of onset: 70) in his brother; Hypertension in his father, maternal grandfather, maternal grandmother, mother, paternal grandfather, and paternal grandmother; Lung cancer in his maternal uncle.He reports that he has never smoked. He has never used smokeless tobacco. He reports that he drinks alcohol. He reports that he does not use illicit drugs.  Current  Outpatient Prescriptions on File Prior to Visit  Medication Sig Dispense Refill  . albuterol (PROAIR HFA) 108 (90 BASE) MCG/ACT inhaler Inhale 2 puffs into the lungs every 6 (six) hours as needed for wheezing. 1 Inhaler 2  . ALPRAZolam (XANAX) 0.5 MG tablet Take 0.5 mg by mouth at bedtime as needed.    [name] atorvastatin (LIPITOR) 10 MG tablet TAKE 1 TABLET BY MOUTH EVERY DAY 90 tablet 1  . cetirizine (ZYRTEC) 10 MG tablet Take 10 mg by mouth daily.    . fish oil-omega-3 fatty acids 1000 MG capsule Take 1 g by mouth daily.    . Flaxseed, Linseed, 1000 MG CAPS Take 1 capsule by mouth daily.    . tadalafil (CIALIS) 20 MG tablet Take 20 mg by mouth daily as needed.     No current facility-administered medications on file prior to visit.     Objective:  Objective Physical Exam  Constitutional: He is oriented to person, place, and time. He appears well-developed and well-nourished. No distress.  HENT:  Head: Normocephalic and atraumatic.  Right Ear: External ear normal.  Left Ear: External ear normal.  Nose: Nose normal.  Mouth/Throat: Oropharynx is clear and moist. No oropharyngeal exudate.  Eyes: Conjunctivae and EOM are normal. Pupils are equal, round, and reactive to light. Right eye exhibits no discharge. Left eye exhibits no discharge.  Neck: Normal range of motion. Neck supple. No JVD present. No thyromegaly present.  Cardiovascular: Normal rate, regular rhythm and intact distal pulses.  Exam reveals no gallop and no friction rub.   No murmur heard. Pulmonary/Chest: Effort normal and breath sounds normal. No respiratory distress. He has no wheezes. He has no rales. He exhibits no tenderness.  Abdominal: Soft. Bowel sounds are normal. He exhibits no  distension and no mass. There is no tenderness. There is no rebound and no guarding.  Genitourinary:  urology  Musculoskeletal: Normal range of motion. He exhibits no edema or tenderness.  Lymphadenopathy:    He has no cervical adenopathy.    Neurological: He is alert and oriented to person, place, and time. He displays normal reflexes. He exhibits normal muscle tone.  Skin: Skin is warm and dry. No rash noted. He is not diaphoretic. No erythema. No pallor.  Psychiatric: He has a normal mood and affect. His behavior is normal. Judgment and thought content normal.   BP 138/86 mmHg  Pulse 76  Temp(Src) 99 F (37.2 C) (Oral)  Ht 5\' 11"  (1.803 m)  Wt 333 lb (151.048 kg)  BMI 46.46 kg/m2  SpO2 98% Wt Readings from Last 3 Encounters:  11/26/14 333 lb (151.048 kg)  11/03/14 336 lb 12.8 oz (152.771 kg)  10/06/14 336 lb 3.2 oz (152.499 kg)     Lab Results  Component Value Date   WBC 7.2 11/26/2014   HGB 14.7 11/26/2014   HCT 43.8 11/26/2014   PLT 227.0 11/26/2014   GLUCOSE 89 11/26/2014   CHOL 122 11/26/2014   TRIG 63.0 11/26/2014   HDL 35.70* 11/26/2014   LDLCALC 74 11/26/2014   ALT 15 11/26/2014   AST 15 11/26/2014   NA 138 11/26/2014   K 4.0 11/26/2014   CL 104 11/26/2014   CREATININE 1.02 11/26/2014   BUN 14 11/26/2014   CO2 30 11/26/2014   TSH 1.03 11/26/2014   PSA 0.58 11/26/2014   INR 1.8 11/26/2014   MICROALBUR 1.3 10/22/2012    No results found.   Assessment & Plan:  Plan I have changed Daniel Powers's amLODipine, valsartan, and warfarin. I am also having him maintain his fish oil-omega-3 fatty acids, tadalafil, Flaxseed (Linseed), ALPRAZolam, albuterol, cetirizine, and atorvastatin.  Meds ordered this encounter  Medications  . amLODipine (NORVASC) 5 MG tablet    Sig: Take 1 tablet (5 mg total) by mouth daily.    Dispense:  90 tablet    Refill:  3    D/C PREVIOUS SCRIPTS FOR THIS MEDICATION  . valsartan (DIOVAN) 160 MG tablet    Sig: Take 1 tablet (160 mg total) by mouth 2 (two) times daily.    Dispense:  180 tablet    Refill:  3    D/C PREVIOUS SCRIPTS FOR THIS MEDICATION  . warfarin (COUMADIN) 5 MG tablet    Sig: Take 1 tablet (5 mg total) by mouth daily.    Dispense:  90 tablet    Refill:   0    **Patient requests 90 days supply**    Problem List Items Addressed This Visit    Preventative use of agents affecting estrogen receptors or levels   Preventative health care    Check labs ghm utd See AVS      Relevant Orders   Basic metabolic panel (Completed)   CBC with Differential/Platelet (Completed)   Hepatic function panel (Completed)   Lipid panel (Completed)   POCT urinalysis dipstick   TSH (Completed)   PSA (Completed)   HIV antibody    Other Visit Diagnoses    Long term current use of anticoagulant    -  Primary    Relevant Orders    POCT INR (Completed)    Essential hypertension        Relevant Medications    amLODipine (NORVASC) 5 MG tablet    valsartan (DIOVAN) 160 MG tablet  warfarin (COUMADIN) 5 MG tablet    Other Relevant Orders    Basic metabolic panel (Completed)    CBC with Differential/Platelet (Completed)    POCT urinalysis dipstick    Hyperlipidemia        Relevant Medications    amLODipine (NORVASC) 5 MG tablet    valsartan (DIOVAN) 160 MG tablet    warfarin (COUMADIN) 5 MG tablet    Other Relevant Orders    Hepatic function panel (Completed)    Lipid panel (Completed)    Need for shingles vaccine        Relevant Orders    Varicella-zoster vaccine subcutaneous (Completed)       Follow-up: Return in about 2 weeks (around 12/10/2014), or if symptoms worsen or fail to improve, for PT check.  Garnet Koyanagi, DO

## 2014-11-26 NOTE — Progress Notes (Signed)
Pre visit review using our clinic review tool, if applicable. No additional management support is needed unless otherwise documented below in the visit note. 

## 2014-11-27 LAB — POCT URINALYSIS DIPSTICK
BILIRUBIN UA: NEGATIVE
Glucose, UA: NEGATIVE
Ketones, UA: NEGATIVE
LEUKOCYTES UA: NEGATIVE
Nitrite, UA: NEGATIVE
PH UA: 6
RBC UA: NEGATIVE
Urobilinogen, UA: 4

## 2014-11-27 LAB — HIV ANTIBODY (ROUTINE TESTING W REFLEX): HIV: NONREACTIVE

## 2014-12-03 ENCOUNTER — Ambulatory Visit (INDEPENDENT_AMBULATORY_CARE_PROVIDER_SITE_OTHER): Payer: 59 | Admitting: *Deleted

## 2014-12-03 VITALS — BP 126/83 | HR 67 | Temp 98.1°F | Resp 16 | Wt 332.6 lb

## 2014-12-03 DIAGNOSIS — Z86718 Personal history of other venous thrombosis and embolism: Secondary | ICD-10-CM

## 2014-12-03 LAB — POCT INR: INR: 1.9

## 2014-12-03 NOTE — Patient Instructions (Signed)
Take 1 tablet (5mg ) daily except 1/2 tablet on Wednesdays.  Recheck INR in 2 weeks.

## 2014-12-03 NOTE — Progress Notes (Signed)
Pre visit review using our clinic review tool, if applicable. No additional management support is needed unless otherwise documented below in the visit note. 

## 2014-12-17 ENCOUNTER — Ambulatory Visit (INDEPENDENT_AMBULATORY_CARE_PROVIDER_SITE_OTHER): Payer: 59 | Admitting: *Deleted

## 2014-12-17 VITALS — BP 129/85 | HR 80 | Temp 98.3°F | Resp 18 | Ht 71.0 in | Wt 331.2 lb

## 2014-12-17 DIAGNOSIS — Z86718 Personal history of other venous thrombosis and embolism: Secondary | ICD-10-CM | POA: Diagnosis not present

## 2014-12-17 LAB — POCT INR: INR: 2.4

## 2014-12-17 NOTE — Progress Notes (Signed)
Pre visit review using our clinic review tool, if applicable. No additional management support is needed unless otherwise documented below in the visit note. 

## 2014-12-17 NOTE — Patient Instructions (Addendum)
Take 1 tablet (5mg ) daily except 1/2 tablet on Mondays.  Recheck INR in 4 weeks.

## 2014-12-21 ENCOUNTER — Other Ambulatory Visit: Payer: Self-pay | Admitting: Family Medicine

## 2015-01-20 ENCOUNTER — Other Ambulatory Visit: Payer: Self-pay | Admitting: Family Medicine

## 2015-01-21 ENCOUNTER — Ambulatory Visit: Payer: 59

## 2015-01-27 NOTE — Addendum Note (Signed)
Addended by: Rudene Anda on: 01/27/2015 08:54 AM   Modules accepted: Miquel Dunn

## 2015-02-19 ENCOUNTER — Other Ambulatory Visit: Payer: Self-pay | Admitting: Family Medicine

## 2015-03-04 ENCOUNTER — Ambulatory Visit (INDEPENDENT_AMBULATORY_CARE_PROVIDER_SITE_OTHER): Payer: 59 | Admitting: *Deleted

## 2015-03-04 DIAGNOSIS — Z86718 Personal history of other venous thrombosis and embolism: Secondary | ICD-10-CM

## 2015-03-04 LAB — POCT INR: INR: 2.3

## 2015-03-04 NOTE — Progress Notes (Signed)
Pre visit review using our clinic review tool, if applicable. No additional management support is needed unless otherwise documented below in the visit note. 

## 2015-04-01 ENCOUNTER — Ambulatory Visit (INDEPENDENT_AMBULATORY_CARE_PROVIDER_SITE_OTHER): Payer: 59

## 2015-04-01 DIAGNOSIS — Z86718 Personal history of other venous thrombosis and embolism: Secondary | ICD-10-CM

## 2015-04-01 LAB — POCT INR: INR: 2

## 2015-04-01 NOTE — Addendum Note (Signed)
Addended by: Reino Bellis on: 04/01/2015 03:15 PM   Modules accepted: Level of Service

## 2015-04-01 NOTE — Patient Instructions (Signed)
Continue same dosing, 5mg  everyday except Monday 2.5mg /half tablet.  Recheck in 4 weeks. Appt: 04/29/2015 @ 9am

## 2015-04-29 ENCOUNTER — Ambulatory Visit (INDEPENDENT_AMBULATORY_CARE_PROVIDER_SITE_OTHER): Payer: 59

## 2015-04-29 DIAGNOSIS — I82409 Acute embolism and thrombosis of unspecified deep veins of unspecified lower extremity: Secondary | ICD-10-CM | POA: Diagnosis not present

## 2015-04-29 LAB — POCT INR: INR: 2.4

## 2015-05-27 ENCOUNTER — Ambulatory Visit (INDEPENDENT_AMBULATORY_CARE_PROVIDER_SITE_OTHER): Payer: 59 | Admitting: Family Medicine

## 2015-05-27 ENCOUNTER — Ambulatory Visit: Payer: 59

## 2015-05-27 ENCOUNTER — Encounter: Payer: Self-pay | Admitting: Family Medicine

## 2015-05-27 VITALS — BP 138/80 | HR 85 | Temp 98.5°F | Wt 334.0 lb

## 2015-05-27 DIAGNOSIS — R05 Cough: Secondary | ICD-10-CM

## 2015-05-27 DIAGNOSIS — J0100 Acute maxillary sinusitis, unspecified: Secondary | ICD-10-CM | POA: Diagnosis not present

## 2015-05-27 DIAGNOSIS — Z7901 Long term (current) use of anticoagulants: Secondary | ICD-10-CM | POA: Diagnosis not present

## 2015-05-27 DIAGNOSIS — R059 Cough, unspecified: Secondary | ICD-10-CM

## 2015-05-27 LAB — POCT INR: INR: 1.9

## 2015-05-27 MED ORDER — LEVOFLOXACIN 500 MG PO TABS: 500.0000 mg | ORAL_TABLET | Freq: Every day | ORAL | Status: DC

## 2015-05-27 MED ORDER — FLUTICASONE PROPIONATE 50 MCG/ACT NA SUSP: 2.0000 | Freq: Every day | NASAL | Status: DC

## 2015-05-27 MED ORDER — GUAIFENESIN-CODEINE 100-10 MG/5ML PO SYRP: 5.0000 mL | ORAL_SOLUTION | Freq: Three times a day (TID) | ORAL | Status: DC | PRN

## 2015-05-27 NOTE — Assessment & Plan Note (Signed)
Take extra 2.5 today and then take 5 mg daily Recheck next week

## 2015-05-27 NOTE — Progress Notes (Signed)
Patient ID: BANE CRUCES, male    DOB: [birth year]  Age: 59 y.o. MRN: [id number]    Subjective:  Subjective HPI MAUREEN HAAGEN presents for congestion and cough x 1 week, no fevers.  + sinus pressure and productive cough.  He went to UC over weekend and was given a z pack---he is no better.   He also needs a Pt/ INR checked.    Review of Systems  Constitutional: Positive for chills. Negative for fever.  HENT: Positive for congestion, postnasal drip, rhinorrhea and sinus pressure.   Respiratory: Positive for cough. Negative for chest tightness, shortness of breath and wheezing.   Cardiovascular: Negative for chest pain, palpitations and leg swelling.  Allergic/Immunologic: Negative for environmental allergies.    History Past Medical History  Diagnosis Date  . Hypertension   . DVT (deep venous thrombosis) ([place])   . Psychosexual dysfunction with inhibited sexual excitement   . Gout   . Influenza with pneumonia     He has past surgical history that includes Tonsillectomy and adenoidectomy and Hand surgery.   His family history includes Alcohol abuse in his father; Alzheimer's disease in his maternal grandmother, mother, and paternal aunt; Cancer in his sister; Esophageal cancer in his maternal uncle; Heart disease (age of onset: 45) in his brother; Hypertension in his father, maternal grandfather, maternal grandmother, mother, paternal grandfather, and paternal grandmother; Lung cancer in his maternal uncle.He reports that he has never smoked. He has never used smokeless tobacco. He reports that he drinks alcohol. He reports that he does not use illicit drugs.  Current Outpatient Prescriptions on File Prior to Visit  Medication Sig Dispense Refill  . albuterol (PROAIR HFA) 108 (90 BASE) MCG/ACT inhaler Inhale 2 puffs into the lungs every 6 (six) hours as needed for wheezing. 1 Inhaler 2  . ALPRAZolam (XANAX) 0.5 MG tablet Take 0.5 mg by mouth at bedtime as needed.    [name] amLODipine  (NORVASC) 5 MG tablet TAKE 1 TABLET BY MOUTH DAILY 30 tablet 5  . atorvastatin (LIPITOR) 10 MG tablet TAKE 1 TABLET BY MOUTH EVERY DAY 90 tablet 1  . cetirizine (ZYRTEC) 10 MG tablet Take 10 mg by mouth daily.    . fish oil-omega-3 fatty acids 1000 MG capsule Take 1 g by mouth daily.    . Flaxseed, Linseed, 1000 MG CAPS Take 1 capsule by mouth daily.    . tadalafil (CIALIS) 20 MG tablet Take 20 mg by mouth daily as needed.    . valsartan (DIOVAN) 160 MG tablet Take 1 tablet (160 mg total) by mouth 2 (two) times daily. 180 tablet 3  . warfarin (COUMADIN) 5 MG tablet TAKE 1 TABLET BY MOUTH EVERY DAY 90 tablet 0   No current facility-administered medications on file prior to visit.     Objective:  Objective Physical Exam  Constitutional: He is oriented to person, place, and time. He appears well-developed and well-nourished.  HENT:  Right Ear: Hearing, tympanic membrane, external ear and ear canal normal.  Left Ear: Hearing, tympanic membrane, external ear and ear canal normal.  Nose: Sinus tenderness present. Right sinus exhibits maxillary sinus tenderness and frontal sinus tenderness. Left sinus exhibits maxillary sinus tenderness and frontal sinus tenderness.  + PND + errythema  Eyes: Conjunctivae are normal. Right eye exhibits no discharge. Left eye exhibits no discharge.  Cardiovascular: Normal rate, regular rhythm and normal heart sounds.   No murmur heard. Pulmonary/Chest: Effort normal and breath sounds normal. No respiratory distress. He has no  wheezes. He has no rales. He exhibits no tenderness.  Musculoskeletal: He exhibits no edema.  Lymphadenopathy:    He has cervical adenopathy.  Neurological: He is alert and oriented to person, place, and time.   BP 138/80 mmHg  Pulse 85  Temp(Src) 98.5 F (36.9 C) (Oral)  Wt 334 lb (151.501 kg)  SpO2 98% Wt Readings from Last 3 Encounters:  05/27/15 334 lb (151.501 kg)  12/17/14 331 lb 3.2 oz (150.231 kg)  12/03/14 332 lb 9.6 oz  (150.866 kg)     Lab Results  Component Value Date   WBC 7.2 11/26/2014   HGB 14.7 11/26/2014   HCT 43.8 11/26/2014   PLT 227.0 11/26/2014   GLUCOSE 89 11/26/2014   CHOL 122 11/26/2014   TRIG 63.0 11/26/2014   HDL 35.70* 11/26/2014   LDLCALC 74 11/26/2014   ALT 15 11/26/2014   AST 15 11/26/2014   NA 138 11/26/2014   K 4.0 11/26/2014   CL 104 11/26/2014   CREATININE 1.02 11/26/2014   BUN 14 11/26/2014   CO2 30 11/26/2014   TSH 1.03 11/26/2014   PSA 0.58 11/26/2014   INR 1.9 05/27/2015   MICROALBUR 1.3 10/22/2012    No results found.   Assessment & Plan:  Plan I have discontinued Mr. Dunkley's azithromycin. I am also having him start on levofloxacin, fluticasone, and guaiFENesin-codeine. Additionally, I am having him maintain his fish oil-omega-3 fatty acids, tadalafil, Flaxseed (Linseed), ALPRAZolam, albuterol, cetirizine, valsartan, amLODipine, warfarin, atorvastatin, and benzonatate.  Meds ordered this encounter  Medications  . DISCONTD: azithromycin (ZITHROMAX) 250 MG tablet    Sig:     Refill:  0  . benzonatate (TESSALON) 100 MG capsule    Sig: TK 2 CS PO TID PRN    Refill:  0  . levofloxacin (LEVAQUIN) 500 MG tablet    Sig: Take 1 tablet (500 mg total) by mouth daily.    Dispense:  10 tablet    Refill:  0  . fluticasone (FLONASE) 50 MCG/ACT nasal spray    Sig: Place 2 sprays into both nostrils daily.    Dispense:  16 g    Refill:  6  . guaiFENesin-codeine (ROBITUSSIN AC) 100-10 MG/5ML syrup    Sig: Take 5 mLs by mouth 3 (three) times daily as needed for cough.    Dispense:  120 mL    Refill:  0    Problem List Items Addressed This Visit    Long term (current) use of anticoagulants - Primary    Take extra 2.5 today and then take 5 mg daily Recheck next week      Relevant Orders   POCT INR (Completed)    Other Visit Diagnoses    Acute maxillary sinusitis, recurrence not specified        Relevant Medications    benzonatate (TESSALON) 100 MG capsule      levofloxacin (LEVAQUIN) 500 MG tablet    fluticasone (FLONASE) 50 MCG/ACT nasal spray    guaiFENesin-codeine (ROBITUSSIN AC) 100-10 MG/5ML syrup    Cough        Relevant Medications    guaiFENesin-codeine (ROBITUSSIN AC) 100-10 MG/5ML syrup       Follow-up: Return in about 1 week (around 06/03/2015) for pt/ inr.  Garnet Koyanagi, DO

## 2015-05-27 NOTE — Progress Notes (Signed)
Pre visit review using our clinic review tool, if applicable. No additional management support is needed unless otherwise documented below in the visit note. 

## 2015-05-27 NOTE — Patient Instructions (Signed)

## 2015-06-03 ENCOUNTER — Ambulatory Visit (INDEPENDENT_AMBULATORY_CARE_PROVIDER_SITE_OTHER): Payer: 59

## 2015-06-03 DIAGNOSIS — Z7901 Long term (current) use of anticoagulants: Secondary | ICD-10-CM | POA: Diagnosis not present

## 2015-06-03 LAB — POCT INR: INR: 2.5

## 2015-06-03 NOTE — Patient Instructions (Signed)
Take 1 tablet (5mg ) daily except 1/2 tablet on Mondays.  Recheck INR in 4 weeks. Patient to return on 07/01/15

## 2015-06-03 NOTE — Progress Notes (Signed)
Pre visit review using our clinic review tool, if applicable. No additional management support is needed unless otherwise documented below in the visit note.   Patient in for INR check today. INR within Goal of 2.0-3.0 =2.5

## 2015-06-17 ENCOUNTER — Other Ambulatory Visit: Payer: Self-pay | Admitting: Family Medicine

## 2015-07-01 ENCOUNTER — Ambulatory Visit (INDEPENDENT_AMBULATORY_CARE_PROVIDER_SITE_OTHER): Payer: 59

## 2015-07-01 DIAGNOSIS — I82401 Acute embolism and thrombosis of unspecified deep veins of right lower extremity: Secondary | ICD-10-CM | POA: Diagnosis not present

## 2015-07-01 LAB — POCT INR: INR: 2.9

## 2015-07-01 NOTE — Patient Instructions (Signed)
Per Dr. Etter Sjogren continue Warfarin 5 mg daily and return for INR check in 4 weeks

## 2015-07-01 NOTE — Progress Notes (Signed)
Pre visit review using our clinic review tool, if applicable. No additional management support is needed unless otherwise documented below in the visit note.  Patient iin for INR check. INR = 2.9 which is within Goal of 2.0-3.0

## 2015-07-29 ENCOUNTER — Ambulatory Visit (INDEPENDENT_AMBULATORY_CARE_PROVIDER_SITE_OTHER): Payer: Commercial Managed Care - HMO | Admitting: Behavioral Health

## 2015-07-29 DIAGNOSIS — Z23 Encounter for immunization: Secondary | ICD-10-CM | POA: Diagnosis not present

## 2015-07-29 DIAGNOSIS — Z86718 Personal history of other venous thrombosis and embolism: Secondary | ICD-10-CM | POA: Diagnosis not present

## 2015-07-29 LAB — POCT INR: INR: 2.6

## 2015-07-29 NOTE — Progress Notes (Signed)
Pre visit review using our clinic review tool, if applicable. No additional management support is needed unless otherwise documented below in the visit note. 

## 2015-07-29 NOTE — Patient Instructions (Signed)
Per Dr. Etter Sjogren: Continue taking Warfarin 5 mg daily and return for INR check in 4 weeks.

## 2015-08-17 ENCOUNTER — Other Ambulatory Visit: Payer: Self-pay | Admitting: Family Medicine

## 2015-08-26 ENCOUNTER — Ambulatory Visit (INDEPENDENT_AMBULATORY_CARE_PROVIDER_SITE_OTHER): Payer: Commercial Managed Care - HMO | Admitting: *Deleted

## 2015-08-26 DIAGNOSIS — Z86718 Personal history of other venous thrombosis and embolism: Secondary | ICD-10-CM

## 2015-08-26 LAB — POCT INR: INR: 2.8

## 2015-08-26 NOTE — Progress Notes (Signed)
Pre visit review using our clinic review tool, if applicable. No additional management support is needed unless otherwise documented below in the visit note.  INR today 2.8.  Continue taking Warfarin 5 mg daily and return for INR check in 4 weeks.  Pt aware of instructions and verbalized understanding.  Dorrene German, RN

## 2015-08-26 NOTE — Patient Instructions (Signed)
Continue taking Warfarin 5 mg daily and return for INR check in 4 weeks. 

## 2015-09-19 ENCOUNTER — Other Ambulatory Visit: Payer: Self-pay | Admitting: Family Medicine

## 2015-09-23 ENCOUNTER — Ambulatory Visit: Payer: Commercial Managed Care - HMO

## 2015-09-30 ENCOUNTER — Ambulatory Visit (INDEPENDENT_AMBULATORY_CARE_PROVIDER_SITE_OTHER): Payer: Commercial Managed Care - HMO | Admitting: *Deleted

## 2015-09-30 DIAGNOSIS — Z86718 Personal history of other venous thrombosis and embolism: Secondary | ICD-10-CM

## 2015-09-30 LAB — POCT INR: INR: 2.4

## 2015-09-30 NOTE — Patient Instructions (Signed)
Continue taking Warfarin 5 mg daily and return for INR check in 4 weeks. 

## 2015-09-30 NOTE — Progress Notes (Signed)
Pre visit review using our clinic review tool, if applicable. No additional management support is needed unless otherwise documented below in the visit note.  Continue taking Warfarin 5 mg daily and return for INR check in 4 weeks.  Pt aware of instructions and verbalized understanding.   Next appt: 10/28/15  Dorrene German, RN

## 2015-10-28 ENCOUNTER — Ambulatory Visit: Payer: Commercial Managed Care - HMO

## 2015-11-02 ENCOUNTER — Ambulatory Visit (INDEPENDENT_AMBULATORY_CARE_PROVIDER_SITE_OTHER): Payer: Commercial Managed Care - HMO | Admitting: *Deleted

## 2015-11-02 VITALS — BP 134/83

## 2015-11-02 DIAGNOSIS — Z86718 Personal history of other venous thrombosis and embolism: Secondary | ICD-10-CM | POA: Diagnosis not present

## 2015-11-02 LAB — POCT INR: INR: 2.4

## 2015-11-02 NOTE — Patient Instructions (Signed)
Continue taking Warfarin 5 mg daily and return for INR check in 4 weeks. 

## 2015-11-02 NOTE — Progress Notes (Signed)
Pre visit review using our clinic review tool, if applicable. No additional management support is needed unless otherwise documented below in the visit note.  INR today 2.4.   Continue taking Warfarin 5 mg daily and return for INR check in 4 weeks.  Pt verbalized understanding of instructions. Pt requested BP check, ok per Dr. Etter Sjogren. BP 134/83, no changes to medications.  Next appt: 11/30/15  Dorrene German, RN

## 2015-11-17 ENCOUNTER — Other Ambulatory Visit: Payer: Self-pay | Admitting: Family Medicine

## 2015-11-30 ENCOUNTER — Ambulatory Visit (INDEPENDENT_AMBULATORY_CARE_PROVIDER_SITE_OTHER): Payer: Commercial Managed Care - HMO | Admitting: *Deleted

## 2015-11-30 DIAGNOSIS — Z86718 Personal history of other venous thrombosis and embolism: Secondary | ICD-10-CM | POA: Diagnosis not present

## 2015-11-30 LAB — POCT INR: INR: 2.6

## 2015-11-30 NOTE — Patient Instructions (Signed)
Continue taking Warfarin 5 mg daily and return for INR check in 6 weeks. 

## 2015-11-30 NOTE — Progress Notes (Signed)
Pre visit review using our clinic review tool, if applicable. No additional management support is needed unless otherwise documented below in the visit note.  INR today 2.6.   Continue taking Warfarin 5 mg daily and return for INR check in 6 weeks.  Next appt: 01/04/16  Dorrene German, RN

## 2015-12-17 ENCOUNTER — Other Ambulatory Visit: Payer: Self-pay | Admitting: Family Medicine

## 2016-01-04 ENCOUNTER — Ambulatory Visit (INDEPENDENT_AMBULATORY_CARE_PROVIDER_SITE_OTHER): Payer: Commercial Managed Care - HMO | Admitting: *Deleted

## 2016-01-04 DIAGNOSIS — Z86718 Personal history of other venous thrombosis and embolism: Secondary | ICD-10-CM | POA: Diagnosis not present

## 2016-01-04 LAB — POCT INR: INR: 2.7

## 2016-01-04 NOTE — Progress Notes (Signed)
Pre visit review using our clinic review tool, if applicable. No additional management support is needed unless otherwise documented below in the visit note.  INR today 2.7.  Continue taking Warfarin 5 mg daily and return for INR check in 6 weeks.  Dorrene German, RN

## 2016-01-04 NOTE — Patient Instructions (Signed)
Continue taking Warfarin 5 mg daily and return for INR check in 6 weeks. 

## 2016-01-15 ENCOUNTER — Other Ambulatory Visit: Payer: Self-pay | Admitting: Family Medicine

## 2016-02-15 ENCOUNTER — Other Ambulatory Visit: Payer: Self-pay | Admitting: Family Medicine

## 2016-02-15 ENCOUNTER — Ambulatory Visit (INDEPENDENT_AMBULATORY_CARE_PROVIDER_SITE_OTHER): Payer: Commercial Managed Care - HMO | Admitting: *Deleted

## 2016-02-15 DIAGNOSIS — Z86718 Personal history of other venous thrombosis and embolism: Secondary | ICD-10-CM

## 2016-02-15 LAB — POCT INR: INR: 2.6

## 2016-02-15 NOTE — Progress Notes (Signed)
Pre visit review using our clinic review tool, if applicable. No additional management support is needed unless otherwise documented below in the visit note.  INR today 2.6.   Next appointment: 03/28/16  Continue taking Warfarin 5 mg daily and return for INR check in 6 weeks.  Dorrene German, RN

## 2016-02-18 ENCOUNTER — Encounter: Payer: Commercial Managed Care - HMO | Admitting: Family Medicine

## 2016-03-14 ENCOUNTER — Other Ambulatory Visit: Payer: Self-pay | Admitting: Family Medicine

## 2016-03-28 ENCOUNTER — Ambulatory Visit (INDEPENDENT_AMBULATORY_CARE_PROVIDER_SITE_OTHER): Payer: Commercial Managed Care - HMO | Admitting: Behavioral Health

## 2016-03-28 DIAGNOSIS — Z86718 Personal history of other venous thrombosis and embolism: Secondary | ICD-10-CM

## 2016-03-28 LAB — POCT INR: INR: 2.1

## 2016-03-28 NOTE — Patient Instructions (Signed)
Continue taking Warfarin 5 mg daily and return for INR check in 6 weeks. 

## 2016-03-28 NOTE — Progress Notes (Signed)
Pre visit review using our clinic review tool, if applicable. No additional management support is needed unless otherwise documented below in the visit note.  Patient in clinic for INR check. Reviewed medications with the patient. He did not report any positive findings. Today's reading was 2.1.  Informed patient to continue taking Warfarin 5 mg daily and return for INR check in 6 weeks. He verbalized understanding and did not have any concerns prior to leaving nurse visit.  Next appointment scheduled for 05/09/16 at 9:00 AM.

## 2016-04-17 ENCOUNTER — Other Ambulatory Visit: Payer: Self-pay | Admitting: Family Medicine

## 2016-05-04 ENCOUNTER — Encounter: Payer: Commercial Managed Care - HMO | Admitting: Family Medicine

## 2016-05-09 ENCOUNTER — Ambulatory Visit (INDEPENDENT_AMBULATORY_CARE_PROVIDER_SITE_OTHER): Payer: Commercial Managed Care - HMO | Admitting: *Deleted

## 2016-05-09 DIAGNOSIS — Z86718 Personal history of other venous thrombosis and embolism: Secondary | ICD-10-CM

## 2016-05-09 LAB — POCT INR: INR: 2.2

## 2016-05-09 NOTE — Progress Notes (Signed)
Pre visit review using our clinic review tool, if applicable. No additional management support is needed unless otherwise documented below in the visit note.  Next appointment: 06/20/16  Dorrene German, RN

## 2016-05-19 ENCOUNTER — Other Ambulatory Visit: Payer: Self-pay | Admitting: Family Medicine

## 2016-05-19 NOTE — Telephone Encounter (Signed)
Pt is scheduled for CPE on 06/29/16. I have refilled Rx for Atorvastatin #90 tablet 0 refills until evaluated. TL/CMA

## 2016-06-09 ENCOUNTER — Other Ambulatory Visit: Payer: Self-pay | Admitting: Family Medicine

## 2016-06-13 NOTE — Telephone Encounter (Signed)
Medication filled to pharmacy as requested.   

## 2016-06-19 ENCOUNTER — Other Ambulatory Visit: Payer: Self-pay | Admitting: Family Medicine

## 2016-06-19 NOTE — Telephone Encounter (Signed)
eScribe request from [facility] for refill on Atorvastatin Last AEX - 11/29/14 [Acute 05/27/15] Next AEX - 06/29/16 [Canceled 05/04/16 appointment] Requested drug refills are authorized 15-day Only, however, the patient needs further evaluation and/or laboratory testing before further refills are given. Ask him to make an appointment for this/SLS 12/04

## 2016-06-20 ENCOUNTER — Ambulatory Visit (INDEPENDENT_AMBULATORY_CARE_PROVIDER_SITE_OTHER): Payer: Commercial Managed Care - HMO | Admitting: Behavioral Health

## 2016-06-20 DIAGNOSIS — Z86718 Personal history of other venous thrombosis and embolism: Secondary | ICD-10-CM

## 2016-06-20 LAB — POCT INR: INR: 2.3

## 2016-06-20 NOTE — Patient Instructions (Signed)
Continue taking Warfarin 5 mg daily and return for INR check in 6 weeks. 

## 2016-06-20 NOTE — Progress Notes (Signed)
Pre visit review using our clinic review tool, if applicable. No additional management support is needed unless otherwise documented below in the visit note.  Patient came in clinic for INR check. Reviewed medication & current regimen with the patient. He did not report any changes or positive findings. Today's INR reading was 2.3.  Advised patient to continue taking Warfarin 5 mg daily and return for INR check in 6 weeks. He voiced understanding.  Next appointment scheduled for 08/01/16 at 9:00 AM.

## 2016-06-29 ENCOUNTER — Ambulatory Visit (INDEPENDENT_AMBULATORY_CARE_PROVIDER_SITE_OTHER): Payer: Commercial Managed Care - HMO | Admitting: Family Medicine

## 2016-06-29 ENCOUNTER — Encounter: Payer: Self-pay | Admitting: Family Medicine

## 2016-06-29 VITALS — BP 128/76 | HR 79 | Temp 98.1°F | Resp 17 | Ht 73.0 in | Wt 341.6 lb

## 2016-06-29 DIAGNOSIS — I10 Essential (primary) hypertension: Secondary | ICD-10-CM

## 2016-06-29 DIAGNOSIS — Z0000 Encounter for general adult medical examination without abnormal findings: Secondary | ICD-10-CM

## 2016-06-29 DIAGNOSIS — E785 Hyperlipidemia, unspecified: Secondary | ICD-10-CM

## 2016-06-29 DIAGNOSIS — Z1159 Encounter for screening for other viral diseases: Secondary | ICD-10-CM

## 2016-06-29 DIAGNOSIS — I82401 Acute embolism and thrombosis of unspecified deep veins of right lower extremity: Secondary | ICD-10-CM

## 2016-06-29 DIAGNOSIS — J4521 Mild intermittent asthma with (acute) exacerbation: Secondary | ICD-10-CM

## 2016-06-29 DIAGNOSIS — Z23 Encounter for immunization: Secondary | ICD-10-CM

## 2016-06-29 LAB — PROTIME-INR
INR: 2.3 — ABNORMAL HIGH
Prothrombin Time: 23.5 s — ABNORMAL HIGH (ref 9.0–11.5)

## 2016-06-29 MED ORDER — ALBUTEROL SULFATE HFA 108 (90 BASE) MCG/ACT IN AERS: 2.0000 | INHALATION_SPRAY | Freq: Four times a day (QID) | RESPIRATORY_TRACT | 2 refills | Status: DC | PRN

## 2016-06-29 MED ORDER — ATORVASTATIN CALCIUM 10 MG PO TABS: 10.0000 mg | ORAL_TABLET | Freq: Every day | ORAL | 1 refills | Status: DC

## 2016-06-29 MED ORDER — WARFARIN SODIUM 5 MG PO TABS: 5.0000 mg | ORAL_TABLET | Freq: Every day | ORAL | 1 refills | Status: DC

## 2016-06-29 MED ORDER — VALSARTAN 160 MG PO TABS: 160.0000 mg | ORAL_TABLET | Freq: Two times a day (BID) | ORAL | 1 refills | Status: DC

## 2016-06-29 MED ORDER — AMLODIPINE BESYLATE 5 MG PO TABS: 5.0000 mg | ORAL_TABLET | Freq: Every day | ORAL | 1 refills | Status: DC

## 2016-06-29 NOTE — Progress Notes (Signed)
Pre visit review using our clinic review tool, if applicable. No additional management support is needed unless otherwise documented below in the visit note. 

## 2016-06-29 NOTE — Progress Notes (Addendum)
Patient ID: Daniel Powers, male    DOB: [birth year]  Age: 60 y.o. MRN: [id number]    Subjective:  Subjective  HPI Daniel Powers presents for cpe  Review of Systems  Constitutional: Negative.   HENT: Negative for congestion, ear pain, hearing loss, nosebleeds, postnasal drip, rhinorrhea, sinus pressure, sneezing and tinnitus.   Eyes: Negative for photophobia, discharge, itching and visual disturbance.  Respiratory: Negative.   Cardiovascular: Negative.   Gastrointestinal: Negative for abdominal distention, abdominal pain, anal bleeding, blood in stool and constipation.  Endocrine: Negative.   Genitourinary: Negative.   Musculoskeletal: Negative.   Skin: Negative.   Allergic/Immunologic: Negative.   Neurological: Negative for dizziness, weakness, light-headedness, numbness and headaches.  Psychiatric/Behavioral: Negative for agitation, confusion, decreased concentration, dysphoric mood, sleep disturbance and suicidal ideas. The patient is not nervous/anxious.     History Past Medical History:  Diagnosis Date  . DVT (deep venous thrombosis) ([place])   . Gout   . Hypertension   . Influenza with pneumonia   . Psychosexual dysfunction with inhibited sexual excitement     He has a past surgical history that includes Tonsillectomy and adenoidectomy and Hand surgery.   His family history includes Alcohol abuse in his father; Alzheimer's disease in his maternal grandmother, mother, and paternal aunt; Cancer in his sister; Esophageal cancer in his maternal uncle; Heart disease (age of onset: 30) in his brother; Hypertension in his father, maternal grandfather, maternal grandmother, mother, paternal grandfather, and paternal grandmother; Lung cancer in his maternal uncle.He reports that he has never smoked. He has never used smokeless tobacco. He reports that he drinks alcohol. He reports that he does not use drugs.  Current Outpatient Prescriptions on File Prior to Visit  Medication Sig  Dispense Refill  . ALPRAZolam (XANAX) 0.5 MG tablet Take 0.5 mg by mouth at bedtime as needed.    . benzonatate (TESSALON) 100 MG capsule TK 2 CS PO TID PRN  0  . cetirizine (ZYRTEC) 10 MG tablet Take 10 mg by mouth daily.    . fish oil-omega-3 fatty acids 1000 MG capsule Take 1 g by mouth daily.    . Flaxseed, Linseed, 1000 MG CAPS Take 1 capsule by mouth daily.    . fluticasone (FLONASE) 50 MCG/ACT nasal spray Place 2 sprays into both nostrils daily. 16 g 6  . guaiFENesin-codeine (ROBITUSSIN AC) 100-10 MG/5ML syrup Take 5 mLs by mouth 3 (three) times daily as needed for cough. 120 mL 0  . tadalafil (CIALIS) 20 MG tablet Take 20 mg by mouth daily as needed.     No current facility-administered medications on file prior to visit.      Objective:  Objective  Physical Exam  Constitutional: He is oriented to person, place, and time. Vital signs are normal. He appears well-developed and well-nourished. He is sleeping.  HENT:  Head: Normocephalic and atraumatic.  Mouth/Throat: Oropharynx is clear and moist.  Eyes: EOM are normal. Pupils are equal, round, and reactive to light.  Neck: Normal range of motion. Neck supple. No thyromegaly present.  Cardiovascular: Normal rate and regular rhythm.   No murmur heard. Pulmonary/Chest: Effort normal and breath sounds normal. No respiratory distress. He has no wheezes. He has no rales. He exhibits no tenderness.  Musculoskeletal: He exhibits no edema or tenderness.  Neurological: He is alert and oriented to person, place, and time.  Skin: Skin is warm and dry.  Psychiatric: He has a normal mood and affect. His behavior is normal. Judgment and thought content  normal.  Nursing note and vitals reviewed.  BP 128/76 (BP Location: Right Arm, Patient Position: Sitting, Cuff Size: Large)   Pulse 79   Temp 98.1 F (36.7 C) (Oral)   Resp 17   Ht 6\' 1"  (1.854 m)   Wt (!) 341 lb 9.6 oz (154.9 kg)   SpO2 98%   BMI 45.07 kg/m  Wt Readings from Last 3  Encounters:  06/29/16 (!) 341 lb 9.6 oz (154.9 kg)  05/27/15 (!) 334 lb (151.5 kg)  12/17/14 (!) 331 lb 3.2 oz (150.2 kg)     Lab Results  Component Value Date   WBC 8.9 06/29/2016   HGB 14.7 06/29/2016   HCT 44.7 06/29/2016   PLT 223.0 06/29/2016   GLUCOSE 83 06/29/2016   CHOL 125 06/29/2016   TRIG 111.0 06/29/2016   HDL 35.20 (L) 06/29/2016   LDLCALC 67 06/29/2016   ALT 16 06/29/2016   AST 18 06/29/2016   NA 142 06/29/2016   K 4.0 06/29/2016   CL 107 06/29/2016   CREATININE 1.02 06/29/2016   BUN 17 06/29/2016   CO2 23 06/29/2016   TSH 1.04 06/29/2016   PSA 0.83 06/29/2016   INR 2.6 08/01/2016   MICROALBUR 1.3 10/22/2012    No results found.   Assessment & Plan:  Plan  I have discontinued Mr. Dobler's levofloxacin. I have also changed his atorvastatin, amLODipine, valsartan, and warfarin. Additionally, I am having him maintain his fish oil-omega-3 fatty acids, tadalafil, Flaxseed (Linseed), ALPRAZolam, cetirizine, benzonatate, fluticasone, guaiFENesin-codeine, and albuterol.  Meds ordered this encounter  Medications  . atorvastatin (LIPITOR) 10 MG tablet    Sig: Take 1 tablet (10 mg total) by mouth daily.    Dispense:  90 tablet    Refill:  1  . amLODipine (NORVASC) 5 MG tablet    Sig: Take 1 tablet (5 mg total) by mouth daily.    Dispense:  90 tablet    Refill:  1  . valsartan (DIOVAN) 160 MG tablet    Sig: Take 1 tablet (160 mg total) by mouth 2 (two) times daily.    Dispense:  180 tablet    Refill:  1  . warfarin (COUMADIN) 5 MG tablet    Sig: Take 1 tablet (5 mg total) by mouth daily.    Dispense:  90 tablet    Refill:  1  . albuterol (PROAIR HFA) 108 (90 Base) MCG/ACT inhaler    Sig: Inhale 2 puffs into the lungs every 6 (six) hours as needed for wheezing.    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)    Pt is on coumadin----started 2011      Relevant Medications   atorvastatin (LIPITOR) 10 MG tablet   amLODipine (NORVASC) 5 MG tablet   valsartan (DIOVAN) 160 MG tablet   warfarin (COUMADIN) 5 MG tablet   Other Relevant Orders   INR/PT (Completed)    Other Visit Diagnoses    Mild intermittent asthma with exacerbation       Relevant Medications   albuterol (PROAIR HFA) 108 (90 Base) MCG/ACT inhaler   Essential hypertension  Relevant Medications   atorvastatin (LIPITOR) 10 MG tablet   amLODipine (NORVASC) 5 MG tablet   valsartan (DIOVAN) 160 MG tablet   warfarin (COUMADIN) 5 MG tablet   Other Relevant Orders   PSA (Completed)   TSH (Completed)   Lipid panel (Completed)   CBC with Differential/Platelet (Completed)   Comprehensive metabolic panel (Completed)   Hyperlipidemia, unspecified hyperlipidemia type       Relevant Medications   atorvastatin (LIPITOR) 10 MG tablet   amLODipine (NORVASC) 5 MG tablet   valsartan (DIOVAN) 160 MG tablet   warfarin (COUMADIN) 5 MG tablet   Other Relevant Orders   PSA (Completed)   TSH (Completed)   Lipid panel (Completed)   CBC with Differential/Platelet (Completed)   Comprehensive metabolic panel (Completed)   Need for hepatitis C screening test       Relevant Orders   Hepatitis C antibody (Completed)   Encounter for immunization       Relevant Orders   Flu Vaccine QUAD 36+ mos IM (Completed)      Follow-up: Return in about 6 months (around 12/28/2016) for hypertension, hyperlipidemia.  Ann Held, DO

## 2016-06-29 NOTE — Patient Instructions (Signed)
Preventive Care 40-64 Years, Male Preventive care refers to lifestyle choices and visits with your health care provider that can promote health and wellness. What does preventive care include?  A yearly physical exam. This is also called an annual well check.  Dental exams once or twice a year.  Routine eye exams. Ask your health care provider how often you should have your eyes checked.  Personal lifestyle choices, including:  Daily care of your teeth and gums.  Regular physical activity.  Eating a healthy diet.  Avoiding tobacco and drug use.  Limiting alcohol use.  Practicing safe sex.  Taking low-dose aspirin every day starting at age 50. What happens during an annual well check? The services and screenings done by your health care provider during your annual well check will depend on your age, overall health, lifestyle risk factors, and family history of disease. Counseling  Your health care provider may ask you questions about your:  Alcohol use.  Tobacco use.  Drug use.  Emotional well-being.  Home and relationship well-being.  Sexual activity.  Eating habits.  Work and work environment. Screening  You may have the following tests or measurements:  Height, weight, and BMI.  Blood pressure.  Lipid and cholesterol levels. These may be checked every 5 years, or more frequently if you are over 50 years old.  Skin check.  Lung cancer screening. You may have this screening every year starting at age 55 if you have a 30-pack-year history of smoking and currently smoke or have quit within the past 15 years.  Fecal occult blood test (FOBT) of the stool. You may have this test every year starting at age 50.  Flexible sigmoidoscopy or colonoscopy. You may have a sigmoidoscopy every 5 years or a colonoscopy every 10 years starting at age 50.  Prostate cancer screening. Recommendations will vary depending on your family history and other risks.  Hepatitis C  blood test.  Hepatitis B blood test.  Sexually transmitted disease (STD) testing.  Diabetes screening. This is done by checking your blood sugar (glucose) after you have not eaten for a while (fasting). You may have this done every 1-3 years. Discuss your test results, treatment options, and if necessary, the need for more tests with your health care provider. Vaccines  Your health care provider may recommend certain vaccines, such as:  Influenza vaccine. This is recommended every year.  Tetanus, diphtheria, and acellular pertussis (Tdap, Td) vaccine. You may need a Td booster every 10 years.  Varicella vaccine. You may need this if you have not been vaccinated.  Zoster vaccine. You may need this after age 60.  Measles, mumps, and rubella (MMR) vaccine. You may need at least one dose of MMR if you were born in 1957 or later. You may also need a second dose.  Pneumococcal 13-valent conjugate (PCV13) vaccine. You may need this if you have certain conditions and have not been vaccinated.  Pneumococcal polysaccharide (PPSV23) vaccine. You may need one or two doses if you smoke cigarettes or if you have certain conditions.  Meningococcal vaccine. You may need this if you have certain conditions.  Hepatitis A vaccine. You may need this if you have certain conditions or if you travel or work in places where you may be exposed to hepatitis A.  Hepatitis B vaccine. You may need this if you have certain conditions or if you travel or work in places where you may be exposed to hepatitis B.  Haemophilus influenzae type b (Hib)   vaccine. You may need this if you have certain risk factors. Talk to your health care provider about which screenings and vaccines you need and how often you need them. This information is not intended to replace advice given to you by your health care provider. Make sure you discuss any questions you have with your health care provider. Document Released: 07/30/2015  Document Revised: 03/22/2016 Document Reviewed: 05/04/2015 Elsevier Interactive Patient Education  2017 [employer].

## 2016-06-30 ENCOUNTER — Telehealth: Payer: Self-pay | Admitting: Internal Medicine

## 2016-06-30 LAB — COMPREHENSIVE METABOLIC PANEL
ALK PHOS: 50 U/L (ref 39–117)
ALT: 16 U/L (ref 0–53)
AST: 18 U/L (ref 0–37)
Albumin: 4 g/dL (ref 3.5–5.2)
BILIRUBIN TOTAL: 0.6 mg/dL (ref 0.2–1.2)
BUN: 17 mg/dL (ref 6–23)
CO2: 23 meq/L (ref 19–32)
Calcium: 9.1 mg/dL (ref 8.4–10.5)
Chloride: 107 mEq/L (ref 96–112)
Creatinine, Ser: 1.02 mg/dL (ref 0.40–1.50)
GFR: 95.22 mL/min (ref >60.00)
GLUCOSE: 83 mg/dL (ref 70–99)
Potassium: 4 mEq/L (ref 3.5–5.1)
SODIUM: 142 meq/L (ref 135–145)
TOTAL PROTEIN: 6.9 g/dL (ref 6.0–8.3)

## 2016-06-30 LAB — CBC WITH DIFFERENTIAL/PLATELET
Basophils Absolute: 0 10*3/uL (ref 0.0–0.1)
Basophils Relative: 0.5 % (ref 0.0–3.0)
Eosinophils Absolute: 0.2 10*3/uL (ref 0.0–0.7)
Eosinophils Relative: 1.7 % (ref 0.0–5.0)
HCT: 44.7 % (ref 39.0–52.0)
Hemoglobin: 14.7 g/dL (ref 13.0–17.0)
Lymphocytes Relative: 33 % (ref 12.0–46.0)
Lymphs Abs: 2.9 10*3/uL (ref 0.7–4.0)
MCHC: 32.8 g/dL (ref 30.0–36.0)
MCV: 83.3 fl (ref 78.0–100.0)
Monocytes Absolute: 0.5 10*3/uL (ref 0.1–1.0)
Monocytes Relative: 5.9 % (ref 3.0–12.0)
Neutro Abs: 5.2 10*3/uL (ref 1.4–7.7)
Neutrophils Relative %: 58.9 % (ref 43.0–77.0)
Platelets: 223 10*3/uL (ref 150.0–400.0)
RBC: 5.36 Mil/uL (ref 4.22–5.81)
RDW: 15.1 % (ref 11.5–15.5)
WBC: 8.9 10*3/uL (ref 4.0–10.5)

## 2016-06-30 LAB — HEPATITIS C ANTIBODY: HCV Ab: NEGATIVE

## 2016-06-30 LAB — LIPID PANEL
CHOL/HDL RATIO: 4
Cholesterol: 125 mg/dL (ref 0–200)
HDL: 35.2 mg/dL — ABNORMAL LOW (ref >39.00)
LDL Cholesterol: 67 mg/dL (ref 0–99)
NONHDL: 89.47
TRIGLYCERIDES: 111 mg/dL (ref 0.0–149.0)
VLDL: 22.2 mg/dL (ref 0.0–40.0)

## 2016-06-30 LAB — PSA: PSA: 0.83 ng/mL (ref 0.10–4.00)

## 2016-06-30 LAB — TSH: TSH: 1.04 u[IU]/mL (ref 0.35–4.50)

## 2016-06-30 NOTE — Telephone Encounter (Signed)
Sorry they bothered you with that-- we knew the results before we left last night and had told pt

## 2016-06-30 NOTE — Telephone Encounter (Signed)
FYI.  On call nurse called with lab result.  Pt 23.5 and INR 2.3.  Pt notified of INR results.  Explained you would notify him of remainder of lab results and would schedule f/u pt/inr.

## 2016-06-30 NOTE — Telephone Encounter (Signed)
No problem.  I just wanted to make sure the f/u pt/inr was scheduled.

## 2016-07-12 ENCOUNTER — Other Ambulatory Visit: Payer: Self-pay | Admitting: Family Medicine

## 2016-08-01 ENCOUNTER — Ambulatory Visit (INDEPENDENT_AMBULATORY_CARE_PROVIDER_SITE_OTHER): Payer: Commercial Managed Care - HMO | Admitting: Behavioral Health

## 2016-08-01 DIAGNOSIS — Z86718 Personal history of other venous thrombosis and embolism: Secondary | ICD-10-CM | POA: Diagnosis not present

## 2016-08-01 LAB — POCT INR: INR: 2.6

## 2016-08-01 NOTE — Patient Instructions (Signed)
Continue taking Warfarin 5 mg daily and return for INR check in 6 weeks. 

## 2016-08-01 NOTE — Progress Notes (Signed)
Pre visit review using our clinic review tool, if applicable. No additional management support is needed unless otherwise documented below in the visit note.  Patient presents in clinic for INR check. Verified medication & current regimen. All patient findings were negative. Today's INR reading was 2.6.  Advised patient to continue taking Warfarin 5 mg daily and return for INR check in 6 weeks.  He verbalized understanding. Next appointment scheduled for 09/12/16 at 9:00 AM.

## 2016-08-18 DIAGNOSIS — I82401 Acute embolism and thrombosis of unspecified deep veins of right lower extremity: Secondary | ICD-10-CM | POA: Insufficient documentation

## 2016-08-18 NOTE — Assessment & Plan Note (Signed)
Pt is on coumadin----started 2011

## 2016-08-22 ENCOUNTER — Encounter: Payer: Self-pay | Admitting: Family Medicine

## 2016-08-22 ENCOUNTER — Ambulatory Visit (INDEPENDENT_AMBULATORY_CARE_PROVIDER_SITE_OTHER): Payer: Commercial Managed Care - HMO | Admitting: Family Medicine

## 2016-08-22 VITALS — BP 130/80 | HR 89 | Temp 98.3°F | Resp 16 | Ht 73.0 in | Wt 341.6 lb

## 2016-08-22 DIAGNOSIS — L918 Other hypertrophic disorders of the skin: Secondary | ICD-10-CM | POA: Diagnosis not present

## 2016-08-22 DIAGNOSIS — I82401 Acute embolism and thrombosis of unspecified deep veins of right lower extremity: Secondary | ICD-10-CM

## 2016-08-22 NOTE — Patient Instructions (Signed)
Keep area dry 24 h then you may shower like usual

## 2016-08-22 NOTE — Progress Notes (Signed)
Subjective:  I acted as a Education administrator for Dr. Carollee Herter.  Daniel Powers, Daniel Powers   Patient ID: Daniel Powers, male    DOB: [birth year], 61 y.o.   MRN: [id number]  Chief Complaint  Patient presents with  . skin tag removal    under right arm pit    HPI Patient is in today for skin tag removal.  Skin tag is located under right arm pit.  Past Medical History:  Diagnosis Date  . DVT (deep venous thrombosis) ([place])   . Gout   . Hypertension   . Influenza with pneumonia   . Psychosexual dysfunction with inhibited sexual excitement     Past Surgical History:  Procedure Laterality Date  . HAND SURGERY     right  . TONSILLECTOMY AND ADENOIDECTOMY      Family History  Problem Relation Age of Onset  . Hypertension Mother   . Alzheimer's disease Mother   . Hypertension Father   . Alcohol abuse Father   . Cancer Sister     "male organs"  . Lung cancer Maternal Uncle   . Esophageal cancer Maternal Uncle   . Alzheimer's disease Paternal Aunt   . Hypertension Maternal Grandmother   . Alzheimer's disease Maternal Grandmother   . Hypertension Maternal Grandfather   . Hypertension Paternal Grandmother   . Hypertension Paternal Grandfather   . Heart disease Brother 36    MI    Social History   Social History  . Marital status: Married    Spouse name: [name]  . Number of children: 1  . Years of education: N/A   Occupational History  . attorney at law--  w/s Self   Social History Main Topics  . Smoking status: Never Smoker  . Smokeless tobacco: Never Used  . Alcohol use Yes     Comment: ocass- once or twice a month  . Drug use: No  . Sexual activity: Yes    Partners: Female   Other Topics Concern  . Not on file   Social History Narrative   Exercise --a little-- walking     Outpatient Medications Prior to Visit  Medication Sig Dispense Refill  . albuterol (PROAIR HFA) 108 (90 Base) MCG/ACT inhaler Inhale 2 puffs into the lungs every 6 (six) hours as needed for wheezing. 1  Inhaler 2  . ALPRAZolam (XANAX) 0.5 MG tablet Take 0.5 mg by mouth at bedtime as needed.    [name] amLODipine (NORVASC) 5 MG tablet Take 1 tablet (5 mg total) by mouth daily. 90 tablet 1  . atorvastatin (LIPITOR) 10 MG tablet Take 1 tablet (10 mg total) by mouth daily. 90 tablet 1  . cetirizine (ZYRTEC) 10 MG tablet Take 10 mg by mouth daily.    . fish oil-omega-3 fatty acids 1000 MG capsule Take 1 g by mouth daily.    . Flaxseed, Linseed, 1000 MG CAPS Take 1 capsule by mouth daily.    . fluticasone (FLONASE) 50 MCG/ACT nasal spray Place 2 sprays into both nostrils daily. 16 g 6  . tadalafil (CIALIS) 20 MG tablet Take 20 mg by mouth daily as needed.    . valsartan (DIOVAN) 160 MG tablet Take 1 tablet (160 mg total) by mouth 2 (two) times daily. 180 tablet 1  . warfarin (COUMADIN) 5 MG tablet TAKE 1 TABLET BY MOUTH DAILY 90 tablet 0  . benzonatate (TESSALON) 100 MG capsule TK 2 CS PO TID PRN  0  . guaiFENesin-codeine (ROBITUSSIN AC) 100-10 MG/5ML syrup Take 5 mLs  by mouth 3 (three) times daily as needed for cough. 120 mL 0  . valsartan (DIOVAN) 160 MG tablet TAKE 1 TABLET BY MOUTH TWICE DAILY 180 tablet 0  . warfarin (COUMADIN) 5 MG tablet Take 1 tablet (5 mg total) by mouth daily. 90 tablet 1   No facility-administered medications prior to visit.     Allergies  Allergen Reactions  . Penicillins     REACTION: Penicillin    Review of Systems  Constitutional: Negative for fever and malaise/fatigue.  HENT: Negative for congestion.   Eyes: Negative for blurred vision.  Respiratory: Negative for cough and shortness of breath.   Cardiovascular: Negative for chest pain, palpitations and leg swelling.  Gastrointestinal: Negative for vomiting.  Musculoskeletal: Negative for back pain.  Skin: Negative for rash.  Neurological: Negative for loss of consciousness and headaches.       Objective:    Physical Exam  Constitutional: He appears well-developed and well-nourished.  HENT:  Head:  Normocephalic and atraumatic.  Eyes: Conjunctivae are normal.  Neck: Normal range of motion.  Cardiovascular: Normal rate and regular rhythm.   Pulmonary/Chest: Effort normal.  Abdominal: Soft. Bowel sounds are normal.  Musculoskeletal: Normal range of motion. He exhibits no deformity.  Neurological: He is alert.  Skin: Skin is warm and dry.  .S: The patient complains of symptomatic skin tags on the R axilla . These are irritated by clothing, jewelry and rubbing.  O: Patient appears well. Several benign skin tags are noted on the r axilla A: Skin tags   P: Skin tags are snipped off using Betadine for cleansing and sterile iris scissors. Local anesthesia was not used. These pathognomonic lesions are not sent for pathology.  Psychiatric: He has a normal mood and affect.  Nursing note and vitals reviewed.   BP 130/80 (BP Location: Left Arm, Cuff Size: Large)   Pulse 89   Temp 98.3 F (36.8 C) (Oral)   Resp 16   Ht 6\' 1"  (1.854 m)   Wt (!) 341 lb 9.6 oz (154.9 kg)   SpO2 97%   BMI 45.07 kg/m  Wt Readings from Last 3 Encounters:  08/22/16 (!) 341 lb 9.6 oz (154.9 kg)  06/29/16 (!) 341 lb 9.6 oz (154.9 kg)  05/27/15 (!) 334 lb (151.5 kg)     Lab Results  Component Value Date   WBC 8.9 06/29/2016   HGB 14.7 06/29/2016   HCT 44.7 06/29/2016   PLT 223.0 06/29/2016   GLUCOSE 83 06/29/2016   CHOL 125 06/29/2016   TRIG 111.0 06/29/2016   HDL 35.20 (L) 06/29/2016   LDLCALC 67 06/29/2016   ALT 16 06/29/2016   AST 18 06/29/2016   NA 142 06/29/2016   K 4.0 06/29/2016   CL 107 06/29/2016   CREATININE 1.02 06/29/2016   BUN 17 06/29/2016   CO2 23 06/29/2016   TSH 1.04 06/29/2016   PSA 0.83 06/29/2016   INR 2.6 08/01/2016   MICROALBUR 1.3 10/22/2012    Lab Results  Component Value Date   TSH 1.04 06/29/2016   Lab Results  Component Value Date   WBC 8.9 06/29/2016   HGB 14.7 06/29/2016   HCT 44.7 06/29/2016   MCV 83.3 06/29/2016   PLT 223.0 06/29/2016   Lab Results   Component Value Date   NA 142 06/29/2016   K 4.0 06/29/2016   CO2 23 06/29/2016   GLUCOSE 83 06/29/2016   BUN 17 06/29/2016   CREATININE 1.02 06/29/2016   BILITOT 0.6 06/29/2016  ALKPHOS 50 06/29/2016   AST 18 06/29/2016   ALT 16 06/29/2016   PROT 6.9 06/29/2016   ALBUMIN 4.0 06/29/2016   CALCIUM 9.1 06/29/2016   GFR 95.22 06/29/2016   Lab Results  Component Value Date   CHOL 125 06/29/2016   Lab Results  Component Value Date   HDL 35.20 (L) 06/29/2016   Lab Results  Component Value Date   LDLCALC 67 06/29/2016   Lab Results  Component Value Date   TRIG 111.0 06/29/2016   Lab Results  Component Value Date   CHOLHDL 4 06/29/2016   No results found for: HGBA1C     Assessment & Plan:   Problem List Items Addressed This Visit    None    Visit Diagnoses    Skin tag    -  Primary    keep dry for 24 hours then pt may shower as usual   I have discontinued Mr. Christopoulos's benzonatate and guaiFENesin-codeine. I am also having him maintain his fish oil-omega-3 fatty acids, tadalafil, Flaxseed (Linseed), ALPRAZolam, cetirizine, fluticasone, atorvastatin, amLODipine, valsartan, albuterol, and warfarin.  No orders of the defined types were placed in this encounter.   CMA served as Education administrator during this visit. History, Physical and Plan performed by medical provider. Documentation and orders reviewed and attested to.  Ann Held, DO .

## 2016-08-22 NOTE — Progress Notes (Signed)
Pre visit review using our clinic review tool, if applicable. No additional management support is needed unless otherwise documented below in the visit note. 

## 2016-08-31 ENCOUNTER — Encounter: Payer: Self-pay | Admitting: Gastroenterology

## 2016-09-09 ENCOUNTER — Ambulatory Visit (INDEPENDENT_AMBULATORY_CARE_PROVIDER_SITE_OTHER): Payer: Commercial Managed Care - HMO | Admitting: Emergency Medicine

## 2016-09-09 VITALS — BP 142/76 | HR 107 | Temp 101.0°F | Resp 24 | Ht 71.5 in | Wt 331.1 lb

## 2016-09-09 DIAGNOSIS — A09 Infectious gastroenteritis and colitis, unspecified: Secondary | ICD-10-CM | POA: Diagnosis not present

## 2016-09-09 DIAGNOSIS — R300 Dysuria: Secondary | ICD-10-CM | POA: Diagnosis not present

## 2016-09-09 DIAGNOSIS — R5081 Fever presenting with conditions classified elsewhere: Secondary | ICD-10-CM | POA: Diagnosis not present

## 2016-09-09 DIAGNOSIS — R197 Diarrhea, unspecified: Secondary | ICD-10-CM

## 2016-09-09 LAB — POCT CBC
Granulocyte percent: 80.7 %G — AB (ref 37–80)
HCT, POC: 41.8 % — AB (ref 43.5–53.7)
HEMOGLOBIN: 14.3 g/dL (ref 14.1–18.1)
LYMPH, POC: 1.4 (ref 0.6–3.4)
MCH, POC: 27.9 pg (ref 27–31.2)
MCHC: 34.3 g/dL (ref 31.8–35.4)
MCV: 81.4 fL (ref 80–97)
MID (CBC): 0.6 (ref 0–0.9)
MPV: 8.4 fL (ref 0–99.8)
POC Granulocyte: 8.1 — AB (ref 2–6.9)
POC LYMPH PERCENT: 13.8 %L (ref 10–50)
POC MID %: 5.5 % (ref 0–12)
Platelet Count, POC: 219 10*3/uL (ref 142–424)
RBC: 5.13 M/uL (ref 4.69–6.13)
RDW, POC: 14.5 %
WBC: 10 10*3/uL (ref 4.6–10.2)

## 2016-09-09 LAB — POCT URINALYSIS DIP (MANUAL ENTRY)
Glucose, UA: NEGATIVE
Ketones, POC UA: NEGATIVE
Leukocytes, UA: NEGATIVE
Nitrite, UA: NEGATIVE
PH UA: 6
SPEC GRAV UA: 1.02
UROBILINOGEN UA: 2

## 2016-09-09 LAB — POC MICROSCOPIC URINALYSIS (UMFC): MUCUS RE: ABSENT

## 2016-09-09 MED ORDER — DOXYCYCLINE HYCLATE 100 MG PO TABS: 100.0000 mg | ORAL_TABLET | Freq: Two times a day (BID) | ORAL | 0 refills | Status: AC

## 2016-09-09 NOTE — Progress Notes (Signed)
Daniel Powers 61 y.o.   Chief Complaint  Patient presents with  . Fever  . Chills  . Diarrhea  . Dysuria    HISTORY OF PRESENT ILLNESS: This is a 61 y.o. male complaining of burning on urination since last Wednesday followed by fever and diarrhea yesterday. Denies cough or flu-like symptoms; denies n/v, abdominal pain, rash, or any other significant symptoms. Suspects undercooked eggs.  HPI   Prior to Admission medications   Medication Sig Start Date End Date Taking? Authorizing Provider  albuterol (PROAIR HFA) 108 (90 Base) MCG/ACT inhaler Inhale 2 puffs into the lungs every 6 (six) hours as needed for wheezing. 06/29/16  Yes Yvonne R Lowne Chase, DO  ALPRAZolam Duanne Moron) 0.5 MG tablet Take 0.5 mg by mouth at bedtime as needed.   Yes Historical Provider, MD  amLODipine (NORVASC) 5 MG tablet Take 1 tablet (5 mg total) by mouth daily. 06/29/16  Yes Yvonne R Lowne Chase, DO  atorvastatin (LIPITOR) 10 MG tablet Take 1 tablet (10 mg total) by mouth daily. 06/29/16  Yes Yvonne R Lowne Chase, DO  cetirizine (ZYRTEC) 10 MG tablet Take 10 mg by mouth daily.   Yes Historical Provider, MD  fish oil-omega-3 fatty acids 1000 MG capsule Take 1 g by mouth daily.   Yes Historical Provider, MD  Flaxseed, Linseed, 1000 MG CAPS Take 1 capsule by mouth daily.   Yes Historical Provider, MD  fluticasone (FLONASE) 50 MCG/ACT nasal spray Place 2 sprays into both nostrils daily. 05/27/15  Yes Yvonne R Lowne Chase, DO  tadalafil (CIALIS) 20 MG tablet Take 20 mg by mouth daily as needed.   Yes Historical Provider, MD  valsartan (DIOVAN) 160 MG tablet Take 1 tablet (160 mg total) by mouth 2 (two) times daily. 06/29/16  Yes Yvonne R Lowne Chase, DO  warfarin (COUMADIN) 5 MG tablet TAKE 1 TABLET BY MOUTH DAILY 07/12/16  Yes Rosalita Chessman Chase, DO    Allergies  Allergen Reactions  . Penicillins     REACTION: Penicillin    Patient Active Problem List   Diagnosis Date Noted  . 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 Preventative use of agents affecting estrogen receptors or levels 11/26/2014  . Preventative health care 11/26/2014  . Severe obesity (BMI >= 40) ([place]) 12/02/2013  . Morbid obesity ([place]) 08/08/2013  . Long term (current) use of anticoagulants 08/08/2013  . Other and unspecified hyperlipidemia 10/22/2012  . Personal history of colonic polyps 01/02/2012  . Musculoskeletal pain 01/02/2012  . Anxiety 11/21/2011  . OSA (obstructive sleep apnea) 11/21/2011  . Special screening for malignant neoplasms, colon 09/20/2011  . DVT, HX OF 09/12/2010  . DVT 03/03/2010  . GOUT 09/06/2009  . COSTOCHONDRITIS 09/06/2009  . FOREIGN BODY, HAND 01/16/2008  . LEG PAIN, RIGHT 01/14/2008  . PNEUMONIA DUE TO OTHER SPECIFIED BACTERIA 10/09/2007  . INFLUENZA WITH PNEUMONIA 10/09/2007  . ERECTILE DYSFUNCTION 09/02/2006  . HYPERTENSION 09/02/2006  . ALLERGIC RHINITIS 09/02/2006    Past Medical History:  Diagnosis Date  . DVT (deep venous thrombosis) ([place])   . Gout   . Hypertension   . Influenza with pneumonia   . Psychosexual dysfunction with inhibited sexual excitement     Past Surgical History:  Procedure Laterality Date  . HAND SURGERY     right  . TONSILLECTOMY AND ADENOIDECTOMY      Social History   Social History  . Marital status: Married    Spouse name:  Becky  . Number of children: 1  . Years of education: N/A   Occupational History  . attorney at law--  w/s Self   Social History Main Topics  . Smoking status: Never Smoker  . Smokeless tobacco: Never Used  . Alcohol use Yes     Comment: ocass- once or twice a month  . Drug use: No  . Sexual activity: Yes    Partners: Female   Other Topics Concern  . Not on file   Social History Narrative   Exercise --a little-- walking     Family History  Problem Relation Age of Onset  . Hypertension Mother   . Alzheimer's disease Mother   . Hypertension Father   .  Alcohol abuse Father   . Cancer Sister     "male organs"  . Lung cancer Maternal Uncle   . Esophageal cancer Maternal Uncle   . Alzheimer's disease Paternal Aunt   . Hypertension Maternal Grandmother   . Alzheimer's disease Maternal Grandmother   . Hypertension Maternal Grandfather   . Hypertension Paternal Grandmother   . Hypertension Paternal Grandfather   . Heart disease Brother 59    MI     Review of Systems  Constitutional: Positive for chills and fever.  HENT: Negative for congestion, ear pain, nosebleeds, sinus pain and sore throat.   Eyes: Negative for discharge and redness.  Respiratory: Negative for cough, shortness of breath and wheezing.   Cardiovascular: Negative for chest pain, palpitations and leg swelling.  Gastrointestinal: Positive for diarrhea. Negative for abdominal pain, blood in stool, nausea and vomiting.  Genitourinary: Positive for dysuria and frequency. Negative for flank pain and hematuria.  Musculoskeletal: Negative for back pain and myalgias.  Skin: Negative for rash.  Neurological: Negative for dizziness and headaches.  All other systems reviewed and are negative.  Vitals:   09/09/16 1346  BP: (!) 142/76  Pulse: (!) 107  Resp: (!) 24  Temp: (!) 101 F (38.3 C)     Physical Exam  Constitutional: He is oriented to person, place, and time. He appears well-developed and well-nourished.  HENT:  Head: Normocephalic and atraumatic.  Nose: Nose normal.  Mouth/Throat: Oropharynx is clear and moist. No oropharyngeal exudate.  Eyes: Conjunctivae and EOM are normal. Pupils are equal, round, and reactive to light.  Neck: Normal range of motion. Neck supple. No JVD present. No thyromegaly present.  Cardiovascular: Normal rate, regular rhythm and normal heart sounds.   No murmur heard. Pulmonary/Chest: Effort normal and breath sounds normal.  Abdominal: Soft. Bowel sounds are normal. He exhibits no distension. There is no tenderness.    Musculoskeletal: Normal range of motion.  Lymphadenopathy:    He has no cervical adenopathy.  Neurological: He is alert and oriented to person, place, and time. No sensory deficit. He exhibits normal muscle tone.  Skin: Skin is warm and dry. Capillary refill takes less than 2 seconds.  Psychiatric: He has a normal mood and affect. His behavior is normal.  Vitals reviewed.    ASSESSMENT & PLAN: Nalan was seen today for fever, chills, diarrhea and dysuria.  Diagnoses and all orders for this visit:  Dysuria -     POCT Microscopic Urinalysis ([facility]) -     POCT urinalysis dipstick -     POCT CBC -     Urine culture  Fever in other diseases -     POCT CBC -     Urine culture  Diarrhea of presumed infectious origin  Other orders -  doxycycline (VIBRA-TABS) 100 MG tablet; Take 1 tablet (100 mg total) by mouth 2 (two) times daily.    Patient Instructions       IF you received an x-ray today, you will receive an invoice from [facility]. Please contact [facility] at [phone] with questions or concerns regarding your invoice.   IF you received labwork today, you will receive an invoice from [facility]. Please contact [facility] at [phone] with questions or concerns regarding your invoice.   Our billing staff will not be able to assist you with questions regarding bills from these companies.  You will be contacted with the lab results as soon as they are available. The fastest way to get your results is to activate your My Chart account. Instructions are located on the last page of this paperwork. If you have not heard from [name] regarding the results in 2 weeks, please contact this office.     Bland Diet Introduction A bland diet consists of foods that do not have a lot of fat or fiber. Foods without fat or fiber are easier for the body to digest. They are also less likely to irritate your mouth, throat, stomach, and other parts of your  gastrointestinal tract. A bland diet is sometimes called a BRAT diet. What is my plan? Your health care provider or dietitian may recommend specific changes to your diet to prevent and treat your symptoms, such as:  Eating small meals often.  Cooking food until it is soft enough to chew easily.  Chewing your food well.  Drinking fluids slowly.  Not eating foods that are very spicy, sour, or fatty.  Not eating citrus fruits, such as oranges and grapefruit. What do I need to know about this diet?  Eat a variety of foods from the bland diet food list.  Do not follow a bland diet longer than you have to.  Ask your health care provider whether you should take vitamins. What foods can I eat? Grains  Hot cereals, such as cream of wheat. Bread, crackers, or tortillas made from refined white flour. Rice. Vegetables  Canned or cooked vegetables. Mashed or boiled potatoes. Fruits  Bananas. Applesauce. Other types of cooked or canned fruit with the skin and seeds removed, such as canned peaches or pears. Meats and Other Protein Sources  Scrambled eggs. Creamy peanut butter or other nut butters. Lean, well-cooked meats, such as chicken or fish. Tofu. Soups or broths. Dairy  Low-fat dairy products, such as milk, cottage cheese, or yogurt. Beverages  Water. Herbal tea. Apple juice. Sweets and Desserts  Pudding. Custard. Fruit gelatin. Ice cream. Fats and Oils  Mild salad dressings. Canola or olive oil. The items listed above may not be a complete list of allowed foods or beverages. Contact your dietitian for more options.  What foods are not recommended? Foods and ingredients that are often not recommended include:  Spicy foods, such as hot sauce or salsa.  Fried foods.  Sour foods, such as pickled or fermented foods.  Raw vegetables or fruits, especially citrus or berries.  Caffeinated drinks.  Alcohol.  Strongly flavored seasonings or condiments. The items listed above may  not be a complete list of foods and beverages that are not allowed. Contact your dietitian for more information.  This information is not intended to replace advice given to you by your health care provider. Make sure you discuss any questions you have with your health care provider. Document Released: 10/25/2015 Document Revised: 12/09/2015 Document Reviewed: 07/15/2014  2017 Elsevier      Agustina Caroli, MD [facility]

## 2016-09-09 NOTE — Patient Instructions (Addendum)
     IF you received an x-ray today, you will receive an invoice from [facility]. Please contact [facility] at [phone] with questions or concerns regarding your invoice.   IF you received labwork today, you will receive an invoice from [facility]. Please contact [facility] at [phone] with questions or concerns regarding your invoice.   Our billing staff will not be able to assist you with questions regarding bills from these companies.  You will be contacted with the lab results as soon as they are available. The fastest way to get your results is to activate your My Chart account. Instructions are located on the last page of this paperwork. If you have not heard from [name] regarding the results in 2 weeks, please contact this office.     Bland Diet Introduction A bland diet consists of foods that do not have a lot of fat or fiber. Foods without fat or fiber are easier for the body to digest. They are also less likely to irritate your mouth, throat, stomach, and other parts of your gastrointestinal tract. A bland diet is sometimes called a BRAT diet. What is my plan? Your health care provider or dietitian may recommend specific changes to your diet to prevent and treat your symptoms, such as:  Eating small meals often.  Cooking food until it is soft enough to chew easily.  Chewing your food well.  Drinking fluids slowly.  Not eating foods that are very spicy, sour, or fatty.  Not eating citrus fruits, such as oranges and grapefruit. What do I need to know about this diet?  Eat a variety of foods from the bland diet food list.  Do not follow a bland diet longer than you have to.  Ask your health care provider whether you should take vitamins. What foods can I eat? Grains  Hot cereals, such as cream of wheat. Bread, crackers, or tortillas made from refined white flour. Rice. Vegetables  Canned or cooked vegetables. Mashed or boiled potatoes. Fruits   Bananas. Applesauce. Other types of cooked or canned fruit with the skin and seeds removed, such as canned peaches or pears. Meats and Other Protein Sources  Scrambled eggs. Creamy peanut butter or other nut butters. Lean, well-cooked meats, such as chicken or fish. Tofu. Soups or broths. Dairy  Low-fat dairy products, such as milk, cottage cheese, or yogurt. Beverages  Water. Herbal tea. Apple juice. Sweets and Desserts  Pudding. Custard. Fruit gelatin. Ice cream. Fats and Oils  Mild salad dressings. Canola or olive oil. The items listed above may not be a complete list of allowed foods or beverages. Contact your dietitian for more options.  What foods are not recommended? Foods and ingredients that are often not recommended include:  Spicy foods, such as hot sauce or salsa.  Fried foods.  Sour foods, such as pickled or fermented foods.  Raw vegetables or fruits, especially citrus or berries.  Caffeinated drinks.  Alcohol.  Strongly flavored seasonings or condiments. The items listed above may not be a complete list of foods and beverages that are not allowed. Contact your dietitian for more information.  This information is not intended to replace advice given to you by your health care provider. Make sure you discuss any questions you have with your health care provider. Document Released: 10/25/2015 Document Revised: 12/09/2015 Document Reviewed: 07/15/2014  2017 Elsevier

## 2016-09-11 LAB — URINE CULTURE: Organism ID, Bacteria: NO GROWTH

## 2016-09-12 ENCOUNTER — Ambulatory Visit (INDEPENDENT_AMBULATORY_CARE_PROVIDER_SITE_OTHER): Payer: Commercial Managed Care - HMO | Admitting: Behavioral Health

## 2016-09-12 DIAGNOSIS — Z86718 Personal history of other venous thrombosis and embolism: Secondary | ICD-10-CM | POA: Diagnosis not present

## 2016-09-12 LAB — POCT INR: INR: 2.8

## 2016-09-12 NOTE — Patient Instructions (Signed)
Per Dr. Carollee Herter: Continue taking Warfarin 5 mg daily and return for INR check in 2 weeks.

## 2016-09-12 NOTE — Progress Notes (Addendum)
Pre visit review using our clinic review tool, if applicable. No additional management support is needed unless otherwise documented below in the visit note.  Patient came in office today for INR check. Verified medication & regimen. Patient reported that he's currently taking doxycycline effective 09/09/16 and will complete the antibiotic in 3 more days. All other findings were negative. INR reading was 2.8.  Per Dr. Carollee Herter: Continue taking Warfarin 5 mg daily and return for INR check in 2 weeks.  Informed patient of the provider's recommendations. He verbalized understanding and did not have any further questions or concerns before leaving the nurse visit.  Next appointment 09/26/16 at 9:15 AM.

## 2016-09-12 NOTE — Progress Notes (Signed)
reviewed

## 2016-09-26 ENCOUNTER — Ambulatory Visit: Payer: Commercial Managed Care - HMO

## 2016-10-03 ENCOUNTER — Ambulatory Visit (INDEPENDENT_AMBULATORY_CARE_PROVIDER_SITE_OTHER): Payer: Commercial Managed Care - HMO

## 2016-10-03 DIAGNOSIS — Z7901 Long term (current) use of anticoagulants: Secondary | ICD-10-CM | POA: Diagnosis not present

## 2016-10-03 LAB — POCT INR: INR: 2.8

## 2016-10-03 NOTE — Assessment & Plan Note (Signed)
Pt has been on coumadin for > 5 years for dvt He has been very compliant and is a good candidate for home monitoring

## 2016-10-03 NOTE — Progress Notes (Signed)
Pre visit review using our clinic tool,if applicable. No additional management support is needed unless otherwise documented below in the visit note.   Patient in for INR check per order from Dr. Carollee Herter.   INR = 2.8 today. Patient taking Warfarin 5 mg daily. Per Dr. Carollee Herter. Patient to return in 6 weeks for INR check. Will discuss home INR monitoring at that time.   Patient notified,appointment given.

## 2016-10-03 NOTE — Progress Notes (Signed)
Reviewed Please arrange for home monitoring for pt

## 2016-10-14 ENCOUNTER — Other Ambulatory Visit: Payer: Self-pay | Admitting: Family Medicine

## 2016-11-14 ENCOUNTER — Ambulatory Visit (INDEPENDENT_AMBULATORY_CARE_PROVIDER_SITE_OTHER): Payer: Commercial Managed Care - HMO | Admitting: Behavioral Health

## 2016-11-14 ENCOUNTER — Telehealth: Payer: Self-pay | Admitting: *Deleted

## 2016-11-14 DIAGNOSIS — Z86718 Personal history of other venous thrombosis and embolism: Secondary | ICD-10-CM | POA: Diagnosis not present

## 2016-11-14 LAB — POCT INR: INR: 2.9

## 2016-11-14 NOTE — Patient Instructions (Signed)
Continue taking Warfarin 5 mg daily and return for INR check in 6 weeks.

## 2016-11-14 NOTE — Progress Notes (Signed)
Pre visit review using our clinic review tool, if applicable. No additional management support is needed unless otherwise documented below in the visit note.  Patient presents in clinic for INR check. He voices adherence to the current medication regimen. Patient reported no changes or positive findings. Today's INR reading was 2.9.  Advised patient to continue taking Warfarin 5 mg daily and return for INR check in 6 weeks.  He verbalized understanding. Next appointment scheduled for 12/26/16 at 9:30 AM.

## 2016-11-14 NOTE — Telephone Encounter (Signed)
Called patient and left message to return call to discuss options for weekly home INR monitoring through [place].

## 2016-12-26 ENCOUNTER — Ambulatory Visit (INDEPENDENT_AMBULATORY_CARE_PROVIDER_SITE_OTHER): Payer: Commercial Managed Care - HMO

## 2016-12-26 DIAGNOSIS — Z7901 Long term (current) use of anticoagulants: Secondary | ICD-10-CM | POA: Diagnosis not present

## 2016-12-26 LAB — POCT INR: INR: 2.5

## 2016-12-26 NOTE — Progress Notes (Signed)
Pre visit review using our clinic tool,if applicable. No additional management support is needed unless otherwise documented below in the visit note.   Patient in today for INR check per orders dated 11/14/2016.   INR today = 2.6 Goal = 2.0-3.0  Per Dr. Larose Kells for [facility] patient to continue taking Coumadin 5mg  daily and return for recheck in 4 weeks.   Appointment scheduled for patient on July 10,2018. Patient aware.

## 2016-12-28 ENCOUNTER — Ambulatory Visit: Payer: Commercial Managed Care - HMO | Admitting: Family Medicine

## 2017-01-04 ENCOUNTER — Other Ambulatory Visit: Payer: Self-pay | Admitting: Family Medicine

## 2017-01-04 DIAGNOSIS — I10 Essential (primary) hypertension: Secondary | ICD-10-CM

## 2017-01-09 ENCOUNTER — Ambulatory Visit (INDEPENDENT_AMBULATORY_CARE_PROVIDER_SITE_OTHER): Payer: Commercial Managed Care - HMO | Admitting: Family Medicine

## 2017-01-09 ENCOUNTER — Encounter: Payer: Self-pay | Admitting: Family Medicine

## 2017-01-09 VITALS — BP 136/80 | HR 75 | Temp 98.1°F | Resp 16 | Ht 72.0 in | Wt 336.8 lb

## 2017-01-09 DIAGNOSIS — E785 Hyperlipidemia, unspecified: Secondary | ICD-10-CM | POA: Diagnosis not present

## 2017-01-09 DIAGNOSIS — Z1211 Encounter for screening for malignant neoplasm of colon: Secondary | ICD-10-CM | POA: Diagnosis not present

## 2017-01-09 DIAGNOSIS — I10 Essential (primary) hypertension: Secondary | ICD-10-CM | POA: Diagnosis not present

## 2017-01-09 DIAGNOSIS — Z7901 Long term (current) use of anticoagulants: Secondary | ICD-10-CM

## 2017-01-09 LAB — POCT INR: INR: 2

## 2017-01-09 NOTE — Progress Notes (Signed)
Patient ID: MARSEL Powers, male   DOB: 09-Jun-1956, 61 y.o.   MRN: [id number]     Subjective:  I acted as a Education administrator for Dr. Carollee Herter.  Daniel Powers, [name]   Patient ID: Daniel Powers, male    DOB: 12-Aug-1955, 61 y.o.   MRN: [id number]  Chief Complaint  Patient presents with  . Hypertension  . Hyperlipidemia    Hypertension  This is a chronic problem. Pertinent negatives include no blurred vision, chest pain, headaches, malaise/fatigue, palpitations or shortness of breath. Past treatments include angiotensin blockers and calcium channel blockers.  Hyperlipidemia  This is a chronic problem. Pertinent negatives include no chest pain or shortness of breath. Current antihyperlipidemic treatment includes statins.    Patient is in today for follow up blood pressure and cholesterol.  Patient Care Team: Carollee Herter, Alferd Apa, DO as PCP - General Irine Seal, MD as Attending Physician (Urology) Irine Seal, MD as Attending Physician (Urology) Donell Sievert, MD as Referring Physician (Ophthalmology)   Past Medical History:  Diagnosis Date  . DVT (deep venous thrombosis) ([place])   . Gout   . Hypertension   . Influenza with pneumonia   . Psychosexual dysfunction with inhibited sexual excitement     Past Surgical History:  Procedure Laterality Date  . HAND SURGERY     right  . TONSILLECTOMY AND ADENOIDECTOMY      Family History  Problem Relation Age of Onset  . Hypertension Mother   . Alzheimer's disease Mother   . Hypertension Father   . Alcohol abuse Father   . Cancer Sister        "male organs"  . Lung cancer Maternal Uncle   . Esophageal cancer Maternal Uncle   . Alzheimer's disease Paternal Aunt   . Hypertension Maternal Grandmother   . Alzheimer's disease Maternal Grandmother   . Hypertension Maternal Grandfather   . Hypertension Paternal Grandmother   . Hypertension Paternal Grandfather   . Heart disease Brother 47       MI    Social History   Social History  .  Marital status: Married    Spouse name: Daniel Powers  . Number of children: 1  . Years of education: N/A   Occupational History  . attorney at law--  w/s Self   Social History Main Topics  . Smoking status: Never Smoker  . Smokeless tobacco: Never Used  . Alcohol use Yes     Comment: ocass- once or twice a month  . Drug use: No  . Sexual activity: Yes    Partners: Female   Other Topics Concern  . Not on file   Social History Narrative   Exercise --a little-- walking     Outpatient Medications Prior to Visit  Medication Sig Dispense Refill  . albuterol (PROAIR HFA) 108 (90 Base) MCG/ACT inhaler Inhale 2 puffs into the lungs every 6 (six) hours as needed for wheezing. 1 Inhaler 2  . ALPRAZolam (XANAX) 0.5 MG tablet Take 0.5 mg by mouth at bedtime as needed.    [name] amLODipine (NORVASC) 5 MG tablet TAKE 1 TABLET(5 MG) BY MOUTH DAILY 90 tablet 0  . atorvastatin (LIPITOR) 10 MG tablet Take 1 tablet (10 mg total) by mouth daily. 90 tablet 1  . cetirizine (ZYRTEC) 10 MG tablet Take 10 mg by mouth daily.    . fish oil-omega-3 fatty acids 1000 MG capsule Take 1 g by mouth daily.    . Flaxseed, Linseed, 1000 MG CAPS Take 1 capsule by mouth  daily.    . fluticasone (FLONASE) 50 MCG/ACT nasal spray Place 2 sprays into both nostrils daily. 16 g 6  . tadalafil (CIALIS) 20 MG tablet Take 20 mg by mouth daily as needed.    . valsartan (DIOVAN) 160 MG tablet Take 1 tablet (160 mg total) by mouth 2 (two) times daily. 180 tablet 1  . warfarin (COUMADIN) 5 MG tablet TAKE 1 TABLET BY MOUTH DAILY 90 tablet 0  . valsartan (DIOVAN) 160 MG tablet TAKE 1 TABLET BY MOUTH TWICE DAILY 180 tablet 0   No facility-administered medications prior to visit.     Allergies  Allergen Reactions  . Penicillins     REACTION: Penicillin    Review of Systems  Constitutional: Negative for fever and malaise/fatigue.  HENT: Negative for congestion.   Eyes: Negative for blurred vision.  Respiratory: Negative for cough and  shortness of breath.   Cardiovascular: Negative for chest pain, palpitations and leg swelling.  Gastrointestinal: Negative for vomiting.  Musculoskeletal: Negative for back pain.  Skin: Negative for rash.  Neurological: Negative for loss of consciousness and headaches.       Objective:    Physical Exam  Constitutional: He is oriented to person, place, and time. Vital signs are normal. He appears well-developed and well-nourished. He is sleeping.  HENT:  Head: Normocephalic and atraumatic.  Mouth/Throat: Oropharynx is clear and moist.  Eyes: EOM are normal. Pupils are equal, round, and reactive to light.  Neck: Normal range of motion. Neck supple. No thyromegaly present.  Cardiovascular: Normal rate and regular rhythm.   No murmur heard. Pulmonary/Chest: Effort normal and breath sounds normal. No respiratory distress. He has no wheezes. He has no rales. He exhibits no tenderness.  Musculoskeletal: He exhibits no edema or tenderness.  Neurological: He is alert and oriented to person, place, and time.  Skin: Skin is warm and dry.  Psychiatric: He has a normal mood and affect. His behavior is normal. Judgment and thought content normal.  Nursing note and vitals reviewed.   BP 136/80 (BP Location: Left Arm, Cuff Size: Large)   Pulse 75   Temp 98.1 F (36.7 C) (Oral)   Resp 16   Ht 6' (1.829 m)   Wt (!) 336 lb 12.8 oz (152.8 kg)   SpO2 98%   BMI 45.68 kg/m  Wt Readings from Last 3 Encounters:  01/09/17 (!) 336 lb 12.8 oz (152.8 kg)  09/09/16 (!) 331 lb 2 oz (150.2 kg)  08/22/16 (!) 341 lb 9.6 oz (154.9 kg)   BP Readings from Last 3 Encounters:  01/09/17 136/80  09/09/16 (!) 142/76  08/22/16 130/80     Immunization History  Administered Date(s) Administered  . Influenza Split 05/04/2011  . Influenza Whole 07/26/2010  . Influenza,inj,Quad PF,36+ Mos 06/03/2013, 06/18/2014, 07/29/2015, 06/29/2016  . Pneumococcal Polysaccharide-23 09/12/2010  . Td 09/06/2009  . Zoster  11/26/2014    Health Maintenance  Topic Date Due  . COLONOSCOPY  10/05/2016  . INFLUENZA VACCINE  02/14/2017  . TETANUS/TDAP  09/07/2019  . Hepatitis C Screening  Completed  . HIV Screening  Completed    Lab Results  Component Value Date   WBC 10.0 09/09/2016   HGB 14.3 09/09/2016   HCT 41.8 (A) 09/09/2016   PLT 223.0 06/29/2016   GLUCOSE 90 01/09/2017   CHOL 125 01/09/2017   TRIG 72.0 01/09/2017   HDL 34.80 (L) 01/09/2017   LDLCALC 75 01/09/2017   ALT 14 01/09/2017   AST 13 01/09/2017   NA  137 01/09/2017   K 4.0 01/09/2017   CL 103 01/09/2017   CREATININE 0.97 01/09/2017   BUN 12 01/09/2017   CO2 30 01/09/2017   TSH 1.04 06/29/2016   PSA 0.83 06/29/2016   INR 2.0 01/09/2017   MICROALBUR 1.3 10/22/2012    Lab Results  Component Value Date   TSH 1.04 06/29/2016   Lab Results  Component Value Date   WBC 10.0 09/09/2016   HGB 14.3 09/09/2016   HCT 41.8 (A) 09/09/2016   MCV 81.4 09/09/2016   PLT 223.0 06/29/2016   Lab Results  Component Value Date   NA 137 01/09/2017   K 4.0 01/09/2017   CO2 30 01/09/2017   GLUCOSE 90 01/09/2017   BUN 12 01/09/2017   CREATININE 0.97 01/09/2017   BILITOT 0.9 01/09/2017   ALKPHOS 47 01/09/2017   AST 13 01/09/2017   ALT 14 01/09/2017   PROT 6.8 01/09/2017   ALBUMIN 3.9 01/09/2017   CALCIUM 9.2 01/09/2017   GFR 100.73 01/09/2017   Lab Results  Component Value Date   CHOL 125 01/09/2017   Lab Results  Component Value Date   HDL 34.80 (L) 01/09/2017   Lab Results  Component Value Date   LDLCALC 75 01/09/2017   Lab Results  Component Value Date   TRIG 72.0 01/09/2017   Lab Results  Component Value Date   CHOLHDL 4 01/09/2017   No results found for: HGBA1C       Assessment & Plan:   Problem List Items Addressed This Visit      Unprioritized   Long term (current) use of anticoagulants   Relevant Orders   POCT INR (Completed)   Essential hypertension - Primary    Well controlled, no changes to meds.  Encouraged heart healthy diet such as the DASH diet and exercise as tolerated.       Relevant Orders   Comprehensive metabolic panel (Completed)   Lipid panel (Completed)   Hyperlipidemia    Tolerating statin, encouraged heart healthy diet, avoid trans fats, minimize simple carbs and saturated fats. Increase exercise as tolerated      Relevant Orders   Comprehensive metabolic panel (Completed)   Lipid panel (Completed)    Other Visit Diagnoses    Colon cancer screening       Relevant Orders   Ambulatory referral to Gastroenterology      I am having Mr. Dedman maintain his fish oil-omega-3 fatty acids, tadalafil, Flaxseed (Linseed), ALPRAZolam, cetirizine, fluticasone, atorvastatin, valsartan, albuterol, warfarin, and amLODipine.  No orders of the defined types were placed in this encounter.   CMA served as Education administrator during this visit. History, Physical and Plan performed by medical provider. Documentation and orders reviewed and attested to.  Ann Held, DO

## 2017-01-09 NOTE — Patient Instructions (Signed)

## 2017-01-10 LAB — COMPREHENSIVE METABOLIC PANEL
ALBUMIN: 3.9 g/dL (ref 3.5–5.2)
ALK PHOS: 47 U/L (ref 39–117)
ALT: 14 U/L (ref 0–53)
AST: 13 U/L (ref 0–37)
BUN: 12 mg/dL (ref 6–23)
CHLORIDE: 103 meq/L (ref 96–112)
CO2: 30 mEq/L (ref 19–32)
Calcium: 9.2 mg/dL (ref 8.4–10.5)
Creatinine, Ser: 0.97 mg/dL (ref 0.40–1.50)
GFR: 100.73 mL/min (ref >60.00)
Glucose, Bld: 90 mg/dL (ref 70–99)
POTASSIUM: 4 meq/L (ref 3.5–5.1)
Sodium: 137 mEq/L (ref 135–145)
TOTAL PROTEIN: 6.8 g/dL (ref 6.0–8.3)
Total Bilirubin: 0.9 mg/dL (ref 0.2–1.2)

## 2017-01-10 LAB — LIPID PANEL
Cholesterol: 125 mg/dL (ref 0–200)
HDL: 34.8 mg/dL — AB (ref >39.00)
LDL CALC: 75 mg/dL (ref 0–99)
NonHDL: 89.85
TRIGLYCERIDES: 72 mg/dL (ref 0.0–149.0)
Total CHOL/HDL Ratio: 4
VLDL: 14.4 mg/dL (ref 0.0–40.0)

## 2017-01-10 NOTE — Assessment & Plan Note (Signed)
Tolerating statin, encouraged heart healthy diet, avoid trans fats, minimize simple carbs and saturated fats. Increase exercise as tolerated 

## 2017-01-10 NOTE — Assessment & Plan Note (Signed)
Well controlled, no changes to meds. Encouraged heart healthy diet such as the DASH diet and exercise as tolerated.  °

## 2017-01-13 ENCOUNTER — Other Ambulatory Visit: Payer: Self-pay | Admitting: Family Medicine

## 2017-01-15 ENCOUNTER — Other Ambulatory Visit: Payer: Self-pay | Admitting: Family Medicine

## 2017-01-15 DIAGNOSIS — E785 Hyperlipidemia, unspecified: Secondary | ICD-10-CM

## 2017-01-23 ENCOUNTER — Ambulatory Visit (INDEPENDENT_AMBULATORY_CARE_PROVIDER_SITE_OTHER): Payer: 59 | Admitting: Behavioral Health

## 2017-01-23 DIAGNOSIS — Z86718 Personal history of other venous thrombosis and embolism: Secondary | ICD-10-CM | POA: Diagnosis not present

## 2017-01-23 LAB — POCT INR: INR: 2.6

## 2017-01-23 NOTE — Patient Instructions (Signed)
Continue taking Warfarin 5 mg daily and return for INR check in 4 weeks.

## 2017-01-23 NOTE — Progress Notes (Signed)
Pre visit review using our clinic review tool, if applicable. No additional management support is needed unless otherwise documented below in the visit note.  Patient came in clinic for INR check. He voiced compliance with medication & regimen. Patient reported no positive findings or changes. Today's INR reading was 2.6.  Advised patient to continue taking Warfarin 5 mg daily and return for INR check in 4 weeks. He verbalized understanding. Next appointment scheduled for 02/20/17 at 9:00 AM.

## 2017-01-25 ENCOUNTER — Encounter: Payer: Self-pay | Admitting: Gastroenterology

## 2017-02-05 ENCOUNTER — Telehealth: Payer: Self-pay | Admitting: Family Medicine

## 2017-02-05 MED ORDER — OLMESARTAN MEDOXOMIL 20 MG PO TABS: 20.0000 mg | ORAL_TABLET | Freq: Every day | ORAL | 2 refills | Status: DC

## 2017-02-05 NOTE — Telephone Encounter (Signed)
Pt called in to be advised. He is currently taking valsartan. He would like to be advised due to recall.    CB: [phone]

## 2017-02-05 NOTE — Telephone Encounter (Signed)
Switch to benicar 20 mg 1 po qd #30  2 refills  bp check 2-3 weeks

## 2017-02-05 NOTE — Telephone Encounter (Signed)
All manufacturers have been recalled and they do not know when they are getting another shipment.

## 2017-02-05 NOTE — Telephone Encounter (Signed)
Patient notified and appt for nurse visit made.  rx sent to pharmacy

## 2017-02-10 ENCOUNTER — Other Ambulatory Visit: Payer: Self-pay | Admitting: Family Medicine

## 2017-02-10 DIAGNOSIS — E785 Hyperlipidemia, unspecified: Secondary | ICD-10-CM

## 2017-02-20 ENCOUNTER — Ambulatory Visit: Payer: 59

## 2017-02-20 ENCOUNTER — Ambulatory Visit (INDEPENDENT_AMBULATORY_CARE_PROVIDER_SITE_OTHER): Payer: 59 | Admitting: Internal Medicine

## 2017-02-20 ENCOUNTER — Other Ambulatory Visit: Payer: 59

## 2017-02-20 VITALS — BP 135/81 | HR 86

## 2017-02-20 DIAGNOSIS — I10 Essential (primary) hypertension: Secondary | ICD-10-CM | POA: Diagnosis not present

## 2017-02-20 DIAGNOSIS — Z86718 Personal history of other venous thrombosis and embolism: Secondary | ICD-10-CM | POA: Diagnosis not present

## 2017-02-20 LAB — POCT INR: INR: 2.5

## 2017-02-20 LAB — BASIC METABOLIC PANEL
BUN: 14 mg/dL (ref 6–23)
CHLORIDE: 106 meq/L (ref 96–112)
CO2: 26 mEq/L (ref 19–32)
Calcium: 8.8 mg/dL (ref 8.4–10.5)
Creatinine, Ser: 0.92 mg/dL (ref 0.40–1.50)
GFR: 107.03 mL/min (ref >60.00)
Glucose, Bld: 103 mg/dL — ABNORMAL HIGH (ref 70–99)
POTASSIUM: 3.6 meq/L (ref 3.5–5.1)
SODIUM: 138 meq/L (ref 135–145)

## 2017-02-20 NOTE — Progress Notes (Addendum)
Pre visit review using our clinic review tool, if applicable. No additional management support is needed unless otherwise documented below in the visit note.  Patient came in office for INR & blood pressure check (per telephone note 02/05/17). He reports adherence with medications & regimen. Patient is asymptomatic; denies headaches, dizziness, lightheadedness & etc. Today's INR reading was 2.5; all other vitals were as follow: BP 135/81 P 86 & 02 96%.  Per Dr. Larose Kells: Take Warfarin 5 mg daily and return for INR check in 4 weeks. Also, continue current medications & regimen for blood pressure. Complete BMP lab work today. Follow-up with PCP as directed in last office visit.  Informed patient of the provider's recommendations. He verbalized understanding and did not have any further questions or concerns before leaving the nurse visit.  Kathlene November, MD

## 2017-02-20 NOTE — Patient Instructions (Addendum)
Per Dr. Larose Kells: Take Warfarin 5 mg daily and return for INR check in 4 weeks. Also, continue current medications & regimen for blood pressure. Complete BMP lab work today. Follow-up with PCP as directed in last office visit.

## 2017-03-16 ENCOUNTER — Telehealth: Payer: Self-pay | Admitting: *Deleted

## 2017-03-16 ENCOUNTER — Encounter: Payer: Self-pay | Admitting: Gastroenterology

## 2017-03-16 ENCOUNTER — Ambulatory Visit (INDEPENDENT_AMBULATORY_CARE_PROVIDER_SITE_OTHER): Payer: 59 | Admitting: Gastroenterology

## 2017-03-16 VITALS — BP 120/70 | HR 72 | Ht 71.0 in | Wt 334.2 lb

## 2017-03-16 DIAGNOSIS — Z86010 Personal history of colonic polyps: Secondary | ICD-10-CM | POA: Diagnosis not present

## 2017-03-16 DIAGNOSIS — Z7901 Long term (current) use of anticoagulants: Secondary | ICD-10-CM | POA: Diagnosis not present

## 2017-03-16 MED ORDER — NA SULFATE-K SULFATE-MG SULF 17.5-3.13-1.6 GM/177ML PO SOLN: 1.0000 | Freq: Once | ORAL | 0 refills | Status: AC

## 2017-03-16 NOTE — Telephone Encounter (Signed)
  03/16/2017   RE: Daniel Powers DOB: 1956-02-06 MRN: [id number]   Dear Roma Schanz DO,    We have scheduled the above patient for an endoscopic procedure. Our records show that he is on anticoagulation therapy.   Please advise as to how long the patient may come off his therapy of Coumadin prior to the procedure, which is scheduled for 05-01-2017. Also advise if the patient needs a Lovenox bridge.   Please  route the Coumadin clearance instructions to [facility] CMA.   Sincerely,    Dr. [place] Cellar

## 2017-03-16 NOTE — Patient Instructions (Addendum)
You have been scheduled for a colonoscopy. Please follow written instructions given to you at your visit today.  Please pick up your prep supplies at the pharmacy within the next 1-3 days. If you use inhalers (even only as needed), please bring them with you on the day of your procedure. Your physician has requested that you go to www.startemmi.com and enter the access code given to you at your visit today. This web site gives a general overview about your procedure. However, you should still follow specific instructions given to you by our office regarding your preparation for the procedure.  We will contact you when we get the Coumadin directions from [place].

## 2017-03-16 NOTE — Progress Notes (Signed)
HPI :  61 y/o male here for a visit to discuss having a colonoscopy. He has not been seen here since 2013. He has a history of DVT on chronic coumadin, last DVT around 2006 to 2008 time frame whch was his third clot. On lifelong coumadin.   No FH of colon cancer. He has some mild constipation at times, he does not take anything. No blood in the stools. No routine abdominal pains. Otherwise feels well without complaints.   Colonoscopy 3.22.13 - 3 small polyps - adenomas, diverticulosis -   Past Medical History:  Diagnosis Date  . Anxiety   . Arthritis   . DVT (deep venous thrombosis) ([place])   . Gout   . Hypertension   . Influenza with pneumonia   . Obesity   . Psychosexual dysfunction with inhibited sexual excitement      Past Surgical History:  Procedure Laterality Date  . HAND SURGERY     right  . TONSILLECTOMY AND ADENOIDECTOMY     Family History  Problem Relation Age of Onset  . Hypertension Mother   . Alzheimer's disease Mother   . Hypertension Father   . Alcohol abuse Father   . Cancer Sister        "male organs"  . Lung cancer Maternal Uncle   . Esophageal cancer Maternal Uncle   . Hypertension Maternal Grandmother   . Alzheimer's disease Maternal Grandmother   . Hypertension Maternal Grandfather   . Hypertension Paternal Grandmother   . Hypertension Paternal Grandfather   . Heart disease Brother 52       MI  . Alzheimer's disease Paternal Aunt   . Colon cancer Neg Hx    Social History  Substance Use Topics  . Smoking status: Never Smoker  . Smokeless tobacco: Never Used  . Alcohol use Yes     Comment: ocass- once or twice a month   Current Outpatient Prescriptions  Medication Sig Dispense Refill  . albuterol (PROAIR HFA) 108 (90 Base) MCG/ACT inhaler Inhale 2 puffs into the lungs every 6 (six) hours as needed for wheezing. 1 Inhaler 2  . ALPRAZolam (XANAX) 0.5 MG tablet Take 0.5 mg by mouth at bedtime as needed.    [name] amLODipine (NORVASC) 5 MG  tablet TAKE 1 TABLET(5 MG) BY MOUTH DAILY 90 tablet 0  . atorvastatin (LIPITOR) 10 MG tablet TAKE 1 TABLET(10 MG) BY MOUTH DAILY 90 tablet 0  . cetirizine (ZYRTEC) 10 MG tablet Take 10 mg by mouth daily.    . fish oil-omega-3 fatty acids 1000 MG capsule Take 1 g by mouth daily.    . Flaxseed, Linseed, 1000 MG CAPS Take 1 capsule by mouth daily.    . fluticasone (FLONASE) 50 MCG/ACT nasal spray Place 2 sprays into both nostrils daily. 16 g 6  . olmesartan (BENICAR) 20 MG tablet Take 1 tablet (20 mg total) by mouth daily. 30 tablet 2  . tadalafil (CIALIS) 20 MG tablet Take 20 mg by mouth daily as needed.    . warfarin (COUMADIN) 5 MG tablet TAKE 1 TABLET BY MOUTH DAILY 90 tablet 0   No current facility-administered medications for this visit.    Allergies  Allergen Reactions  . Penicillins     REACTION: Penicillin     Review of Systems: All systems reviewed and negative except where noted in HPI.   Lab Results  Component Value Date   WBC 10.0 09/09/2016   HGB 14.3 09/09/2016   HCT 41.8 (A) 09/09/2016  MCV 81.4 09/09/2016   PLT 223.0 06/29/2016    Lab Results  Component Value Date   CREATININE 0.92 02/20/2017   BUN 14 02/20/2017   NA 138 02/20/2017   K 3.6 02/20/2017   CL 106 02/20/2017   CO2 26 02/20/2017    Lab Results  Component Value Date   ALT 14 01/09/2017   AST 13 01/09/2017   ALKPHOS 47 01/09/2017   BILITOT 0.9 01/09/2017     Physical Exam: BP 120/70   Pulse 72   Ht 5\' 11"  (1.803 m)   Wt (!) 334 lb 3.2 oz (151.6 kg)   BMI 46.61 kg/m  Constitutional: Pleasant,well-developed, male in no acute distress. HEENT: Normocephalic and atraumatic. Conjunctivae are normal. No scleral icterus. Neck supple.  Cardiovascular: Normal rate, regular rhythm.  Pulmonary/chest: Effort normal and breath sounds normal. No wheezing, rales or rhonchi. Abdominal: Soft, protuberant, nontender.  There are no masses palpable. No hepatomegaly. Extremities: no  edema Lymphadenopathy: No cervical adenopathy noted. Neurological: Alert and oriented to person place and time. Skin: Skin is warm and dry. No rashes noted. Psychiatric: Normal mood and affect. Behavior is normal.   ASSESSMENT AND PLAN: 61 year old male with history of colon adenomas, here to be evaluated for surveillance colonoscopy. He is due for surveillance exam at this time. I discussed risks and benefits of colonoscopy and anesthesia with him, and he wanted to proceed. He is at higher than average risk for bleeding given his anticoagulated state. We will reach out to his primary care determine if he can hold Coumadin for 5 days or to the procedure, and with his history of multiple DVTs, does he warrant Lovenox bridge or not. He was agreeable with the plan as outlined, will instruct him further on his Coumadin once we hear back from his primary physician.  [place] Cellar, MD [facility] Pager [phone]  CC: Carollee Herter, Alferd Apa, *

## 2017-03-20 NOTE — Telephone Encounter (Signed)
The pt will need lovenox bridge 80 mg sq daily  Off coumadin 5 days before procedure  Come in day 3 after to recheck PT / INR

## 2017-03-23 NOTE — Telephone Encounter (Signed)
Called and LM for the patient to advise I got information from Dr. Roma Schanz regarding the Coumadin instructions for the colonoscopy on 05-01-2017. He is to stop the Coumadin 5 days prior to 10-16.  Also someone from the Coumadin clinic there at [facility] will call him about an appointment for the Lovenox injections he will have prior to the colonoscopy.  I spoke to a nurse by the name of Santiago Glad. There number is [phone].

## 2017-03-27 ENCOUNTER — Ambulatory Visit (INDEPENDENT_AMBULATORY_CARE_PROVIDER_SITE_OTHER): Payer: 59

## 2017-03-27 VITALS — BP 132/83 | HR 84

## 2017-03-27 DIAGNOSIS — Z7901 Long term (current) use of anticoagulants: Secondary | ICD-10-CM | POA: Diagnosis not present

## 2017-03-27 DIAGNOSIS — I10 Essential (primary) hypertension: Secondary | ICD-10-CM | POA: Diagnosis not present

## 2017-03-27 LAB — POCT INR: INR: 2.5

## 2017-03-27 NOTE — Progress Notes (Signed)
Pre visit review using our clinic tool,if applicable. No additional management support is needed unless otherwise documented below in the visit note.   Patient in for BP check per order dated 02/05/17 and INR check today.  Patient had BP medication change to Benicar 20mg  which he requested change from Valsartan.   Patient given orders from Dr. Etter Sjogren -Cheri Rous regarding his Lovenox 80 mg bridge and Coumadin hold prior to his endoscopic procedure.   No complaints voiced today. Patiet has taken his BP medications this am.  BP today = 132/83 P= 84  INR today =2.5 Goal is 2.0-3.0  Per Dr. Carollee Herter patient to continue BP medication as ordered. Stop Coumadin 5 days before Endoscopic procedure. Will needLovenox Bridge 80 mg sq  daily.  Return to office for INR check 3 days after restarting Coumadin after procedure.. Call to schedule nurse visit. Letter with instructions  mailed to patent

## 2017-03-27 NOTE — Patient Instructions (Addendum)
Per Dr. Carollee Herter patient to continue BP medication as ordered. Stop Coumadin 5 days before Endoscopic procedure. Will needLovenox Bridge 80 mg sq  daily.  Return to office for INR check 3 days after restarting Coumadin after procedure.. Call to schedule nurse visit. Letter with instructions  mailed to patent.

## 2017-03-27 NOTE — Progress Notes (Signed)
Reviewed  Yvonne R Lowne Chase, DO  

## 2017-03-29 ENCOUNTER — Encounter: Payer: Self-pay | Admitting: Family Medicine

## 2017-03-30 NOTE — Telephone Encounter (Signed)
Normally the dentist will ask [name] if they are concerned -- it depends on what exactly they are doing

## 2017-04-04 NOTE — Telephone Encounter (Signed)
See note from Dr. Roma Schanz, also nurse by the name of Vernie Shanks LPN. Patient saw Coumadin clinic nurse on 03-27-2017. Given instructions on the Lovenox injections he will have prior to the colonosocpy.

## 2017-04-06 ENCOUNTER — Other Ambulatory Visit: Payer: Self-pay | Admitting: Family Medicine

## 2017-04-06 DIAGNOSIS — I10 Essential (primary) hypertension: Secondary | ICD-10-CM

## 2017-04-10 ENCOUNTER — Other Ambulatory Visit: Payer: Self-pay | Admitting: Family Medicine

## 2017-04-13 NOTE — Telephone Encounter (Signed)
The patient was given instructions on the Coumadin from Dr. Roma Schanz, Coumadin clinic.

## 2017-04-17 ENCOUNTER — Other Ambulatory Visit (INDEPENDENT_AMBULATORY_CARE_PROVIDER_SITE_OTHER): Payer: 59

## 2017-04-17 ENCOUNTER — Encounter: Payer: Self-pay | Admitting: Gastroenterology

## 2017-04-17 DIAGNOSIS — E785 Hyperlipidemia, unspecified: Secondary | ICD-10-CM | POA: Diagnosis not present

## 2017-04-17 LAB — COMPREHENSIVE METABOLIC PANEL
ALT: 15 U/L (ref 0–53)
AST: 13 U/L (ref 0–37)
Albumin: 3.8 g/dL (ref 3.5–5.2)
Alkaline Phosphatase: 42 U/L (ref 39–117)
BUN: 13 mg/dL (ref 6–23)
CHLORIDE: 104 meq/L (ref 96–112)
CO2: 27 meq/L (ref 19–32)
CREATININE: 0.9 mg/dL (ref 0.40–1.50)
Calcium: 8.9 mg/dL (ref 8.4–10.5)
GFR: 109.73 mL/min (ref >60.00)
Glucose, Bld: 105 mg/dL — ABNORMAL HIGH (ref 70–99)
POTASSIUM: 3.6 meq/L (ref 3.5–5.1)
SODIUM: 138 meq/L (ref 135–145)
Total Bilirubin: 0.7 mg/dL (ref 0.2–1.2)
Total Protein: 6.5 g/dL (ref 6.0–8.3)

## 2017-04-17 LAB — LIPID PANEL
CHOL/HDL RATIO: 4
Cholesterol: 124 mg/dL (ref 0–200)
HDL: 34.7 mg/dL — ABNORMAL LOW (ref >39.00)
LDL CALC: 73 mg/dL (ref 0–99)
NonHDL: 88.87
Triglycerides: 81 mg/dL (ref 0.0–149.0)
VLDL: 16.2 mg/dL (ref 0.0–40.0)

## 2017-04-23 ENCOUNTER — Other Ambulatory Visit: Payer: Self-pay | Admitting: Family Medicine

## 2017-04-23 DIAGNOSIS — N401 Enlarged prostate with lower urinary tract symptoms: Secondary | ICD-10-CM | POA: Diagnosis not present

## 2017-04-23 DIAGNOSIS — E119 Type 2 diabetes mellitus without complications: Secondary | ICD-10-CM

## 2017-04-23 DIAGNOSIS — E785 Hyperlipidemia, unspecified: Secondary | ICD-10-CM

## 2017-05-01 ENCOUNTER — Ambulatory Visit (AMBULATORY_SURGERY_CENTER): Payer: 59 | Admitting: Gastroenterology

## 2017-05-01 ENCOUNTER — Encounter: Payer: Self-pay | Admitting: Gastroenterology

## 2017-05-01 VITALS — BP 121/73 | HR 73 | Temp 98.9°F | Resp 15 | Ht 71.0 in | Wt 334.0 lb

## 2017-05-01 DIAGNOSIS — D120 Benign neoplasm of cecum: Secondary | ICD-10-CM | POA: Diagnosis not present

## 2017-05-01 DIAGNOSIS — Z86010 Personal history of colonic polyps: Secondary | ICD-10-CM | POA: Diagnosis not present

## 2017-05-01 DIAGNOSIS — D124 Benign neoplasm of descending colon: Secondary | ICD-10-CM | POA: Diagnosis not present

## 2017-05-01 MED ORDER — SODIUM CHLORIDE 0.9 % IV SOLN: 500.0000 mL | INTRAVENOUS | Status: DC

## 2017-05-01 NOTE — Op Note (Signed)
[facility] Patient Name: Vinicio Lynk Procedure Date: 05/01/2017 2:50 PM MRN: [id number] Endoscopist: Remo Lipps P. Armbruster MD, MD Age: 61 Referring MD:  Date of Birth: 12-25-1955 Gender: Male Account #: [id number] Procedure:                Colonoscopy Indications:              High risk colon cancer surveillance: Personal                            history of colon adenomas Medicines:                Monitored Anesthesia Care Procedure:                Pre-Anesthesia Assessment:                           - Prior to the procedure, a History and Physical                            was performed, and patient medications and                            allergies were reviewed. The patient's tolerance of                            previous anesthesia was also reviewed. The risks                            and benefits of the procedure and the sedation                            options and risks were discussed with the patient.                            All questions were answered, and informed consent                            was obtained. Prior Anticoagulants: The patient has                            taken Coumadin (warfarin), last dose was 5 days                            prior to procedure. ASA Grade Assessment: III - A                            patient with severe systemic disease. After                            reviewing the risks and benefits, the patient was                            deemed in satisfactory condition to undergo the  procedure.                           After obtaining informed consent, the colonoscope                            was passed under direct vision. Throughout the                            procedure, the patient's blood pressure, pulse, and                            oxygen saturations were monitored continuously. The                            Colonoscope was introduced through the anus and         advanced to the the cecum, identified by                            appendiceal orifice and ileocecal valve. The                            colonoscopy was performed without difficulty. The                            patient tolerated the procedure well. The quality                            of the bowel preparation was adequate. The                            ileocecal valve, appendiceal orifice, and rectum                            were photographed. Scope In: 2:54:42 PM Scope Out: 3:12:01 PM Scope Withdrawal Time: 0 hours 15 minutes 21 seconds  Total Procedure Duration: 0 hours 17 minutes 19 seconds  Findings:                 The perianal and digital rectal examinations were                            normal.                           A 3 mm polyp was found in the cecum. The polyp was                            sessile. The polyp was removed with a cold snare.                            Resection and retrieval were complete.                           A 3 mm polyp was found in the descending  colon. The                            polyp was sessile. The polyp was removed with a                            cold snare. Resection and retrieval were complete.                           Internal hemorrhoids were found during                            retroflexion. The hemorrhoids were small.                           The exam was otherwise without abnormality. Complications:            No immediate complications. Estimated blood loss:                            Minimal. Estimated Blood Loss:     Estimated blood loss was minimal. Impression:               - One 3 mm polyp in the cecum, removed with a cold                            snare. Resected and retrieved.                           - One 3 mm polyp in the descending colon, removed                            with a cold snare. Resected and retrieved.                           - Internal hemorrhoids.                           - The  examination was otherwise normal. Recommendation:           - Patient has a contact number available for                            emergencies. The signs and symptoms of potential                            delayed complications were discussed with the                            patient. Return to normal activities tomorrow.                            Written discharge instructions were provided to the                            patient.                           -  Resume previous diet.                           - Continue present medications.                           - Resume Coumadin tonight                           - Await pathology results.                           - Repeat colonoscopy is recommended for                            surveillance. The colonoscopy date will be                            determined after pathology results from today's                            exam become available for review. Remo Lipps P. Armbruster MD, MD 05/01/2017 3:16:06 PM This report has been signed electronically.

## 2017-05-01 NOTE — Patient Instructions (Signed)
YOU HAD AN ENDOSCOPIC PROCEDURE TODAY AT [facility]:   Refer to the procedure report that was given to you for any specific questions about what was found during the examination.  If the procedure report does not answer your questions, please call your gastroenterologist to clarify.  If you requested that your care partner not be given the details of your procedure findings, then the procedure report has been included in a sealed envelope for you to review at your convenience later.  YOU SHOULD EXPECT: Some feelings of bloating in the abdomen. Passage of more gas than usual.  Walking can help get rid of the air that was put into your GI tract during the procedure and reduce the bloating. If you had a lower endoscopy (such as a colonoscopy or flexible sigmoidoscopy) you may notice spotting of blood in your stool or on the toilet paper. If you underwent a bowel prep for your procedure, you may not have a normal bowel movement for a few days.  Please Note:  You might notice some irritation and congestion in your nose or some drainage.  This is from the oxygen used during your procedure.  There is no need for concern and it should clear up in a day or so.  SYMPTOMS TO REPORT IMMEDIATELY:   Following lower endoscopy (colonoscopy or flexible sigmoidoscopy):  Excessive amounts of blood in the stool  Significant tenderness or worsening of abdominal pains  Swelling of the abdomen that is new, acute  Fever of 100F or higher   For urgent or emergent issues, a gastroenterologist can be reached at any hour by calling [phone].   DIET:  We do recommend a small meal at first, but then you may proceed to your regular diet.  Drink plenty of fluids but you should avoid alcoholic beverages for 24 hours.  ACTIVITY:  You should plan to take it easy for the rest of today and you should NOT DRIVE or use heavy machinery until tomorrow (because of the sedation medicines used during the test).     FOLLOW UP: Our staff will call the number listed on your records the next business day following your procedure to check on you and address any questions or concerns that you may have regarding the information given to you following your procedure. If we do not reach you, we will leave a message.  However, if you are feeling well and you are not experiencing any problems, there is no need to return our call.  We will assume that you have returned to your regular daily activities without incident.  If any biopsies were taken you will be contacted by phone or by letter within the next 1-3 weeks.  Please call us at [phone] if you have not heard about the biopsies in 3 weeks.    SIGNATURES/CONFIDENTIALITY: You and/or your care partner have signed paperwork which will be entered into your electronic medical record.  These signatures attest to the fact that that the information above on your After Visit Summary has been reviewed and is understood.  Full responsibility of the confidentiality of this discharge information lies with you and/or your care-partner.  Resume Coumadin and remainder of medications. Today,information given on polyps and hemorrhoids.

## 2017-05-01 NOTE — Progress Notes (Signed)
Spontaneous respirations throughout. VSS. Resting comfortably. To PACU on room air. Report to  RN. 

## 2017-05-01 NOTE — Progress Notes (Signed)
Pt's states no medical or surgical changes since previsit or office visit. 

## 2017-05-01 NOTE — Progress Notes (Signed)
Called to room to assist during endoscopic procedure.  Patient ID and intended procedure confirmed with present staff. Received instructions for my participation in the procedure from the performing physician.  

## 2017-05-02 ENCOUNTER — Telehealth: Payer: Self-pay | Admitting: *Deleted

## 2017-05-02 NOTE — Telephone Encounter (Signed)
  Follow up Call-  Call back number 05/01/2017  Post procedure Call Back phone  # [phone]  Permission to leave phone message Yes  Some recent data might be hidden     Patient questions:  Do you have a fever, pain , or abdominal swelling? No. Pain Score  0 *  Have you tolerated food without any problems? Yes.    Have you been able to return to your normal activities? Yes.    Do you have any questions about your discharge instructions: Diet   No. Medications  No. Follow up visit  No.  Do you have questions or concerns about your Care? No.  Actions: * If pain score is 4 or above: No action needed, pain <4.

## 2017-05-03 ENCOUNTER — Encounter: Payer: Self-pay | Admitting: Gastroenterology

## 2017-05-04 ENCOUNTER — Other Ambulatory Visit: Payer: Self-pay | Admitting: Family Medicine

## 2017-05-08 ENCOUNTER — Ambulatory Visit (INDEPENDENT_AMBULATORY_CARE_PROVIDER_SITE_OTHER): Payer: 59 | Admitting: Family Medicine

## 2017-05-08 ENCOUNTER — Encounter: Payer: Self-pay | Admitting: Family Medicine

## 2017-05-08 VITALS — BP 138/88 | HR 78 | Temp 98.8°F | Ht 71.0 in | Wt 336.0 lb

## 2017-05-08 DIAGNOSIS — Z23 Encounter for immunization: Secondary | ICD-10-CM | POA: Diagnosis not present

## 2017-05-08 DIAGNOSIS — I82401 Acute embolism and thrombosis of unspecified deep veins of right lower extremity: Secondary | ICD-10-CM | POA: Diagnosis not present

## 2017-05-08 NOTE — Assessment & Plan Note (Signed)
Flu shot given

## 2017-05-08 NOTE — Patient Instructions (Signed)
Coumadin--- INR 1.4 Take 2, 5 mg tab today and tomorrow and rto next week for recheck

## 2017-05-08 NOTE — Progress Notes (Signed)
Subjective:    Patient ID: Daniel Powers, male    DOB: February 06, 1956, 61 y.o.   MRN: [id number]  HPI  Patient here for coumadin check.  Pt never got the lovenox --- he is taking coumadin 5 mg daily inr today 1.4  Past Medical History:  Diagnosis Date  . Anxiety   . Arthritis   . DVT (deep venous thrombosis) ([place])   . Gout   . Hypertension   . Influenza with pneumonia   . Obesity   . Psychosexual dysfunction with inhibited sexual excitement     Review of Systems  Constitutional: Negative for appetite change, diaphoresis, fatigue and unexpected weight change.  Eyes: Negative for pain, redness and visual disturbance.  Respiratory: Negative for cough, chest tightness, shortness of breath and wheezing.   Cardiovascular: Negative for chest pain, palpitations and leg swelling.  Endocrine: Negative for cold intolerance, heat intolerance, polydipsia, polyphagia and polyuria.  Genitourinary: Negative for difficulty urinating, dysuria and frequency.  Neurological: Negative for dizziness, light-headedness, numbness and headaches.       Objective:    Physical Exam  Constitutional: He is oriented to person, place, and time. Vital signs are normal. He appears well-developed and well-nourished. He is sleeping.  HENT:  Head: Normocephalic and atraumatic.  Mouth/Throat: Oropharynx is clear and moist.  Eyes: Pupils are equal, round, and reactive to light. EOM are normal.  Neck: Normal range of motion. Neck supple. No thyromegaly present.  Cardiovascular: Normal rate and regular rhythm.   No murmur heard. Pulmonary/Chest: Effort normal and breath sounds normal. No respiratory distress. He has no wheezes. He has no rales. He exhibits no tenderness.  Musculoskeletal: He exhibits no edema or tenderness.  Neurological: He is alert and oriented to person, place, and time.  Skin: Skin is warm and dry.  Psychiatric: He has a normal mood and affect. His behavior is normal. Judgment and thought  content normal.  Nursing note and vitals reviewed.   BP 138/88   Pulse 78   Temp 98.8 F (37.1 C) (Oral)   Ht 5\' 11"  (1.803 m)   Wt (!) 336 lb (152.4 kg)   SpO2 98%   BMI 46.86 kg/m  Wt Readings from Last 3 Encounters:  05/08/17 (!) 336 lb (152.4 kg)  05/01/17 (!) 334 lb (151.5 kg)  03/16/17 (!) 334 lb 3.2 oz (151.6 kg)     Lab Results  Component Value Date   WBC 10.0 09/09/2016   HGB 14.3 09/09/2016   HCT 41.8 (A) 09/09/2016   PLT 223.0 06/29/2016   GLUCOSE 105 (H) 04/17/2017   CHOL 124 04/17/2017   TRIG 81.0 04/17/2017   HDL 34.70 (L) 04/17/2017   LDLCALC 73 04/17/2017   ALT 15 04/17/2017   AST 13 04/17/2017   NA 138 04/17/2017   K 3.6 04/17/2017   CL 104 04/17/2017   CREATININE 0.90 04/17/2017   BUN 13 04/17/2017   CO2 27 04/17/2017   TSH 1.04 06/29/2016   PSA 0.83 06/29/2016   INR 2.5 03/27/2017   MICROALBUR 1.3 10/22/2012   INR 1.4     Assessment & Plan:   Problem List Items Addressed This Visit      Unprioritized   Need for influenza vaccination    Flu shot given      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

## 2017-05-08 NOTE — Assessment & Plan Note (Signed)
Take coumdin 5 mg  2 po today and tomorrow and then go back to 1 a day  Recheck next week

## 2017-05-09 ENCOUNTER — Other Ambulatory Visit: Payer: Self-pay | Admitting: Family Medicine

## 2017-05-15 ENCOUNTER — Ambulatory Visit (INDEPENDENT_AMBULATORY_CARE_PROVIDER_SITE_OTHER): Payer: 59 | Admitting: Behavioral Health

## 2017-05-15 DIAGNOSIS — Z7901 Long term (current) use of anticoagulants: Secondary | ICD-10-CM | POA: Diagnosis not present

## 2017-05-15 LAB — POCT INR: INR: 2.2

## 2017-05-15 NOTE — Patient Instructions (Signed)
Per Dr. Carollee Herter: Continue taking Coumadin 5 mg daily. Return in 6 weeks for INR check.

## 2017-05-15 NOTE — Progress Notes (Signed)
Pre visit review using our clinic review tool, if applicable. No additional management support is needed unless otherwise documented below in the visit note.  Patient presents in clinic for INR check. He voiced adherence to medication & regimen. All patient findings were negative. Today's INR reading was 2.2.  Per Dr. Carollee Herter: Continue taking Coumadin 5 mg daily. Return in 6 weeks for INR check.  Informed patient of the provider's recommendations. He verbalized understanding. Next appointment 06/28/17 at 3:00 PM.

## 2017-06-04 ENCOUNTER — Other Ambulatory Visit: Payer: Self-pay | Admitting: Family Medicine

## 2017-06-04 DIAGNOSIS — E785 Hyperlipidemia, unspecified: Secondary | ICD-10-CM

## 2017-06-05 ENCOUNTER — Other Ambulatory Visit: Payer: Self-pay | Admitting: Family Medicine

## 2017-06-28 ENCOUNTER — Encounter: Payer: Self-pay | Admitting: Family Medicine

## 2017-06-28 ENCOUNTER — Ambulatory Visit (INDEPENDENT_AMBULATORY_CARE_PROVIDER_SITE_OTHER): Payer: 59 | Admitting: Family Medicine

## 2017-06-28 VITALS — BP 130/80 | HR 90 | Temp 98.1°F | Ht 71.0 in | Wt 331.0 lb

## 2017-06-28 DIAGNOSIS — Z0000 Encounter for general adult medical examination without abnormal findings: Secondary | ICD-10-CM | POA: Diagnosis not present

## 2017-06-28 DIAGNOSIS — I10 Essential (primary) hypertension: Secondary | ICD-10-CM | POA: Diagnosis not present

## 2017-06-28 DIAGNOSIS — Z23 Encounter for immunization: Secondary | ICD-10-CM | POA: Diagnosis not present

## 2017-06-28 DIAGNOSIS — E785 Hyperlipidemia, unspecified: Secondary | ICD-10-CM | POA: Diagnosis not present

## 2017-06-28 DIAGNOSIS — Z7901 Long term (current) use of anticoagulants: Secondary | ICD-10-CM

## 2017-06-28 LAB — POCT INR: INR: 2.3

## 2017-06-28 NOTE — Patient Instructions (Signed)

## 2017-06-28 NOTE — Progress Notes (Signed)
Subjective:  I acted as a Education administrator for [employer], [place]   Patient ID: Daniel Powers, male    DOB: 09/22/1955, 61 y.o.   MRN: [id number]  Chief Complaint  Patient presents with  . Annual Exam  . Skin Discoloration    R leg spotting     HPI  Patient is in today for annual physical. He also c/o  spot on R leg that her recently noticed.  No other complaints.  Pt sees urology regularly.   Patient Care Team: Carollee Herter, Alferd Apa, DO as PCP - General Irine Seal, MD as Attending Physician (Urology) Irine Seal, MD as Attending Physician (Urology) Donell Sievert, MD as Referring Physician (Ophthalmology)   Past Medical History:  Diagnosis Date  . Anxiety   . Arthritis   . DVT (deep venous thrombosis) ([place])   . Gout   . Hypertension   . Influenza with pneumonia   . Obesity   . Psychosexual dysfunction with inhibited sexual excitement     Past Surgical History:  Procedure Laterality Date  . HAND SURGERY     right  . TONSILLECTOMY AND ADENOIDECTOMY      Family History  Problem Relation Age of Onset  . Hypertension Mother   . Alzheimer's disease Mother   . Hypertension Father   . Alcohol abuse Father   . Cancer Sister        "male organs"  . Lung cancer Maternal Uncle   . Esophageal cancer Maternal Uncle   . Hypertension Maternal Grandmother   . Alzheimer's disease Maternal Grandmother   . Hypertension Maternal Grandfather   . Hypertension Paternal Grandmother   . Hypertension Paternal Grandfather   . Heart disease Brother 53       MI  . Alzheimer's disease Paternal Aunt   . Colon cancer Neg Hx     Social History   Socioeconomic History  . Marital status: Married    Spouse name: [name]  . Number of children: 1  . Years of education: Not on file  . Highest education level: Not on file  Social Needs  . Financial resource strain: Not on file  . Food insecurity - worry: Not on file  . Food insecurity - inability: Not on file  . Transportation needs -  medical: Not on file  . Transportation needs - non-medical: Not on file  Occupational History  . 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 Smoking status: Never Smoker  . Smokeless tobacco: Never Used  Substance and Sexual Activity  . Alcohol use: Yes    Comment: ocass- once or twice a month  . Drug use: No  . Sexual activity: Yes    Partners: Female  Other Topics Concern  . Not on file  Social History Narrative   Exercise --a little-- walking     Outpatient Medications Prior to Visit  Medication Sig Dispense Refill  . albuterol (PROAIR HFA) 108 (90 Base) MCG/ACT inhaler Inhale 2 puffs into the lungs every 6 (six) hours as needed for wheezing. 1 Inhaler 2  . ALPRAZolam (XANAX) 0.5 MG tablet Take 0.5 mg by mouth at bedtime as needed.    [name] amLODipine (NORVASC) 5 MG tablet TAKE 1 TABLET(5 MG) BY MOUTH DAILY 90 tablet 0  . atorvastatin (LIPITOR) 10 MG tablet TAKE 1 TABLET(10 MG) BY MOUTH DAILY 90 tablet 0  . cetirizine (ZYRTEC) 10 MG tablet Take 10 mg by mouth daily.    [name]  fish oil-omega-3 fatty acids 1000 MG capsule Take 1 g by mouth daily.    . Flaxseed, Linseed, 1000 MG CAPS Take 1 capsule by mouth daily.    . fluticasone (FLONASE) 50 MCG/ACT nasal spray Place 2 sprays into both nostrils daily. 16 g 6  . olmesartan (BENICAR) 20 MG tablet TAKE 1 TABLET(20 MG) BY MOUTH DAILY 30 tablet 0  . tadalafil (CIALIS) 20 MG tablet Take 20 mg by mouth daily as needed.    . warfarin (COUMADIN) 5 MG tablet TAKE 1 TABLET BY MOUTH DAILY 90 tablet 0   Facility-Administered Medications Prior to Visit  Medication Dose Route Frequency Provider Last Rate Last Dose  . 0.9 %  sodium chloride infusion  500 mL Intravenous Continuous Armbruster, [name], MD        Allergies  Allergen Reactions  . Penicillins     REACTION: Penicillin    Review of Systems  Constitutional: Negative for chills, fever and malaise/fatigue.  HENT: Negative for congestion and hearing loss.     Eyes: Negative for blurred vision and discharge.  Respiratory: Negative for cough, sputum production and shortness of breath.   Cardiovascular: Negative for chest pain, palpitations and leg swelling.  Gastrointestinal: Negative for abdominal pain, blood in stool, constipation, diarrhea, heartburn, nausea and vomiting.  Genitourinary: Negative for dysuria, frequency, hematuria and urgency.  Musculoskeletal: Negative for back pain, falls and myalgias.  Skin: Negative for rash.  Neurological: Negative for dizziness, sensory change, loss of consciousness, weakness and headaches.  Endo/Heme/Allergies: Negative for environmental allergies. Does not bruise/bleed easily.  Psychiatric/Behavioral: Negative for depression and suicidal ideas. The patient is not nervous/anxious and does not have insomnia.        Objective:    Physical Exam  Constitutional: He is oriented to person, place, and time. He appears well-developed and well-nourished. No distress.  HENT:  Head: Normocephalic and atraumatic.  Right Ear: External ear normal.  Left Ear: External ear normal.  Nose: Nose normal.  Mouth/Throat: Oropharynx is clear and moist. No oropharyngeal exudate.  Eyes: Conjunctivae and EOM are normal. Pupils are equal, round, and reactive to light. Right eye exhibits no discharge. Left eye exhibits no discharge.  Neck: Normal range of motion. Neck supple. No JVD present. No thyromegaly present.  Cardiovascular: Normal rate, regular rhythm and intact distal pulses. Exam reveals no gallop and no friction rub.  No murmur heard. Pulmonary/Chest: Effort normal and breath sounds normal. No respiratory distress. He has no wheezes. He has no rales. He exhibits no tenderness.  Abdominal: Soft. Bowel sounds are normal. He exhibits no distension and no mass. There is no tenderness. There is no rebound and no guarding.  Genitourinary:  Genitourinary Comments: urology  Musculoskeletal: Normal range of motion. He  exhibits no edema or tenderness.  Lymphadenopathy:    He has no cervical adenopathy.  Neurological: He is alert and oriented to person, place, and time. He displays normal reflexes. He exhibits normal muscle tone.  Skin: Skin is warm and dry. No rash noted. He is not diaphoretic. No erythema. No pallor.  Psychiatric: He has a normal mood and affect. His behavior is normal. Judgment and thought content normal.  Nursing note and vitals reviewed.   BP 130/80   Pulse 90   Temp 98.1 F (36.7 C) (Oral)   Ht 5\' 11"  (1.803 m)   Wt (!) 331 lb (150.1 kg)   SpO2 97%   BMI 46.17 kg/m  Wt Readings from Last 3 Encounters:  06/28/17 (!) 331  lb (150.1 kg)  05/08/17 (!) 336 lb (152.4 kg)  05/01/17 (!) 334 lb (151.5 kg)   BP Readings from Last 3 Encounters:  06/28/17 130/80  05/08/17 138/88  05/01/17 121/73     Immunization History  Administered Date(s) Administered  . Influenza Split 05/04/2011  . Influenza Whole 07/26/2010  . Influenza,inj,Quad PF,6+ Mos 06/03/2013, 06/18/2014, 07/29/2015, 06/29/2016, 05/08/2017  . Pneumococcal Polysaccharide-23 09/12/2010  . Td 09/06/2009  . Zoster 11/26/2014  . Zoster Recombinat (Shingrix) 06/28/2017    Health Maintenance  Topic Date Due  . HEMOGLOBIN A1C  12-16-1955  . FOOT EXAM  08/20/1964  . OPHTHALMOLOGY EXAM  08/20/1964  . PNEUMOCOCCAL POLYSACCHARIDE VACCINE (2) 09/13/2015  . TETANUS/TDAP  09/07/2019  . COLONOSCOPY  05/01/2022  . INFLUENZA VACCINE  Completed  . Hepatitis C Screening  Completed  . HIV Screening  Completed    Lab Results  Component Value Date   WBC 10.0 09/09/2016   HGB 14.3 09/09/2016   HCT 41.8 (A) 09/09/2016   PLT 223.0 06/29/2016   GLUCOSE 105 (H) 04/17/2017   CHOL 124 04/17/2017   TRIG 81.0 04/17/2017   HDL 34.70 (L) 04/17/2017   LDLCALC 73 04/17/2017   ALT 15 04/17/2017   AST 13 04/17/2017   NA 138 04/17/2017   K 3.6 04/17/2017   CL 104 04/17/2017   CREATININE 0.90 04/17/2017   BUN 13 04/17/2017   CO2  27 04/17/2017   TSH 1.04 06/29/2016   PSA 0.83 06/29/2016   INR 2.3 06/28/2017   MICROALBUR 1.3 10/22/2012    Lab Results  Component Value Date   TSH 1.04 06/29/2016   Lab Results  Component Value Date   WBC 10.0 09/09/2016   HGB 14.3 09/09/2016   HCT 41.8 (A) 09/09/2016   MCV 81.4 09/09/2016   PLT 223.0 06/29/2016   Lab Results  Component Value Date   NA 138 04/17/2017   K 3.6 04/17/2017   CO2 27 04/17/2017   GLUCOSE 105 (H) 04/17/2017   BUN 13 04/17/2017   CREATININE 0.90 04/17/2017   BILITOT 0.7 04/17/2017   ALKPHOS 42 04/17/2017   AST 13 04/17/2017   ALT 15 04/17/2017   PROT 6.5 04/17/2017   ALBUMIN 3.8 04/17/2017   CALCIUM 8.9 04/17/2017   GFR 109.73 04/17/2017   Lab Results  Component Value Date   CHOL 124 04/17/2017   Lab Results  Component Value Date   HDL 34.70 (L) 04/17/2017   Lab Results  Component Value Date   LDLCALC 73 04/17/2017   Lab Results  Component Value Date   TRIG 81.0 04/17/2017   Lab Results  Component Value Date   CHOLHDL 4 04/17/2017   No results found for: HGBA1C       Assessment & Plan:   Problem List Items Addressed This Visit      Unprioritized   Essential hypertension    Well controlled, no changes to meds. Encouraged heart healthy diet such as the DASH diet and exercise as tolerated.       Relevant Orders   PSA   TSH   CBC with Differential/Platelet   Lipid panel   Comprehensive metabolic panel   Hyperlipidemia    Tolerating statin, encouraged heart healthy diet, avoid trans fats, minimize simple carbs and saturated fats. Increase exercise as tolerated      Relevant Orders   Lipid panel   Comprehensive metabolic panel   Preventative health care - Primary    ghm utd Check labs See AVS  Relevant Orders   PSA   TSH   CBC with Differential/Platelet   Lipid panel   Comprehensive metabolic panel    Other Visit Diagnoses    Anticoagulated by anticoagulation treatment       Relevant Orders    POCT INR (Completed)   Need for shingles vaccine       Relevant Orders   Varicella-zoster vaccine IM (Shingrix) (Completed)      I am having [name] maintain his fish oil-omega-3 fatty acids, tadalafil, Flaxseed (Linseed), ALPRAZolam, cetirizine, fluticasone, albuterol, amLODipine, warfarin, atorvastatin, and olmesartan. We will continue to administer sodium chloride.  No orders of the defined types were placed in this encounter.   CMA served as Education administrator during this visit. History, Physical and Plan performed by medical provider. Documentation and orders reviewed and attested to.  Ann Held, DO

## 2017-06-28 NOTE — Assessment & Plan Note (Signed)
Well controlled, no changes to meds. Encouraged heart healthy diet such as the DASH diet and exercise as tolerated.  °

## 2017-06-28 NOTE — Assessment & Plan Note (Signed)
ghm utd Check labs See AVS 

## 2017-06-28 NOTE — Assessment & Plan Note (Signed)
Tolerating statin, encouraged heart healthy diet, avoid trans fats, minimize simple carbs and saturated fats. Increase exercise as tolerated 

## 2017-06-29 DIAGNOSIS — H04123 Dry eye syndrome of bilateral lacrimal glands: Secondary | ICD-10-CM | POA: Diagnosis not present

## 2017-06-29 DIAGNOSIS — H2511 Age-related nuclear cataract, right eye: Secondary | ICD-10-CM | POA: Diagnosis not present

## 2017-06-29 LAB — LIPID PANEL
CHOLESTEROL: 123 mg/dL (ref 0–200)
HDL: 35.4 mg/dL — ABNORMAL LOW (ref >39.00)
LDL Cholesterol: 72 mg/dL (ref 0–99)
NonHDL: 87.1
TRIGLYCERIDES: 74 mg/dL (ref 0.0–149.0)
Total CHOL/HDL Ratio: 3
VLDL: 14.8 mg/dL (ref 0.0–40.0)

## 2017-06-29 LAB — COMPREHENSIVE METABOLIC PANEL
ALBUMIN: 4 g/dL (ref 3.5–5.2)
ALT: 15 U/L (ref 0–53)
AST: 15 U/L (ref 0–37)
Alkaline Phosphatase: 45 U/L (ref 39–117)
BILIRUBIN TOTAL: 1.1 mg/dL (ref 0.2–1.2)
BUN: 15 mg/dL (ref 6–23)
CALCIUM: 9 mg/dL (ref 8.4–10.5)
CO2: 27 mEq/L (ref 19–32)
CREATININE: 0.91 mg/dL (ref 0.40–1.50)
Chloride: 107 mEq/L (ref 96–112)
GFR: 108.27 mL/min (ref >60.00)
Glucose, Bld: 79 mg/dL (ref 70–99)
Potassium: 4 mEq/L (ref 3.5–5.1)
Sodium: 139 mEq/L (ref 135–145)
Total Protein: 6.7 g/dL (ref 6.0–8.3)

## 2017-06-29 LAB — CBC WITH DIFFERENTIAL/PLATELET
BASOS PCT: 0.9 % (ref 0.0–3.0)
Basophils Absolute: 0.1 10*3/uL (ref 0.0–0.1)
EOS ABS: 0.1 10*3/uL (ref 0.0–0.7)
Eosinophils Relative: 1.3 % (ref 0.0–5.0)
HCT: 45 % (ref 39.0–52.0)
Hemoglobin: 14.6 g/dL (ref 13.0–17.0)
Lymphocytes Relative: 32.7 % (ref 12.0–46.0)
Lymphs Abs: 2.6 10*3/uL (ref 0.7–4.0)
MCHC: 32.5 g/dL (ref 30.0–36.0)
MCV: 84.8 fl (ref 78.0–100.0)
MONO ABS: 0.6 10*3/uL (ref 0.1–1.0)
Monocytes Relative: 7.6 % (ref 3.0–12.0)
NEUTROS ABS: 4.6 10*3/uL (ref 1.4–7.7)
NEUTROS PCT: 57.5 % (ref 43.0–77.0)
PLATELETS: 221 10*3/uL (ref 150.0–400.0)
RBC: 5.31 Mil/uL (ref 4.22–5.81)
RDW: 15 % (ref 11.5–15.5)
WBC: 7.9 10*3/uL (ref 4.0–10.5)

## 2017-06-29 LAB — TSH: TSH: 1.38 u[IU]/mL (ref 0.35–4.50)

## 2017-06-29 LAB — PSA: PSA: 1.31 ng/mL (ref 0.10–4.00)

## 2017-07-03 ENCOUNTER — Other Ambulatory Visit: Payer: Self-pay | Admitting: Family Medicine

## 2017-07-03 DIAGNOSIS — I10 Essential (primary) hypertension: Secondary | ICD-10-CM

## 2017-07-03 DIAGNOSIS — R972 Elevated prostate specific antigen [PSA]: Secondary | ICD-10-CM

## 2017-07-17 ENCOUNTER — Other Ambulatory Visit: Payer: Self-pay | Admitting: Family Medicine

## 2017-07-17 DIAGNOSIS — I82401 Acute embolism and thrombosis of unspecified deep veins of right lower extremity: Secondary | ICD-10-CM

## 2017-07-23 DIAGNOSIS — R972 Elevated prostate specific antigen [PSA]: Secondary | ICD-10-CM | POA: Diagnosis not present

## 2017-08-02 DIAGNOSIS — R972 Elevated prostate specific antigen [PSA]: Secondary | ICD-10-CM | POA: Diagnosis not present

## 2017-08-30 ENCOUNTER — Other Ambulatory Visit: Payer: Self-pay | Admitting: Family Medicine

## 2017-08-30 DIAGNOSIS — E785 Hyperlipidemia, unspecified: Secondary | ICD-10-CM

## 2017-09-04 ENCOUNTER — Ambulatory Visit (INDEPENDENT_AMBULATORY_CARE_PROVIDER_SITE_OTHER): Payer: 59 | Admitting: Family Medicine

## 2017-09-04 DIAGNOSIS — Z7901 Long term (current) use of anticoagulants: Secondary | ICD-10-CM | POA: Diagnosis not present

## 2017-09-04 DIAGNOSIS — Z86718 Personal history of other venous thrombosis and embolism: Secondary | ICD-10-CM | POA: Diagnosis not present

## 2017-09-04 LAB — POCT INR: INR: 2.2

## 2017-09-04 NOTE — Progress Notes (Signed)
Pre visit review using our clinic review tool, if applicable. No additional management support is needed unless otherwise documented below in the visit note.   Pt here for INR check.  He is currently maintained on Coumadin 5mg  once a day.  Last INR on 06/28/17 = 2.3   INR today = 2.2.  Advised pt per verbal from Dr Carollee Herter to continue taking 5mg  once a day and return in 1 month for INR check.    Appointment scheduled for 10/02/17 at 10am.    reviewed Ann Held, DO

## 2017-09-04 NOTE — Patient Instructions (Addendum)
Please continue Coumadin 5mg  once a day and repeat your INR level in 1 month. This is scheduled for 10/02/17 at 10am.

## 2017-10-02 ENCOUNTER — Ambulatory Visit: Payer: 59

## 2017-10-04 ENCOUNTER — Ambulatory Visit (INDEPENDENT_AMBULATORY_CARE_PROVIDER_SITE_OTHER): Payer: 59

## 2017-10-04 DIAGNOSIS — Z7901 Long term (current) use of anticoagulants: Secondary | ICD-10-CM | POA: Diagnosis not present

## 2017-10-04 LAB — POCT INR: INR: 2.4

## 2017-10-04 NOTE — Progress Notes (Signed)
noted 

## 2017-10-04 NOTE — Progress Notes (Signed)
Currently taking: 5mg  once a day  Goal: 2.0-3.0  Last INR: 2.2  Today 2.4  Advised pt per verbal from Dr Carollee Herter to continue taking 5mg  once a day and return in 1 month for INR check.     Appt made for 11/08/17

## 2017-10-11 ENCOUNTER — Telehealth: Payer: Self-pay | Admitting: *Deleted

## 2017-10-11 NOTE — Telephone Encounter (Signed)
Spoke with pt. He will get 2nd Shingrix vaccine at INR check on 11/08/17.

## 2017-11-01 ENCOUNTER — Other Ambulatory Visit: Payer: Self-pay | Admitting: Family Medicine

## 2017-11-01 DIAGNOSIS — I82401 Acute embolism and thrombosis of unspecified deep veins of right lower extremity: Secondary | ICD-10-CM

## 2017-11-08 ENCOUNTER — Ambulatory Visit (INDEPENDENT_AMBULATORY_CARE_PROVIDER_SITE_OTHER): Payer: 59

## 2017-11-08 DIAGNOSIS — Z23 Encounter for immunization: Secondary | ICD-10-CM | POA: Diagnosis not present

## 2017-11-08 DIAGNOSIS — Z7901 Long term (current) use of anticoagulants: Secondary | ICD-10-CM

## 2017-11-08 LAB — POCT INR: INR: 2.2

## 2017-11-08 NOTE — Addendum Note (Signed)
Addended by: Magdalene Molly A on: 11/08/2017 09:50 AM   Modules accepted: Orders

## 2017-11-08 NOTE — Progress Notes (Signed)
Noted  Shainna Faux R Lowne Chase, DO  

## 2017-11-08 NOTE — Progress Notes (Addendum)
Currently taking: 5mg  once a day  Goal: 2.0-3.0  Last INR: 2.4  Today 2.2  Per Dr. Nani Ravens (DOD): Continue same dosage. Return in 1 month.   Patient agreed   Patient also received his 2nd shingrix vaccine

## 2017-12-11 ENCOUNTER — Ambulatory Visit (INDEPENDENT_AMBULATORY_CARE_PROVIDER_SITE_OTHER): Payer: 59

## 2017-12-11 DIAGNOSIS — Z7901 Long term (current) use of anticoagulants: Secondary | ICD-10-CM | POA: Diagnosis not present

## 2017-12-11 LAB — POCT INR: INR: 2.7 (ref 2.0–3.0)

## 2017-12-11 NOTE — Progress Notes (Signed)
Pre visit review using our clinic tool,if applicable. No additional management support is needed unless otherwise documented below in the visit note.   Pt here for INR check per Dr. Roma Schanz  Goal INR =2.0-3.0  Last INR =2.2  Pt currently takes Coumadin 5 mg daily  Pt denies recent antibiotics, no dietary changes and no unusual bruising / bleeding.  INR today = 2.7  Pt advised per Dr.Lowne-Chase to continue current dosage of Coumadin daily and return for INR check in 1 month. Patient agreed appointment scheduled.

## 2017-12-11 NOTE — Progress Notes (Signed)
Reviewed  Yvonne R Lowne Chase, DO  

## 2017-12-20 DIAGNOSIS — R972 Elevated prostate specific antigen [PSA]: Secondary | ICD-10-CM | POA: Diagnosis not present

## 2017-12-27 ENCOUNTER — Other Ambulatory Visit: Payer: Self-pay | Admitting: Family Medicine

## 2017-12-27 DIAGNOSIS — I10 Essential (primary) hypertension: Secondary | ICD-10-CM

## 2018-01-10 ENCOUNTER — Ambulatory Visit (INDEPENDENT_AMBULATORY_CARE_PROVIDER_SITE_OTHER): Payer: 59

## 2018-01-10 DIAGNOSIS — Z7901 Long term (current) use of anticoagulants: Secondary | ICD-10-CM | POA: Diagnosis not present

## 2018-01-10 LAB — POCT INR: INR: 2.4 (ref 2.0–3.0)

## 2018-01-10 NOTE — Progress Notes (Signed)
Pre visit review using our clinic tool,if applicable. No additional management support is needed unless otherwise documented below in the visit note.   Pt here for INR check per order from Dr. Carollee Herter dated 12/11/17.  Goal INR = 2.0-3.0  Last INR =2.7  Pt currently takes Coumadin 5 mg daily.  Pt denies recent antibiotics, no dietary changes and no unusual bruising / bleeding.  INR today = 2.4  Pt advised per Dr. Carollee Herter to continue Coumadin 5 mg daily and return in 1 month for INR re-check. Patient agreed appointment scheduled.

## 2018-01-21 ENCOUNTER — Ambulatory Visit: Payer: 59 | Admitting: Family Medicine

## 2018-01-23 DIAGNOSIS — R972 Elevated prostate specific antigen [PSA]: Secondary | ICD-10-CM | POA: Diagnosis not present

## 2018-02-01 ENCOUNTER — Encounter: Payer: Self-pay | Admitting: Family Medicine

## 2018-02-01 ENCOUNTER — Ambulatory Visit (INDEPENDENT_AMBULATORY_CARE_PROVIDER_SITE_OTHER)
Admission: RE | Admit: 2018-02-01 | Discharge: 2018-02-01 | Disposition: A | Discharge status: Home / Self Care | Payer: 59 | Source: Ambulatory Visit | Attending: Family Medicine | Admitting: Family Medicine

## 2018-02-01 ENCOUNTER — Ambulatory Visit: Payer: 59 | Admitting: Family Medicine

## 2018-02-01 VITALS — BP 138/78 | HR 86 | Temp 98.2°F | Ht 71.0 in | Wt 337.0 lb

## 2018-02-01 DIAGNOSIS — E119 Type 2 diabetes mellitus without complications: Secondary | ICD-10-CM | POA: Diagnosis not present

## 2018-02-01 DIAGNOSIS — E785 Hyperlipidemia, unspecified: Secondary | ICD-10-CM | POA: Diagnosis not present

## 2018-02-01 DIAGNOSIS — M549 Dorsalgia, unspecified: Secondary | ICD-10-CM | POA: Diagnosis not present

## 2018-02-01 DIAGNOSIS — M546 Pain in thoracic spine: Secondary | ICD-10-CM | POA: Diagnosis not present

## 2018-02-01 DIAGNOSIS — Z7901 Long term (current) use of anticoagulants: Secondary | ICD-10-CM | POA: Diagnosis not present

## 2018-02-01 DIAGNOSIS — R079 Chest pain, unspecified: Secondary | ICD-10-CM | POA: Diagnosis not present

## 2018-02-01 DIAGNOSIS — I10 Essential (primary) hypertension: Secondary | ICD-10-CM | POA: Diagnosis not present

## 2018-02-01 DIAGNOSIS — I82401 Acute embolism and thrombosis of unspecified deep veins of right lower extremity: Secondary | ICD-10-CM

## 2018-02-01 LAB — COMPREHENSIVE METABOLIC PANEL
ALT: 16 U/L (ref 0–53)
AST: 11 U/L (ref 0–37)
Albumin: 3.9 g/dL (ref 3.5–5.2)
Alkaline Phosphatase: 51 U/L (ref 39–117)
BILIRUBIN TOTAL: 0.5 mg/dL (ref 0.2–1.2)
BUN: 14 mg/dL (ref 6–23)
CO2: 29 meq/L (ref 19–32)
Calcium: 8.9 mg/dL (ref 8.4–10.5)
Chloride: 105 mEq/L (ref 96–112)
Creatinine, Ser: 1.02 mg/dL (ref 0.40–1.50)
GFR: 94.73 mL/min (ref >60.00)
GLUCOSE: 104 mg/dL — AB (ref 70–99)
POTASSIUM: 3.9 meq/L (ref 3.5–5.1)
SODIUM: 139 meq/L (ref 135–145)
TOTAL PROTEIN: 6.9 g/dL (ref 6.0–8.3)

## 2018-02-01 LAB — LIPID PANEL
CHOLESTEROL: 118 mg/dL (ref 0–200)
HDL: 36 mg/dL — ABNORMAL LOW (ref >39.00)
LDL Cholesterol: 66 mg/dL (ref 0–99)
NONHDL: 81.7
Total CHOL/HDL Ratio: 3
Triglycerides: 79 mg/dL (ref 0.0–149.0)
VLDL: 15.8 mg/dL (ref 0.0–40.0)

## 2018-02-01 LAB — HEMOGLOBIN A1C: HEMOGLOBIN A1C: 6.2 % (ref 4.6–6.5)

## 2018-02-01 LAB — POCT INR: INR: 2.6 (ref 2.0–3.0)

## 2018-02-01 NOTE — Assessment & Plan Note (Signed)
Tolerating statin, encouraged heart healthy diet, avoid trans fats, minimize simple carbs and saturated fats. Increase exercise as tolerated 

## 2018-02-01 NOTE — Assessment & Plan Note (Addendum)
hgba1c acceptable, minimize simple carbs. Increase exercise as tolerated. Continue current meds  con't meds   Lab Results  Component Value Date   HGBA1C 6.2 02/01/2018

## 2018-02-01 NOTE — Progress Notes (Addendum)
Patient ID: Daniel Powers, male    DOB: 1956/05/22  Age: 62 y.o. MRN: [id number]    Subjective:  Subjective  HPI Daniel Powers presents for f/u bp, cholesterol and Pt check   Pt c/o mid back pain that radiates to chest   No sob, no dyspepsia, no nv .   Pain does not radiate to neck/ arm, not exertional .   It only hurts when he takes a deep breath.    Review of Systems  Constitutional: Negative.   HENT: Negative for congestion, ear pain, hearing loss, nosebleeds, postnasal drip, rhinorrhea, sinus pressure, sneezing and tinnitus.   Eyes: Negative for photophobia, discharge, itching and visual disturbance.  Respiratory: Negative.   Cardiovascular: Positive for chest pain.  Gastrointestinal: Negative for abdominal distention, abdominal pain, anal bleeding, blood in stool and constipation.  Endocrine: Negative.   Genitourinary: Negative.   Musculoskeletal: Negative.   Skin: Negative.   Allergic/Immunologic: Negative.   Neurological: Negative for dizziness, weakness, light-headedness, numbness and headaches.  Psychiatric/Behavioral: Negative for agitation, confusion, decreased concentration, dysphoric mood, sleep disturbance and suicidal ideas. The patient is not nervous/anxious.     History Past Medical History:  Diagnosis Date  . Anxiety   . Arthritis   . DVT (deep venous thrombosis) ([place])   . Gout   . Hypertension   . Influenza with pneumonia   . Obesity   . Psychosexual dysfunction with inhibited sexual excitement     He has a past surgical history that includes Tonsillectomy and adenoidectomy and Hand surgery.   His family history includes Alcohol abuse in his father; Alzheimer's disease in his maternal grandmother, mother, and paternal aunt; Cancer in his sister; Esophageal cancer in his maternal uncle; Heart disease (age of onset: 64) in his brother; Hypertension in his father, maternal grandfather, maternal grandmother, mother, paternal grandfather, and paternal grandmother;  Lung cancer in his maternal uncle.He reports that he has never smoked. He has never used smokeless tobacco. He reports that he drinks alcohol. He reports that he does not use drugs.  Current Outpatient Medications on File Prior to Visit  Medication Sig Dispense Refill  . albuterol (PROAIR HFA) 108 (90 Base) MCG/ACT inhaler Inhale 2 puffs into the lungs every 6 (six) hours as needed for wheezing. 1 Inhaler 2  . ALPRAZolam (XANAX) 0.5 MG tablet Take 0.5 mg by mouth at bedtime as needed.    [name] amLODipine (NORVASC) 5 MG tablet TAKE 1 TABLET(5 MG) BY MOUTH DAILY 90 tablet 0  . atorvastatin (LIPITOR) 10 MG tablet TAKE 1 TABLET(10 MG) BY MOUTH DAILY 90 tablet 1  . cetirizine (ZYRTEC) 10 MG tablet Take 10 mg by mouth daily.    . fish oil-omega-3 fatty acids 1000 MG capsule Take 1 g by mouth daily.    . Flaxseed, Linseed, 1000 MG CAPS Take 1 capsule by mouth daily.    . fluticasone (FLONASE) 50 MCG/ACT nasal spray Place 2 sprays into both nostrils daily. 16 g 6  . olmesartan (BENICAR) 20 MG tablet TAKE 1 TABLET(20 MG) BY MOUTH DAILY 90 tablet 0  . tadalafil (CIALIS) 20 MG tablet Take 20 mg by mouth daily as needed.    . warfarin (COUMADIN) 5 MG tablet TAKE 1 TABLET(5 MG) BY MOUTH DAILY 90 tablet 0   Current Facility-Administered Medications on File Prior to Visit  Medication Dose Route Frequency Provider Last Rate Last Dose  . 0.9 %  sodium chloride infusion  500 mL Intravenous Continuous Armbruster, [name], MD  Objective:  Objective  Physical Exam  Constitutional: He is oriented to person, place, and time. Vital signs are normal. He appears well-developed and well-nourished. He is sleeping.  HENT:  Head: Normocephalic and atraumatic.  Mouth/Throat: Oropharynx is clear and moist.  Eyes: Pupils are equal, round, and reactive to light. EOM are normal.  Neck: Normal range of motion. Neck supple. No thyromegaly present.  Cardiovascular: Normal rate and regular rhythm.  No murmur  heard. Pulmonary/Chest: Effort normal and breath sounds normal. No respiratory distress. He has no wheezes. He has no rales. He exhibits no tenderness.  Musculoskeletal: He exhibits no edema or tenderness.  Neurological: He is alert and oriented to person, place, and time.  Skin: Skin is warm and dry.  Psychiatric: He has a normal mood and affect. His behavior is normal. Judgment and thought content normal.  Nursing note and vitals reviewed.  BP 138/78 (BP Location: Left Arm, Patient Position: Sitting, Cuff Size: Large)   Pulse 86   Temp 98.2 F (36.8 C) (Oral)   Ht 5\' 11"  (1.803 m)   Wt (!) 337 lb (152.9 kg)   SpO2 95%   BMI 47.00 kg/m  Wt Readings from Last 3 Encounters:  02/01/18 (!) 337 lb (152.9 kg)  06/28/17 (!) 331 lb (150.1 kg)  05/08/17 (!) 336 lb (152.4 kg)     Lab Results  Component Value Date   WBC 7.9 06/28/2017   HGB 14.6 06/28/2017   HCT 45.0 06/28/2017   PLT 221.0 06/28/2017   GLUCOSE 104 (H) 02/01/2018   CHOL 118 02/01/2018   TRIG 79.0 02/01/2018   HDL 36.00 (L) 02/01/2018   LDLCALC 66 02/01/2018   ALT 16 02/01/2018   AST 11 02/01/2018   NA 139 02/01/2018   K 3.9 02/01/2018   CL 105 02/01/2018   CREATININE 1.02 02/01/2018   BUN 14 02/01/2018   CO2 29 02/01/2018   TSH 1.38 06/28/2017   PSA 1.31 06/28/2017   INR 2.6 02/01/2018   HGBA1C 6.2 02/01/2018   MICROALBUR 1.3 10/22/2012  ekg-- nsr --no acute changes   No results found.   Assessment & Plan:  Plan  I am having Daniel Powers maintain his fish oil-omega-3 fatty acids, tadalafil, Flaxseed (Linseed), ALPRAZolam, cetirizine, fluticasone, albuterol, atorvastatin, warfarin, amLODipine, and olmesartan. We will continue to administer sodium chloride.  No orders of the defined types were placed in this encounter.   Problem List Items Addressed This Visit      Unprioritized   Chest pain - Primary   Relevant Orders   EKG 12-Lead (Completed)   DG Chest 2 View   Diabetes mellitus without  complication (HCC)    [id number] acceptable, minimize simple carbs. Increase exercise as tolerated. Continue current meds  con't meds   Lab Results  Component Value Date   HGBA1C 6.2 02/01/2018        Essential hypertension    Well controlled, no changes to meds. Encouraged heart healthy diet such as the DASH diet and exercise as tolerated.       Hyperlipidemia    Tolerating statin, encouraged heart healthy diet, avoid trans fats, minimize simple carbs and saturated fats. 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  Ann Held, DO

## 2018-02-01 NOTE — Assessment & Plan Note (Signed)
On coumadin Pt refuses alternate treatment

## 2018-02-01 NOTE — Assessment & Plan Note (Signed)
Well controlled, no changes to meds. Encouraged heart healthy diet such as the DASH diet and exercise as tolerated.  °

## 2018-02-01 NOTE — Patient Instructions (Signed)
Chest Wall Pain °Chest wall pain is pain in or around the bones and muscles of your chest. Sometimes, an injury causes this pain. Sometimes, the cause may not be known. This pain may take several weeks or longer to get better. °Follow these instructions at home: °Pay attention to any changes in your symptoms. Take these actions to help with your pain: °· Rest as told by your health care provider. °· Avoid activities that cause pain. These include any activities that use your chest muscles or your abdominal and side muscles to lift heavy items. °· If directed, apply ice to the painful area: °? Put ice in a plastic bag. °? Place a towel between your skin and the bag. °? Leave the ice on for 20 minutes, 2-3 times per day. °· Take over-the-counter and prescription medicines only as told by your health care provider. °· Do not use tobacco products, including cigarettes, chewing tobacco, and e-cigarettes. If you need help quitting, ask your health care provider. °· Keep all follow-up visits as told by your health care provider. This is important. ° °Contact a health care provider if: °· You have a fever. °· Your chest pain becomes worse. °· You have new symptoms. °Get help right away if: °· You have nausea or vomiting. °· You feel sweaty or light-headed. °· You have a cough with phlegm (sputum) or you cough up blood. °· You develop shortness of breath. °This information is not intended to replace advice given to you by your health care provider. Make sure you discuss any questions you have with your health care provider. °Document Released: 07/03/2005 Document Revised: 11/11/2015 Document Reviewed: 09/28/2014 °Elsevier Interactive Patient Education © 2018 [employer] ° °

## 2018-02-02 ENCOUNTER — Other Ambulatory Visit: Payer: Self-pay | Admitting: Family Medicine

## 2018-02-05 NOTE — Addendum Note (Signed)
Addended byDamita Dunnings D on: 02/05/2018 08:10 AM   Modules accepted: Orders

## 2018-02-07 ENCOUNTER — Ambulatory Visit: Payer: 59

## 2018-03-01 ENCOUNTER — Other Ambulatory Visit: Payer: Self-pay | Admitting: Family Medicine

## 2018-03-01 DIAGNOSIS — E785 Hyperlipidemia, unspecified: Secondary | ICD-10-CM

## 2018-03-07 ENCOUNTER — Ambulatory Visit (INDEPENDENT_AMBULATORY_CARE_PROVIDER_SITE_OTHER): Payer: 59

## 2018-03-07 DIAGNOSIS — Z7901 Long term (current) use of anticoagulants: Secondary | ICD-10-CM | POA: Diagnosis not present

## 2018-03-07 LAB — POCT INR: INR: 2.1 (ref 2.0–3.0)

## 2018-03-07 NOTE — Progress Notes (Signed)
Reviewed  Yvonne R Lowne Chase, DO  

## 2018-03-07 NOTE — Progress Notes (Signed)
Pre visit review using our clinic tool,if applicable. No additional management support is needed unless otherwise documented below in the visit note.   Pt here for INR check per order from Dr. Etter Sjogren - Chase  Goal INR = 2.0-3.0  Last INR = 2.6  Pt currently takes Coumadin 5 mg daily  Pt denies recent antibiotics, no dietary changes and no unusual bruising / bleeding.  INR today = 2.1  Pt advised per Dr. Carollee Herter to continue taking Coumadin as ordered and return for INR check in 1 mont. Appointment scheduled.

## 2018-03-31 ENCOUNTER — Other Ambulatory Visit: Payer: Self-pay | Admitting: Family Medicine

## 2018-03-31 DIAGNOSIS — I10 Essential (primary) hypertension: Secondary | ICD-10-CM

## 2018-04-09 ENCOUNTER — Ambulatory Visit (INDEPENDENT_AMBULATORY_CARE_PROVIDER_SITE_OTHER): Payer: 59

## 2018-04-09 DIAGNOSIS — Z7901 Long term (current) use of anticoagulants: Secondary | ICD-10-CM | POA: Diagnosis not present

## 2018-04-09 LAB — POCT INR: INR: 2 (ref 2.0–3.0)

## 2018-04-09 NOTE — Progress Notes (Signed)
Noted  Yvonne R Lowne Chase, DO  

## 2018-04-09 NOTE — Progress Notes (Signed)
+  Pre visit review using our clinic tool,if applicable. No additional management support is needed unless otherwise documented below in the visit note.   Pt here for INR check per order from Dr. Roma Schanz.  Goal INR = 2.0-3.0  Last INR = 2.1  Pt currently takes Coumadin 5 mg daily.   Pt denies recent antibiotics, no dietary changes and no unusual bruising / bleeding.  INR today = 2.0  Pt advised per Dr. Etter Sjogren - Chase patient to continue taking medications as ordered. Return for INR re-check in 1 month.

## 2018-04-30 ENCOUNTER — Other Ambulatory Visit: Payer: Self-pay | Admitting: Family Medicine

## 2018-04-30 DIAGNOSIS — I82401 Acute embolism and thrombosis of unspecified deep veins of right lower extremity: Secondary | ICD-10-CM

## 2018-05-09 ENCOUNTER — Ambulatory Visit (INDEPENDENT_AMBULATORY_CARE_PROVIDER_SITE_OTHER): Payer: 59

## 2018-05-09 DIAGNOSIS — Z7901 Long term (current) use of anticoagulants: Secondary | ICD-10-CM | POA: Diagnosis not present

## 2018-05-09 LAB — POCT INR: INR: 2.8 (ref 2.0–3.0)

## 2018-05-09 NOTE — Progress Notes (Signed)
noted 

## 2018-05-09 NOTE — Progress Notes (Signed)
Pre visit review using our clinic tool,if applicable. No additional management support is needed unless otherwise documented below in the visit note.   Pt here for INR check per order from Dr. Carollee Herter.  Goal INR = 2.0-3.0  Last INR = 2.0  Pt currently takes Coumadin 5 mg daily.  Pt denies recent antibiotics, no dietary changes and no unusual bruising / bleeding.  INR today = 2.8  Pt advised per continue current dose of Coumadin and return for INR check in 1 month. Patient states he will be out of town so it will run shortly over a month.

## 2018-06-01 ENCOUNTER — Other Ambulatory Visit: Payer: Self-pay | Admitting: Family Medicine

## 2018-06-01 DIAGNOSIS — I82401 Acute embolism and thrombosis of unspecified deep veins of right lower extremity: Secondary | ICD-10-CM

## 2018-06-20 ENCOUNTER — Ambulatory Visit: Payer: 59

## 2018-06-20 DIAGNOSIS — Z7901 Long term (current) use of anticoagulants: Secondary | ICD-10-CM

## 2018-06-20 LAB — POCT INR: INR: 2.3 (ref 2.0–3.0)

## 2018-06-20 NOTE — Progress Notes (Unsigned)
Pre visit review using our clinic tool,if applicable. No additional management support is needed unless otherwise documented below in the visit note.   Pt here for INR check per order from Dr. Etter Sjogren- Chase dated 05/09/18.  Goal INR = 2.0-3.0  Last INR = 2.8  Pt currently takes Coumadin 5 mg daily.  Pt denies recent antibiotics, no dietary changes and no unusual bruising / bleeding.  INR today = 2.3  Pt advised per DR. Copland to continue Coumadin 5 mg daily and return for INR check in 1 month. Appointment scheduled.

## 2018-06-30 ENCOUNTER — Other Ambulatory Visit: Payer: Self-pay | Admitting: Family Medicine

## 2018-06-30 DIAGNOSIS — I10 Essential (primary) hypertension: Secondary | ICD-10-CM

## 2018-07-01 ENCOUNTER — Other Ambulatory Visit: Payer: Self-pay | Admitting: Family Medicine

## 2018-07-01 DIAGNOSIS — I82401 Acute embolism and thrombosis of unspecified deep veins of right lower extremity: Secondary | ICD-10-CM

## 2018-07-23 ENCOUNTER — Ambulatory Visit (INDEPENDENT_AMBULATORY_CARE_PROVIDER_SITE_OTHER): Payer: 59

## 2018-07-23 DIAGNOSIS — Z7901 Long term (current) use of anticoagulants: Secondary | ICD-10-CM | POA: Diagnosis not present

## 2018-07-23 LAB — POCT INR: INR: 2.3 (ref 2.0–3.0)

## 2018-07-23 NOTE — Progress Notes (Signed)
Pre visit review using our clinic tool,if applicable. No additional management support is needed unless otherwise documented below in the visit note.   Pt here for INR check per order from Dr. Roma Schanz.  Goal INR = 2.0-3.0  Last INR = 2.3  Pt currently takes Coumadin 5 mg daily  Pt denies recent antibiotics, no dietary changes and no unusual bruising / bleeding.  INR today = 2.3  Pt advised per continue Coumadin as ordered and return for INR check in 1 month. Appointment scheduled.

## 2018-07-23 NOTE — Progress Notes (Signed)
Reviewed  Yvonne R Lowne Chase, DO  

## 2018-08-05 ENCOUNTER — Encounter: Payer: Self-pay | Admitting: Family Medicine

## 2018-08-05 ENCOUNTER — Ambulatory Visit: Payer: 59 | Admitting: Family Medicine

## 2018-08-05 VITALS — BP 122/76 | HR 76 | Temp 98.2°F | Resp 18 | Ht 71.0 in | Wt 327.2 lb

## 2018-08-05 DIAGNOSIS — E119 Type 2 diabetes mellitus without complications: Secondary | ICD-10-CM | POA: Diagnosis not present

## 2018-08-05 DIAGNOSIS — E1169 Type 2 diabetes mellitus with other specified complication: Secondary | ICD-10-CM | POA: Diagnosis not present

## 2018-08-05 DIAGNOSIS — I10 Essential (primary) hypertension: Secondary | ICD-10-CM | POA: Diagnosis not present

## 2018-08-05 DIAGNOSIS — E785 Hyperlipidemia, unspecified: Secondary | ICD-10-CM | POA: Diagnosis not present

## 2018-08-05 DIAGNOSIS — I82401 Acute embolism and thrombosis of unspecified deep veins of right lower extremity: Secondary | ICD-10-CM

## 2018-08-05 LAB — LIPID PANEL
CHOLESTEROL: 113 mg/dL (ref 0–200)
HDL: 32.8 mg/dL — ABNORMAL LOW (ref >39.00)
LDL CALC: 68 mg/dL (ref 0–99)
NonHDL: 79.92
TRIGLYCERIDES: 62 mg/dL (ref 0.0–149.0)
Total CHOL/HDL Ratio: 3
VLDL: 12.4 mg/dL (ref 0.0–40.0)

## 2018-08-05 LAB — COMPREHENSIVE METABOLIC PANEL
ALBUMIN: 4 g/dL (ref 3.5–5.2)
ALK PHOS: 46 U/L (ref 39–117)
ALT: 14 U/L (ref 0–53)
AST: 14 U/L (ref 0–37)
BILIRUBIN TOTAL: 0.6 mg/dL (ref 0.2–1.2)
BUN: 16 mg/dL (ref 6–23)
CO2: 27 mEq/L (ref 19–32)
Calcium: 9 mg/dL (ref 8.4–10.5)
Chloride: 107 mEq/L (ref 96–112)
Creatinine, Ser: 1.01 mg/dL (ref 0.40–1.50)
GFR: 90 mL/min (ref >60.00)
GLUCOSE: 96 mg/dL (ref 70–99)
POTASSIUM: 3.9 meq/L (ref 3.5–5.1)
Sodium: 141 mEq/L (ref 135–145)
TOTAL PROTEIN: 6.4 g/dL (ref 6.0–8.3)

## 2018-08-05 LAB — HEMOGLOBIN A1C: Hgb A1c MFr Bld: 6.1 % (ref 4.6–6.5)

## 2018-08-05 NOTE — Assessment & Plan Note (Signed)
Tolerating statin, encouraged heart healthy diet, avoid trans fats, minimize simple carbs and saturated fats. Increase exercise as tolerated 

## 2018-08-05 NOTE — Assessment & Plan Note (Signed)
Well controlled, no changes to meds. Encouraged heart healthy diet such as the DASH diet and exercise as tolerated.  °

## 2018-08-05 NOTE — Assessment & Plan Note (Signed)
hgba1c to be checked, minimize simple carbs. Increase exercise as tolerated. Continue current meds  

## 2018-08-05 NOTE — Assessment & Plan Note (Signed)
On coumadin Monthly PT/ inr

## 2018-08-05 NOTE — Patient Instructions (Signed)
Carbohydrate Counting for Diabetes Mellitus, Adult  Carbohydrate counting is a method of keeping track of how many carbohydrates you eat. Eating carbohydrates naturally increases the amount of sugar (glucose) in the blood. Counting how many carbohydrates you eat helps keep your blood glucose within normal limits, which helps you manage your diabetes (diabetes mellitus). It is important to know how many carbohydrates you can safely have in each meal. This is different for every person. A diet and nutrition specialist (registered dietitian) can help you make a meal plan and calculate how many carbohydrates you should have at each meal and snack. Carbohydrates are found in the following foods:  Grains, such as breads and cereals.  Dried beans and soy products.  Starchy vegetables, such as potatoes, peas, and corn.  Fruit and fruit juices.  Milk and yogurt.  Sweets and snack foods, such as cake, cookies, candy, chips, and soft drinks. How do I count carbohydrates? There are two ways to count carbohydrates in food. You can use either of the methods or a combination of both. Reading "Nutrition Facts" on packaged food The "Nutrition Facts" list is included on the labels of almost all packaged foods and beverages in the [place] It includes:  The serving size.  Information about nutrients in each serving, including the grams (g) of carbohydrate per serving. To use the "Nutrition Facts":  Decide how many servings you will have.  Multiply the number of servings by the number of carbohydrates per serving.  The resulting number is the total amount of carbohydrates that you will be having. Learning standard serving sizes of other foods When you eat carbohydrate foods that are not packaged or do not include "Nutrition Facts" on the label, you need to measure the servings in order to count the amount of carbohydrates:  Measure the foods that you will eat with a food scale or measuring cup, if needed.   Decide how many standard-size servings you will eat.  Multiply the number of servings by 15. Most carbohydrate-rich foods have about 15 g of carbohydrates per serving. ? For example, if you eat 8 oz (170 g) of strawberries, you will have eaten 2 servings and 30 g of carbohydrates (2 servings x 15 g = 30 g).  For foods that have more than one food mixed, such as soups and casseroles, you must count the carbohydrates in each food that is included. The following list contains standard serving sizes of common carbohydrate-rich foods. Each of these servings has about 15 g of carbohydrates:   hamburger bun or  English muffin.   oz (15 mL) syrup.   oz (14 g) jelly.  1 slice of bread.  1 six-inch tortilla.  3 oz (85 g) cooked rice or pasta.  4 oz (113 g) cooked dried beans.  4 oz (113 g) starchy vegetable, such as peas, corn, or potatoes.  4 oz (113 g) hot cereal.  4 oz (113 g) mashed potatoes or  of a large baked potato.  4 oz (113 g) canned or frozen fruit.  4 oz (120 mL) fruit juice.  4-6 crackers.  6 chicken nuggets.  6 oz (170 g) unsweetened dry cereal.  6 oz (170 g) plain fat-free yogurt or yogurt sweetened with artificial sweeteners.  8 oz (240 mL) milk.  8 oz (170 g) fresh fruit or one small piece of fruit.  24 oz (680 g) popped popcorn. Example of carbohydrate counting Sample meal  3 oz (85 g) chicken breast.  6 oz (170 g)   brown rice.  4 oz (113 g) corn.  8 oz (240 mL) milk.  8 oz (170 g) strawberries with sugar-free whipped topping. Carbohydrate calculation 1. Identify the foods that contain carbohydrates: ? Rice. ? Corn. ? Milk. ? Strawberries. 2. Calculate how many servings you have of each food: ? 2 servings rice. ? 1 serving corn. ? 1 serving milk. ? 1 serving strawberries. 3. Multiply each number of servings by 15 g: ? 2 servings rice x 15 g = 30 g. ? 1 serving corn x 15 g = 15 g. ? 1 serving milk x 15 g = 15 g. ? 1 serving  strawberries x 15 g = 15 g. 4. Add together all of the amounts to find the total grams of carbohydrates eaten: ? 30 g + 15 g + 15 g + 15 g = 75 g of carbohydrates total. Summary  Carbohydrate counting is a method of keeping track of how many carbohydrates you eat.  Eating carbohydrates naturally increases the amount of sugar (glucose) in the blood.  Counting how many carbohydrates you eat helps keep your blood glucose within normal limits, which helps you manage your diabetes.  A diet and nutrition specialist (registered dietitian) can help you make a meal plan and calculate how many carbohydrates you should have at each meal and snack. This information is not intended to replace advice given to you by your health care provider. Make sure you discuss any questions you have with your health care provider. Document Released: 07/03/2005 Document Revised: 01/10/2017 Document Reviewed: 12/15/2015 Elsevier Interactive Patient Education  2019 [employer]  

## 2018-08-05 NOTE — Progress Notes (Signed)
Patient ID: Daniel Powers, male    DOB: March 25, 1956  Age: 63 y.o. MRN: [id number]    Subjective:  Subjective  HPI Daniel Powers presents for f/u dm, bp and chol.  No complaints.   Review of Systems  Constitutional: Negative for appetite change, diaphoresis, fatigue and unexpected weight change.  Eyes: Negative for pain, redness and visual disturbance.  Respiratory: Negative for cough, chest tightness, shortness of breath and wheezing.   Cardiovascular: Negative for chest pain, palpitations and leg swelling.  Endocrine: Negative for cold intolerance, heat intolerance, polydipsia, polyphagia and polyuria.  Genitourinary: Negative for difficulty urinating, dysuria and frequency.  Neurological: Negative for dizziness, light-headedness, numbness and headaches.    History Past Medical History:  Diagnosis Date  . Anxiety   . Arthritis   . DVT (deep venous thrombosis) ([place])   . Gout   . Hypertension   . Influenza with pneumonia   . Obesity   . Psychosexual dysfunction with inhibited sexual excitement     He has a past surgical history that includes Tonsillectomy and adenoidectomy and Hand surgery.   His family history includes Alcohol abuse in his father; Alzheimer's disease in his maternal grandmother, mother, and paternal aunt; Cancer in his sister; Esophageal cancer in his maternal uncle; Heart disease (age of onset: 73) in his brother; Hypertension in his father, maternal grandfather, maternal grandmother, mother, paternal grandfather, and paternal grandmother; Lung cancer in his maternal uncle.He reports that he has never smoked. He has never used smokeless tobacco. He reports current alcohol use. He reports that he does not use drugs.  Current Outpatient Medications on File Prior to Visit  Medication Sig Dispense Refill  . albuterol (PROAIR HFA) 108 (90 Base) MCG/ACT inhaler Inhale 2 puffs into the lungs every 6 (six) hours as needed for wheezing. 1 Inhaler 2  . ALPRAZolam (XANAX)  0.5 MG tablet Take 0.5 mg by mouth at bedtime as needed.    [name] amLODipine (NORVASC) 5 MG tablet TAKE 1 TABLET(5 MG) BY MOUTH DAILY 90 tablet 1  . atorvastatin (LIPITOR) 10 MG tablet Take 1 tablet (10 mg total) by mouth daily at 6 PM. 90 tablet 1  . cetirizine (ZYRTEC) 10 MG tablet Take 10 mg by mouth daily.    [name] CINNAMON PO Take 1 tablet by mouth daily.    . fish oil-omega-3 fatty acids 1000 MG capsule Take 1 g by mouth daily.    . Flaxseed, Linseed, 1000 MG CAPS Take 1 capsule by mouth daily.    . fluticasone (FLONASE) 50 MCG/ACT nasal spray Place 2 sprays into both nostrils daily. 16 g 6  . olmesartan (BENICAR) 20 MG tablet TAKE 1 TABLET(20 MG) BY MOUTH DAILY 90 tablet 1  . tadalafil (CIALIS) 20 MG tablet Take 20 mg by mouth daily as needed.    . warfarin (COUMADIN) 5 MG tablet TAKE AS DIRECTED BY COUMADIN CLINIC 90 tablet 0   Current Facility-Administered Medications on File Prior to Visit  Medication Dose Route Frequency Provider Last Rate Last Dose  . 0.9 %  sodium chloride infusion  500 mL Intravenous Continuous Armbruster, [name], MD         Objective:  Objective  Physical Exam Vitals signs and nursing note reviewed.  Constitutional:      General: He is sleeping.     Appearance: He is well-developed.  HENT:     Head: Normocephalic and atraumatic.  Eyes:     Pupils: Pupils are equal, round, and reactive to light.  Neck:     Musculoskeletal: Normal range of motion and neck supple.     Thyroid: No thyromegaly.  Cardiovascular:     Rate and Rhythm: Normal rate and regular rhythm.     Heart sounds: No murmur.  Pulmonary:     Effort: Pulmonary effort is normal. No respiratory distress.     Breath sounds: Normal breath sounds. No wheezing or rales.  Chest:     Chest wall: No tenderness.  Musculoskeletal:        General: No tenderness.  Skin:    General: Skin is warm and dry.  Neurological:     Mental Status: He is oriented to person, place, and time.  Psychiatric:         Behavior: Behavior normal.        Thought Content: Thought content normal.        Judgment: Judgment normal.    BP 122/76 (BP Location: Left Arm, Cuff Size: Large)   Pulse 76   Temp 98.2 F (36.8 C) (Oral)   Resp 18   Ht 5\' 11"  (1.803 m)   Wt (!) 327 lb 3.2 oz (148.4 kg)   SpO2 98%   BMI 45.64 kg/m  Wt Readings from Last 3 Encounters:  08/05/18 (!) 327 lb 3.2 oz (148.4 kg)  02/01/18 (!) 337 lb (152.9 kg)  06/28/17 (!) 331 lb (150.1 kg)     Lab Results  Component Value Date   WBC 7.9 06/28/2017   HGB 14.6 06/28/2017   HCT 45.0 06/28/2017   PLT 221.0 06/28/2017   GLUCOSE 104 (H) 02/01/2018   CHOL 118 02/01/2018   TRIG 79.0 02/01/2018   HDL 36.00 (L) 02/01/2018   LDLCALC 66 02/01/2018   ALT 16 02/01/2018   AST 11 02/01/2018   NA 139 02/01/2018   K 3.9 02/01/2018   CL 105 02/01/2018   CREATININE 1.02 02/01/2018   BUN 14 02/01/2018   CO2 29 02/01/2018   TSH 1.38 06/28/2017   PSA 1.31 06/28/2017   INR 2.3 07/23/2018   HGBA1C 6.2 02/01/2018   MICROALBUR 1.3 10/22/2012    Dg Chest 2 View  Result Date: 02/02/2018 CLINICAL DATA:  Chest pain with deep inspiration for the past 2 months. EXAM: CHEST - 2 VIEW COMPARISON:  Chest x-ray dated February 05, 2012. FINDINGS: The heart size and mediastinal contours are within normal limits. Normal pulmonary vascularity. No focal consolidation, pleural effusion, or pneumothorax. No acute osseous abnormality. IMPRESSION: No active cardiopulmonary disease. Electronically Signed   By: Titus Dubin M.D.   On: 02/02/2018 09:39   Dg Thoracic Spine 2 View  Result Date: 02/02/2018 CLINICAL DATA:  Back pain with deep inspiration over the past 2 months. EXAM: THORACIC SPINE 2 VIEWS COMPARISON:  Chest x-ray dated February 05, 2012. FINDINGS: Twelve rib-bearing thoracic vertebral bodies. No acute fracture or subluxation. Vertebral body heights are preserved. Alignment is normal. Flowing anterior endplate osteophytes, consistent with DISH.  Intervertebral disc spaces are maintained. IMPRESSION: 1.  No acute osseous abnormality. 2. Findings consistent with DISH. Electronically Signed   By: Titus Dubin M.D.   On: 02/02/2018 09:41     Assessment & Plan:  Plan  I am having [name] maintain his fish oil-omega-3 fatty acids, tadalafil, Flaxseed (Linseed), ALPRAZolam, cetirizine, fluticasone, albuterol, atorvastatin, olmesartan, amLODipine, warfarin, and CINNAMON PO. We will continue to administer sodium chloride.  No orders of the defined types were placed in this encounter.   Problem List Items Addressed This Visit  Unprioritized   Diet-controlled diabetes mellitus ([place]) - Primary    hgba1c to be checked, minimize simple carbs. Increase exercise as tolerated. Continue current meds       Relevant Orders   Lipid panel   Hemoglobin A1c   Comprehensive metabolic panel   Insulin, random   Essential hypertension    Well controlled, no changes to meds. Encouraged heart healthy diet such as the DASH diet and exercise as tolerated.       Relevant Orders   Lipid panel   Hemoglobin A1c   Comprehensive metabolic panel   Hyperlipidemia    Tolerating statin, encouraged heart healthy diet, avoid trans fats, minimize simple carbs and saturated fats. 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  Ann Held, DO

## 2018-08-06 LAB — INSULIN, RANDOM: Insulin: 10.7 u[IU]/mL (ref 2.0–19.6)

## 2018-08-27 ENCOUNTER — Ambulatory Visit: Payer: 59

## 2018-08-29 ENCOUNTER — Ambulatory Visit (INDEPENDENT_AMBULATORY_CARE_PROVIDER_SITE_OTHER): Payer: 59

## 2018-08-29 ENCOUNTER — Other Ambulatory Visit: Payer: Self-pay | Admitting: Family Medicine

## 2018-08-29 DIAGNOSIS — Z7901 Long term (current) use of anticoagulants: Secondary | ICD-10-CM | POA: Diagnosis not present

## 2018-08-29 DIAGNOSIS — E785 Hyperlipidemia, unspecified: Secondary | ICD-10-CM

## 2018-08-29 LAB — POCT INR: INR: 1.9 — AB (ref 2.0–3.0)

## 2018-08-29 NOTE — Progress Notes (Addendum)
Pt here for INR check per Dr. Etter Sjogren PCP  Goal INR =2.0-3.0  Last INR =2.3  Pt currently takes Coumadin 5mg  daily.   Pt denies recent antibiotics, no dietary changes and no unusual bruising / bleeding.  INR today = 1.9  Pt advised per Dr. Carollee Herter take one extra half today and then continue 5mg  daily Recheck in 2 weeks.   Agree   Ann Held, DO

## 2018-09-02 DIAGNOSIS — H2513 Age-related nuclear cataract, bilateral: Secondary | ICD-10-CM | POA: Diagnosis not present

## 2018-09-02 DIAGNOSIS — E113293 Type 2 diabetes mellitus with mild nonproliferative diabetic retinopathy without macular edema, bilateral: Secondary | ICD-10-CM | POA: Diagnosis not present

## 2018-09-12 ENCOUNTER — Ambulatory Visit (INDEPENDENT_AMBULATORY_CARE_PROVIDER_SITE_OTHER): Payer: 59 | Admitting: Family Medicine

## 2018-09-12 DIAGNOSIS — Z7901 Long term (current) use of anticoagulants: Secondary | ICD-10-CM | POA: Diagnosis not present

## 2018-09-12 LAB — POCT INR: INR: 2.3 (ref 2.0–3.0)

## 2018-09-12 NOTE — Progress Notes (Signed)
Pt here for INR check per Dr. Etter Sjogren PCP  Goal INR =2.0-3.0  Last INR =1.9  Pt currently takes Coumadin 5mg  daily.   Pt denies recent antibiotics, no dietary changes and no unusual bruising / bleeding.  INR today = 2.3   Per Dr. Etter Sjogren patient to continue 5mg  daily and to recheck in 1 month.  Appointment scheduled 10/10/18.

## 2018-10-03 ENCOUNTER — Other Ambulatory Visit: Payer: Self-pay

## 2018-10-03 ENCOUNTER — Encounter (INDEPENDENT_AMBULATORY_CARE_PROVIDER_SITE_OTHER): Payer: Self-pay

## 2018-10-10 ENCOUNTER — Ambulatory Visit (INDEPENDENT_AMBULATORY_CARE_PROVIDER_SITE_OTHER): Payer: 59

## 2018-10-10 ENCOUNTER — Other Ambulatory Visit: Payer: Self-pay

## 2018-10-10 DIAGNOSIS — Z7901 Long term (current) use of anticoagulants: Secondary | ICD-10-CM | POA: Diagnosis not present

## 2018-10-10 LAB — POCT INR: INR: 2.9 (ref 2.0–3.0)

## 2018-10-10 NOTE — Progress Notes (Signed)
Noted  Arika Mainer R Lowne Chase, DO  

## 2018-10-10 NOTE — Progress Notes (Signed)
Pre visit review using our clinic tool,if applicable. No additional management support is needed unless otherwise documented below in the visit note.   Pt here for INR check per order from Dr. Lizbeth Bark -Chase  Goal INR = 2.0-3.0  Last INR = 2.3  Pt currently takes Coumadin 5 mg daily  Pt denies recent antibiotics, no dietary changes and no unusual bruising / bleeding. No complaints voiced this visit.  INR today = 2.9  Pt advised per Dr. Carollee Herter to return in 1 month. Appointment scheduled.

## 2018-10-15 ENCOUNTER — Ambulatory Visit (INDEPENDENT_AMBULATORY_CARE_PROVIDER_SITE_OTHER): Payer: Self-pay | Admitting: Family Medicine

## 2018-10-21 ENCOUNTER — Telehealth: Payer: 59 | Admitting: Family

## 2018-10-21 DIAGNOSIS — J4521 Mild intermittent asthma with (acute) exacerbation: Secondary | ICD-10-CM | POA: Diagnosis not present

## 2018-10-21 MED ORDER — ALBUTEROL SULFATE HFA 108 (90 BASE) MCG/ACT IN AERS: 2.0000 | INHALATION_SPRAY | Freq: Four times a day (QID) | RESPIRATORY_TRACT | 2 refills | Status: DC | PRN

## 2018-10-21 NOTE — Progress Notes (Signed)
E Visit for Asthma  Based on what you have shared with me, it looks like you may have a flare up of your asthma.  Asthma is a chronic (ongoing) lung disease which results in airway obstruction, inflammation and hyper-responsiveness.   Asthma symptoms vary from person to person, with common symptoms including nighttime awakening and decreased ability to participate in normal activities as a result of shortness of breath. It is often triggered by changes in weather, changes in the season, changes in air temperature, or inside (home, school, daycare or work) allergens such as animal dander, mold, mildew, woodstoves or cockroaches.   It can also be triggered by hormonal changes, extreme emotion, physical exertion or an upper respiratory tract illness.     It is important to identify the trigger, and then eliminate or avoid the trigger if possible.   If you have been prescribed medications to be taken on a regular basis, it is important to follow the asthma action plan and to follow guidelines to adjust medication in response to increasing symptoms of decreased peak expiratory flow rate  Treatment: I have prescribed: Albuterol (Proventil HFA; Ventolin HFA) 108 (90 Base) MCG/ACT Inhaler 2 puffs into the lungs every six hours as needed for wheezing or shortness of breath  HOME CARE . Only take medications as instructed by your medical team. . Consider wearing a mask or scarf to improve breathing air temperature have been shown to decrease irritation and decrease exacerbations . Get rest. . Taking a steamy shower or using a humidifier may help nasal congestion sand ease sore throat pain. You can place a towel over your head and breathe in the steam from hot water coming from a faucet. . Using a saline nasal spray works much the same way.  . Cough drops, hare candies and sore throat lozenges may  ease your cough.  . Avoid close contacts especially the very you and the elderly . Cover your mouth if you cough or sneeze . Always remember to wash your hands.    GET HELP RIGHT AWAY IF: . You develop worsening symptoms; breathlessness at rest, drowsy, confused or agitated, unable to speak in full sentences . You have coughing fits . You develop a severe headache or visual changes . You develop shortness of breath, difficulty breathing or start having chest pain . Your symptoms persist after you have completed your treatment plan . If your symptoms do not improve within 10 days  MAKE SURE YOU . Understand these instructions. . Will watch your condition. . Will get help right away if you are not doing well or get worse.   Your e-visit answers were reviewed by a board certified advanced clinical practitioner to complete your personal care plan, Depending upon the condition, your plan could have included both over the counter or prescription medications.  Please review your pharmacy choice. Your safety is important to [name]. If you have drug allergies check your prescription carefully. You can use MyChart to ask questions about today's visit, request a non-urgent call back, or ask for a work or school excuse for 24 hours related to this e-Visit. If it has been greater than 24 hours you will need to follow up with your provider, or enter a new e-Visit to address those concerns.  You will get an e-mail in the next two days asking about your experience. I hope that your e-visit has been valuable and will speed your recovery. Thank you for using e-visits.

## 2018-10-31 ENCOUNTER — Ambulatory Visit (INDEPENDENT_AMBULATORY_CARE_PROVIDER_SITE_OTHER): Payer: Self-pay | Admitting: Family Medicine

## 2018-11-12 ENCOUNTER — Encounter: Payer: Self-pay | Admitting: *Deleted

## 2018-11-12 ENCOUNTER — Other Ambulatory Visit: Payer: Self-pay

## 2018-11-12 ENCOUNTER — Ambulatory Visit (INDEPENDENT_AMBULATORY_CARE_PROVIDER_SITE_OTHER): Payer: 59 | Admitting: *Deleted

## 2018-11-12 DIAGNOSIS — I82401 Acute embolism and thrombosis of unspecified deep veins of right lower extremity: Secondary | ICD-10-CM | POA: Diagnosis not present

## 2018-11-12 LAB — POCT INR: INR: 2.2 (ref 2.0–3.0)

## 2018-11-12 MED ORDER — WARFARIN SODIUM 5 MG PO TABS: ORAL_TABLET | ORAL | 0 refills | Status: DC

## 2018-11-12 NOTE — Patient Instructions (Addendum)
Coumadin Check: 2.2 today, pt has no issues; request refill for Coumadin to [facility] [done]; follow-up in one month/SLS 04/28

## 2018-12-12 ENCOUNTER — Other Ambulatory Visit (INDEPENDENT_AMBULATORY_CARE_PROVIDER_SITE_OTHER): Payer: 59 | Admitting: Family Medicine

## 2018-12-12 ENCOUNTER — Ambulatory Visit (INDEPENDENT_AMBULATORY_CARE_PROVIDER_SITE_OTHER): Payer: 59

## 2018-12-12 ENCOUNTER — Other Ambulatory Visit: Payer: Self-pay

## 2018-12-12 DIAGNOSIS — Z7901 Long term (current) use of anticoagulants: Secondary | ICD-10-CM | POA: Diagnosis not present

## 2018-12-12 LAB — POCT INR: INR: 2.4 (ref 2.0–3.0)

## 2018-12-12 NOTE — Progress Notes (Signed)
Patient here today for INR check per Dr. Etter Sjogren.   Patient denies any leafy greens, recent antibiotics,denies any missed doses. Taking coumadin 5 mg daily.   INR goal 2.0-3.0  INR today 2.4   Per DOD Dr. Lorelei Pont, continue current dose and recheck nv in one month.   Reviewed and agree- continue current coumadin, repeat INR in one month  J Copland MD

## 2018-12-12 NOTE — Progress Notes (Signed)
POCT INR 2.4

## 2018-12-20 ENCOUNTER — Other Ambulatory Visit: Payer: Self-pay | Admitting: Family Medicine

## 2018-12-20 DIAGNOSIS — I10 Essential (primary) hypertension: Secondary | ICD-10-CM

## 2018-12-25 ENCOUNTER — Other Ambulatory Visit: Payer: Self-pay

## 2018-12-25 ENCOUNTER — Ambulatory Visit (INDEPENDENT_AMBULATORY_CARE_PROVIDER_SITE_OTHER): Payer: 59 | Admitting: Family Medicine

## 2018-12-25 ENCOUNTER — Other Ambulatory Visit: Payer: Self-pay | Admitting: *Deleted

## 2018-12-25 ENCOUNTER — Encounter (INDEPENDENT_AMBULATORY_CARE_PROVIDER_SITE_OTHER): Payer: Self-pay | Admitting: Family Medicine

## 2018-12-25 VITALS — BP 124/77 | HR 69 | Temp 98.0°F | Ht 71.0 in | Wt 309.0 lb

## 2018-12-25 DIAGNOSIS — R7303 Prediabetes: Secondary | ICD-10-CM | POA: Diagnosis not present

## 2018-12-25 DIAGNOSIS — Z1331 Encounter for screening for depression: Secondary | ICD-10-CM | POA: Diagnosis not present

## 2018-12-25 DIAGNOSIS — R5383 Other fatigue: Secondary | ICD-10-CM

## 2018-12-25 DIAGNOSIS — I10 Essential (primary) hypertension: Secondary | ICD-10-CM | POA: Diagnosis not present

## 2018-12-25 DIAGNOSIS — Z9189 Other specified personal risk factors, not elsewhere classified: Secondary | ICD-10-CM | POA: Diagnosis not present

## 2018-12-25 DIAGNOSIS — Z6841 Body Mass Index (BMI) 40.0 and over, adult: Secondary | ICD-10-CM

## 2018-12-25 DIAGNOSIS — Z0289 Encounter for other administrative examinations: Secondary | ICD-10-CM

## 2018-12-25 DIAGNOSIS — R0602 Shortness of breath: Secondary | ICD-10-CM | POA: Diagnosis not present

## 2018-12-26 LAB — LIPID PANEL WITH LDL/HDL RATIO
Cholesterol, Total: 122 mg/dL (ref 100–199)
HDL: 41 mg/dL (ref >39)
LDL Calculated: 69 mg/dL (ref 0–99)
LDl/HDL Ratio: 1.7 ratio (ref 0.0–3.6)
Triglycerides: 60 mg/dL (ref 0–149)
VLDL Cholesterol Cal: 12 mg/dL (ref 5–40)

## 2018-12-26 LAB — CBC WITH DIFFERENTIAL
Basophils Absolute: 0 10*3/uL (ref 0.0–0.2)
Basos: 1 %
EOS (ABSOLUTE): 0.1 10*3/uL (ref 0.0–0.4)
Eos: 1 %
Hematocrit: 45.1 % (ref 37.5–51.0)
Hemoglobin: 14.5 g/dL (ref 13.0–17.7)
Immature Grans (Abs): 0 10*3/uL (ref 0.0–0.1)
Immature Granulocytes: 0 %
Lymphocytes Absolute: 1.8 10*3/uL (ref 0.7–3.1)
Lymphs: 27 %
MCH: 26.9 pg (ref 26.6–33.0)
MCHC: 32.2 g/dL (ref 31.5–35.7)
MCV: 84 fL (ref 79–97)
Monocytes Absolute: 0.6 10*3/uL (ref 0.1–0.9)
Monocytes: 9 %
Neutrophils Absolute: 4 10*3/uL (ref 1.4–7.0)
Neutrophils: 62 %
RBC: 5.39 x10E6/uL (ref 4.14–5.80)
RDW: 14.8 % (ref 11.6–15.4)
WBC: 6.5 10*3/uL (ref 3.4–10.8)

## 2018-12-26 LAB — COMPREHENSIVE METABOLIC PANEL
ALT: 14 IU/L (ref 0–44)
AST: 17 IU/L (ref 0–40)
Albumin/Globulin Ratio: 1.7 (ref 1.2–2.2)
Albumin: 4 g/dL (ref 3.8–4.8)
Alkaline Phosphatase: 45 IU/L (ref 39–117)
BUN/Creatinine Ratio: 18 (ref 10–24)
BUN: 19 mg/dL (ref 8–27)
Bilirubin Total: 0.6 mg/dL (ref 0.0–1.2)
CO2: 20 mmol/L (ref 20–29)
Calcium: 9.1 mg/dL (ref 8.6–10.2)
Chloride: 104 mmol/L (ref 96–106)
Creatinine, Ser: 1.06 mg/dL (ref 0.76–1.27)
GFR calc Af Amer: 85 mL/min/{1.73_m2} (ref >59)
GFR calc non Af Amer: 74 mL/min/{1.73_m2} (ref >59)
Globulin, Total: 2.4 g/dL (ref 1.5–4.5)
Glucose: 98 mg/dL (ref 65–99)
Potassium: 4.3 mmol/L (ref 3.5–5.2)
Sodium: 139 mmol/L (ref 134–144)
Total Protein: 6.4 g/dL (ref 6.0–8.5)

## 2018-12-26 LAB — VITAMIN B12: Vitamin B-12: 276 pg/mL (ref 232–1245)

## 2018-12-26 LAB — INSULIN, RANDOM: INSULIN: 9.5 u[IU]/mL (ref 2.6–24.9)

## 2018-12-26 LAB — T4, FREE: Free T4: 1.17 ng/dL (ref 0.82–1.77)

## 2018-12-26 LAB — FOLATE: Folate: 4.5 ng/mL (ref >3.0)

## 2018-12-26 LAB — HEMOGLOBIN A1C
Est. average glucose Bld gHb Est-mCnc: 117 mg/dL
Hgb A1c MFr Bld: 5.7 % — ABNORMAL HIGH (ref 4.8–5.6)

## 2018-12-26 LAB — SPECIMEN STATUS REPORT

## 2018-12-26 LAB — TSH: TSH: 0.92 u[IU]/mL (ref 0.450–4.500)

## 2018-12-26 LAB — T3: T3, Total: 92 ng/dL (ref 71–180)

## 2018-12-26 LAB — VITAMIN D 25 HYDROXY (VIT D DEFICIENCY, FRACTURES): Vit D, 25-Hydroxy: <4 ng/mL — ABNORMAL LOW (ref 30.0–100.0)

## 2018-12-26 NOTE — Progress Notes (Signed)
Office: [phone]  /  Fax: [phone]   Dear Dr. Carollee Herter,   Thank you for referring [name] to our clinic. The following note includes my evaluation and treatment recommendations.  HPI:   Chief Complaint: OBESITY    JORIEL STREETY has been referred by Ann Held, DO for consultation regarding his obesity and obesity related comorbidities.    SLOAN TAKAGI (MR# [id number]) is a 63 y.o. male who presents on 12/25/2018 for obesity evaluation and treatment. Current BMI is Body mass index is 43.1 kg/m. Acheron has been struggling with his weight for many years and has been unsuccessful in either losing weight, maintaining weight loss, or reaching his healthy weight goal.     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 He is an attorney (bankrupcy). He is going out 14 times per week for takeout. For breakfast, he is doing [place] sausage biscuit and coffee with creamer (3), (he is satisfied to get him to lunch). For lunch, he is doing shrimp plate with baked potato with butter and sour cream, and cole slaw, (he eats all and feels full). For dinner, he is doing [employer] grilled or fried chicken (1 leg and 1 thigh), mashed potatoes, gravy, and 1 cup of corn (feels full).     [name] attended our information session and states he is currently in the action stage of change and ready to dedicate time achieving and maintaining a healthier weight. Jaki is interested in becoming our patient and working on intensive lifestyle modifications including (but not limited to) diet, exercise and weight loss.    Korion states his family eats meals together he thinks his family will eat healthier with  him his desired weight loss is 114 lbs he started gaining weight at approximately age 27 his heaviest weight ever was 330 lbs he has significant food cravings issues  he snacks frequently in the evenings he skips meals frequently he is frequently drinking liquids with calories he  struggles with emotional eating    Fatigue Aidden feels his energy is lower than it should be. This has worsened with weight gain and has not worsened recently. Merrik admits to daytime somnolence and  admits to waking up still tired. Patient is at risk for obstructive sleep apnea. Patent has a history of symptoms of daytime fatigue. Patient generally gets 6 hours of sleep per night, and states they generally have generally restful sleep. Snoring is not present. Apneic episodes are not present. Epworth Sleepiness Score is 4.  Dyspnea on exertion [name] notes increasing shortness of breath with exercising and seems to be worsening over time with weight gain. He notes getting out of breath sooner with activity than he used to. This has not gotten worse recently. EKG-normal sinus rhythm at 70 BPM. [name] denies orthopnea.  Hypertension CASHIUS GRANDSTAFF is a 63 y.o. male with hypertension. Breaker's blood pressure is controlled on amlodipine and Dimesartan. He denies chest pain, chest pressure, or headaches. EKG normal sinus rhythm. He is working on weight loss to help control his blood pressure with the goal of decreasing his risk of heart attack and stroke.   Pre-Diabetes Ran has a diagnosis of pre-diabetes based on his elevated Hgb A1c and has been diagnosed since last summer at least. He was informed this puts him at greater risk of developing diabetes. He is not on any medications and continues to work on diet and exercise to decrease risk of diabetes.   At risk for cardiovascular disease [name]  is at a higher than average risk for cardiovascular disease due to obesity, hypertension, and pre-diabetes. He currently denies any chest pain.  Depression Screen Khair's Food and Mood (modified PHQ-9) score was  Depression screen PHQ 2/9 12/25/2018  Decreased Interest 0  Down, Depressed, Hopeless 0  PHQ - 2 Score 0  Altered sleeping 0  Tired, decreased energy 1  Change in appetite 0  Feeling bad or  failure about yourself  0  Trouble concentrating 0  Moving slowly or fidgety/restless 0  Suicidal thoughts 0  PHQ-9 Score 1  Difficult doing work/chores Not difficult at all    ASSESSMENT AND PLAN:  Other fatigue - Plan: EKG 12-Lead, Vitamin B12, Folate, T3, T4, free, TSH, VITAMIN D 25 Hydroxy (Vit-D Deficiency, Fractures), CBC With Differential  Shortness of breath on exertion - Plan: Lipid Panel With LDL/HDL Ratio  Prediabetes - Plan: Comprehensive metabolic panel, Hemoglobin A1c, Insulin, random  Essential hypertension  Depression screening  At risk for heart disease  Class 3 severe obesity with serious comorbidity and body mass index (BMI) of 40.0 to 44.9 in adult, unspecified obesity type (HCC)  PLAN:  Fatigue [name] was informed that his fatigue may be related to obesity, depression or many other causes. Labs will be ordered, and in the meanwhile Deniz has agreed to work on diet, exercise and weight loss to help with fatigue. Proper sleep hygiene was discussed including the need for 7-8 hours of quality sleep each night. A sleep study was not ordered based on symptoms and Epworth score.  Dyspnea on exertion Fredis's shortness of breath appears to be obesity related and exercise induced. He has agreed to work on weight loss and gradually increase exercise to treat his exercise induced shortness of breath. If Deante follows our instructions and loses weight without improvement of his shortness of breath, we will plan to refer to pulmonology. We will monitor this condition regularly. Jaideep agrees to this plan.  Hypertension We discussed sodium restriction, working on healthy weight loss, and a regular exercise program as the means to achieve improved blood pressure control. [name] agreed with this plan and agreed to follow up as directed. We will continue to monitor his blood pressure as well as his progress with the above lifestyle modifications. Daiki agrees to continue his  medications as prescribed and will watch for signs of hypotension as he continues his lifestyle modifications. EKG was done today and we will check CMP today. Tyr agrees to follow up with our clinic in 2 weeks.  Pre-Diabetes Jayton will continue to work on weight loss, exercise, and decreasing simple carbohydrates in his diet to help decrease the risk of diabetes. We dicussed metformin including benefits and risks. He was informed that eating too many simple carbohydrates or too many calories at one sitting increases the likelihood of GI side effects. Leah declined metformin for now and a prescription was not written today. We will check Hgb A1c and insulin level today. Joshva agrees to follow up with our clinic in 2 weeks as directed to monitor his progress.  Cardiovascular risk counseling Thao was given extended (15 minutes) coronary artery disease prevention counseling today. He is 63 y.o. male and has risk factors for heart disease including obesity, hypertension, and pre-diabetes. We discussed intensive lifestyle modifications today with an emphasis on specific weight loss instructions and strategies. Pt was also informed of the importance of increasing exercise and decreasing saturated fats to help prevent heart disease.  Depression Screen Viraj had a negative depression  screening. Depression is commonly associated with obesity and often results in emotional eating behaviors. We will monitor this closely and work on CBT to help improve the non-hunger eating patterns. Referral to Psychology may be required if no improvement is seen as he continues in our clinic.  Obesity Dhaval is currently in the action stage of change and his goal is to continue with weight loss efforts. I recommend Nikesh begin the structured treatment plan as follows:  He has agreed to keep a food journal with 1550-1700 calories and 115+ grams of protein daily or follow the Category 4 plan Jerric has been instructed  to eventually work up to a goal of 150 minutes of combined cardio and strengthening exercise per week for weight loss and overall health benefits. We discussed the following Behavioral Modification Strategies today: increasing lean protein intake, increasing vegetables and work on meal planning and easy cooking plans, better snacking choices, and planning for success   He was informed of the importance of frequent follow up visits to maximize his success with intensive lifestyle modifications for his multiple health conditions. He was informed we would discuss his lab results at his next visit unless there is a critical issue that needs to be addressed sooner. Johnell agreed to keep his next visit at the agreed upon time to discuss these results.  ALLERGIES: Allergies  Allergen Reactions   Penicillins     REACTION: Penicillin    MEDICATIONS: Current Outpatient Medications on File Prior to Visit  Medication Sig Dispense Refill   ALPRAZolam (XANAX) 0.5 MG tablet Take 0.5 mg by mouth at bedtime as needed.     atorvastatin (LIPITOR) 10 MG tablet TAKE 1 TABLET(10 MG) BY MOUTH DAILY AT 6 PM 90 tablet 1   cetirizine (ZYRTEC) 10 MG tablet Take 10 mg by mouth daily.     CINNAMON PO Take 1 tablet by mouth daily.     fish oil-omega-3 fatty acids 1000 MG capsule Take 1 g by mouth daily.     Flaxseed, Linseed, 1000 MG CAPS Take 1 capsule by mouth daily.     fluticasone (FLONASE) 50 MCG/ACT nasal spray Place 2 sprays into both nostrils daily. 16 g 6   tadalafil (CIALIS) 20 MG tablet Take 20 mg by mouth daily as needed.     warfarin (COUMADIN) 5 MG tablet TAKE AS DIRECTED BY COUMADIN CLINIC. 90 tablet 0   albuterol (PROAIR HFA) 108 (90 Base) MCG/ACT inhaler Inhale 2 puffs into the lungs every 6 (six) hours as needed for wheezing. 1 Inhaler 2   amLODipine (NORVASC) 5 MG tablet TAKE 1 TABLET(5 MG) BY MOUTH DAILY 90 tablet 1   olmesartan (BENICAR) 20 MG tablet TAKE 1 TABLET(20 MG) BY MOUTH DAILY  90 tablet 1   Current Facility-Administered Medications on File Prior to Visit  Medication Dose Route Frequency Provider Last Rate Last Dose   0.9 %  sodium chloride infusion  500 mL Intravenous Continuous Armbruster, [name], MD        PAST MEDICAL HISTORY: Past Medical History:  Diagnosis Date   Anxiety    Arthritis    Asthma    COPD (chronic obstructive pulmonary disease) (HCC)    DVT (deep venous thrombosis) (HCC)    Gout    High cholesterol    Hypertension    Influenza with pneumonia    Lactose intolerance    Obesity    Pre-diabetes    Psychosexual dysfunction with inhibited sexual excitement     PAST SURGICAL HISTORY:  Past Surgical History:  Procedure Laterality Date   HAND SURGERY     right   TONSILLECTOMY AND ADENOIDECTOMY      SOCIAL HISTORY: Social History   Tobacco Use   Smoking status: Never Smoker   Smokeless tobacco: Never Used  Substance Use Topics   Alcohol use: Yes    Comment: ocass- once or twice a month   Drug use: No    FAMILY HISTORY: Family History  Problem Relation Age of Onset   Hypertension Mother    Alzheimer's disease Mother    Hypertension Father    Alcohol abuse Father    Obesity Father    Cancer Sister        "male organs"   Lung cancer Maternal Uncle    Esophageal cancer Maternal Uncle    Hypertension Maternal Grandmother    Alzheimer's disease Maternal Grandmother    Hypertension Maternal Grandfather    Hypertension Paternal Grandmother    Hypertension Paternal Grandfather    Heart disease Brother 39       MI   Alzheimer's disease Paternal Aunt    Colon cancer Neg Hx     ROS: Review of Systems  Constitutional: Positive for malaise/fatigue. Negative for weight loss.  Eyes:       + Wear glasses or contacts + Floaters  Respiratory: Positive for shortness of breath (with exertion) and wheezing.   Cardiovascular: Negative for chest pain and orthopnea.       Negative chest  pressure  Skin: Positive for rash.       + Hair or nail changes  Neurological: Negative for headaches.    PHYSICAL EXAM: Blood pressure 124/77, pulse 69, temperature 98 F (36.7 C), temperature source Oral, height 5\' 11"  (1.803 m), weight (!) 309 lb (140.2 kg), SpO2 99 %. Body mass index is 43.1 kg/m. Physical Exam Vitals signs reviewed.  Constitutional:      Appearance: Normal appearance. He is obese.  HENT:     Head: Normocephalic and atraumatic.     Nose: Nose normal.  Eyes:     General: No scleral icterus.    Extraocular Movements: Extraocular movements intact.  Neck:     Musculoskeletal: Normal range of motion and neck supple.     Comments: No thyromegaly present Cardiovascular:     Rate and Rhythm: Normal rate and regular rhythm.     Pulses: Normal pulses.     Heart sounds: Normal heart sounds.  Pulmonary:     Effort: Pulmonary effort is normal. No respiratory distress.     Breath sounds: Normal breath sounds.  Abdominal:     Palpations: Abdomen is soft.     Tenderness: There is no abdominal tenderness.     Comments: + Obesity  Musculoskeletal: Normal range of motion.     Right lower leg: No edema.     Left lower leg: No edema.  Skin:    General: Skin is warm and dry.  Neurological:     Mental Status: He is alert and oriented to person, place, and time.     Coordination: Coordination normal.  Psychiatric:        Mood and Affect: Mood normal.        Behavior: Behavior normal.     RECENT LABS AND TESTS: BMET    Component Value Date/Time   NA 139 12/25/2018 0000   K 4.3 12/25/2018 0000   CL 104 12/25/2018 0000   CO2 20 12/25/2018 0000   GLUCOSE 98 12/25/2018 0000  GLUCOSE 96 08/05/2018 0946   GLUCOSE 85 05/07/2006 1230   BUN 19 12/25/2018 0000   CREATININE 1.06 12/25/2018 0000   CREATININE 0.85 08/01/2013 1250   CALCIUM 9.1 12/25/2018 0000   GFRNONAA 74 12/25/2018 0000   GFRAA 85 12/25/2018 0000   Lab Results  Component Value Date   HGBA1C 5.7  (H) 12/25/2018   Lab Results  Component Value Date   INSULIN 9.5 12/25/2018   CBC    Component Value Date/Time   WBC 6.5 12/25/2018 0000   WBC 7.9 06/28/2017 1612   RBC 5.39 12/25/2018 0000   RBC 5.31 06/28/2017 1612   HGB 14.5 12/25/2018 0000   HGB 12.7 (L) 10/10/2010 0935   HCT 45.1 12/25/2018 0000   HCT 39.2 10/10/2010 0935   PLT 221.0 06/28/2017 1612   PLT 287 10/10/2010 0935   MCV 84 12/25/2018 0000   MCV 77 (L) 10/10/2010 0935   MCH 26.9 12/25/2018 0000   MCH 27.9 09/09/2016 1436   MCH 24.9 (L) 10/10/2010 0935   MCH 26.8 02/28/2010 2240   MCHC 32.2 12/25/2018 0000   MCHC 32.5 06/28/2017 1612   RDW 14.8 12/25/2018 0000   RDW 16.3 (H) 10/10/2010 0935   LYMPHSABS 1.8 12/25/2018 0000   LYMPHSABS 2.0 10/10/2010 0935   MONOABS 0.6 06/28/2017 1612   EOSABS 0.1 12/25/2018 0000   EOSABS 0.1 10/10/2010 0935   BASOSABS 0.0 12/25/2018 0000   BASOSABS 0.0 10/10/2010 0935   Iron/TIBC/Ferritin/ %Sat No results found for: IRON, TIBC, FERRITIN, IRONPCTSAT Lipid Panel     Component Value Date/Time   CHOL 122 12/25/2018 0000   TRIG 60 12/25/2018 0000   HDL 41 12/25/2018 0000   CHOLHDL 3 08/05/2018 0946   VLDL 12.4 08/05/2018 0946   LDLCALC 69 12/25/2018 0000   Hepatic Function Panel     Component Value Date/Time   PROT 6.4 12/25/2018 0000   ALBUMIN 4.0 12/25/2018 0000   AST 17 12/25/2018 0000   ALT 14 12/25/2018 0000   ALKPHOS 45 12/25/2018 0000   BILITOT 0.6 12/25/2018 0000   BILIDIR 0.1 11/26/2014 1049      Component Value Date/Time   TSH 0.920 12/25/2018 0000   TSH 1.38 06/28/2017 1612   TSH 1.04 06/29/2016 1643    ECG  shows NSR with a rate of 70 BPM INDIRECT CALORIMETER done today shows a VO2 of 330 and a REE of 2294.  His calculated basal metabolic rate is 9379 thus his basal metabolic rate is worse than expected.       OBESITY BEHAVIORAL INTERVENTION VISIT  Today's visit was # 1   Starting weight: 309 lbs Starting date: 12/25/2018 Today's  weight : 309 lbs Today's date: 12/25/2018 Total lbs lost to date: 0    ASK: We discussed the diagnosis of obesity with [name] today and [name] agreed to give [name] permission to discuss obesity behavioral modification therapy today.  ASSESS: Sorin has the diagnosis of obesity and his BMI today is 43.12 Lonn is in the action stage of change   ADVISE: Nuri was educated on the multiple health risks of obesity as well as the benefit of weight loss to improve his health. He was advised of the need for long term treatment and the importance of lifestyle modifications to improve his current health and to decrease his risk of future health problems.  AGREE: Multiple dietary modification options and treatment options were discussed and  Krishang agreed to follow the recommendations documented in the above note.  ARRANGE: Vane was educated on the importance of frequent visits to treat obesity as outlined per CMS and USPSTF guidelines and agreed to schedule his next follow up appointment today.  I, Trixie Dredge, am acting as transcriptionist for Ilene Qua, MD  I have reviewed the above documentation for accuracy and completeness, and I agree with the above. - Ilene Qua, MD

## 2019-01-06 NOTE — Progress Notes (Signed)
Office: [phone]  /  Fax: [phone]    Date: January 07, 2019   Appointment Start Time: 3:00pm Duration: 31 minutes Provider: Glennie Isle, Psy.D. Type of Session: Intake for Individual Therapy  Location of Patient: Work Location of Provider: Provider's Home Type of Contact: Telepsychological Visit via [employer]  Informed Consent: Prior to proceeding with today's appointment, two pieces of identifying information were obtained from Daniel Powers to verify identity. In addition, Daniel Powers's physical location at the time of this appointment was obtained. Daniel Powers reported he was at work and provided the address. In the event of technical difficulties, Daniel Powers shared a phone number he could be reached at. Daniel Powers and this provider participated in today's telepsychological service. Also, Daniel Powers denied anyone else being present in the room or on the WebEx appointment.   The provider's role was explained to Daniel Powers. The provider reviewed and discussed issues of confidentiality, privacy, and limits therein (e.g., reporting obligations). In addition to verbal informed consent, written informed consent for psychological services was obtained from Daniel Powers prior to the initial intake interview. Written consent included information concerning the practice, financial arrangements, and confidentiality and patients' rights. Since the clinic is not a 24/7 crisis center, mental health emergency resources were shared, and the provider explained MyChart, e-mail, voicemail, and/or other messaging systems should be utilized only for non-emergency reasons. This provider also explained that information obtained during appointments will be placed in Daniel Powers's medical record in a confidential manner and relevant information will be shared with other providers at [facility] that he meets with for coordination of care. Daniel Powers verbally acknowledged understanding of the aforementioned, and agreed to use mental  health emergency resources discussed if needed. Moreover, Daniel Powers agreed information may be shared with other [facility] providers as needed for coordination of care. By signing the service agreement document, Daniel Powers provided written consent for coordination of care.   Prior to initiating telepsychological services, Daniel Powers was provided with an informed consent document, which included the development of a safety plan (i.e., an emergency contact and emergency resources) in the event of an emergency/crisis. Daniel Powers expressed understanding of the rationale of the safety plan and provided consent for this provider to reach out to his emergency contact in the event of an emergency/crisis. Daniel Powers returned the completed consent form prior to today's appointment. This provider verbally reviewed the consent form during today's appointment prior to proceeding with the appointment. Daniel Powers verbally acknowledged understanding that he is ultimately responsible for understanding his insurance benefits as it relates to reimbursement of telepsychological and in-person services. This provider also reviewed confidentiality, as it relates to telepsychological services, as well as the rationale for telepsychological services. More specifically, this provider's clinic is limiting in-person visits due to COVID-19. Therapeutic services will resume to in-person appointments once deemed appropriate. Daniel Powers expressed understanding regarding the rationale for telepsychological services. In addition, this provider explained the telepsychological services informed consent document would be considered an addendum to the initial consent document/service agreement. Daniel Powers verbally consented to proceed.   Chief Complaint/HPI: Daniel Powers was referred by Daniel Powers. During the initial appointment with Daniel Powers at [facility] on December 25, 2018, Daniel Powers reported experiencing the following: significant  food cravings issues , snacking frequently in the evenings, frequently drinking liquids with calories, struggling with emotional eating and skipping meals frequently.   During today's appointment, Daniel Powers reported "I don't know if I do emotional eating." Daniel Powers was verbally administered a questionnaire assessing various behaviors related  to emotional eating. Daniel Powers endorsed the following: overeat when you are celebrating, find food is comforting to you, not worry about what you eat when you are in a good mood and eat as a reward. He shared he craves the following: ice cream and cookies. Daniel Powers stated he is unsure regarding the onset of emotional eating and is also unsure regarding the frequency. He believes the last episode of emotional eating was before he started with the clinic. Daniel Powers denied a history of binge eating and also denied mindless eating. Daniel Powers denied a history of restricting food intake, purging and engagement in other compensatory strategies, and has never been diagnosed with an eating disorder. He also denied a history of treatment for emotional eating. Moreover, Daniel Powers indicated going out to eat can trigger emotional eating. He could not identify what makes emotional eating better. Daniel Powers shared his "worst vice" is going through 6-7 cookies before stopping. Furthermore, Daniel Powers denied other problems of concern. However, Daniel Powers discussed difficulty following the structured meal plan due to his current schedule; it was recommended he discuss this further with Dr. Adair Patter. He also discussed feeling guilty after eating sweets and described experiencing anticipatory anxiety related to making the necessary changes to follow the structured meal plan.   Mental Status Examination:  Appearance: neat Behavior: cooperative Mood: euthymic Affect: mood congruent Speech: normal in rate, volume, and tone Eye Contact: appropriate Psychomotor Activity: appropriate Thought Process: linear, logical, and  goal directed  Content/Perceptual Disturbances: denies suicidal and homicidal ideation, plan, and intent and no hallucinations, delusions, bizarre thinking or behavior reported or observed Orientation: time, person, place and purpose of appointment Cognition/Sensorium: memory, attention, language, and fund of knowledge intact  Insight: fair Judgment: fair  Family & Psychosocial History: Yosiah reported he has been married for 6 years and they have one daughter (age 25). 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 Additionally, Jacques shared his highest level of education obtained is a Sports coach degree. Currently, Perri's social support system consists of his friend, family, and the [place]. Moreover, Kylyn stated he resides with his wife.   Medical History:  Past Medical History:  Diagnosis Date   Anxiety    Arthritis    Asthma    COPD (chronic obstructive pulmonary disease) (HCC)    DVT (deep venous thrombosis) (HCC)    Gout    High cholesterol    Hypertension    Influenza with pneumonia    Lactose intolerance    Obesity    Pre-diabetes    Psychosexual dysfunction with inhibited sexual excitement    Past Surgical History:  Procedure Laterality Date   HAND SURGERY     right   TONSILLECTOMY AND ADENOIDECTOMY     Current Outpatient Medications on File Prior to Visit  Medication Sig Dispense Refill   albuterol (PROAIR HFA) 108 (90 Base) MCG/ACT inhaler Inhale 2 puffs into the lungs every 6 (six) hours as needed for wheezing. 1 Inhaler 2   ALPRAZolam (XANAX) 0.5 MG tablet Take 0.5 mg by mouth at bedtime as needed.     amLODipine (NORVASC) 5 MG tablet TAKE 1 TABLET(5 MG) BY MOUTH DAILY 90 tablet 1   atorvastatin (LIPITOR) 10 MG tablet TAKE 1 TABLET(10 MG) BY MOUTH DAILY AT 6 PM 90 tablet 1   cetirizine (ZYRTEC) 10 MG tablet Take 10 mg by mouth daily.     CINNAMON PO Take 1 tablet by mouth daily.     fish oil-omega-3 fatty acids 1000 MG capsule Take 1 g  by mouth daily.     Flaxseed, Linseed, 1000 MG CAPS Take 1 capsule by mouth daily.     fluticasone (FLONASE) 50 MCG/ACT nasal spray Place 2 sprays into both nostrils daily. 16 g 6   olmesartan (BENICAR) 20 MG tablet TAKE 1 TABLET(20 MG) BY MOUTH DAILY 90 tablet 1   tadalafil (CIALIS) 20 MG tablet Take 20 mg by mouth daily as needed.     warfarin (COUMADIN) 5 MG tablet TAKE AS DIRECTED BY COUMADIN CLINIC. 90 tablet 0   Current Facility-Administered Medications on File Prior to Visit  Medication Dose Route Frequency Provider Last Rate Last Dose   0.9 %  sodium chloride infusion  500 mL Intravenous Continuous Armbruster, [name], MD      Daniel Powers denied a history of head injuries and loss of consciousness.   Mental Health History: Lynwood denied a history of mental health treatment, including therapeutic services. Jaice denied a history of hospitalizations for psychiatric concerns, and has never met with a psychiatrist. Notably, chart review revealed Alonte went to the ER and was diagnosed with anxiety in 2013. Sunny explained various family members were going through medical concerns along with work stressors contributed to the anxiety. Regarding psychotropic medications, Demeco stated he is prescribed Xanax and was previously prescribed Lexapro. Currently, he does not take any psychotropic medications. Bethel endorsed a family history of mental health related concerns. More specifically his mother and maternal grandmother were diagnosed with Alzheimer's Disease. Olaoluwa denied a trauma history, including psychological, physical  and sexual abuse, as well as neglect.   Ozias described his typical mood as "good, general happy." Aside from concerns noted above and endorsed on the GAD-7, Steadman reported worry about the pandemic. Usama endorsed "very little" alcohol use. More specifically, "maybe once every two or three months."  He denied tobacco use.  He denied illicit substance use. Regarding  caffeine intake, Kaisyn reported he used to drink coffee. Furthermore, Daniel Powers denied experiencing the following: obsessions and compulsions, hopelessness, hallucinations and delusions, paranoia, mania and decreased motivation. He also denied history of and current suicidal ideation, plan, and intent; history of and current homicidal ideation, plan, and intent; and history of and current engagement in self-harm. Notably, he endorsed a history of panic attacks in 2013.   The following strengths were reported by Daniel Powers: "pretty even personality." The following strengths were observed by this provider: ability to express thoughts and feelings during the therapeutic session, ability to establish and benefit from a therapeutic relationship, ability to learn and practice coping skills, willingness to work toward established goal(s) with the clinic and ability to engage in reciprocal conversation.  Legal History: Witt denied a history of legal involvement.   Structured Assessment Results: The Patient Health Questionnaire-9 (PHQ-9) is a self-report measure that assesses symptoms and severity of depression over the course of the last two weeks. Almalik obtained a score of 0. Little interest or pleasure in doing things 0  Feeling down, depressed, or hopeless 0  Trouble falling or staying asleep, or sleeping too much 0  Feeling tired or having little energy 0  Poor appetite or overeating 0  Feeling bad about yourself --- or that you are a failure or have let yourself or your family down 0  Trouble concentrating on things, such as reading the newspaper or watching television 0  Moving or speaking so slowly that other people could have noticed? Or the opposite --- being so fidgety or restless that you have been moving around a lot more  than usual 0  Thoughts that you would be better off dead or hurting yourself in some way 0  PHQ-9 Score 0    The Generalized Anxiety Disorder-7 (GAD-7) is a brief self-report  measure that assesses symptoms of anxiety over the course of the last two weeks. Kean obtained a score of 1 suggesting minimal anxiety. Hayes finds the endorsed symptoms to be not difficult at all. Feeling nervous, anxious, on edge 1  Not being able to stop or control worrying 0  Worrying too much about different things 0  Trouble relaxing 0  Being so restless that it's hard to sit still 0  Becoming easily annoyed or irritable 0  Feeling afraid as if something awful might happen 0  GAD-7 Score 1   Interventions: A chart review was conducted prior to the clinical intake interview. The PHQ-9, and GAD-7 were verbally administered as well as a Mood and Food questionnaire to assess various behaviors related to emotional eating. Throughout session, empathic reflections and validation was provided. Continuing treatment with this provider was discussed; however, Delrico declined future appointments at this time. Nevertheless, psychoeducation regarding emotional versus physical hunger was provided. Roshun was sent a handout via e-mail to increase awareness of hunger patterns and subsequent eating. Daniel Powers provided verbal consent during today's appointment for this provider to send the handout via e-mail.   Provisional DSM-5 Diagnosis: 300.09 (F41.8) Other Specified Anxiety Disorder, Emotional Eating Behaviors  Plan: Bufford declined future appointments at this time, but verbally acknowledged understanding that he may request a follow-up appointment in the future.

## 2019-01-07 ENCOUNTER — Ambulatory Visit (INDEPENDENT_AMBULATORY_CARE_PROVIDER_SITE_OTHER): Payer: 59 | Admitting: Psychology

## 2019-01-07 DIAGNOSIS — F418 Other specified anxiety disorders: Secondary | ICD-10-CM

## 2019-01-08 ENCOUNTER — Ambulatory Visit (INDEPENDENT_AMBULATORY_CARE_PROVIDER_SITE_OTHER): Payer: 59 | Admitting: Family Medicine

## 2019-01-08 ENCOUNTER — Other Ambulatory Visit: Payer: Self-pay

## 2019-01-08 ENCOUNTER — Encounter (INDEPENDENT_AMBULATORY_CARE_PROVIDER_SITE_OTHER): Payer: Self-pay | Admitting: Family Medicine

## 2019-01-08 VITALS — BP 146/75 | HR 74 | Temp 98.7°F | Ht 71.0 in | Wt 307.0 lb

## 2019-01-08 DIAGNOSIS — E119 Type 2 diabetes mellitus without complications: Secondary | ICD-10-CM | POA: Diagnosis not present

## 2019-01-08 DIAGNOSIS — Z6841 Body Mass Index (BMI) 40.0 and over, adult: Secondary | ICD-10-CM

## 2019-01-08 DIAGNOSIS — E559 Vitamin D deficiency, unspecified: Secondary | ICD-10-CM | POA: Diagnosis not present

## 2019-01-08 DIAGNOSIS — I82401 Acute embolism and thrombosis of unspecified deep veins of right lower extremity: Secondary | ICD-10-CM | POA: Diagnosis not present

## 2019-01-08 DIAGNOSIS — Z9189 Other specified personal risk factors, not elsewhere classified: Secondary | ICD-10-CM

## 2019-01-08 MED ORDER — VITAMIN D (ERGOCALCIFEROL) 1.25 MG (50000 UNIT) PO CAPS: 50000.0000 [IU] | ORAL_CAPSULE | ORAL | 0 refills | Status: DC

## 2019-01-09 NOTE — Progress Notes (Signed)
Office: [phone]  /  Fax: [phone]   HPI:   Chief Complaint: OBESITY Daniel Powers is here to discuss his progress with his obesity treatment plan. He is on the keep a food journal with 1550-1700 calories and 115+ grams of protein daily or follow the Category 4 plan and is following his eating plan approximately 80 % of the time. He states he is exercising 0 minutes 0 times per week. Daniel Powers is often skipping 1 meal a day secondary to scheduling. He did eat breakfast and often would eat lunch for dinner. He denies being hungry. He found portion sizes to be doable.  His weight is (!) 307 lb (139.3 kg) today and has had a weight loss of 2 pounds over a period of 2 weeks since his last visit. He has lost 2 lbs since starting treatment with [name].  Vitamin D Deficiency Daniel Powers has a diagnosis of vitamin D deficiency. He is not currently taking OTC Vit D. He notes fatigue and denies nausea, vomiting or muscle weakness.  At risk for osteopenia and osteoporosis Daniel Powers is at higher risk of osteopenia and osteoporosis due to vitamin D deficiency.   Pre-Diabetes Daniel Powers has a diagnosis of pre-diabetes based on his elevated Hgb A1c of 5.7 and insulin of 9.5. He was informed this puts him at greater risk of developing diabetes. He is not taking metformin currently and continues to work on diet and exercise to decrease risk of diabetes. He denies nausea or hypoglycemia.  Recurrent DVT Daniel Powers is on coumadin and his last INR was of 2.4.  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 Daniel Powers agrees to start prescription Vit D 50,000 IU every week #4 with no refills. He will follow up for routine testing of vitamin D, at least 2-3 times per year. He was informed of the risk of over-replacement of vitamin D and agrees to not increase his dose unless he discusses this with [name] first. Daniel Powers agrees to follow up with our clinic in 2 weeks.  At risk for osteopenia and osteoporosis Daniel Powers was given extended (30 minutes) osteoporosis prevention counseling today. Daniel Powers is at risk for osteopenia and osteoporsis due to his vitamin D deficiency. He was encouraged to take his vitamin D and follow his higher calcium diet and increase strengthening exercise to help strengthen his bones and decrease his risk of osteopenia and osteoporosis.  Pre-Diabetes Daniel Powers will continue to work on weight loss, exercise, and decreasing simple carbohydrates in his diet to help decrease the risk of diabetes. We dicussed metformin including benefits and risks. He was informed that eating too many simple carbohydrates or too many calories at one sitting increases the likelihood of GI side effects. Daniel Powers declined metformin for now and a prescription was not written today. We will repeat labs in 3 months. Daniel Powers agrees to follow up with our clinic in 2 weeks as directed to monitor his progress.  Recurrent DVT We will follow up at next appointment about INR from next week coumadin appointment. Daniel Powers agrees to  follow up with our clinic in 2 weeks.  Obesity Daniel Powers is currently in the action stage of change. As such, his goal is to continue with weight loss efforts He has agreed to keep a food journal with 1550-1700 calories and 115+ grams of protein daily or follow the Category 4 plan Aimar has been instructed to work up to a goal of 150 minutes of combined cardio and strengthening exercise per week for weight loss and overall  health benefits. We discussed the following Behavioral Modification Strategies today: increasing lean protein intake, increasing vegetables and work on meal planning and easy cooking plans, keeping healthy foods in the home, and planning for success   Daniel Powers has agreed to follow up with our clinic in 2 weeks. He was informed of the importance of frequent follow up visits to maximize his success with intensive lifestyle modifications for his multiple health conditions.  ALLERGIES: Allergies  Allergen Reactions   Penicillins     REACTION: Penicillin    MEDICATIONS: Current Outpatient Medications on File Prior to Visit  Medication Sig Dispense Refill   albuterol (PROAIR HFA) 108 (90 Base) MCG/ACT inhaler Inhale 2 puffs into the lungs every 6 (six) hours as needed for wheezing. 1 Inhaler 2   ALPRAZolam (XANAX) 0.5 MG tablet Take 0.5 mg by mouth at bedtime as needed.     amLODipine (NORVASC) 5 MG tablet TAKE 1 TABLET(5 MG) BY MOUTH DAILY 90 tablet 1   atorvastatin (LIPITOR) 10 MG tablet TAKE 1 TABLET(10 MG) BY MOUTH DAILY AT 6 PM 90 tablet 1   cetirizine (ZYRTEC) 10 MG tablet Take 10 mg by mouth daily.     CINNAMON PO Take 1 tablet by mouth daily.     fish oil-omega-3 fatty acids 1000 MG capsule Take 1 g by mouth daily.     Flaxseed, Linseed, 1000 MG CAPS Take 1 capsule by mouth daily.     fluticasone (FLONASE) 50 MCG/ACT nasal spray Place 2 sprays into both nostrils daily. 16 g 6   olmesartan (BENICAR) 20 MG tablet TAKE 1 TABLET(20 MG) BY MOUTH DAILY 90 tablet 1   tadalafil (CIALIS) 20 MG tablet Take 20 mg by mouth daily as needed.     warfarin (COUMADIN) 5 MG tablet TAKE AS DIRECTED BY COUMADIN CLINIC. 90 tablet 0   Current Facility-Administered Medications on File Prior to Visit  Medication Dose Route Frequency Provider Last Rate Last Dose   0.9 %  sodium chloride infusion  500 mL Intravenous Continuous Armbruster, [name], MD        PAST MEDICAL HISTORY: Past Medical  History:  Diagnosis Date   Anxiety    Arthritis    Asthma    COPD (chronic obstructive pulmonary disease) (HCC)    DVT (deep venous thrombosis) (HCC)    Gout    High cholesterol    Hypertension    Influenza with pneumonia    Lactose intolerance    Obesity    Pre-diabetes    Psychosexual dysfunction with inhibited sexual excitement     PAST SURGICAL HISTORY: Past Surgical History:  Procedure Laterality Date   HAND SURGERY     right   TONSILLECTOMY AND ADENOIDECTOMY      SOCIAL HISTORY: Social History   Tobacco Use   Smoking status: Never Smoker   Smokeless tobacco: Never Used  Substance Use Topics   Alcohol use: Yes    Comment: ocass- once or twice a month   Drug use: No    FAMILY HISTORY: Family  History  Problem Relation Age of Onset   Hypertension Mother    Alzheimer's disease Mother    Hypertension Father    Alcohol abuse Father    Obesity Father    Cancer Sister        "male organs"   Lung cancer Maternal Uncle    Esophageal cancer Maternal Uncle    Hypertension Maternal Grandmother    Alzheimer's disease Maternal Grandmother    Hypertension Maternal Grandfather    Hypertension Paternal Grandmother    Hypertension Paternal Grandfather    Heart disease Brother 33       MI   Alzheimer's disease Paternal Aunt    Colon cancer Neg Hx     ROS: Review of Systems  Constitutional: Positive for malaise/fatigue and weight loss.  Gastrointestinal: Negative for nausea and vomiting.  Musculoskeletal:       Negative muscle weakness  Endo/Heme/Allergies:       Negative hypoglycemia    PHYSICAL EXAM: Blood pressure (!) 146/75, pulse 74, temperature 98.7 F (37.1 C), height 5\' 11"  (1.803 m), weight (!) 307 lb (139.3 kg), SpO2 97 %. Body mass index is 42.82 kg/m. Physical Exam Vitals signs reviewed.  Constitutional:      Appearance: Normal appearance. He is obese.  Cardiovascular:     Rate and Rhythm: Normal rate.      Pulses: Normal pulses.  Pulmonary:     Effort: Pulmonary effort is normal.     Breath sounds: Normal breath sounds.  Musculoskeletal: Normal range of motion.  Skin:    General: Skin is warm and dry.  Neurological:     Mental Status: He is alert and oriented to person, place, and time.  Psychiatric:        Mood and Affect: Mood normal.        Behavior: Behavior normal.     RECENT LABS AND TESTS: BMET    Component Value Date/Time   NA 139 12/25/2018 0000   K 4.3 12/25/2018 0000   CL 104 12/25/2018 0000   CO2 20 12/25/2018 0000   GLUCOSE 98 12/25/2018 0000   GLUCOSE 96 08/05/2018 0946   GLUCOSE 85 05/07/2006 1230   BUN 19 12/25/2018 0000   CREATININE 1.06 12/25/2018 0000   CREATININE 0.85 08/01/2013 1250   CALCIUM 9.1 12/25/2018 0000   GFRNONAA 74 12/25/2018 0000   GFRAA 85 12/25/2018 0000   Lab Results  Component Value Date   HGBA1C 5.7 (H) 12/25/2018   HGBA1C 6.1 08/05/2018   HGBA1C 6.2 02/01/2018   Lab Results  Component Value Date   INSULIN 9.5 12/25/2018   CBC    Component Value Date/Time   WBC 6.5 12/25/2018 0000   WBC 7.9 06/28/2017 1612   RBC 5.39 12/25/2018 0000   RBC 5.31 06/28/2017 1612   HGB 14.5 12/25/2018 0000   HGB 12.7 (L) 10/10/2010 0935   HCT 45.1 12/25/2018 0000   HCT 39.2 10/10/2010 0935   PLT 221.0 06/28/2017 1612   PLT 287 10/10/2010 0935   MCV 84 12/25/2018 0000   MCV 77 (L) 10/10/2010 0935   MCH 26.9 12/25/2018 0000   MCH 27.9 09/09/2016 1436   MCH 24.9 (L) 10/10/2010 0935   MCH 26.8 02/28/2010 2240   MCHC 32.2 12/25/2018 0000   MCHC 32.5 06/28/2017 1612   RDW 14.8 12/25/2018 0000   RDW 16.3 (H) 10/10/2010 0935   LYMPHSABS 1.8 12/25/2018 0000   LYMPHSABS 2.0 10/10/2010 0935   MONOABS 0.6 06/28/2017 1612   EOSABS 0.1 12/25/2018  0000   EOSABS 0.1 10/10/2010 0935   BASOSABS 0.0 12/25/2018 0000   BASOSABS 0.0 10/10/2010 0935   Iron/TIBC/Ferritin/ %Sat No results found for: IRON, TIBC, FERRITIN, IRONPCTSAT Lipid Panel       Component Value Date/Time   CHOL 122 12/25/2018 0000   TRIG 60 12/25/2018 0000   HDL 41 12/25/2018 0000   CHOLHDL 3 08/05/2018 0946   VLDL 12.4 08/05/2018 0946   LDLCALC 69 12/25/2018 0000   Hepatic Function Panel     Component Value Date/Time   PROT 6.4 12/25/2018 0000   ALBUMIN 4.0 12/25/2018 0000   AST 17 12/25/2018 0000   ALT 14 12/25/2018 0000   ALKPHOS 45 12/25/2018 0000   BILITOT 0.6 12/25/2018 0000   BILIDIR 0.1 11/26/2014 1049      Component Value Date/Time   TSH 0.920 12/25/2018 0000   TSH 1.38 06/28/2017 1612   TSH 1.04 06/29/2016 1643      OBESITY BEHAVIORAL INTERVENTION VISIT  Today's visit was # 2   Starting weight: 309 lbs Starting date: 12/25/2018 Today's weight : 307 lbs Today's date: 01/08/2019 Total lbs lost to date: 2    ASK: We discussed the diagnosis of obesity with [name] today and [name] agreed to give [name] permission to discuss obesity behavioral modification therapy today.  ASSESS: Damond has the diagnosis of obesity and his BMI today is 42.84 Calix is in the action stage of change   ADVISE: Lukus was educated on the multiple health risks of obesity as well as the benefit of weight loss to improve his health. He was advised of the need for long term treatment and the importance of lifestyle modifications to improve his current health and to decrease his risk of future health problems.  AGREE: Multiple dietary modification options and treatment options were discussed and  Keighan agreed to follow the recommendations documented in the above note.  ARRANGE: Luisfernando was educated on the importance of frequent visits to treat obesity as outlined per CMS and USPSTF guidelines and agreed to schedule his next follow up appointment today.  I, Daniel Powers, am acting as transcriptionist for Daniel Qua, MD  I have reviewed the above documentation for accuracy and completeness, and I agree with the above. - Daniel Qua,  MD

## 2019-01-14 ENCOUNTER — Ambulatory Visit (INDEPENDENT_AMBULATORY_CARE_PROVIDER_SITE_OTHER): Payer: 59

## 2019-01-14 ENCOUNTER — Other Ambulatory Visit: Payer: Self-pay

## 2019-01-14 DIAGNOSIS — Z7901 Long term (current) use of anticoagulants: Secondary | ICD-10-CM | POA: Diagnosis not present

## 2019-01-14 LAB — POCT INR: INR: 2 (ref 2.0–3.0)

## 2019-01-14 NOTE — Progress Notes (Signed)
Pt here for INR check per Dr. Etter Sjogren  Goal INR = 2.0-3.0  Last INR =2.4  Pt currently takes Coumadin 5mg  daily  Pt denies recent antibiotics, no dietary changes and no unusual bruising / bleeding.  INR today = 2.0  Pt advised per Dr. Etter Sjogren, continue current regimen and follow up in 1 month.

## 2019-01-23 ENCOUNTER — Other Ambulatory Visit: Payer: Self-pay

## 2019-01-23 ENCOUNTER — Encounter (INDEPENDENT_AMBULATORY_CARE_PROVIDER_SITE_OTHER): Payer: Self-pay | Admitting: Family Medicine

## 2019-01-23 ENCOUNTER — Ambulatory Visit (INDEPENDENT_AMBULATORY_CARE_PROVIDER_SITE_OTHER): Payer: 59 | Admitting: Family Medicine

## 2019-01-23 VITALS — BP 125/80 | HR 67 | Temp 98.1°F | Ht 71.0 in | Wt 305.0 lb

## 2019-01-23 DIAGNOSIS — Z6841 Body Mass Index (BMI) 40.0 and over, adult: Secondary | ICD-10-CM | POA: Diagnosis not present

## 2019-01-23 DIAGNOSIS — I10 Essential (primary) hypertension: Secondary | ICD-10-CM

## 2019-01-23 DIAGNOSIS — R7303 Prediabetes: Secondary | ICD-10-CM

## 2019-01-27 NOTE — Progress Notes (Signed)
Office: [phone]  /  Fax: [phone]   HPI:   Chief Complaint: OBESITY Daniel Powers is here to discuss his progress with his obesity treatment plan. He is on the keep a food journal with 1550-1700 calories and 115+ grams of protein daily or follow the Category 4 plan and is following his eating plan approximately 75 % of the time. He states he is walking for 60 minutes 2 times per week. Daniel Powers's last few weeks were better in terms of eating all of the food on the plan. He still voices it is hard to get all food in on the plan. He is occasionally doing sandwiches for dinner if he skips lunch. He denies hunger. He is doing chocolate chip cookies for snacks.  His weight is (!) 305 lb (138.3 kg) today and has had a weight loss of 2 pounds over a period of 2 weeks since his last visit. He has lost 4 lbs since starting treatment with [name].  Hypertension [name] is a 63 y.o. male with hypertension. [name]'s blood pressure is controlled today. He denies chest pain, chest pressure, or headaches. He is working on weight loss to help control his blood pressure with the goal of decreasing his risk of heart attack and stroke.   Pre-Diabetes Daniel Powers has a diagnosis of pre-diabetes based on his elevated Hgb A1c and was informed this puts him at greater risk of developing diabetes. He notes carbohydrate cravings for chocolate chip cookies. He is not taking metformin currently and continues to work on diet and exercise to decrease risk of diabetes. He denies nausea or hypoglycemia.  ASSESSMENT AND PLAN:  Essential hypertension  Prediabetes  Class 3 severe obesity with serious comorbidity and body mass index (BMI) of 40.0 to 44.9 in adult, unspecified obesity type ([name])  PLAN:  Hypertension We discussed sodium restriction, working on healthy weight loss, and a regular exercise program as the means to achieve improved blood pressure control. [name] agreed with this plan and agreed to follow up as  directed. We will continue to monitor his blood pressure as well as his progress with the above lifestyle modifications. [place] agrees to continue taking amlodipine and olmesartan, and will watch for signs of hypotension as he continues his lifestyle modifications. Yovanni agrees to follow up with our clinic in 2 weeks.   Pre-Diabetes [name] will continue to work on weight loss, exercise, and decreasing simple carbohydrates in his diet to help decrease the risk of diabetes. We dicussed metformin including benefits and risks. He was informed that eating too many simple carbohydrates or too many calories at one sitting increases the likelihood of GI side effects. Farron declined metformin for now and a prescription was not written today. We will repeat labs in late September. Julion agrees to follow up with our clinic in 2 weeks as directed to monitor his progress.  I spent > than 50% of the 15 minute visit on counseling as documented in the note.  Obesity Daniel Powers is currently in the action stage of change. As such, his goal is to continue with weight loss efforts He has agreed to follow the Category 4 plan Daniel Powers has been instructed to work up to a goal of 150 minutes of combined cardio and strengthening exercise per week for weight loss and overall health benefits. We discussed the following Behavioral Modification Strategies today: increasing lean protein intake, increasing vegetables and work on meal planning and easy cooking plans, keeping healthy foods in the home, and planning for success  Daniel Powers has agreed to follow up with our clinic in 2 weeks. He was informed of the importance of frequent follow up visits to maximize his success with intensive lifestyle modifications for his multiple health conditions.  ALLERGIES: Allergies  Allergen Reactions  . Penicillins     REACTION: Penicillin    MEDICATIONS: Current Outpatient Medications on File Prior to Visit  Medication Sig Dispense Refill   . albuterol (PROAIR HFA) 108 (90 Base) MCG/ACT inhaler Inhale 2 puffs into the lungs every 6 (six) hours as needed for wheezing. 1 Inhaler 2  . ALPRAZolam (XANAX) 0.5 MG tablet Take 0.5 mg by mouth at bedtime as needed.    [name] amLODipine (NORVASC) 5 MG tablet TAKE 1 TABLET(5 MG) BY MOUTH DAILY 90 tablet 1  . atorvastatin (LIPITOR) 10 MG tablet TAKE 1 TABLET(10 MG) BY MOUTH DAILY AT 6 PM 90 tablet 1  . cetirizine (ZYRTEC) 10 MG tablet Take 10 mg by mouth daily.    [name] CINNAMON PO Take 1 tablet by mouth daily.    . fish oil-omega-3 fatty acids 1000 MG capsule Take 1 g by mouth daily.    . Flaxseed, Linseed, 1000 MG CAPS Take 1 capsule by mouth daily.    . fluticasone (FLONASE) 50 MCG/ACT nasal spray Place 2 sprays into both nostrils daily. 16 g 6  . olmesartan (BENICAR) 20 MG tablet TAKE 1 TABLET(20 MG) BY MOUTH DAILY 90 tablet 1  . tadalafil (CIALIS) 20 MG tablet Take 20 mg by mouth daily as needed.    . Vitamin D, Ergocalciferol, (DRISDOL) 1.25 MG (50000 UT) CAPS capsule Take 1 capsule (50,000 Units total) by mouth every 7 (seven) days. 4 capsule 0  . warfarin (COUMADIN) 5 MG tablet TAKE AS DIRECTED BY COUMADIN CLINIC. 90 tablet 0   Current Facility-Administered Medications on File Prior to Visit  Medication Dose Route Frequency Provider Last Rate Last Dose  . 0.9 %  sodium chloride infusion  500 mL Intravenous Continuous Armbruster, [name], MD        PAST MEDICAL HISTORY: Past Medical History:  Diagnosis Date  . Anxiety   . Arthritis   . Asthma   . COPD (chronic obstructive pulmonary disease) ([name])   . DVT (deep venous thrombosis) ([name])   . Gout   . High cholesterol   . Hypertension   . Influenza with pneumonia   . Lactose intolerance   . Obesity   . Pre-diabetes   . Psychosexual dysfunction with inhibited sexual excitement     PAST SURGICAL HISTORY: Past Surgical History:  Procedure Laterality Date  . HAND SURGERY     right  . TONSILLECTOMY AND ADENOIDECTOMY      SOCIAL  HISTORY: Social History   Tobacco Use  . Smoking status: Never Smoker  . Smokeless tobacco: Never Used  Substance Use Topics  . Alcohol use: Yes    Comment: ocass- once or twice a month  . Drug use: No    FAMILY HISTORY: Family History  Problem Relation Age of Onset  . Hypertension Mother   . Alzheimer's disease Mother   . Hypertension Father   . Alcohol abuse Father   . Obesity Father   . Cancer Sister        "male organs"  . Lung cancer Maternal Uncle   . Esophageal cancer Maternal Uncle   . Hypertension Maternal Grandmother   . Alzheimer's disease Maternal Grandmother   . Hypertension Maternal Grandfather   . Hypertension Paternal Grandmother   . Hypertension  Paternal Grandfather   . Heart disease Brother 75       MI  . Alzheimer's disease Paternal Aunt   . Colon cancer Neg Hx     ROS: Review of Systems  Constitutional: Positive for weight loss.  Cardiovascular: Negative for chest pain.       Negative chest pressure  Gastrointestinal: Negative for nausea.  Neurological: Negative for headaches.  Endo/Heme/Allergies:       Negative hypoglycemia    PHYSICAL EXAM: Blood pressure 125/80, pulse 67, temperature 98.1 F (36.7 C), temperature source Oral, height 5\' 11"  (1.803 m), weight (!) 305 lb (138.3 kg), SpO2 98 %. Body mass index is 42.54 kg/m. Physical Exam Vitals signs reviewed.  Constitutional:      Appearance: Normal appearance. He is obese.  Cardiovascular:     Rate and Rhythm: Normal rate.     Pulses: Normal pulses.  Pulmonary:     Effort: Pulmonary effort is normal.     Breath sounds: Normal breath sounds.  Musculoskeletal: Normal range of motion.  Skin:    General: Skin is warm and dry.  Neurological:     Mental Status: He is alert and oriented to person, place, and time.  Psychiatric:        Mood and Affect: Mood normal.        Behavior: Behavior normal.     RECENT LABS AND TESTS: BMET    Component Value Date/Time   NA 139  12/25/2018 0000   K 4.3 12/25/2018 0000   CL 104 12/25/2018 0000   CO2 20 12/25/2018 0000   GLUCOSE 98 12/25/2018 0000   GLUCOSE 96 08/05/2018 0946   GLUCOSE 85 05/07/2006 1230   BUN 19 12/25/2018 0000   CREATININE 1.06 12/25/2018 0000   CREATININE 0.85 08/01/2013 1250   CALCIUM 9.1 12/25/2018 0000   GFRNONAA 74 12/25/2018 0000   GFRAA 85 12/25/2018 0000   Lab Results  Component Value Date   HGBA1C 5.7 (H) 12/25/2018   HGBA1C 6.1 08/05/2018   HGBA1C 6.2 02/01/2018   Lab Results  Component Value Date   INSULIN 9.5 12/25/2018   CBC    Component Value Date/Time   WBC 6.5 12/25/2018 0000   WBC 7.9 06/28/2017 1612   RBC 5.39 12/25/2018 0000   RBC 5.31 06/28/2017 1612   HGB 14.5 12/25/2018 0000   HGB 12.7 (L) 10/10/2010 0935   HCT 45.1 12/25/2018 0000   HCT 39.2 10/10/2010 0935   PLT 221.0 06/28/2017 1612   PLT 287 10/10/2010 0935   MCV 84 12/25/2018 0000   MCV 77 (L) 10/10/2010 0935   MCH 26.9 12/25/2018 0000   MCH 27.9 09/09/2016 1436   MCH 24.9 (L) 10/10/2010 0935   MCH 26.8 02/28/2010 2240   MCHC 32.2 12/25/2018 0000   MCHC 32.5 06/28/2017 1612   RDW 14.8 12/25/2018 0000   RDW 16.3 (H) 10/10/2010 0935   LYMPHSABS 1.8 12/25/2018 0000   LYMPHSABS 2.0 10/10/2010 0935   MONOABS 0.6 06/28/2017 1612   EOSABS 0.1 12/25/2018 0000   EOSABS 0.1 10/10/2010 0935   BASOSABS 0.0 12/25/2018 0000   BASOSABS 0.0 10/10/2010 0935   Iron/TIBC/Ferritin/ %Sat No results found for: IRON, TIBC, FERRITIN, IRONPCTSAT Lipid Panel     Component Value Date/Time   CHOL 122 12/25/2018 0000   TRIG 60 12/25/2018 0000   HDL 41 12/25/2018 0000   CHOLHDL 3 08/05/2018 0946   VLDL 12.4 08/05/2018 0946   LDLCALC 69 12/25/2018 0000   Hepatic Function Panel  Component Value Date/Time   PROT 6.4 12/25/2018 0000   ALBUMIN 4.0 12/25/2018 0000   AST 17 12/25/2018 0000   ALT 14 12/25/2018 0000   ALKPHOS 45 12/25/2018 0000   BILITOT 0.6 12/25/2018 0000   BILIDIR 0.1 11/26/2014 1049       Component Value Date/Time   TSH 0.920 12/25/2018 0000   TSH 1.38 06/28/2017 1612   TSH 1.04 06/29/2016 1643      OBESITY BEHAVIORAL INTERVENTION VISIT  Today's visit was # 3   Starting weight: 309 lbs Starting date: 12/25/2018 Today's weight : 305 lbs Today's date: 01/23/2019 Total lbs lost to date: 4    ASK: We discussed the diagnosis of obesity with [name] today and [name] agreed to give [name] permission to discuss obesity behavioral modification therapy today.  ASSESS: Kain has the diagnosis of obesity and his BMI today is 42.56 Raeshaun is in the action stage of change   ADVISE: Fread was educated on the multiple health risks of obesity as well as the benefit of weight loss to improve his health. He was advised of the need for long term treatment and the importance of lifestyle modifications to improve his current health and to decrease his risk of future health problems.  AGREE: Multiple dietary modification options and treatment options were discussed and  Mathias agreed to follow the recommendations documented in the above note.  ARRANGE: Davion was educated on the importance of frequent visits to treat obesity as outlined per CMS and USPSTF guidelines and agreed to schedule his next follow up appointment today.  I, Trixie Dredge, am acting as transcriptionist for Ilene Qua, MD  I have reviewed the above documentation for accuracy and completeness, and I agree with the above. - Ilene Qua, MD

## 2019-02-03 ENCOUNTER — Other Ambulatory Visit (INDEPENDENT_AMBULATORY_CARE_PROVIDER_SITE_OTHER): Payer: Self-pay | Admitting: Family Medicine

## 2019-02-03 DIAGNOSIS — E559 Vitamin D deficiency, unspecified: Secondary | ICD-10-CM

## 2019-02-06 ENCOUNTER — Encounter (INDEPENDENT_AMBULATORY_CARE_PROVIDER_SITE_OTHER): Payer: Self-pay | Admitting: Family Medicine

## 2019-02-06 ENCOUNTER — Ambulatory Visit (INDEPENDENT_AMBULATORY_CARE_PROVIDER_SITE_OTHER): Payer: 59 | Admitting: Family Medicine

## 2019-02-06 ENCOUNTER — Other Ambulatory Visit: Payer: Self-pay

## 2019-02-06 VITALS — BP 146/81 | HR 71 | Temp 98.3°F | Ht 71.0 in | Wt 302.0 lb

## 2019-02-06 DIAGNOSIS — I10 Essential (primary) hypertension: Secondary | ICD-10-CM

## 2019-02-06 DIAGNOSIS — R7303 Prediabetes: Secondary | ICD-10-CM

## 2019-02-06 DIAGNOSIS — Z6841 Body Mass Index (BMI) 40.0 and over, adult: Secondary | ICD-10-CM

## 2019-02-06 DIAGNOSIS — Z9189 Other specified personal risk factors, not elsewhere classified: Secondary | ICD-10-CM

## 2019-02-06 DIAGNOSIS — E559 Vitamin D deficiency, unspecified: Secondary | ICD-10-CM | POA: Diagnosis not present

## 2019-02-06 MED ORDER — VITAMIN D (ERGOCALCIFEROL) 1.25 MG (50000 UNIT) PO CAPS: 50000.0000 [IU] | ORAL_CAPSULE | ORAL | 0 refills | Status: DC

## 2019-02-10 NOTE — Progress Notes (Signed)
Office: [phone]  /  Fax: [phone]   HPI:   Chief Complaint: OBESITY Daniel Powers is here to discuss his progress with his obesity treatment plan. He is on the Category 4 plan and is following his eating plan approximately 80 % of the time. He states he is exercising 0 minutes 0 times per week. Daniel Powers feels he did better sticking to the meal plan. He is occasionally still skipping lunch meals. He is getting all the food in at dinner occasional hunger.  His weight is (!) 302 lb (137 kg) today and has had a weight loss of 3 pounds over a period of 2 weeks since his last visit. He has lost 7 lbs since starting treatment with [name].  Hypertension Daniel Powers is a 63 y.o. male with hypertension. Daniel Powers's blood pressure is elevated today, but he is stressed. He denies chest pain, chest pressure, or headaches. He is working on weight loss to help control his blood pressure with the goal of decreasing his risk of heart attack and stroke.   At risk for cardiovascular disease Daniel Powers is at a higher than average risk for cardiovascular disease due to obesity and hypertension. He currently denies any chest pain.  Pre-Diabetes Daniel Powers has a diagnosis of pre-diabetes based on his elevated Hgb A1c and was informed this puts him at greater risk of developing diabetes. Last Hgb A1c was of 5.7 and he is not on metformin. He is not having consistent cravings, just occasional cravings for [employer] donuts. He continues to work on diet and exercise to decrease risk of diabetes. He denies nausea or hypoglycemia.  Vitamin D Deficiency Daniel Powers has a diagnosis of vitamin D deficiency. He is currently taking prescription Vit D. He notes fatigue and denies nausea, vomiting or muscle weakness.  ASSESSMENT AND PLAN:  Prediabetes  Vitamin D deficiency - Plan: Vitamin D, Ergocalciferol, (DRISDOL) 1.25 MG (50000 UT) CAPS capsule  Essential hypertension  At risk for heart disease  Class 3 severe obesity with  serious comorbidity and body mass index (BMI) of 40.0 to 44.9 in adult, unspecified obesity type ([name])  PLAN:  Hypertension We discussed sodium restriction, working on healthy weight loss, and a regular exercise program as the means to achieve improved blood pressure control. Daniel Powers agreed with this plan and agreed to follow up as directed. We will continue to monitor his blood pressure as well as his progress with the above lifestyle modifications. Daniel Powers agrees to continue his current medications and will watch for signs of hypotension as he continues his lifestyle modifications. We will follow up on his blood pressure at next appointment. Daniel Powers agrees to follow up with our clinic in 2 weeks.  Cardiovascular risk counseling Daniel Powers was given extended (15 minutes) coronary artery disease prevention counseling today. He is 63 y.o. male and has risk factors for heart disease including obesity and hypertension. We discussed intensive lifestyle modifications today with an emphasis on specific weight loss instructions and strategies. Pt was also informed of the importance of increasing exercise and decreasing saturated fats to help prevent heart disease.  Pre-Diabetes Daniel Powers will continue to work on weight loss, exercise, and decreasing simple carbohydrates in his diet to help decrease the risk of diabetes. We dicussed metformin including benefits and risks. He was informed that eating too many simple carbohydrates or too many calories at one sitting increases the likelihood of GI side effects. Daniel Powers declined metformin for now and a prescription was not written today. We will repeat labs in 2  months. Daniel Powers agrees to follow up with our clinic in 2 weeks as directed to monitor his progress.  Vitamin D Deficiency Daniel Powers was informed that low vitamin D levels contributes to fatigue and are associated with obesity, breast, and colon cancer. Daniel Powers agrees to continue taking prescription Vit D 50,000 IU every  week #4 and we will refill for 1 month. He will follow up for routine testing of vitamin D, at least 2-3 times per year. He was informed of the risk of over-replacement of vitamin D and agrees to not increase his dose unless he discusses this with [name] first. Daniel Powers agrees to follow up with our clinic in 2 weeks.  Obesity Daniel Powers is currently in the action stage of change. As such, his goal is to continue with weight loss efforts He has agreed to follow the Category 4 plan Daniel Powers has been instructed to work up to a goal of 150 minutes of combined cardio and strengthening exercise per week for weight loss and overall health benefits. We discussed the following Behavioral Modification Strategies today: increasing lean protein intake, increasing vegetables and work on meal planning and easy cooking plans, no skipping meals, keeping healthy foods in the home, and planning for success   Daniel Powers has agreed to follow up with our clinic in 2 weeks. He was informed of the importance of frequent follow up visits to maximize his success with intensive lifestyle modifications for his multiple health conditions.  ALLERGIES: Allergies  Allergen Reactions  . Penicillins     REACTION: Penicillin    MEDICATIONS: Current Outpatient Medications on File Prior to Visit  Medication Sig Dispense Refill  . albuterol (PROAIR HFA) 108 (90 Base) MCG/ACT inhaler Inhale 2 puffs into the lungs every 6 (six) hours as needed for wheezing. 1 Inhaler 2  . ALPRAZolam (XANAX) 0.5 MG tablet Take 0.5 mg by mouth at bedtime as needed.    [name] amLODipine (NORVASC) 5 MG tablet TAKE 1 TABLET(5 MG) BY MOUTH DAILY 90 tablet 1  . atorvastatin (LIPITOR) 10 MG tablet TAKE 1 TABLET(10 MG) BY MOUTH DAILY AT 6 PM 90 tablet 1  . cetirizine (ZYRTEC) 10 MG tablet Take 10 mg by mouth daily.    [name] CINNAMON PO Take 1 tablet by mouth daily.    . fish oil-omega-3 fatty acids 1000 MG capsule Take 1 g by mouth daily.    . Flaxseed, Linseed, 1000 MG CAPS  Take 1 capsule by mouth daily.    . fluticasone (FLONASE) 50 MCG/ACT nasal spray Place 2 sprays into both nostrils daily. 16 g 6  . olmesartan (BENICAR) 20 MG tablet TAKE 1 TABLET(20 MG) BY MOUTH DAILY 90 tablet 1  . tadalafil (CIALIS) 20 MG tablet Take 20 mg by mouth daily as needed.    . warfarin (COUMADIN) 5 MG tablet TAKE AS DIRECTED BY COUMADIN CLINIC. 90 tablet 0   Current Facility-Administered Medications on File Prior to Visit  Medication Dose Route Frequency Provider Last Rate Last Dose  . 0.9 %  sodium chloride infusion  500 mL Intravenous Continuous Armbruster, [name], MD        PAST MEDICAL HISTORY: Past Medical History:  Diagnosis Date  . Anxiety   . Arthritis   . Asthma   . COPD (chronic obstructive pulmonary disease) ([place])   . DVT (deep venous thrombosis) ([place])   . Gout   . High cholesterol   . Hypertension   . Influenza with pneumonia   . Lactose intolerance   . Obesity   .  Pre-diabetes   . Psychosexual dysfunction with inhibited sexual excitement     PAST SURGICAL HISTORY: Past Surgical History:  Procedure Laterality Date  . HAND SURGERY     right  . TONSILLECTOMY AND ADENOIDECTOMY      SOCIAL HISTORY: Social History   Tobacco Use  . Smoking status: Never Smoker  . Smokeless tobacco: Never Used  Substance Use Topics  . Alcohol use: Yes    Comment: ocass- once or twice a month  . Drug use: No    FAMILY HISTORY: Family History  Problem Relation Age of Onset  . Hypertension Mother   . Alzheimer's disease Mother   . Hypertension Father   . Alcohol abuse Father   . Obesity Father   . Cancer Sister        "male organs"  . Lung cancer Maternal Uncle   . Esophageal cancer Maternal Uncle   . Hypertension Maternal Grandmother   . Alzheimer's disease Maternal Grandmother   . Hypertension Maternal Grandfather   . Hypertension Paternal Grandmother   . Hypertension Paternal Grandfather   . Heart disease Brother 55       MI  . Alzheimer's  disease Paternal Aunt   . Colon cancer Neg Hx     ROS: Review of Systems  Constitutional: Positive for malaise/fatigue and weight loss.  Cardiovascular: Negative for chest pain.       Negative chest pressure  Gastrointestinal: Negative for nausea and vomiting.  Musculoskeletal:       Negative muscle weakness  Neurological: Negative for headaches.  Endo/Heme/Allergies:       Negative hypoglycemia    PHYSICAL EXAM: Blood pressure (!) 146/81, pulse 71, temperature 98.3 F (36.8 C), temperature source Oral, height 5\' 11"  (1.803 m), weight (!) 302 lb (137 kg), SpO2 98 %. Body mass index is 42.12 kg/m. Physical Exam Vitals signs reviewed.  Constitutional:      Appearance: Normal appearance. He is obese.  Cardiovascular:     Rate and Rhythm: Normal rate.     Pulses: Normal pulses.  Pulmonary:     Effort: Pulmonary effort is normal.     Breath sounds: Normal breath sounds.  Musculoskeletal: Normal range of motion.  Skin:    General: Skin is warm and dry.  Neurological:     Mental Status: He is alert and oriented to person, place, and time.  Psychiatric:        Mood and Affect: Mood normal.        Behavior: Behavior normal.     RECENT LABS AND TESTS: BMET    Component Value Date/Time   NA 139 12/25/2018 0000   K 4.3 12/25/2018 0000   CL 104 12/25/2018 0000   CO2 20 12/25/2018 0000   GLUCOSE 98 12/25/2018 0000   GLUCOSE 96 08/05/2018 0946   GLUCOSE 85 05/07/2006 1230   BUN 19 12/25/2018 0000   CREATININE 1.06 12/25/2018 0000   CREATININE 0.85 08/01/2013 1250   CALCIUM 9.1 12/25/2018 0000   GFRNONAA 74 12/25/2018 0000   GFRAA 85 12/25/2018 0000   Lab Results  Component Value Date   HGBA1C 5.7 (H) 12/25/2018   HGBA1C 6.1 08/05/2018   HGBA1C 6.2 02/01/2018   Lab Results  Component Value Date   INSULIN 9.5 12/25/2018   CBC    Component Value Date/Time   WBC 6.5 12/25/2018 0000   WBC 7.9 06/28/2017 1612   RBC 5.39 12/25/2018 0000   RBC 5.31 06/28/2017  1612   HGB 14.5 12/25/2018 0000  HGB 12.7 (L) 10/10/2010 0935   HCT 45.1 12/25/2018 0000   HCT 39.2 10/10/2010 0935   PLT 221.0 06/28/2017 1612   PLT 287 10/10/2010 0935   MCV 84 12/25/2018 0000   MCV 77 (L) 10/10/2010 0935   MCH 26.9 12/25/2018 0000   MCH 27.9 09/09/2016 1436   MCH 24.9 (L) 10/10/2010 0935   MCH 26.8 02/28/2010 2240   MCHC 32.2 12/25/2018 0000   MCHC 32.5 06/28/2017 1612   RDW 14.8 12/25/2018 0000   RDW 16.3 (H) 10/10/2010 0935   LYMPHSABS 1.8 12/25/2018 0000   LYMPHSABS 2.0 10/10/2010 0935   MONOABS 0.6 06/28/2017 1612   EOSABS 0.1 12/25/2018 0000   EOSABS 0.1 10/10/2010 0935   BASOSABS 0.0 12/25/2018 0000   BASOSABS 0.0 10/10/2010 0935   Iron/TIBC/Ferritin/ %Sat No results found for: IRON, TIBC, FERRITIN, IRONPCTSAT Lipid Panel     Component Value Date/Time   CHOL 122 12/25/2018 0000   TRIG 60 12/25/2018 0000   HDL 41 12/25/2018 0000   CHOLHDL 3 08/05/2018 0946   VLDL 12.4 08/05/2018 0946   LDLCALC 69 12/25/2018 0000   Hepatic Function Panel     Component Value Date/Time   PROT 6.4 12/25/2018 0000   ALBUMIN 4.0 12/25/2018 0000   AST 17 12/25/2018 0000   ALT 14 12/25/2018 0000   ALKPHOS 45 12/25/2018 0000   BILITOT 0.6 12/25/2018 0000   BILIDIR 0.1 11/26/2014 1049      Component Value Date/Time   TSH 0.920 12/25/2018 0000   TSH 1.38 06/28/2017 1612   TSH 1.04 06/29/2016 1643      OBESITY BEHAVIORAL INTERVENTION VISIT  Today's visit was # 4   Starting weight: 309 lbs Starting date: 12/25/2018 Today's weight : 302 lbs  Today's date: 02/06/2019 Total lbs lost to date: 7    ASK: We discussed the diagnosis of obesity with Daniel Powers today and Daniel Powers agreed to give [name] permission to discuss obesity behavioral modification therapy today.  ASSESS: Daniel Powers has the diagnosis of obesity and his BMI today is 42.14 Daniel Powers is in the action stage of change   ADVISE: Daniel Powers was educated on the multiple health risks of obesity as well as  the benefit of weight loss to improve his health. He was advised of the need for long term treatment and the importance of lifestyle modifications to improve his current health and to decrease his risk of future health problems.  AGREE: Multiple dietary modification options and treatment options were discussed and  Daniel Powers agreed to follow the recommendations documented in the above note.  ARRANGE: Daniel Powers was educated on the importance of frequent visits to treat obesity as outlined per CMS and USPSTF guidelines and agreed to schedule his next follow up appointment today.  I, Trixie Dredge, am acting as transcriptionist for Ilene Qua, MD  I have reviewed the above documentation for accuracy and completeness, and I agree with the above. - Ilene Qua, MD

## 2019-02-11 ENCOUNTER — Ambulatory Visit (INDEPENDENT_AMBULATORY_CARE_PROVIDER_SITE_OTHER): Payer: 59

## 2019-02-11 ENCOUNTER — Other Ambulatory Visit: Payer: Self-pay

## 2019-02-11 DIAGNOSIS — Z7901 Long term (current) use of anticoagulants: Secondary | ICD-10-CM | POA: Diagnosis not present

## 2019-02-11 LAB — POCT INR: INR: 2.5 (ref 2.0–3.0)

## 2019-02-11 NOTE — Progress Notes (Signed)
Pt here for INR check per Dr. Etter Sjogren   Goal INR =2.0-3.0  Last INR =2.0  Pt currently takes Coumadin 5 mg daily.  Pt denies recent antibiotics, no dietary changes and no unusual bruising / bleeding.  INR today = 2.5  Pt advised per Dr. Etter Sjogren continue current regimen and follow up in 30 days.

## 2019-02-20 ENCOUNTER — Other Ambulatory Visit: Payer: Self-pay | Admitting: *Deleted

## 2019-02-20 DIAGNOSIS — I82401 Acute embolism and thrombosis of unspecified deep veins of right lower extremity: Secondary | ICD-10-CM

## 2019-02-20 MED ORDER — WARFARIN SODIUM 5 MG PO TABS: ORAL_TABLET | ORAL | 1 refills | Status: DC

## 2019-02-24 ENCOUNTER — Ambulatory Visit (INDEPENDENT_AMBULATORY_CARE_PROVIDER_SITE_OTHER): Payer: 59 | Admitting: Family Medicine

## 2019-02-24 ENCOUNTER — Other Ambulatory Visit: Payer: Self-pay

## 2019-02-24 ENCOUNTER — Encounter (INDEPENDENT_AMBULATORY_CARE_PROVIDER_SITE_OTHER): Payer: Self-pay | Admitting: Family Medicine

## 2019-02-24 VITALS — BP 136/78 | HR 69 | Temp 98.2°F | Ht 71.0 in | Wt 301.0 lb

## 2019-02-24 DIAGNOSIS — R7303 Prediabetes: Secondary | ICD-10-CM

## 2019-02-24 DIAGNOSIS — E559 Vitamin D deficiency, unspecified: Secondary | ICD-10-CM

## 2019-02-24 DIAGNOSIS — Z6841 Body Mass Index (BMI) 40.0 and over, adult: Secondary | ICD-10-CM | POA: Diagnosis not present

## 2019-02-25 NOTE — Progress Notes (Signed)
Office: [phone]  /  Fax: [phone]   HPI:   Chief Complaint: OBESITY Daniel Powers is here to discuss his progress with his obesity treatment plan. He is on the Category 4 plan and is following his eating plan approximately 50% of the time. He states he is exercising 0 minutes 0 times per week. Daniel Powers is only able to stay on the plan approximately 50% of the time. He reports 1 day of increased hunger. He is starting to get bored with the meal plan and is looking for alternative lunch options. His weight is (!) 301 lb (136.5 kg) today and has had a weight loss of 1 pound over a period of 3 weeks since his last visit. He has lost 8 lbs since starting treatment with [name].  Pre-Diabetes Daniel Powers has a diagnosis of prediabetes based on his elevated Hgb A1c and was informed this puts him at greater risk of developing diabetes. Last A1c was 5.7 on 12/25/2018. He is not taking metformin currently and continues to work on diet and exercise to decrease risk of diabetes.  Vitamin D deficiency Daniel Powers has a diagnosis of Vitamin D deficiency. He is currently taking prescription Vit D and denies nausea, vomiting or muscle weakness, but does report fatigue.  ASSESSMENT AND PLAN:  Prediabetes  Vitamin D deficiency  Class 3 severe obesity with serious comorbidity and body mass index (BMI) of 40.0 to 44.9 in adult, unspecified obesity type Daniel Powers)  PLAN:  Pre-Diabetes Daniel Powers will continue to work on weight loss, exercise, and decreasing simple carbohydrates in his diet to help decrease the risk of diabetes. We dicussed metformin including benefits and risks. He was informed that eating too many simple carbohydrates or too many calories at one sitting increases the likelihood of GI side effects. Daniel Powers will have repeat labs the end of September. He will follow-up with [name] as directed to monitor his progress.  Vitamin D Deficiency Daniel Powers was informed that low Vitamin D levels contributes to fatigue and are  associated with obesity, breast, and colon cancer. He agrees to continue  prescription Vit D (no refill needed) and will follow-up for routine testing of Vitamin D, at least 2-3 times per year. He was informed of the risk of over-replacement of Vitamin D and agrees to not increase his dose unless he discusses this with [name] first. Daniel Powers agrees to follow-up with our clinic in 2 weeks.  I spent > than 50% of the 15 minute visit on counseling as documented in the note.  Obesity Daniel Powers is currently in the action stage of change. As such, his goal is to continue with weight loss efforts. He has agreed to follow the Category 4 plan. Daniel Powers has been instructed to work up to a goal of 150 minutes of combined cardio and strengthening exercise per week for weight loss and overall health benefits. We discussed the following Behavioral Modification Strategies today: increasing lean protein intake, increasing vegetables, work on meal planning and easy cooking plans, keeping healthy foods in the home, and planning for success.   Daniel Powers has agreed to follow-up with our clinic in 2 weeks. He was informed of the importance of frequent follow-up visits to maximize his success with intensive lifestyle modifications for his multiple health conditions.  ALLERGIES: Allergies  Allergen Reactions  . Penicillins     REACTION: Penicillin    MEDICATIONS: Current Outpatient Medications on File Prior to Visit  Medication Sig Dispense Refill  . albuterol (PROAIR HFA) 108 (90 Base) MCG/ACT inhaler Inhale 2 puffs  into the lungs every 6 (six) hours as needed for wheezing. 1 Inhaler 2  . ALPRAZolam (XANAX) 0.5 MG tablet Take 0.5 mg by mouth at bedtime as needed.    [name] amLODipine (NORVASC) 5 MG tablet TAKE 1 TABLET(5 MG) BY MOUTH DAILY 90 tablet 1  . atorvastatin (LIPITOR) 10 MG tablet TAKE 1 TABLET(10 MG) BY MOUTH DAILY AT 6 PM 90 tablet 1  . cetirizine (ZYRTEC) 10 MG tablet Take 10 mg by mouth daily.    [name] CINNAMON PO Take  1 tablet by mouth daily.    . fish oil-omega-3 fatty acids 1000 MG capsule Take 1 g by mouth daily.    . Flaxseed, Linseed, 1000 MG CAPS Take 1 capsule by mouth daily.    . fluticasone (FLONASE) 50 MCG/ACT nasal spray Place 2 sprays into both nostrils daily. 16 g 6  . olmesartan (BENICAR) 20 MG tablet TAKE 1 TABLET(20 MG) BY MOUTH DAILY 90 tablet 1  . tadalafil (CIALIS) 20 MG tablet Take 20 mg by mouth daily as needed.    . Vitamin D, Ergocalciferol, (DRISDOL) 1.25 MG (50000 UT) CAPS capsule Take 1 capsule (50,000 Units total) by mouth every 7 (seven) days. 4 capsule 0  . warfarin (COUMADIN) 5 MG tablet TAKE AS DIRECTED BY COUMADIN CLINIC. 90 tablet 1   Current Facility-Administered Medications on File Prior to Visit  Medication Dose Route Frequency Provider Last Rate Last Dose  . 0.9 %  sodium chloride infusion  500 mL Intravenous Continuous Armbruster, [name], MD        PAST MEDICAL HISTORY: Past Medical History:  Diagnosis Date  . Anxiety   . Arthritis   . Asthma   . COPD (chronic obstructive pulmonary disease) ([place])   . DVT (deep venous thrombosis) ([place])   . Gout   . High cholesterol   . Hypertension   . Influenza with pneumonia   . Lactose intolerance   . Obesity   . Pre-diabetes   . Psychosexual dysfunction with inhibited sexual excitement     PAST SURGICAL HISTORY: Past Surgical History:  Procedure Laterality Date  . HAND SURGERY     right  . TONSILLECTOMY AND ADENOIDECTOMY      SOCIAL HISTORY: Social History   Tobacco Use  . Smoking status: Never Smoker  . Smokeless tobacco: Never Used  Substance Use Topics  . Alcohol use: Yes    Comment: ocass- once or twice a month  . Drug use: No    FAMILY HISTORY: Family History  Problem Relation Age of Onset  . Hypertension Mother   . Alzheimer's disease Mother   . Hypertension Father   . Alcohol abuse Father   . Obesity Father   . Cancer Sister        "male organs"  . Lung cancer Maternal Uncle   .  Esophageal cancer Maternal Uncle   . Hypertension Maternal Grandmother   . Alzheimer's disease Maternal Grandmother   . Hypertension Maternal Grandfather   . Hypertension Paternal Grandmother   . Hypertension Paternal Grandfather   . Heart disease Brother 44       MI  . Alzheimer's disease Paternal Aunt   . Colon cancer Neg Hx    ROS: Review of Systems  Constitutional: Positive for malaise/fatigue.  Gastrointestinal: Negative for nausea and vomiting.  Musculoskeletal:       Negative for muscle weakness.   PHYSICAL EXAM: Blood pressure 136/78, pulse 69, temperature 98.2 F (36.8 C), temperature source Oral, height 5\' 11"  (1.803  m), weight (!) 301 lb (136.5 kg), SpO2 99 %. Body mass index is 41.98 kg/m. Physical Exam Vitals signs reviewed.  Constitutional:      Appearance: Normal appearance. He is obese.  Cardiovascular:     Rate and Rhythm: Normal rate.     Pulses: Normal pulses.  Pulmonary:     Effort: Pulmonary effort is normal.     Breath sounds: Normal breath sounds.  Musculoskeletal: Normal range of motion.  Skin:    General: Skin is warm and dry.  Neurological:     Mental Status: He is alert and oriented to person, place, and time.  Psychiatric:        Behavior: Behavior normal.   RECENT LABS AND TESTS: BMET    Component Value Date/Time   NA 139 12/25/2018 0000   K 4.3 12/25/2018 0000   CL 104 12/25/2018 0000   CO2 20 12/25/2018 0000   GLUCOSE 98 12/25/2018 0000   GLUCOSE 96 08/05/2018 0946   GLUCOSE 85 05/07/2006 1230   BUN 19 12/25/2018 0000   CREATININE 1.06 12/25/2018 0000   CREATININE 0.85 08/01/2013 1250   CALCIUM 9.1 12/25/2018 0000   GFRNONAA 74 12/25/2018 0000   GFRAA 85 12/25/2018 0000   Lab Results  Component Value Date   HGBA1C 5.7 (H) 12/25/2018   HGBA1C 6.1 08/05/2018   HGBA1C 6.2 02/01/2018   Lab Results  Component Value Date   INSULIN 9.5 12/25/2018   CBC    Component Value Date/Time   WBC 6.5 12/25/2018 0000   WBC 7.9  06/28/2017 1612   RBC 5.39 12/25/2018 0000   RBC 5.31 06/28/2017 1612   HGB 14.5 12/25/2018 0000   HGB 12.7 (L) 10/10/2010 0935   HCT 45.1 12/25/2018 0000   HCT 39.2 10/10/2010 0935   PLT 221.0 06/28/2017 1612   PLT 287 10/10/2010 0935   MCV 84 12/25/2018 0000   MCV 77 (L) 10/10/2010 0935   MCH 26.9 12/25/2018 0000   MCH 27.9 09/09/2016 1436   MCH 24.9 (L) 10/10/2010 0935   MCH 26.8 02/28/2010 2240   MCHC 32.2 12/25/2018 0000   MCHC 32.5 06/28/2017 1612   RDW 14.8 12/25/2018 0000   RDW 16.3 (H) 10/10/2010 0935   LYMPHSABS 1.8 12/25/2018 0000   LYMPHSABS 2.0 10/10/2010 0935   MONOABS 0.6 06/28/2017 1612   EOSABS 0.1 12/25/2018 0000   EOSABS 0.1 10/10/2010 0935   BASOSABS 0.0 12/25/2018 0000   BASOSABS 0.0 10/10/2010 0935   Iron/TIBC/Ferritin/ %Sat No results found for: IRON, TIBC, FERRITIN, IRONPCTSAT Lipid Panel     Component Value Date/Time   CHOL 122 12/25/2018 0000   TRIG 60 12/25/2018 0000   HDL 41 12/25/2018 0000   CHOLHDL 3 08/05/2018 0946   VLDL 12.4 08/05/2018 0946   LDLCALC 69 12/25/2018 0000   Hepatic Function Panel     Component Value Date/Time   PROT 6.4 12/25/2018 0000   ALBUMIN 4.0 12/25/2018 0000   AST 17 12/25/2018 0000   ALT 14 12/25/2018 0000   ALKPHOS 45 12/25/2018 0000   BILITOT 0.6 12/25/2018 0000   BILIDIR 0.1 11/26/2014 1049      Component Value Date/Time   TSH 0.920 12/25/2018 0000   TSH 1.38 06/28/2017 1612   TSH 1.04 06/29/2016 1643   Results for SHULEM, MADER (MRN [id number]) as of 02/25/2019 15:10  Ref. Range 12/25/2018 00:00  Vitamin D, 25-Hydroxy Latest Ref Range: 30.0 - 100.0 ng/mL <4.0 (L)   OBESITY BEHAVIORAL INTERVENTION VISIT  Today's visit  was #5   Starting weight: 309 lbs Starting date: 12/25/2018 Today's weight: 301 lbs  Today's date: 02/24/2019 Total lbs lost to date: 8    02/24/2019  Height 5\' 11"  (1.803 m)  Weight 301 lb (136.5 kg) (A)  BMI (Calculated) 42  BLOOD PRESSURE - SYSTOLIC 161  BLOOD PRESSURE -  DIASTOLIC 78   Body Fat % 09.6 %  Total Body Water (lbs) 137.2 lbs   ASK: We discussed the diagnosis of obesity with Daniel Powers today and Daniel Powers agreed to give [name] permission to discuss obesity behavioral modification therapy today.  ASSESS: Daniel Powers has the diagnosis of obesity and his BMI today is 42.0. Daniel Powers is in the action stage of change.   ADVISE: Daniel Powers was educated on the multiple health risks of obesity as well as the benefit of weight loss to improve his health. He was advised of the need for long term treatment and the importance of lifestyle modifications to improve his current health and to decrease his risk of future health problems.  AGREE: Multiple dietary modification options and treatment options were discussed and  Daniel Powers agreed to follow the recommendations documented in the above note.  ARRANGE: Daniel Powers was educated on the importance of frequent visits to treat obesity as outlined per CMS and USPSTF guidelines and agreed to schedule his next follow up appointment today.  I, Michaelene Song, am acting as transcriptionist for Ilene Qua, MD  I have reviewed the above documentation for accuracy and completeness, and I agree with the above. - Ilene Qua, MD

## 2019-03-04 ENCOUNTER — Other Ambulatory Visit: Payer: Self-pay | Admitting: Family Medicine

## 2019-03-04 ENCOUNTER — Other Ambulatory Visit (INDEPENDENT_AMBULATORY_CARE_PROVIDER_SITE_OTHER): Payer: Self-pay | Admitting: Family Medicine

## 2019-03-04 DIAGNOSIS — E785 Hyperlipidemia, unspecified: Secondary | ICD-10-CM

## 2019-03-04 DIAGNOSIS — E559 Vitamin D deficiency, unspecified: Secondary | ICD-10-CM

## 2019-03-04 MED ORDER — ATORVASTATIN CALCIUM 10 MG PO TABS: ORAL_TABLET | ORAL | 0 refills | Status: DC

## 2019-03-04 NOTE — Telephone Encounter (Signed)
Requested Prescriptions  Pending Prescriptions Disp Refills  . atorvastatin (LIPITOR) 10 MG tablet 90 tablet 0    Sig: TAKE 1 TABLET(10 MG) BY MOUTH DAILY AT 6 PM     Cardiovascular:  Antilipid - Statins Passed - 03/04/2019  2:51 PM      Passed - Total Cholesterol in normal range and within 360 days    Cholesterol, Total  Date Value Ref Range Status  12/25/2018 122 100 - 199 mg/dL Final         Passed - LDL in normal range and within 360 days    LDL Calculated  Date Value Ref Range Status  12/25/2018 69 0 - 99 mg/dL Final         Passed - HDL in normal range and within 360 days    HDL  Date Value Ref Range Status  12/25/2018 41 >39 mg/dL Final         Passed - Triglycerides in normal range and within 360 days    Triglycerides  Date Value Ref Range Status  12/25/2018 60 0 - 149 mg/dL Final         Passed - Patient is not pregnant      Passed - Valid encounter within last 12 months    Recent Outpatient Visits          5 months ago Long term (current) use of anticoagulants   [facility] at [facility], [place], DO   7 months ago Diet-controlled diabetes mellitus ([place])   [facility]ivist at [place], DO   8 months ago Long term (current) use of anticoagulants   [facility]ivist at [place], LPN   1 year ago Chest pain, unspecified type   Archivist at [place], DO   1 year ago DVT, HX OF   [facility]ivist at [facility], [place], [place]

## 2019-03-04 NOTE — Telephone Encounter (Signed)
Medication Refill - Medication:atorvastatin (LIPITOR) 10 MG tablet [[id number]]     Has the patient contacted their pharmacy? No. (Agent: If no, request that the patient contact the pharmacy for the refill.) (Agent: If yes, when and what did the pharmacy advise?)  Preferred Pharmacy (with phone number or street name):  [facility] [place], [address] [phone] (Phone)     Agent: Please be advised that RX refills may take up to 3 business days. We ask that you follow-up with your pharmacy.

## 2019-03-10 ENCOUNTER — Ambulatory Visit (INDEPENDENT_AMBULATORY_CARE_PROVIDER_SITE_OTHER): Payer: 59 | Admitting: Family Medicine

## 2019-03-10 ENCOUNTER — Encounter (INDEPENDENT_AMBULATORY_CARE_PROVIDER_SITE_OTHER): Payer: Self-pay | Admitting: Family Medicine

## 2019-03-10 ENCOUNTER — Other Ambulatory Visit: Payer: Self-pay

## 2019-03-10 VITALS — BP 134/79 | HR 62 | Temp 98.2°F | Ht 71.0 in | Wt 298.0 lb

## 2019-03-10 DIAGNOSIS — E559 Vitamin D deficiency, unspecified: Secondary | ICD-10-CM

## 2019-03-10 DIAGNOSIS — I10 Essential (primary) hypertension: Secondary | ICD-10-CM

## 2019-03-10 DIAGNOSIS — Z9189 Other specified personal risk factors, not elsewhere classified: Secondary | ICD-10-CM

## 2019-03-10 DIAGNOSIS — Z6841 Body Mass Index (BMI) 40.0 and over, adult: Secondary | ICD-10-CM

## 2019-03-10 MED ORDER — VITAMIN D (ERGOCALCIFEROL) 1.25 MG (50000 UNIT) PO CAPS: 50000.0000 [IU] | ORAL_CAPSULE | ORAL | 0 refills | Status: DC

## 2019-03-11 NOTE — Progress Notes (Signed)
Office: [phone]  /  Fax: [phone]   HPI:   Chief Complaint: OBESITY Daniel Powers is here to discuss his progress with his obesity treatment plan. He is on the Category 4 plan and is following his eating plan approximately 75% of the time. He states he is exercising 0 minutes 0 times per week. [name] states for the past few weeks he has been slightly more on track with no hunger but does report cravings for candy and has had 1 Snicker bar in the past few weeks. He is getting in approximately 5-6 ounces of meat at night and is normally doing an apple and [place] salad for lunch with chicken on the [place] salad. He states he is eating an Egg McMuffin for breakfast from [employer]. His weight is 298 lb (135.2 kg) today and has had a weight loss of 3 pounds over a period of 2 weeks since his last visit. He has lost 11 lbs since starting treatment with Daniel Powers.  Vitamin D deficiency Daniel Powers has a diagnosis of Vitamin D deficiency. He is currently taking prescription Vit D and denies nausea, vomiting or muscle weakness but does admit to fatigue.  At risk for osteopenia and osteoporosis [name] is at higher risk of osteopenia and osteoporosis due to Vitamin D deficiency.   Essential Hypertension [name] is a 63 y.o. male with hypertension.  [name] denies chest pain, chest pressure, or headache. He is working weight loss to help control his blood pressure with the goal of decreasing his risk of heart attack and stroke. [name]'s blood pressure is currently controlled.  ASSESSMENT AND PLAN:  Essential hypertension  Vitamin D deficiency - Plan: Vitamin D, Ergocalciferol, (DRISDOL) 1.25 MG (50000 UT) CAPS capsule  At risk for osteoporosis  Class 3 severe obesity with serious comorbidity and body mass index (BMI) of 40.0 to 44.9 in adult, unspecified obesity type ([place])  PLAN:  Vitamin D Deficiency [name] was informed that low Vitamin D levels contributes to fatigue and are associated with  obesity, breast, and colon cancer. He agrees to continue to take prescription Vit D @ 50,000 IU every week #4 with 0 refills and will follow-up for routine testing of Vitamin D, at least 2-3 times per year. He was informed of the risk of over-replacement of Vitamin D and agrees to not increase his dose unless he discusses this with Daniel Powers first. Daniel Powers agrees to follow-up with our clinic in 2 weeks.  At risk for osteopenia and osteoporosis [name] was given extended  (15 minutes) osteoporosis prevention counseling today. Daniel Powers is at risk for osteopenia and osteoporosis due to his Vitamin D deficiency. He was encouraged to take his Vitamin D and follow his higher calcium diet and increase strengthening exercise to help strengthen his bones and decrease his risk of osteopenia and osteoporosis.  Essential Hypertension We discussed sodium restriction, working on healthy weight loss, and a regular exercise program as the means to achieve improved blood pressure control. Daniel Powers agreed with this plan and agreed to follow up as directed. We will continue to monitor his blood pressure as well as his progress with the above lifestyle modifications. He will continue his medications as prescribed and will watch for signs of hypotension as he continues his lifestyle modifications. Will check his INR in 3 days.  Obesity Daniel Powers is currently in the action stage of change. As such, his goal is to continue with weight loss efforts. He has agreed to follow the Category 4 plan. We advised Daniel Powers  to do 2 egg white McMuffins for breakfast. Daniel Powers has been instructed to start walking 15-30 minutes 3 times per week for weight loss and overall health benefits. We discussed the following Behavioral Modification Strategies today: increasing lean protein intake, increasing vegetables, work on meal planning and easy cooking plans, keeping healthy foods in the home, and planning for success.  Daniel Powers has agreed to follow-up with our  clinic in 2 weeks. He was informed of the importance of frequent follow-up visits to maximize his success with intensive lifestyle modifications for his multiple health conditions.  ALLERGIES: Allergies  Allergen Reactions  . Penicillins     REACTION: Penicillin    MEDICATIONS: Current Outpatient Medications on File Prior to Visit  Medication Sig Dispense Refill  . albuterol (PROAIR HFA) 108 (90 Base) MCG/ACT inhaler Inhale 2 puffs into the lungs every 6 (six) hours as needed for wheezing. 1 Inhaler 2  . ALPRAZolam (XANAX) 0.5 MG tablet Take 0.5 mg by mouth at bedtime as needed.    Daniel Powers amLODipine (NORVASC) 5 MG tablet TAKE 1 TABLET(5 MG) BY MOUTH DAILY 90 tablet 1  . atorvastatin (LIPITOR) 10 MG tablet TAKE 1 TABLET(10 MG) BY MOUTH DAILY AT 6 PM 90 tablet 0  . cetirizine (ZYRTEC) 10 MG tablet Take 10 mg by mouth daily.    Daniel Powers CINNAMON PO Take 1 tablet by mouth daily.    . fish oil-omega-3 fatty acids 1000 MG capsule Take 1 g by mouth daily.    . Flaxseed, Linseed, 1000 MG CAPS Take 1 capsule by mouth daily.    . fluticasone (FLONASE) 50 MCG/ACT nasal spray Place 2 sprays into both nostrils daily. 16 g 6  . olmesartan (BENICAR) 20 MG tablet TAKE 1 TABLET(20 MG) BY MOUTH DAILY 90 tablet 1  . tadalafil (CIALIS) 20 MG tablet Take 20 mg by mouth daily as needed.    . warfarin (COUMADIN) 5 MG tablet TAKE AS DIRECTED BY COUMADIN CLINIC. 90 tablet 1   Current Facility-Administered Medications on File Prior to Visit  Medication Dose Route Frequency Provider Last Rate Last Dose  . 0.9 %  sodium chloride infusion  500 mL Intravenous Continuous Armbruster, Daniel Raspberry, MD        PAST MEDICAL HISTORY: Past Medical History:  Diagnosis Date  . Anxiety   . Arthritis   . Asthma   . COPD (chronic obstructive pulmonary disease) ([place])   . DVT (deep venous thrombosis) ([place])   . Gout   . High cholesterol   . Hypertension   . Influenza with pneumonia   . Lactose intolerance   . Obesity   . Pre-diabetes    . Psychosexual dysfunction with inhibited sexual excitement     PAST SURGICAL HISTORY: Past Surgical History:  Procedure Laterality Date  . HAND SURGERY     right  . TONSILLECTOMY AND ADENOIDECTOMY      SOCIAL HISTORY: Social History   Tobacco Use  . Smoking status: Never Smoker  . Smokeless tobacco: Never Used  Substance Use Topics  . Alcohol use: Yes    Comment: ocass- once or twice a month  . Drug use: No    FAMILY HISTORY: Family History  Problem Relation Age of Onset  . Hypertension Mother   . Alzheimer's disease Mother   . Hypertension Father   . Alcohol abuse Father   . Obesity Father   . Cancer Sister        "male organs"  . Lung cancer Maternal Uncle   . Esophageal  cancer Maternal Uncle   . Hypertension Maternal Grandmother   . Alzheimer's disease Maternal Grandmother   . Hypertension Maternal Grandfather   . Hypertension Paternal Grandmother   . Hypertension Paternal Grandfather   . Heart disease Brother 28       MI  . Alzheimer's disease Paternal Aunt   . Colon cancer Neg Hx    ROS: Review of Systems  Constitutional: Positive for malaise/fatigue.  Cardiovascular: Negative for chest pain.       Negative for chest pressure.  Gastrointestinal: Negative for nausea and vomiting.  Musculoskeletal:       Negative for muscle weakness.  Neurological: Negative for headaches.   PHYSICAL EXAM: Blood pressure 134/79, pulse 62, temperature 98.2 F (36.8 C), temperature source Oral, height 5\' 11"  (1.803 m), weight 298 lb (135.2 kg), SpO2 100 %. Body mass index is 41.56 kg/m. Physical Exam Vitals signs reviewed.  Constitutional:      Appearance: Normal appearance. He is obese.  Cardiovascular:     Rate and Rhythm: Normal rate.     Pulses: Normal pulses.  Pulmonary:     Effort: Pulmonary effort is normal.     Breath sounds: Normal breath sounds.  Musculoskeletal: Normal range of motion.  Skin:    General: Skin is warm and dry.  Neurological:      Mental Status: He is alert and oriented to person, place, and time.  Psychiatric:        Behavior: Behavior normal.   RECENT LABS AND TESTS: BMET    Component Value Date/Time   NA 139 12/25/2018 0000   K 4.3 12/25/2018 0000   CL 104 12/25/2018 0000   CO2 20 12/25/2018 0000   GLUCOSE 98 12/25/2018 0000   GLUCOSE 96 08/05/2018 0946   GLUCOSE 85 05/07/2006 1230   BUN 19 12/25/2018 0000   CREATININE 1.06 12/25/2018 0000   CREATININE 0.85 08/01/2013 1250   CALCIUM 9.1 12/25/2018 0000   GFRNONAA 74 12/25/2018 0000   GFRAA 85 12/25/2018 0000   Lab Results  Component Value Date   HGBA1C 5.7 (H) 12/25/2018   HGBA1C 6.1 08/05/2018   HGBA1C 6.2 02/01/2018   Lab Results  Component Value Date   INSULIN 9.5 12/25/2018   CBC    Component Value Date/Time   WBC 6.5 12/25/2018 0000   WBC 7.9 06/28/2017 1612   RBC 5.39 12/25/2018 0000   RBC 5.31 06/28/2017 1612   HGB 14.5 12/25/2018 0000   HGB 12.7 (L) 10/10/2010 0935   HCT 45.1 12/25/2018 0000   HCT 39.2 10/10/2010 0935   PLT 221.0 06/28/2017 1612   PLT 287 10/10/2010 0935   MCV 84 12/25/2018 0000   MCV 77 (L) 10/10/2010 0935   MCH 26.9 12/25/2018 0000   MCH 27.9 09/09/2016 1436   MCH 24.9 (L) 10/10/2010 0935   MCH 26.8 02/28/2010 2240   MCHC 32.2 12/25/2018 0000   MCHC 32.5 06/28/2017 1612   RDW 14.8 12/25/2018 0000   RDW 16.3 (H) 10/10/2010 0935   LYMPHSABS 1.8 12/25/2018 0000   LYMPHSABS 2.0 10/10/2010 0935   MONOABS 0.6 06/28/2017 1612   EOSABS 0.1 12/25/2018 0000   EOSABS 0.1 10/10/2010 0935   BASOSABS 0.0 12/25/2018 0000   BASOSABS 0.0 10/10/2010 0935   Iron/TIBC/Ferritin/ %Sat No results found for: IRON, TIBC, FERRITIN, IRONPCTSAT Lipid Panel     Component Value Date/Time   CHOL 122 12/25/2018 0000   TRIG 60 12/25/2018 0000   HDL 41 12/25/2018 0000   CHOLHDL 3 08/05/2018  0946   VLDL 12.4 08/05/2018 0946   LDLCALC 69 12/25/2018 0000   Hepatic Function Panel     Component Value Date/Time   PROT 6.4  12/25/2018 0000   ALBUMIN 4.0 12/25/2018 0000   AST 17 12/25/2018 0000   ALT 14 12/25/2018 0000   ALKPHOS 45 12/25/2018 0000   BILITOT 0.6 12/25/2018 0000   BILIDIR 0.1 11/26/2014 1049      Component Value Date/Time   TSH 0.920 12/25/2018 0000   TSH 1.38 06/28/2017 1612   TSH 1.04 06/29/2016 1643   Results for JAVEL, CARRANCO (MRN [id number]) as of 03/11/2019 13:47  Ref. Range 12/25/2018 00:00  Vitamin D, 25-Hydroxy Latest Ref Range: 30.0 - 100.0 ng/mL <4.0 (L)   OBESITY BEHAVIORAL INTERVENTION VISIT  Today's visit was #6   Starting weight: 309 lbs Starting date: 12/25/2018 Today's weight: 298 lbs Today's date: 03/10/2019 Total lbs lost to date: 11    03/10/2019  Height 5\' 11"  (1.803 m)  Weight 298 lb (135.2 kg)  BMI (Calculated) 41.58  BLOOD PRESSURE - SYSTOLIC [id number]  BLOOD PRESSURE - DIASTOLIC 79   Body Fat % [id number] %  Total Body Water (lbs) 134.2 lbs   ASK: We discussed the diagnosis of obesity with [name] today and Daniel Powers agreed to give Daniel Powers permission to discuss obesity behavioral modification therapy today.  ASSESS: Raemon has the diagnosis of obesity and his BMI today is 41.6. Albus is in the action stage of change.   ADVISE: Fathi was educated on the multiple health risks of obesity as well as the benefit of weight loss to improve his health. He was advised of the need for long term treatment and the importance of lifestyle modifications to improve his current health and to decrease his risk of future health problems.  AGREE: Multiple dietary modification options and treatment options were discussed and  Deshannon agreed to follow the recommendations documented in the above note.  ARRANGE: Kani was educated on the importance of frequent visits to treat obesity as outlined per CMS and USPSTF guidelines and agreed to schedule his next follow up appointment today.  I, Michaelene Song, am acting as transcriptionist for Ilene Qua, MD  I have reviewed the  above documentation for accuracy and completeness, and I agree with the above. - Ilene Qua, MD

## 2019-03-13 ENCOUNTER — Ambulatory Visit (INDEPENDENT_AMBULATORY_CARE_PROVIDER_SITE_OTHER): Payer: 59 | Admitting: *Deleted

## 2019-03-13 ENCOUNTER — Other Ambulatory Visit: Payer: Self-pay

## 2019-03-13 VITALS — Temp 97.3°F

## 2019-03-13 DIAGNOSIS — Z7901 Long term (current) use of anticoagulants: Secondary | ICD-10-CM

## 2019-03-13 LAB — POCT INR: INR: 2.7 (ref 2.0–3.0)

## 2019-03-13 NOTE — Progress Notes (Signed)
Pt here for INR check per Dr. Etter Sjogren   Goal INR =2.0-3.0  Last INR =2.5  Pt currently takes Coumadin 5 mg daily.  Pt denies recent antibiotics, no dietary changes and no unusual bruising / bleeding.  INR today = 2.7  Per Dr. Etter Sjogren stay on same dose and return in 6 weeks.  Left message voicemail for patient to call back to schedule for 6 weeks and also advised of plan.

## 2019-03-25 ENCOUNTER — Other Ambulatory Visit: Payer: Self-pay

## 2019-03-25 ENCOUNTER — Encounter (INDEPENDENT_AMBULATORY_CARE_PROVIDER_SITE_OTHER): Payer: Self-pay | Admitting: Family Medicine

## 2019-03-25 ENCOUNTER — Ambulatory Visit (INDEPENDENT_AMBULATORY_CARE_PROVIDER_SITE_OTHER): Payer: 59 | Admitting: Family Medicine

## 2019-03-25 DIAGNOSIS — Z6841 Body Mass Index (BMI) 40.0 and over, adult: Secondary | ICD-10-CM | POA: Diagnosis not present

## 2019-03-25 DIAGNOSIS — R7303 Prediabetes: Secondary | ICD-10-CM

## 2019-03-25 DIAGNOSIS — I10 Essential (primary) hypertension: Secondary | ICD-10-CM

## 2019-03-27 NOTE — Progress Notes (Signed)
Office: [phone]  /  Fax: [phone] TeleHealth Visit:  Daniel Powers has verbally consented to this TeleHealth visit today. The patient is located at home, the provider is located at the [facility] office. The participants in this visit include the listed provider and patient and any and all parties involved. The visit was conducted today via WebEx.  HPI:   Chief Complaint: OBESITY Daniel Powers is here to discuss his progress with his obesity treatment plan. He is on the Category 4 plan and is following his eating plan approximately 50 % of the time. He states he is exercising 0 minutes 0 times per week. Daniel Powers has a weight of 297 pounds this morning, and she voices that the last few weeks, he didn't follow the plan as well as he'd like, and he is often skipping meals (particularly lunch). He did voice that he ate off the plan at dinner. The next two weeks are busy with the upcoming move. We were unable to weigh the patient today for this TeleHealth visit. He feels as if he has lost weight since his last visit. He has lost 11 lbs since starting treatment with [name].  Hypertension Daniel Powers is a 63 y.o. male with hypertension.Daniel Powers blood pressure was previously controlled.  Daniel Powers denies chest pain. Daniel Powers is checking his blood pressure every day or two. He is working weight loss to help control his blood pressure with the goal of decreasing his risk of heart attack and stroke.   Pre-Diabetes Daniel Powers has a diagnosis of prediabetes based on his elevated Hgb A1c and was informed this puts him at greater risk of developing diabetes. He is not taking metformin currently and he is still experiencing carb cravings. Daniel Powers continues to work on diet and exercise to decrease risk of diabetes. He denies nausea or hypoglycemia.  ASSESSMENT AND PLAN:  Essential hypertension  Prediabetes  Class 3 severe obesity with serious comorbidity and body mass index (BMI) of 40.0 to 44.9  in adult, unspecified obesity type ([place])  PLAN:  Hypertension We discussed sodium restriction, working on healthy weight loss, and a regular exercise program as the means to achieve improved blood pressure control. Daniel Powers agreed with this plan and agreed to follow up as directed. We will continue to monitor his blood pressure as well as his progress with the above lifestyle modifications. He will continue his medications (no refill needed) and he will watch for signs of hypotension as he continues his lifestyle modifications.  Pre-Diabetes Daniel Powers will continue to work on weight loss, exercise, and decreasing simple carbohydrates in his diet to help decrease the risk of diabetes. He was informed that eating too many simple carbohydrates or too many calories at one sitting increases the likelihood of GI side effects. We will repeat labs at the next appointment and Daniel Powers agreed to follow up with [name] as directed to monitor his progress.  Obesity Daniel Powers is currently in the action stage of change. As such, his goal is to continue with weight loss efforts He has agreed to follow the Category 4 plan Daniel Powers has been instructed to work up to a goal of 150 minutes of combined cardio and strengthening exercise per week for weight loss and overall health benefits. We discussed the following Behavioral Modification Strategies today: planning for success, keeping healthy foods in the home, increasing lean protein intake, increasing vegetables and work on meal planning and easy cooking plans  Daniel Powers has agreed to follow up with our clinic  in 3 weeks. He was informed of the importance of frequent follow up visits to maximize his success with intensive lifestyle modifications for his multiple health conditions.  ALLERGIES: Allergies  Allergen Reactions  . Penicillins     REACTION: Penicillin    MEDICATIONS: Current Outpatient Medications on File Prior to Visit  Medication Sig Dispense Refill  . albuterol  (PROAIR HFA) 108 (90 Base) MCG/ACT inhaler Inhale 2 puffs into the lungs every 6 (six) hours as needed for wheezing. 1 Inhaler 2  . ALPRAZolam (XANAX) 0.5 MG tablet Take 0.5 mg by mouth at bedtime as needed.    [name] amLODipine (NORVASC) 5 MG tablet TAKE 1 TABLET(5 MG) BY MOUTH DAILY 90 tablet 1  . atorvastatin (LIPITOR) 10 MG tablet TAKE 1 TABLET(10 MG) BY MOUTH DAILY AT 6 PM 90 tablet 0  . cetirizine (ZYRTEC) 10 MG tablet Take 10 mg by mouth daily.    [name] CINNAMON PO Take 1 tablet by mouth daily.    . fish oil-omega-3 fatty acids 1000 MG capsule Take 1 g by mouth daily.    . Flaxseed, Linseed, 1000 MG CAPS Take 1 capsule by mouth daily.    . fluticasone (FLONASE) 50 MCG/ACT nasal spray Place 2 sprays into both nostrils daily. 16 g 6  . olmesartan (BENICAR) 20 MG tablet TAKE 1 TABLET(20 MG) BY MOUTH DAILY 90 tablet 1  . tadalafil (CIALIS) 20 MG tablet Take 20 mg by mouth daily as needed.    . Vitamin D, Ergocalciferol, (DRISDOL) 1.25 MG (50000 UT) CAPS capsule Take 1 capsule (50,000 Units total) by mouth every 7 (seven) days. 4 capsule 0  . warfarin (COUMADIN) 5 MG tablet TAKE AS DIRECTED BY COUMADIN CLINIC. 90 tablet 1   Current Facility-Administered Medications on File Prior to Visit  Medication Dose Route Frequency Provider Last Rate Last Dose  . 0.9 %  sodium chloride infusion  500 mL Intravenous Continuous Armbruster, [name], MD        PAST MEDICAL HISTORY: Past Medical History:  Diagnosis Date  . Anxiety   . Arthritis   . Asthma   . COPD (chronic obstructive pulmonary disease) ([place])   . DVT (deep venous thrombosis) ([place])   . Gout   . High cholesterol   . Hypertension   . Influenza with pneumonia   . Lactose intolerance   . Obesity   . Pre-diabetes   . Psychosexual dysfunction with inhibited sexual excitement     PAST SURGICAL HISTORY: Past Surgical History:  Procedure Laterality Date  . HAND SURGERY     right  . TONSILLECTOMY AND ADENOIDECTOMY      SOCIAL HISTORY:  Social History   Tobacco Use  . Smoking status: Never Smoker  . Smokeless tobacco: Never Used  Substance Use Topics  . Alcohol use: Yes    Comment: ocass- once or twice a month  . Drug use: No    FAMILY HISTORY: Family History  Problem Relation Age of Onset  . Hypertension Mother   . Alzheimer's disease Mother   . Hypertension Father   . Alcohol abuse Father   . Obesity Father   . Cancer Sister        "male organs"  . Lung cancer Maternal Uncle   . Esophageal cancer Maternal Uncle   . Hypertension Maternal Grandmother   . Alzheimer's disease Maternal Grandmother   . Hypertension Maternal Grandfather   . Hypertension Paternal Grandmother   . Hypertension Paternal Grandfather   . Heart disease Brother 104  MI  . Alzheimer's disease Paternal Aunt   . Colon cancer Neg Hx     ROS: Review of Systems  Constitutional: Positive for weight loss.  Cardiovascular: Negative for chest pain.  Gastrointestinal: Negative for nausea.  Endo/Heme/Allergies:       Positive for carb cravings Negative for hypoglycemia    PHYSICAL EXAM: Pt in no acute distress  RECENT LABS AND TESTS: BMET    Component Value Date/Time   NA 139 12/25/2018 0000   K 4.3 12/25/2018 0000   CL 104 12/25/2018 0000   CO2 20 12/25/2018 0000   GLUCOSE 98 12/25/2018 0000   GLUCOSE 96 08/05/2018 0946   GLUCOSE 85 05/07/2006 1230   BUN 19 12/25/2018 0000   CREATININE 1.06 12/25/2018 0000   CREATININE 0.85 08/01/2013 1250   CALCIUM 9.1 12/25/2018 0000   GFRNONAA 74 12/25/2018 0000   GFRAA 85 12/25/2018 0000   Lab Results  Component Value Date   HGBA1C 5.7 (H) 12/25/2018   HGBA1C 6.1 08/05/2018   HGBA1C 6.2 02/01/2018   Lab Results  Component Value Date   INSULIN 9.5 12/25/2018   CBC    Component Value Date/Time   WBC 6.5 12/25/2018 0000   WBC 7.9 06/28/2017 1612   RBC 5.39 12/25/2018 0000   RBC 5.31 06/28/2017 1612   HGB 14.5 12/25/2018 0000   HGB 12.7 (L) 10/10/2010 0935   HCT  45.1 12/25/2018 0000   HCT 39.2 10/10/2010 0935   PLT 221.0 06/28/2017 1612   PLT 287 10/10/2010 0935   MCV 84 12/25/2018 0000   MCV 77 (L) 10/10/2010 0935   MCH 26.9 12/25/2018 0000   MCH 27.9 09/09/2016 1436   MCH 24.9 (L) 10/10/2010 0935   MCH 26.8 02/28/2010 2240   MCHC 32.2 12/25/2018 0000   MCHC 32.5 06/28/2017 1612   RDW 14.8 12/25/2018 0000   RDW 16.3 (H) 10/10/2010 0935   LYMPHSABS 1.8 12/25/2018 0000   LYMPHSABS 2.0 10/10/2010 0935   MONOABS 0.6 06/28/2017 1612   EOSABS 0.1 12/25/2018 0000   EOSABS 0.1 10/10/2010 0935   BASOSABS 0.0 12/25/2018 0000   BASOSABS 0.0 10/10/2010 0935   Iron/TIBC/Ferritin/ %Sat No results found for: IRON, TIBC, FERRITIN, IRONPCTSAT Lipid Panel     Component Value Date/Time   CHOL 122 12/25/2018 0000   TRIG 60 12/25/2018 0000   HDL 41 12/25/2018 0000   CHOLHDL 3 08/05/2018 0946   VLDL 12.4 08/05/2018 0946   LDLCALC 69 12/25/2018 0000   Hepatic Function Panel     Component Value Date/Time   PROT 6.4 12/25/2018 0000   ALBUMIN 4.0 12/25/2018 0000   AST 17 12/25/2018 0000   ALT 14 12/25/2018 0000   ALKPHOS 45 12/25/2018 0000   BILITOT 0.6 12/25/2018 0000   BILIDIR 0.1 11/26/2014 1049      Component Value Date/Time   TSH 0.920 12/25/2018 0000   TSH 1.38 06/28/2017 1612   TSH 1.04 06/29/2016 1643     Ref. Range 12/25/2018 00:00  Vitamin D, 25-Hydroxy Latest Ref Range: 30.0 - 100.0 ng/mL <4.0 (L)    I, Doreene Nest, am acting as transcriptionist for Eber Jones, MD  I have reviewed the above documentation for accuracy and completeness, and I agree with the above. - Ilene Qua, MD

## 2019-04-09 ENCOUNTER — Other Ambulatory Visit (INDEPENDENT_AMBULATORY_CARE_PROVIDER_SITE_OTHER): Payer: Self-pay | Admitting: Family Medicine

## 2019-04-09 DIAGNOSIS — E559 Vitamin D deficiency, unspecified: Secondary | ICD-10-CM

## 2019-04-15 ENCOUNTER — Ambulatory Visit (INDEPENDENT_AMBULATORY_CARE_PROVIDER_SITE_OTHER): Payer: 59 | Admitting: Family Medicine

## 2019-04-15 ENCOUNTER — Other Ambulatory Visit: Payer: Self-pay

## 2019-04-15 VITALS — BP 129/75 | HR 74 | Temp 97.9°F | Ht 71.0 in | Wt 297.0 lb

## 2019-04-15 DIAGNOSIS — Z9189 Other specified personal risk factors, not elsewhere classified: Secondary | ICD-10-CM

## 2019-04-15 DIAGNOSIS — E559 Vitamin D deficiency, unspecified: Secondary | ICD-10-CM | POA: Diagnosis not present

## 2019-04-15 DIAGNOSIS — Z6841 Body Mass Index (BMI) 40.0 and over, adult: Secondary | ICD-10-CM

## 2019-04-15 DIAGNOSIS — I10 Essential (primary) hypertension: Secondary | ICD-10-CM | POA: Diagnosis not present

## 2019-04-15 MED ORDER — VITAMIN D (ERGOCALCIFEROL) 1.25 MG (50000 UNIT) PO CAPS: 50000.0000 [IU] | ORAL_CAPSULE | ORAL | 0 refills | Status: DC

## 2019-04-16 NOTE — Progress Notes (Signed)
Office: [phone]  /  Fax: [phone]   HPI:   Chief Complaint: OBESITY Daniel Powers is here to discuss his progress with his obesity treatment plan. He is on the Category 4 plan and is following his eating plan approximately 25 % of the time. He states he is exercising 0 minutes 0 times per week. Daniel Powers is still currently in process of moving offices and therefore has not followed the plan much at all. His refrigerator is broken and he is currently awaiting a replacement due to needing to special order.  His weight is 297 lb (134.7 kg) today and has had a weight loss of 1 pound over a period of 5 weeks since his last visit. He has lost 12 lbs since starting treatment with [name].  Vitamin D Deficiency Daniel Powers has a diagnosis of vitamin D deficiency. He is currently taking prescription Vit D. He notes fatigue and denies nausea, vomiting or muscle weakness.  At risk for osteopenia and osteoporosis Daniel Powers is at higher risk of osteopenia and osteoporosis due to vitamin D deficiency.   Hypertension Daniel Powers is a 63 y.o. male with hypertension. Daniel Powers's blood pressure is controlled. He denies chest pain, chest pressure, or headaches. He is working on weight loss to help control his blood pressure with the goal of decreasing his risk of heart attack and stroke.  ASSESSMENT AND PLAN:  Essential hypertension  Vitamin D deficiency - Plan: Vitamin D, Ergocalciferol, (DRISDOL) 1.25 MG (50000 UT) CAPS capsule  At risk for osteoporosis  Class 3 severe obesity with serious comorbidity and body mass index (BMI) of 40.0 to 44.9 in adult, unspecified obesity type ([place])  PLAN:  Vitamin D Deficiency Daniel Powers was informed that low vitamin D levels contributes to fatigue and are associated with obesity, breast, and colon cancer. Daniel Powers agrees to continue taking prescription Vit D 50,000 IU every week #4 and we will refill for 1 month. He will follow up for routine testing of vitamin D, at least 2-3 times  per year. He was informed of the risk of over-replacement of vitamin D and agrees to not increase his dose unless he discusses this with [name] first. Daniel Powers agrees to follow up with our clinic in 2 weeks.  At risk for osteopenia and osteoporosis Daniel Powers was given extended (15 minutes) osteoporosis prevention counseling today. Daniel Powers is at risk for osteopenia and osteoporsis due to his vitamin D deficiency. He was encouraged to take his vitamin D and follow his higher calcium diet and increase strengthening exercise to help strengthen his bones and decrease his risk of osteopenia and osteoporosis.  Hypertension We discussed sodium restriction, working on healthy weight loss, and a regular exercise program as the means to achieve improved blood pressure control. Daniel Powers agreed with this plan and agreed to follow up as directed. We will continue to monitor his blood pressure as well as his progress with the above lifestyle modifications. Daniel Powers agrees to continue taking amlodipine, no refill needed. He will watch for signs of hypotension as he continues his lifestyle modifications. [name] agrees to follow up with our clinic in 2 weeks.  Obesity Daniel Powers is currently in the action stage of change. As such, his goal is to continue with weight loss efforts He has agreed to keep a food journal with 1650-1800 calories and 125+ grams of protein daily Daniel Powers has been instructed to work up to a goal of 150 minutes of combined cardio and strengthening exercise per week for weight loss and overall health benefits. We  discussed the following Behavioral Modification Strategies today: increasing lean protein intake, increasing vegetables and work on meal planning and easy cooking plans, keeping healthy foods in the home, and planning for success   Daniel Powers has agreed to follow up with our clinic in 2 weeks. He was informed of the importance of frequent follow up visits to maximize his success with intensive lifestyle  modifications for his multiple health conditions.  ALLERGIES: Allergies  Allergen Reactions   Penicillins     REACTION: Penicillin    MEDICATIONS: Current Outpatient Medications on File Prior to Visit  Medication Sig Dispense Refill   albuterol (PROAIR HFA) 108 (90 Base) MCG/ACT inhaler Inhale 2 puffs into the lungs every 6 (six) hours as needed for wheezing. 1 Inhaler 2   amLODipine (NORVASC) 5 MG tablet TAKE 1 TABLET(5 MG) BY MOUTH DAILY 90 tablet 1   atorvastatin (LIPITOR) 10 MG tablet TAKE 1 TABLET(10 MG) BY MOUTH DAILY AT 6 PM 90 tablet 0   cetirizine (ZYRTEC) 10 MG tablet Take 10 mg by mouth daily.     CINNAMON PO Take 1 tablet by mouth daily.     fish oil-omega-3 fatty acids 1000 MG capsule Take 1 g by mouth daily.     Flaxseed, Linseed, 1000 MG CAPS Take 1 capsule by mouth daily.     fluticasone (FLONASE) 50 MCG/ACT nasal spray Place 2 sprays into both nostrils daily. 16 g 6   olmesartan (BENICAR) 20 MG tablet TAKE 1 TABLET(20 MG) BY MOUTH DAILY 90 tablet 1   tadalafil (CIALIS) 20 MG tablet Take 20 mg by mouth daily as needed.     warfarin (COUMADIN) 5 MG tablet TAKE AS DIRECTED BY COUMADIN CLINIC. 90 tablet 1   Current Facility-Administered Medications on File Prior to Visit  Medication Dose Route Frequency Provider Last Rate Last Dose   0.9 %  sodium chloride infusion  500 mL Intravenous Continuous Armbruster, [name], MD        PAST MEDICAL HISTORY: Past Medical History:  Diagnosis Date   Anxiety    Arthritis    Asthma    COPD (chronic obstructive pulmonary disease) ([facility])    DVT (deep venous thrombosis) ([facility])    Gout    High cholesterol    Hypertension    Influenza with pneumonia    Lactose intolerance    Obesity    Pre-diabetes    Psychosexual dysfunction with inhibited sexual excitement     PAST SURGICAL HISTORY: Past Surgical History:  Procedure Laterality Date   HAND SURGERY     right   TONSILLECTOMY AND ADENOIDECTOMY        SOCIAL HISTORY: Social History   Tobacco Use   Smoking status: Never Smoker   Smokeless tobacco: Never Used  Substance Use Topics   Alcohol use: Yes    Comment: ocass- once or twice a month   Drug use: No    FAMILY HISTORY: Family History  Problem Relation Age of Onset   Hypertension Mother    Alzheimer's disease Mother    Hypertension Father    Alcohol abuse Father    Obesity Father    Cancer Sister        "male organs"   Lung cancer Maternal Uncle    Esophageal cancer Maternal Uncle    Hypertension Maternal Grandmother    Alzheimer's disease Maternal Grandmother    Hypertension Maternal Grandfather    Hypertension Paternal Grandmother    Hypertension Paternal Grandfather    Heart disease Brother 67  MI   Alzheimer's disease Paternal Aunt    Colon cancer Neg Hx     ROS: Review of Systems  Constitutional: Positive for malaise/fatigue and weight loss.  Cardiovascular: Negative for chest pain.       Negative chest pressure  Gastrointestinal: Negative for nausea and vomiting.  Musculoskeletal:       Negative muscle weakness  Neurological: Negative for headaches.    PHYSICAL EXAM: Blood pressure 129/75, pulse 74, temperature 97.9 F (36.6 C), temperature source Oral, height 5\' 11"  (1.803 m), weight 297 lb (134.7 kg), SpO2 99 %. Body mass index is 41.42 kg/m. Physical Exam Vitals signs reviewed.  Constitutional:      Appearance: Normal appearance. He is obese.  Cardiovascular:     Rate and Rhythm: Normal rate.     Pulses: Normal pulses.  Pulmonary:     Effort: Pulmonary effort is normal.     Breath sounds: Normal breath sounds.  Musculoskeletal: Normal range of motion.  Skin:    General: Skin is warm and dry.  Neurological:     Mental Status: He is alert and oriented to person, place, and time.  Psychiatric:        Mood and Affect: Mood normal.        Behavior: Behavior normal.     RECENT LABS AND TESTS: BMET     Component Value Date/Time   NA 139 12/25/2018 0000   K 4.3 12/25/2018 0000   CL 104 12/25/2018 0000   CO2 20 12/25/2018 0000   GLUCOSE 98 12/25/2018 0000   GLUCOSE 96 08/05/2018 0946   GLUCOSE 85 05/07/2006 1230   BUN 19 12/25/2018 0000   CREATININE 1.06 12/25/2018 0000   CREATININE 0.85 08/01/2013 1250   CALCIUM 9.1 12/25/2018 0000   GFRNONAA 74 12/25/2018 0000   GFRAA 85 12/25/2018 0000   Lab Results  Component Value Date   HGBA1C 5.7 (H) 12/25/2018   HGBA1C 6.1 08/05/2018   HGBA1C 6.2 02/01/2018   Lab Results  Component Value Date   INSULIN 9.5 12/25/2018   CBC    Component Value Date/Time   WBC 6.5 12/25/2018 0000   WBC 7.9 06/28/2017 1612   RBC 5.39 12/25/2018 0000   RBC 5.31 06/28/2017 1612   HGB 14.5 12/25/2018 0000   HGB 12.7 (L) 10/10/2010 0935   HCT 45.1 12/25/2018 0000   HCT 39.2 10/10/2010 0935   PLT 221.0 06/28/2017 1612   PLT 287 10/10/2010 0935   MCV 84 12/25/2018 0000   MCV 77 (L) 10/10/2010 0935   MCH 26.9 12/25/2018 0000   MCH 27.9 09/09/2016 1436   MCH 24.9 (L) 10/10/2010 0935   MCH 26.8 02/28/2010 2240   MCHC 32.2 12/25/2018 0000   MCHC 32.5 06/28/2017 1612   RDW 14.8 12/25/2018 0000   RDW 16.3 (H) 10/10/2010 0935   LYMPHSABS 1.8 12/25/2018 0000   LYMPHSABS 2.0 10/10/2010 0935   MONOABS 0.6 06/28/2017 1612   EOSABS 0.1 12/25/2018 0000   EOSABS 0.1 10/10/2010 0935   BASOSABS 0.0 12/25/2018 0000   BASOSABS 0.0 10/10/2010 0935   Iron/TIBC/Ferritin/ %Sat No results found for: IRON, TIBC, FERRITIN, IRONPCTSAT Lipid Panel     Component Value Date/Time   CHOL 122 12/25/2018 0000   TRIG 60 12/25/2018 0000   HDL 41 12/25/2018 0000   CHOLHDL 3 08/05/2018 0946   VLDL 12.4 08/05/2018 0946   LDLCALC 69 12/25/2018 0000   Hepatic Function Panel     Component Value Date/Time   PROT 6.4 12/25/2018  0000   ALBUMIN 4.0 12/25/2018 0000   AST 17 12/25/2018 0000   ALT 14 12/25/2018 0000   ALKPHOS 45 12/25/2018 0000   BILITOT 0.6 12/25/2018  0000   BILIDIR 0.1 11/26/2014 1049      Component Value Date/Time   TSH 0.920 12/25/2018 0000   TSH 1.38 06/28/2017 1612   TSH 1.04 06/29/2016 1643      OBESITY BEHAVIORAL INTERVENTION VISIT  Today's visit was # 8   Starting weight: 309 lbs Starting date: 12/25/2018 Today's weight : 297 lbs  Today's date: 04/15/2019 Total lbs lost to date: 12    ASK: We discussed the diagnosis of obesity with Daniel Powers today and Daniel Powers agreed to give [name] permission to discuss obesity behavioral modification therapy today.  ASSESS: Daniel Powers has the diagnosis of obesity and his BMI today is 41.44 Daniel Powers is in the action stage of change   ADVISE: Daniel Powers was educated on the multiple health risks of obesity as well as the benefit of weight loss to improve his health. He was advised of the need for long term treatment and the importance of lifestyle modifications to improve his current health and to decrease his risk of future health problems.  AGREE: Multiple dietary modification options and treatment options were discussed and  Marce agreed to follow the recommendations documented in the above note.  ARRANGE: Rowley was educated on the importance of frequent visits to treat obesity as outlined per CMS and USPSTF guidelines and agreed to schedule his next follow up appointment today.  I, Trixie Dredge, am acting as transcriptionist for Ilene Qua, MD  I have reviewed the above documentation for accuracy and completeness, and I agree with the above. - Ilene Qua, MD

## 2019-04-21 ENCOUNTER — Other Ambulatory Visit (INDEPENDENT_AMBULATORY_CARE_PROVIDER_SITE_OTHER): Payer: Self-pay | Admitting: Family Medicine

## 2019-04-21 DIAGNOSIS — E559 Vitamin D deficiency, unspecified: Secondary | ICD-10-CM

## 2019-04-24 ENCOUNTER — Ambulatory Visit (INDEPENDENT_AMBULATORY_CARE_PROVIDER_SITE_OTHER): Payer: 59 | Admitting: *Deleted

## 2019-04-24 ENCOUNTER — Other Ambulatory Visit: Payer: Self-pay

## 2019-04-24 DIAGNOSIS — Z7901 Long term (current) use of anticoagulants: Secondary | ICD-10-CM | POA: Diagnosis not present

## 2019-04-24 DIAGNOSIS — Z23 Encounter for immunization: Secondary | ICD-10-CM

## 2019-04-24 LAB — POCT INR: INR: 2.5 (ref 2.0–3.0)

## 2019-04-24 NOTE — Progress Notes (Signed)
Pt here for INR check perDr. Etter Sjogren  Goal INR =2.0-3.0  Last INR =2.7  Pt currently takes Coumadin5 mg daily.  Pt denies recent antibiotics, no dietary changes and no unusual bruising / bleeding.  INR today =2.5   Per Dr. Etter Sjogren continue same dose and recheck in 4 to 6 weeks  Next appointment schedule.

## 2019-04-30 ENCOUNTER — Other Ambulatory Visit: Payer: Self-pay

## 2019-04-30 ENCOUNTER — Ambulatory Visit (INDEPENDENT_AMBULATORY_CARE_PROVIDER_SITE_OTHER): Payer: 59 | Admitting: Family Medicine

## 2019-04-30 VITALS — BP 131/75 | HR 70 | Temp 98.1°F | Ht 71.0 in | Wt 297.0 lb

## 2019-04-30 DIAGNOSIS — Z6841 Body Mass Index (BMI) 40.0 and over, adult: Secondary | ICD-10-CM

## 2019-04-30 DIAGNOSIS — E559 Vitamin D deficiency, unspecified: Secondary | ICD-10-CM

## 2019-04-30 DIAGNOSIS — R7303 Prediabetes: Secondary | ICD-10-CM

## 2019-05-01 NOTE — Progress Notes (Signed)
Office: [phone]  /  Fax: [phone]   HPI:   Chief Complaint: OBESITY Daniel Powers is here to discuss his progress with his obesity treatment plan. He is on the keep a food journal with 1650-1800 calories and 125+ grams of protein daily and is following his eating plan approximately 50 % of the time. He states he is exercising 0 minutes 0 times per week. Daniel Powers was only able to commit to journaling 50 % of the time. He is all moved in and getting settled after changing offices. He is hoping to get back into the previous plan now that his refrigerator is delivered.  His weight is 297 lb (134.7 kg) today and has not lost weight since his last visit. He has lost 12 lbs since starting treatment with [name].  Pre-Diabetes Daniel Powers has a diagnosis of pre-diabetes based on his elevated Hgb A1c and was informed this puts him at greater risk of developing diabetes. Last Hgb A1c was of 5.7. He is not taking metformin currently and notes occasional cravings. He continues to work on diet and exercise to decrease risk of diabetes. He denies nausea or hypoglycemia.  Vitamin D Deficiency Daniel Powers has a diagnosis of vitamin D deficiency. He is currently taking prescription Vit D. He notes fatigue and denies nausea, vomiting or muscle weakness.  ASSESSMENT AND PLAN:  Prediabetes  Vitamin D deficiency  Class 3 severe obesity with serious comorbidity and body mass index (BMI) of 40.0 to 44.9 in adult, unspecified obesity type Daniel Powers)  PLAN:  Pre-Diabetes Daniel Powers will continue to work on weight loss, exercise, and decreasing simple carbohydrates in his diet to help decrease the risk of diabetes. We dicussed metformin including benefits and risks. He was informed that eating too many simple carbohydrates or too many calories at one sitting increases the likelihood of GI side effects. We will repeat labs in November. Daniel Powers agrees to follow up with [name] as directed to monitor his progress.  Vitamin D Deficiency  Daniel Powers was informed that low vitamin D levels contributes to fatigue and are associated with obesity, breast, and colon cancer. Daniel Powers agrees to continue taking prescription Vit D 50,000 IU every week, no refill needed. He will follow up for routine testing of vitamin D, at least 2-3 times per year. He was informed of the risk of over-replacement of vitamin D and agrees to not increase his dose unless he discusses this with [name] first. Daniel Powers agrees to follow up with our clinic in 2 weeks.  I spent > than 50% of the 15 minute visit on counseling as documented in the note.  Obesity Daniel Powers is currently in the action stage of change. As such, his goal is to continue with weight loss efforts He has agreed to follow the Category 4 plan Daniel Powers has been instructed to work up to a goal of 150 minutes of combined cardio and strengthening exercise per week for weight loss and overall health benefits. We discussed the following Behavioral Modification Strategies today: increasing lean protein intake, increasing vegetables and work on meal planning and easy cooking plans, keeping healthy foods in the home, and planning for success   Daniel Powers has agreed to follow up with our clinic in 2 weeks. He was informed of the importance of frequent follow up visits to maximize his success with intensive lifestyle modifications for his multiple health conditions.  ALLERGIES: Allergies  Allergen Reactions  . Penicillins     REACTION: Penicillin    MEDICATIONS: Current Outpatient Medications on File Prior to  Visit  Medication Sig Dispense Refill  . albuterol (PROAIR HFA) 108 (90 Base) MCG/ACT inhaler Inhale 2 puffs into the lungs every 6 (six) hours as needed for wheezing. 1 Inhaler 2  . amLODipine (NORVASC) 5 MG tablet TAKE 1 TABLET(5 MG) BY MOUTH DAILY 90 tablet 1  . atorvastatin (LIPITOR) 10 MG tablet TAKE 1 TABLET(10 MG) BY MOUTH DAILY AT 6 PM 90 tablet 0  . cetirizine (ZYRTEC) 10 MG tablet Take 10 mg by mouth  daily.    [name] CINNAMON PO Take 1 tablet by mouth daily.    . fish oil-omega-3 fatty acids 1000 MG capsule Take 1 g by mouth daily.    . Flaxseed, Linseed, 1000 MG CAPS Take 1 capsule by mouth daily.    . fluticasone (FLONASE) 50 MCG/ACT nasal spray Place 2 sprays into both nostrils daily. 16 g 6  . olmesartan (BENICAR) 20 MG tablet TAKE 1 TABLET(20 MG) BY MOUTH DAILY 90 tablet 1  . tadalafil (CIALIS) 20 MG tablet Take 20 mg by mouth daily as needed.    . Vitamin D, Ergocalciferol, (DRISDOL) 1.25 MG (50000 UT) CAPS capsule Take 1 capsule (50,000 Units total) by mouth every 7 (seven) days. 4 capsule 0  . warfarin (COUMADIN) 5 MG tablet TAKE AS DIRECTED BY COUMADIN CLINIC. 90 tablet 1   Current Facility-Administered Medications on File Prior to Visit  Medication Dose Route Frequency Provider Last Rate Last Dose  . 0.9 %  sodium chloride infusion  500 mL Intravenous Continuous Armbruster, [name], MD        PAST MEDICAL HISTORY: Past Medical History:  Diagnosis Date  . Anxiety   . Arthritis   . Asthma   . COPD (chronic obstructive pulmonary disease) ([place])   . DVT (deep venous thrombosis) ([place])   . Gout   . High cholesterol   . Hypertension   . Influenza with pneumonia   . Lactose intolerance   . Obesity   . Pre-diabetes   . Psychosexual dysfunction with inhibited sexual excitement     PAST SURGICAL HISTORY: Past Surgical History:  Procedure Laterality Date  . HAND SURGERY     right  . TONSILLECTOMY AND ADENOIDECTOMY      SOCIAL HISTORY: Social History   Tobacco Use  . Smoking status: Never Smoker  . Smokeless tobacco: Never Used  Substance Use Topics  . Alcohol use: Yes    Comment: ocass- once or twice a month  . Drug use: No    FAMILY HISTORY: Family History  Problem Relation Age of Onset  . Hypertension Mother   . Alzheimer's disease Mother   . Hypertension Father   . Alcohol abuse Father   . Obesity Father   . Cancer Sister        "male organs"  . Lung  cancer Maternal Uncle   . Esophageal cancer Maternal Uncle   . Hypertension Maternal Grandmother   . Alzheimer's disease Maternal Grandmother   . Hypertension Maternal Grandfather   . Hypertension Paternal Grandmother   . Hypertension Paternal Grandfather   . Heart disease Brother 59       MI  . Alzheimer's disease Paternal Aunt   . Colon cancer Neg Hx     ROS: Review of Systems  Constitutional: Positive for malaise/fatigue. Negative for weight loss.  Gastrointestinal: Negative for nausea and vomiting.  Musculoskeletal:       Negative muscle weakness  Endo/Heme/Allergies:       Negative hypoglycemia    PHYSICAL EXAM: Blood  pressure 131/75, pulse 70, temperature 98.1 F (36.7 C), temperature source Oral, height 5\' 11"  (1.803 m), weight 297 lb (134.7 kg), SpO2 98 %. Body mass index is 41.42 kg/m. Physical Exam Vitals signs reviewed.  Constitutional:      Appearance: Normal appearance. He is obese.  Cardiovascular:     Rate and Rhythm: Normal rate.     Pulses: Normal pulses.  Pulmonary:     Effort: Pulmonary effort is normal.     Breath sounds: Normal breath sounds.  Musculoskeletal: Normal range of motion.  Skin:    General: Skin is warm and dry.  Neurological:     Mental Status: He is alert and oriented to person, place, and time.  Psychiatric:        Mood and Affect: Mood normal.        Behavior: Behavior normal.     RECENT LABS AND TESTS: BMET    Component Value Date/Time   NA 139 12/25/2018 0000   K 4.3 12/25/2018 0000   CL 104 12/25/2018 0000   CO2 20 12/25/2018 0000   GLUCOSE 98 12/25/2018 0000   GLUCOSE 96 08/05/2018 0946   GLUCOSE 85 05/07/2006 1230   BUN 19 12/25/2018 0000   CREATININE 1.06 12/25/2018 0000   CREATININE 0.85 08/01/2013 1250   CALCIUM 9.1 12/25/2018 0000   GFRNONAA 74 12/25/2018 0000   GFRAA 85 12/25/2018 0000   Lab Results  Component Value Date   HGBA1C 5.7 (H) 12/25/2018   HGBA1C 6.1 08/05/2018   HGBA1C 6.2 02/01/2018    Lab Results  Component Value Date   INSULIN 9.5 12/25/2018   CBC    Component Value Date/Time   WBC 6.5 12/25/2018 0000   WBC 7.9 06/28/2017 1612   RBC 5.39 12/25/2018 0000   RBC 5.31 06/28/2017 1612   HGB 14.5 12/25/2018 0000   HGB 12.7 (L) 10/10/2010 0935   HCT 45.1 12/25/2018 0000   HCT 39.2 10/10/2010 0935   PLT 221.0 06/28/2017 1612   PLT 287 10/10/2010 0935   MCV 84 12/25/2018 0000   MCV 77 (L) 10/10/2010 0935   MCH 26.9 12/25/2018 0000   MCH 27.9 09/09/2016 1436   MCH 24.9 (L) 10/10/2010 0935   MCH 26.8 02/28/2010 2240   MCHC 32.2 12/25/2018 0000   MCHC 32.5 06/28/2017 1612   RDW 14.8 12/25/2018 0000   RDW 16.3 (H) 10/10/2010 0935   LYMPHSABS 1.8 12/25/2018 0000   LYMPHSABS 2.0 10/10/2010 0935   MONOABS 0.6 06/28/2017 1612   EOSABS 0.1 12/25/2018 0000   EOSABS 0.1 10/10/2010 0935   BASOSABS 0.0 12/25/2018 0000   BASOSABS 0.0 10/10/2010 0935   Iron/TIBC/Ferritin/ %Sat No results found for: IRON, TIBC, FERRITIN, IRONPCTSAT Lipid Panel     Component Value Date/Time   CHOL 122 12/25/2018 0000   TRIG 60 12/25/2018 0000   HDL 41 12/25/2018 0000   CHOLHDL 3 08/05/2018 0946   VLDL 12.4 08/05/2018 0946   LDLCALC 69 12/25/2018 0000   Hepatic Function Panel     Component Value Date/Time   PROT 6.4 12/25/2018 0000   ALBUMIN 4.0 12/25/2018 0000   AST 17 12/25/2018 0000   ALT 14 12/25/2018 0000   ALKPHOS 45 12/25/2018 0000   BILITOT 0.6 12/25/2018 0000   BILIDIR 0.1 11/26/2014 1049      Component Value Date/Time   TSH 0.920 12/25/2018 0000   TSH 1.38 06/28/2017 1612   TSH 1.04 06/29/2016 1643      OBESITY BEHAVIORAL INTERVENTION VISIT  Today's  visit was # 9   Starting weight: 309 lbs Starting date: 12/25/2018 Today's weight : 297 lbs Today's date: 04/30/2019 Total lbs lost to date: 12    ASK: We discussed the diagnosis of obesity with Daniel Powers today and Daniel Powers agreed to give [name] permission to discuss obesity behavioral modification therapy  today.  ASSESS: Daniel Powers has the diagnosis of obesity and his BMI today is 41.44 Daniel Powers is in the action stage of change   ADVISE: Daniel Powers was educated on the multiple health risks of obesity as well as the benefit of weight loss to improve his health. He was advised of the need for long term treatment and the importance of lifestyle modifications to improve his current health and to decrease his risk of future health problems.  AGREE: Multiple dietary modification options and treatment options were discussed and  Daniel Powers agreed to follow the recommendations documented in the above note.  ARRANGE: Daniel Powers was educated on the importance of frequent visits to treat obesity as outlined per CMS and USPSTF guidelines and agreed to schedule his next follow up appointment today.  I, Trixie Dredge, am acting as transcriptionist for Ilene Qua, MD  I have reviewed the above documentation for accuracy and completeness, and I agree with the above. - Ilene Qua, MD

## 2019-05-16 ENCOUNTER — Other Ambulatory Visit (INDEPENDENT_AMBULATORY_CARE_PROVIDER_SITE_OTHER): Payer: Self-pay | Admitting: Family Medicine

## 2019-05-16 DIAGNOSIS — E559 Vitamin D deficiency, unspecified: Secondary | ICD-10-CM

## 2019-05-20 ENCOUNTER — Encounter (INDEPENDENT_AMBULATORY_CARE_PROVIDER_SITE_OTHER): Payer: Self-pay | Admitting: Physician Assistant

## 2019-05-20 ENCOUNTER — Ambulatory Visit (INDEPENDENT_AMBULATORY_CARE_PROVIDER_SITE_OTHER): Payer: 59 | Admitting: Physician Assistant

## 2019-05-20 ENCOUNTER — Other Ambulatory Visit: Payer: Self-pay

## 2019-05-20 VITALS — BP 129/78 | HR 78 | Temp 98.4°F | Ht 71.0 in | Wt 295.0 lb

## 2019-05-20 DIAGNOSIS — Z9189 Other specified personal risk factors, not elsewhere classified: Secondary | ICD-10-CM | POA: Diagnosis not present

## 2019-05-20 DIAGNOSIS — E559 Vitamin D deficiency, unspecified: Secondary | ICD-10-CM

## 2019-05-20 DIAGNOSIS — Z6841 Body Mass Index (BMI) 40.0 and over, adult: Secondary | ICD-10-CM

## 2019-05-20 DIAGNOSIS — E7849 Other hyperlipidemia: Secondary | ICD-10-CM

## 2019-05-20 NOTE — Progress Notes (Signed)
Office: [phone]  /  Fax: [phone]   HPI:   Chief Complaint: OBESITY Daniel Powers is here to discuss his progress with his obesity treatment plan. He is on the Category 4 plan and is following his eating plan approximately 75% of the time. He states he is exercising 0 minutes 0 times per week. [name] reports that he is enjoying the plan, but is sometimes bored with lunch. He does note that he skips lunch 2 times weekly. His weight is 295 lb (133.8 kg) today and has had a weight loss of 2 pounds over a period of 3 weeks since his last visit. He has lost 14 lbs since starting treatment with [name].  Vitamin D deficiency [name] has a diagnosis of Vitamin D deficiency. He is currently taking prescription Vit D and denies nausea, vomiting or muscle weakness.  Hyperlipidemia [name] has hyperlipidemia and has been trying to improve his cholesterol levels with intensive lifestyle modification including a low saturated fat diet, exercise and weight loss. He is on atorvastatin and denies any chest pain.  At risk for cardiovascular disease [name] is at a higher than average risk for cardiovascular disease due to obesity. He currently denies any chest pain.  ASSESSMENT AND PLAN:  Vitamin D deficiency  Other hyperlipidemia  At risk for heart disease  Class 3 severe obesity with serious comorbidity and body mass index (BMI) of 40.0 to 44.9 in adult, unspecified obesity type ([place])  PLAN:  Vitamin D Deficiency June was informed that low Vitamin D levels contributes to fatigue and are associated with obesity, breast, and colon cancer. He agrees to continue to take prescription Vit D @ 50,000 IU every week #4 with 0 refills and will follow-up for routine testing of Vitamin D, at least 2-3 times per year. He was informed of the risk of over-replacement of Vitamin D and agrees to not increase his dose unless he discusses this with [name] first. [name] agrees to follow-up with our clinic in 2  weeks.  Hyperlipidemia [name] was informed of the American Heart Association Guidelines emphasizing intensive lifestyle modifications as the first line treatment for hyperlipidemia. We discussed many lifestyle modifications today in depth, and [name] will continue to work on decreasing saturated fats such as fatty red meat, butter and many fried foods. [name] will continue his atorvastatin. He will also increase vegetables and lean protein in his diet and continue to work on exercise and weight loss efforts.  Cardiovascular risk counseling [name] was given extended (15 minutes) coronary artery disease prevention counseling today. He is 63 y.o. male and has risk factors for heart disease including obesity. We discussed intensive lifestyle modifications today with an emphasis on specific weight loss instructions and strategies. Pt was also informed of the importance of increasing exercise and decreasing saturated fats to help prevent heart disease.  Obesity [name] is currently in the action stage of change. As such, his goal is to continue with weight loss efforts. He has agreed to follow the Category 4 plan. [name] has been instructed to work up to a goal of 150 minutes of combined cardio and strengthening exercise per week for weight loss and overall health benefits. We discussed the following Behavioral Modification Strategies today: work on meal planning and easy cooking plans, and keeping healthy foods in the home.  [name] has agreed to follow-up with our clinic in 2 weeks. He was informed of the importance of frequent follow-up visits to maximize his success with intensive lifestyle modifications for his multiple health conditions.  ALLERGIES: Allergies  Allergen Reactions   Penicillins     REACTION: Penicillin    MEDICATIONS: Current Outpatient Medications on File Prior to Visit  Medication Sig Dispense Refill   albuterol (PROAIR HFA) 108 (90 Base) MCG/ACT inhaler Inhale 2 puffs  into the lungs every 6 (six) hours as needed for wheezing. 1 Inhaler 2   amLODipine (NORVASC) 5 MG tablet TAKE 1 TABLET(5 MG) BY MOUTH DAILY 90 tablet 1   atorvastatin (LIPITOR) 10 MG tablet TAKE 1 TABLET(10 MG) BY MOUTH DAILY AT 6 PM 90 tablet 0   cetirizine (ZYRTEC) 10 MG tablet Take 10 mg by mouth daily.     CINNAMON PO Take 1 tablet by mouth daily.     fish oil-omega-3 fatty acids 1000 MG capsule Take 1 g by mouth daily.     Flaxseed, Linseed, 1000 MG CAPS Take 1 capsule by mouth daily.     fluticasone (FLONASE) 50 MCG/ACT nasal spray Place 2 sprays into both nostrils daily. 16 g 6   olmesartan (BENICAR) 20 MG tablet TAKE 1 TABLET(20 MG) BY MOUTH DAILY 90 tablet 1   tadalafil (CIALIS) 20 MG tablet Take 20 mg by mouth daily as needed.     Vitamin D, Ergocalciferol, (DRISDOL) 1.25 MG (50000 UT) CAPS capsule Take 1 capsule (50,000 Units total) by mouth every 7 (seven) days. 4 capsule 0   warfarin (COUMADIN) 5 MG tablet TAKE AS DIRECTED BY COUMADIN CLINIC. 90 tablet 1   Current Facility-Administered Medications on File Prior to Visit  Medication Dose Route Frequency Provider Last Rate Last Dose   0.9 %  sodium chloride infusion  500 mL Intravenous Continuous Armbruster, [name], MD        PAST MEDICAL HISTORY: Past Medical History:  Diagnosis Date   Anxiety    Arthritis    Asthma    COPD (chronic obstructive pulmonary disease) (HCC)    DVT (deep venous thrombosis) (HCC)    Gout    High cholesterol    Hypertension    Influenza with pneumonia    Lactose intolerance    Obesity    Pre-diabetes    Psychosexual dysfunction with inhibited sexual excitement     PAST SURGICAL HISTORY: Past Surgical History:  Procedure Laterality Date   HAND SURGERY     right   TONSILLECTOMY AND ADENOIDECTOMY      SOCIAL HISTORY: Social History   Tobacco Use   Smoking status: Never Smoker   Smokeless tobacco: Never Used  Substance Use Topics   Alcohol use: Yes     Comment: ocass- once or twice a month   Drug use: No    FAMILY HISTORY: Family History  Problem Relation Age of Onset   Hypertension Mother    Alzheimer's disease Mother    Hypertension Father    Alcohol abuse Father    Obesity Father    Cancer Sister        "male organs"   Lung cancer Maternal Uncle    Esophageal cancer Maternal Uncle    Hypertension Maternal Grandmother    Alzheimer's disease Maternal Grandmother    Hypertension Maternal Grandfather    Hypertension Paternal Grandmother    Hypertension Paternal Grandfather    Heart disease Brother 6       MI   Alzheimer's disease Paternal Aunt    Colon cancer Neg Hx    ROS: Review of Systems  Cardiovascular: Negative for chest pain.  Gastrointestinal: Negative for nausea and vomiting.  Musculoskeletal:  Negative for muscle weakness.   PHYSICAL EXAM: Blood pressure 129/78, pulse 78, temperature 98.4 F (36.9 C), temperature source Oral, height 5\' 11"  (1.803 m), weight 295 lb (133.8 kg), SpO2 98 %. Body mass index is 41.14 kg/m. Physical Exam Vitals signs reviewed.  Constitutional:      Appearance: Normal appearance. He is obese.  Cardiovascular:     Rate and Rhythm: Normal rate.     Pulses: Normal pulses.  Pulmonary:     Effort: Pulmonary effort is normal.     Breath sounds: Normal breath sounds.  Musculoskeletal: Normal range of motion.  Skin:    General: Skin is warm and dry.  Neurological:     Mental Status: He is alert and oriented to person, place, and time.  Psychiatric:        Behavior: Behavior normal.   RECENT LABS AND TESTS: BMET    Component Value Date/Time   NA 139 12/25/2018 0000   K 4.3 12/25/2018 0000   CL 104 12/25/2018 0000   CO2 20 12/25/2018 0000   GLUCOSE 98 12/25/2018 0000   GLUCOSE 96 08/05/2018 0946   GLUCOSE 85 05/07/2006 1230   BUN 19 12/25/2018 0000   CREATININE 1.06 12/25/2018 0000   CREATININE 0.85 08/01/2013 1250   CALCIUM 9.1 12/25/2018  0000   GFRNONAA 74 12/25/2018 0000   GFRAA 85 12/25/2018 0000   Lab Results  Component Value Date   HGBA1C 5.7 (H) 12/25/2018   HGBA1C 6.1 08/05/2018   HGBA1C 6.2 02/01/2018   Lab Results  Component Value Date   INSULIN 9.5 12/25/2018   CBC    Component Value Date/Time   WBC 6.5 12/25/2018 0000   WBC 7.9 06/28/2017 1612   RBC 5.39 12/25/2018 0000   RBC 5.31 06/28/2017 1612   HGB 14.5 12/25/2018 0000   HGB 12.7 (L) 10/10/2010 0935   HCT 45.1 12/25/2018 0000   HCT 39.2 10/10/2010 0935   PLT 221.0 06/28/2017 1612   PLT 287 10/10/2010 0935   MCV 84 12/25/2018 0000   MCV 77 (L) 10/10/2010 0935   MCH 26.9 12/25/2018 0000   MCH 27.9 09/09/2016 1436   MCH 24.9 (L) 10/10/2010 0935   MCH 26.8 02/28/2010 2240   MCHC 32.2 12/25/2018 0000   MCHC 32.5 06/28/2017 1612   RDW 14.8 12/25/2018 0000   RDW 16.3 (H) 10/10/2010 0935   LYMPHSABS 1.8 12/25/2018 0000   LYMPHSABS 2.0 10/10/2010 0935   MONOABS 0.6 06/28/2017 1612   EOSABS 0.1 12/25/2018 0000   EOSABS 0.1 10/10/2010 0935   BASOSABS 0.0 12/25/2018 0000   BASOSABS 0.0 10/10/2010 0935   Iron/TIBC/Ferritin/ %Sat No results found for: IRON, TIBC, FERRITIN, IRONPCTSAT Lipid Panel     Component Value Date/Time   CHOL 122 12/25/2018 0000   TRIG 60 12/25/2018 0000   HDL 41 12/25/2018 0000   CHOLHDL 3 08/05/2018 0946   VLDL 12.4 08/05/2018 0946   LDLCALC 69 12/25/2018 0000   Hepatic Function Panel     Component Value Date/Time   PROT 6.4 12/25/2018 0000   ALBUMIN 4.0 12/25/2018 0000   AST 17 12/25/2018 0000   ALT 14 12/25/2018 0000   ALKPHOS 45 12/25/2018 0000   BILITOT 0.6 12/25/2018 0000   BILIDIR 0.1 11/26/2014 1049      Component Value Date/Time   TSH 0.920 12/25/2018 0000   TSH 1.38 06/28/2017 1612   TSH 1.04 06/29/2016 1643   Results for KEISEAN, SEABERG (MRN [id number]) as of 05/20/2019 11:23  Ref. Range  12/25/2018 00:00  Vitamin D, 25-Hydroxy Latest Ref Range: 30.0 - 100.0 ng/mL <4.0 (L)   OBESITY BEHAVIORAL  INTERVENTION VISIT  Today's visit was #10  Starting weight: 309 lbs Starting date: 12/25/2018 Today's weight: 295 lbs Today's date: 05/20/2019 Total lbs lost to date: 14    05/20/2019  Height 5\' 11"  (1.803 m)  Weight 295 lb (133.8 kg)  BMI (Calculated) 41.16  BLOOD PRESSURE - SYSTOLIC Q000111Q  BLOOD PRESSURE - DIASTOLIC 78   Body Fat % Q000111Q %  Total Body Water (lbs) 129.4 lbs   ASK: We discussed the diagnosis of obesity with [name] today and [name] agreed to give [name] permission to discuss obesity behavioral modification therapy today.  ASSESS: Daelynn has the diagnosis of obesity and his BMI today is 41.2. Aqib is in the action stage of change.   ADVISE: Leonidas was educated on the multiple health risks of obesity as well as the benefit of weight loss to improve his health. He was advised of the need for long term treatment and the importance of lifestyle modifications to improve his current health and to decrease his risk of future health problems.  AGREE: Multiple dietary modification options and treatment options were discussed and  Mackie agreed to follow the recommendations documented in the above note.  ARRANGE: Jhoel was educated on the importance of frequent visits to treat obesity as outlined per CMS and USPSTF guidelines and agreed to schedule his next follow up appointment today.  Migdalia Dk, am acting as transcriptionist for Abby Potash, PA-C I, Abby Potash, PA-C have reviewed above note and agree with its content

## 2019-05-22 IMAGING — DX DG CHEST 2V
2 series · 2 of 2 positions shown · non-contrast
Comparison: Chest x-ray dated February 05, 2012.

CLINICAL DATA: Chest pain with deep inspiration for the past 2
months.

EXAM:
CHEST - 2 VIEW

[chest pa]
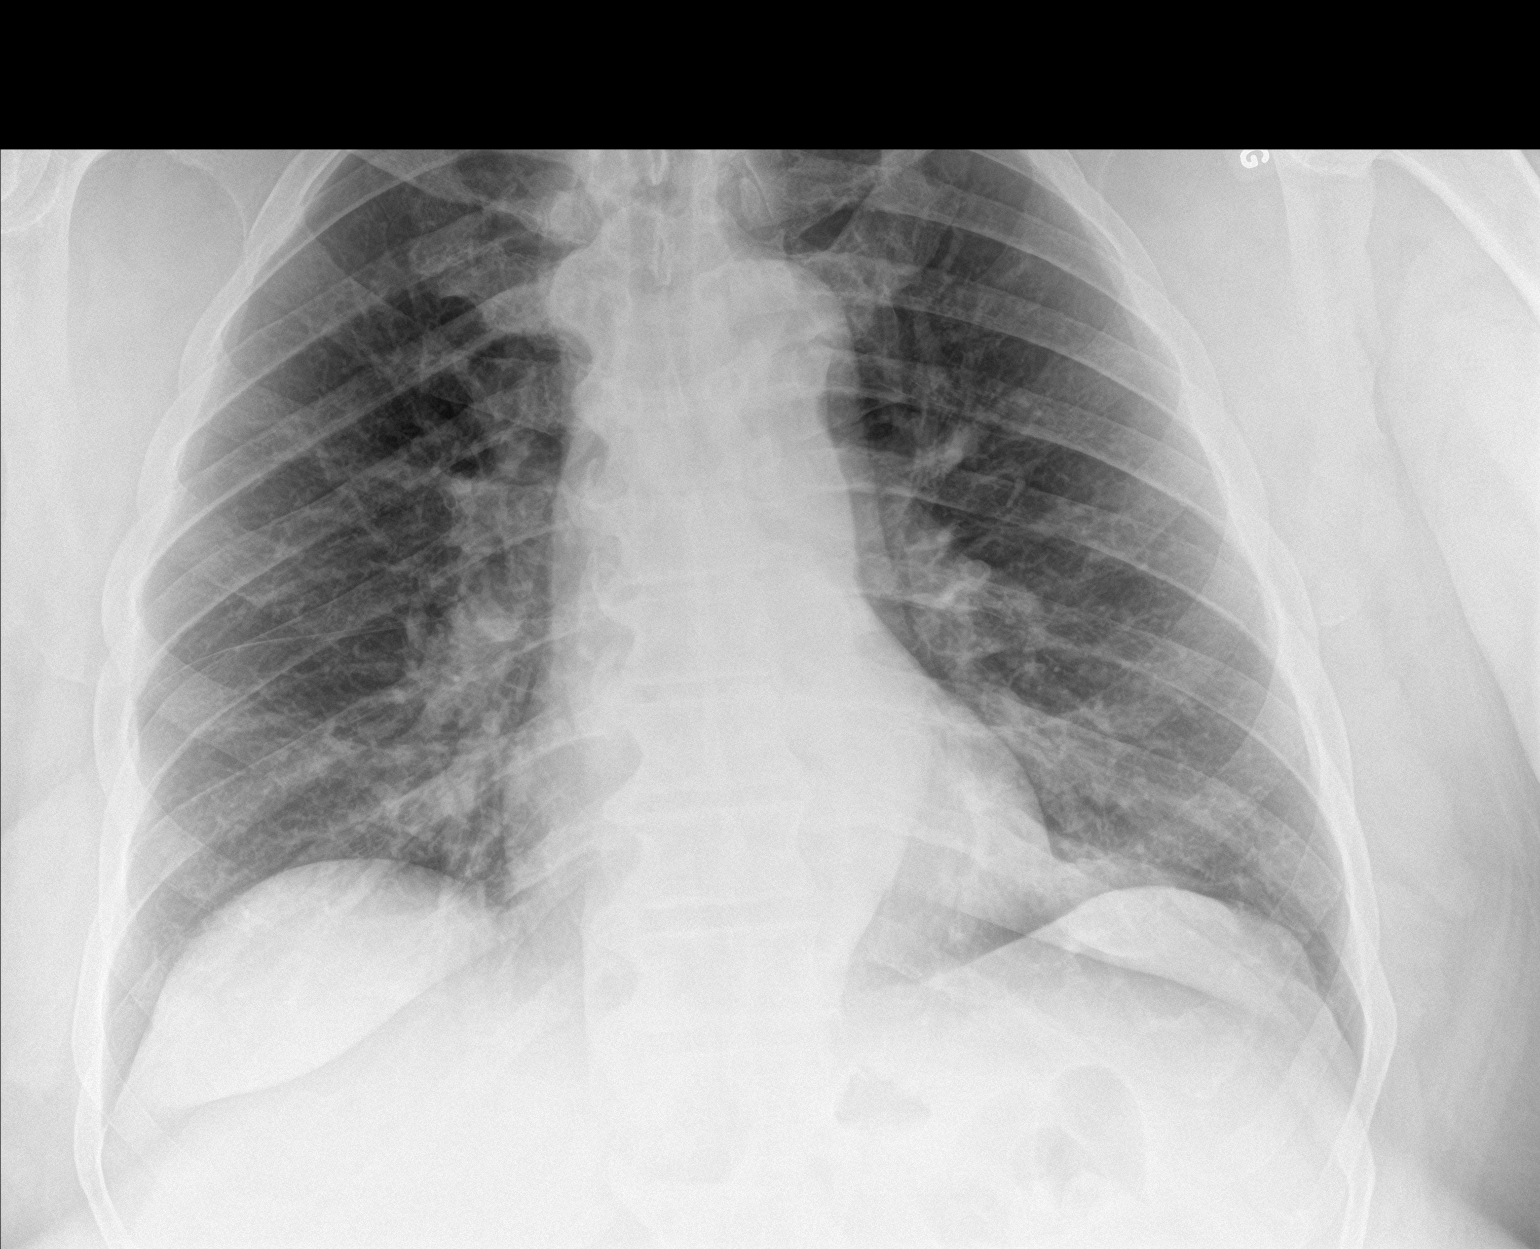

[chest lat]
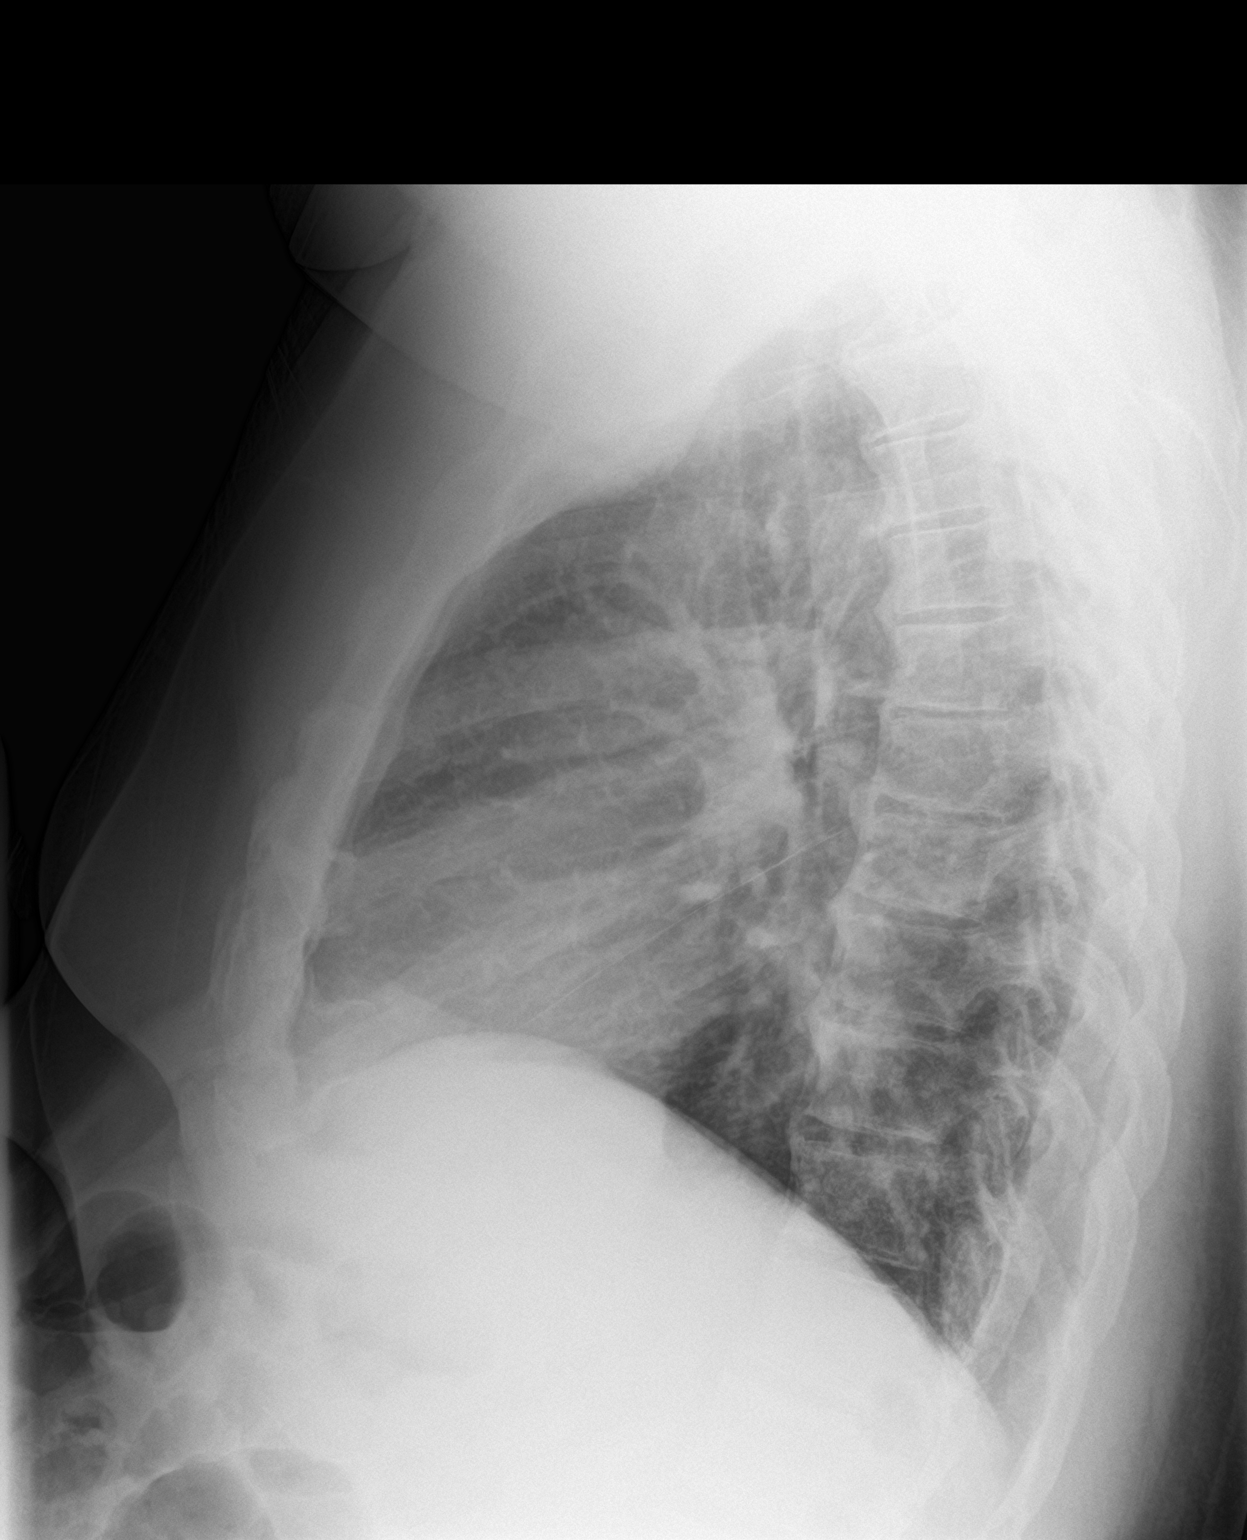

[2 of 2 positions shown; findings below may reference images not displayed]

FINDINGS: The heart size and mediastinal contours are within normal limits.
Normal pulmonary vascularity. No focal consolidation, pleural
effusion, or pneumothorax. No acute osseous abnormality.
IMPRESSION: No active cardiopulmonary disease.

## 2019-05-27 ENCOUNTER — Other Ambulatory Visit: Payer: Self-pay | Admitting: *Deleted

## 2019-05-27 ENCOUNTER — Telehealth: Payer: Self-pay | Admitting: *Deleted

## 2019-05-27 DIAGNOSIS — E785 Hyperlipidemia, unspecified: Secondary | ICD-10-CM

## 2019-05-27 MED ORDER — ATORVASTATIN CALCIUM 10 MG PO TABS: ORAL_TABLET | ORAL | 0 refills | Status: DC

## 2019-05-27 NOTE — Telephone Encounter (Signed)
Left message on machine to schedule patient for follow up.   He has nv scheduled on 06/05/19 at 9am, but he needs to follow up with Dr. Etter Sjogren.  As of right now I have a 9am opened with Dr. Etter Sjogren to recheck with her and she can do INR check at that visit.

## 2019-05-28 NOTE — Telephone Encounter (Signed)
Appointment made for follow up

## 2019-05-28 NOTE — Telephone Encounter (Signed)
Patient is returning sheketia call to make appt. Call back [phone]

## 2019-06-02 ENCOUNTER — Telehealth (INDEPENDENT_AMBULATORY_CARE_PROVIDER_SITE_OTHER): Payer: Self-pay

## 2019-06-05 ENCOUNTER — Other Ambulatory Visit: Payer: Self-pay

## 2019-06-05 ENCOUNTER — Ambulatory Visit: Payer: 59 | Admitting: Family Medicine

## 2019-06-05 ENCOUNTER — Encounter: Payer: Self-pay | Admitting: Family Medicine

## 2019-06-05 ENCOUNTER — Ambulatory Visit (INDEPENDENT_AMBULATORY_CARE_PROVIDER_SITE_OTHER): Payer: 59 | Admitting: Family Medicine

## 2019-06-05 ENCOUNTER — Ambulatory Visit: Payer: 59

## 2019-06-05 VITALS — BP 133/78 | HR 66 | Temp 97.4°F | Resp 16 | Ht 71.0 in | Wt 296.4 lb

## 2019-06-05 DIAGNOSIS — E119 Type 2 diabetes mellitus without complications: Secondary | ICD-10-CM | POA: Diagnosis not present

## 2019-06-05 DIAGNOSIS — E1165 Type 2 diabetes mellitus with hyperglycemia: Secondary | ICD-10-CM | POA: Insufficient documentation

## 2019-06-05 DIAGNOSIS — I10 Essential (primary) hypertension: Secondary | ICD-10-CM | POA: Diagnosis not present

## 2019-06-05 DIAGNOSIS — Z7901 Long term (current) use of anticoagulants: Secondary | ICD-10-CM | POA: Diagnosis not present

## 2019-06-05 DIAGNOSIS — I82401 Acute embolism and thrombosis of unspecified deep veins of right lower extremity: Secondary | ICD-10-CM

## 2019-06-05 DIAGNOSIS — E1169 Type 2 diabetes mellitus with other specified complication: Secondary | ICD-10-CM

## 2019-06-05 DIAGNOSIS — E785 Hyperlipidemia, unspecified: Secondary | ICD-10-CM | POA: Diagnosis not present

## 2019-06-05 LAB — HEMOGLOBIN A1C: Hgb A1c MFr Bld: 5.8 % (ref 4.6–6.5)

## 2019-06-05 LAB — LIPID PANEL
Cholesterol: 128 mg/dL (ref 0–200)
HDL: 38.4 mg/dL — ABNORMAL LOW (ref >39.00)
LDL Cholesterol: 78 mg/dL (ref 0–99)
NonHDL: 89.32
Total CHOL/HDL Ratio: 3
Triglycerides: 57 mg/dL (ref 0.0–149.0)
VLDL: 11.4 mg/dL (ref 0.0–40.0)

## 2019-06-05 LAB — MICROALBUMIN / CREATININE URINE RATIO
Creatinine,U: 144.9 mg/dL
Microalb Creat Ratio: 0.7 mg/g (ref 0.0–30.0)
Microalb, Ur: 1.1 mg/dL (ref 0.0–1.9)

## 2019-06-05 LAB — COMPREHENSIVE METABOLIC PANEL
ALT: 17 U/L (ref 0–53)
AST: 15 U/L (ref 0–37)
Albumin: 4 g/dL (ref 3.5–5.2)
Alkaline Phosphatase: 40 U/L (ref 39–117)
BUN: 17 mg/dL (ref 6–23)
CO2: 28 mEq/L (ref 19–32)
Calcium: 9.1 mg/dL (ref 8.4–10.5)
Chloride: 106 mEq/L (ref 96–112)
Creatinine, Ser: 0.94 mg/dL (ref 0.40–1.50)
GFR: 97.52 mL/min (ref >60.00)
Glucose, Bld: 89 mg/dL (ref 70–99)
Potassium: 4.3 mEq/L (ref 3.5–5.1)
Sodium: 140 mEq/L (ref 135–145)
Total Bilirubin: 0.7 mg/dL (ref 0.2–1.2)
Total Protein: 6.6 g/dL (ref 6.0–8.3)

## 2019-06-05 LAB — POCT INR: INR: 2.3 (ref 2.0–3.0)

## 2019-06-05 MED ORDER — BLOOD GLUCOSE MONITOR KIT: PACK | 0 refills | Status: DC

## 2019-06-05 NOTE — Assessment & Plan Note (Signed)
Cont coumadin--- pt still reluctant to start eliquis or xaralto

## 2019-06-05 NOTE — Assessment & Plan Note (Signed)
Well controlled, no changes to meds. Encouraged heart healthy diet such as the DASH diet and exercise as tolerated.  °

## 2019-06-05 NOTE — Assessment & Plan Note (Signed)
Check labs 

## 2019-06-05 NOTE — Assessment & Plan Note (Signed)
Tolerating statin, encouraged heart healthy diet, avoid trans fats, minimize simple carbs and saturated fats. Increase exercise as tolerated 

## 2019-06-05 NOTE — Progress Notes (Signed)
Patient ID: Daniel Powers, male    DOB: [birth year]  Age: 63 y.o. MRN: [id number]    Subjective:  Subjective  HPI PARMVIR BOOMER presents for f/u dm, chol and bp.  He also needs a coumadin check No complaints  HPI HYPERTENSION   Blood pressure range-not checking   Chest pain- no      Dyspnea- no Lightheadedness- no   Edema- no  Other side effects - no   Medication compliance: good Low salt diet- yes     DIABETES    Blood Sugar ranges-not checking   Polyuria- no New Visual problems- no  Hypoglycemic symptoms- no  Other side effects-no Medication compliance - good Last eye exam- annually    HYPERLIPIDEMIA  Medication compliance- good  RUQ pain- no  Muscle aches- no Other side effects-no     Review of Systems  Constitutional: Negative for appetite change, diaphoresis, fatigue and unexpected weight change.  Eyes: Negative for pain, redness and visual disturbance.  Respiratory: Negative for cough, chest tightness, shortness of breath and wheezing.   Cardiovascular: Negative for chest pain, palpitations and leg swelling.  Endocrine: Negative for cold intolerance, heat intolerance, polydipsia, polyphagia and polyuria.  Genitourinary: Negative for difficulty urinating, dysuria and frequency.  Neurological: Negative for dizziness, light-headedness, numbness and headaches.    History Past Medical History:  Diagnosis Date  . Anxiety   . Arthritis   . Asthma   . COPD (chronic obstructive pulmonary disease) ([place])   . DVT (deep venous thrombosis) ([place])   . Gout   . High cholesterol   . Hypertension   . Influenza with pneumonia   . Lactose intolerance   . Obesity   . Pre-diabetes   . Psychosexual dysfunction with inhibited sexual excitement     He has a past surgical history that includes Tonsillectomy and adenoidectomy and Hand surgery.   His family history includes Alcohol abuse in his father; Alzheimer's disease in his maternal grandmother, mother, and paternal  aunt; Cancer in his sister; Esophageal cancer in his maternal uncle; Heart disease (age of onset: 59) in his brother; Hypertension in his father, maternal grandfather, maternal grandmother, mother, paternal grandfather, and paternal grandmother; Lung cancer in his maternal uncle; Obesity in his father.He reports that he has never smoked. He has never used smokeless tobacco. He reports current alcohol use. He reports that he does not use drugs.  Current Outpatient Medications on File Prior to Visit  Medication Sig Dispense Refill  . albuterol (PROAIR HFA) 108 (90 Base) MCG/ACT inhaler Inhale 2 puffs into the lungs every 6 (six) hours as needed for wheezing. 1 Inhaler 2  . amLODipine (NORVASC) 5 MG tablet TAKE 1 TABLET(5 MG) BY MOUTH DAILY 90 tablet 1  . atorvastatin (LIPITOR) 10 MG tablet TAKE 1 TABLET(10 MG) BY MOUTH DAILY AT 6 PM 90 tablet 0  . cetirizine (ZYRTEC) 10 MG tablet Take 10 mg by mouth daily.    [name] CINNAMON PO Take 1 tablet by mouth daily.    . fish oil-omega-3 fatty acids 1000 MG capsule Take 1 g by mouth daily.    . Flaxseed, Linseed, 1000 MG CAPS Take 1 capsule by mouth daily.    . fluticasone (FLONASE) 50 MCG/ACT nasal spray Place 2 sprays into both nostrils daily. 16 g 6  . olmesartan (BENICAR) 20 MG tablet TAKE 1 TABLET(20 MG) BY MOUTH DAILY 90 tablet 1  . tadalafil (CIALIS) 20 MG tablet Take 20 mg by mouth daily as needed.    [name]  Vitamin D, Ergocalciferol, (DRISDOL) 1.25 MG (50000 UT) CAPS capsule Take 1 capsule (50,000 Units total) by mouth every 7 (seven) days. 4 capsule 0  . warfarin (COUMADIN) 5 MG tablet TAKE AS DIRECTED BY COUMADIN CLINIC. 90 tablet 1   Current Facility-Administered Medications on File Prior to Visit  Medication Dose Route Frequency Provider Last Rate Last Dose  . 0.9 %  sodium chloride infusion  500 mL Intravenous Continuous Armbruster, [name], MD         Objective:  Objective  Physical Exam Vitals signs and nursing note reviewed.  Constitutional:       General: He is sleeping.     Appearance: He is well-developed.  HENT:     Head: Normocephalic and atraumatic.  Eyes:     Pupils: Pupils are equal, round, and reactive to light.  Neck:     Musculoskeletal: Normal range of motion and neck supple.     Thyroid: No thyromegaly.  Cardiovascular:     Rate and Rhythm: Normal rate and regular rhythm.     Heart sounds: No murmur.  Pulmonary:     Effort: Pulmonary effort is normal. No respiratory distress.     Breath sounds: Normal breath sounds. No wheezing or rales.  Chest:     Chest wall: No tenderness.  Musculoskeletal:        General: No tenderness.  Skin:    General: Skin is warm and dry.  Neurological:     Mental Status: He is oriented to person, place, and time.  Psychiatric:        Behavior: Behavior normal.        Thought Content: Thought content normal.        Judgment: Judgment normal.    BP 133/78 (BP Location: Left Arm, Patient Position: Sitting, Cuff Size: Large)   Pulse 66   Temp (!) 97.4 F (36.3 C) (Temporal)   Resp 16   Ht 5' 11"  (1.803 m)   Wt 296 lb 6.4 oz (134.4 kg)   SpO2 100%   BMI 41.34 kg/m  Wt Readings from Last 3 Encounters:  06/05/19 296 lb 6.4 oz (134.4 kg)  05/20/19 295 lb (133.8 kg)  04/30/19 297 lb (134.7 kg)     Lab Results  Component Value Date   WBC 6.5 12/25/2018   HGB 14.5 12/25/2018   HCT 45.1 12/25/2018   PLT 221.0 06/28/2017   GLUCOSE 98 12/25/2018   CHOL 122 12/25/2018   TRIG 60 12/25/2018   HDL 41 12/25/2018   LDLCALC 69 12/25/2018   ALT 14 12/25/2018   AST 17 12/25/2018   NA 139 12/25/2018   K 4.3 12/25/2018   CL 104 12/25/2018   CREATININE 1.06 12/25/2018   BUN 19 12/25/2018   CO2 20 12/25/2018   TSH 0.920 12/25/2018   PSA 1.31 06/28/2017   INR 2.3 06/05/2019   HGBA1C 5.7 (H) 12/25/2018   MICROALBUR 1.3 10/22/2012   Lab Results  Component Value Date   INR 2.3 06/05/2019   INR 2.5 04/24/2019   INR 2.7 03/13/2019    Dg Chest 2 View  Result Date:  02/02/2018 CLINICAL DATA:  Chest pain with deep inspiration for the past 2 months. EXAM: CHEST - 2 VIEW COMPARISON:  Chest x-ray dated February 05, 2012. FINDINGS: The heart size and mediastinal contours are within normal limits. Normal pulmonary vascularity. No focal consolidation, pleural effusion, or pneumothorax. No acute osseous abnormality. IMPRESSION: No active cardiopulmonary disease. Electronically Signed   By: Titus Dubin  M.D.   On: 02/02/2018 09:39   Dg Thoracic Spine 2 View  Result Date: 02/02/2018 CLINICAL DATA:  Back pain with deep inspiration over the past 2 months. EXAM: THORACIC SPINE 2 VIEWS COMPARISON:  Chest x-ray dated February 05, 2012. FINDINGS: Twelve rib-bearing thoracic vertebral bodies. No acute fracture or subluxation. Vertebral body heights are preserved. Alignment is normal. Flowing anterior endplate osteophytes, consistent with DISH. Intervertebral disc spaces are maintained. IMPRESSION: 1.  No acute osseous abnormality. 2. Findings consistent with DISH. Electronically Signed   By: Titus Dubin M.D.   On: 02/02/2018 09:41     Assessment & Plan:  Plan  I am having Leanor Kail. Crafts start on blood glucose meter kit and supplies. I am also having him maintain his fish oil-omega-3 fatty acids, tadalafil, Flaxseed (Linseed), cetirizine, fluticasone, CINNAMON PO, albuterol, amLODipine, olmesartan, warfarin, Vitamin D (Ergocalciferol), and atorvastatin. We will continue to administer sodium chloride.  Meds ordered this encounter  Medications  . blood glucose meter kit and supplies KIT    Sig: Dispense based on patient and insurance preference. Use up to four times daily as directed. (FOR ICD-9 250.00, 250.01).    Dispense:  1 each    Refill:  0    Order Specific Question:   Number of strips    Answer:   100    Order Specific Question:   Number of lancets    Answer:   100    Problem List Items Addressed This Visit      Unprioritized   Diet-controlled diabetes mellitus  ([place])    Check labs      Relevant Medications   blood glucose meter kit and supplies KIT   Other Relevant Orders   Hemoglobin A1c   Microalbumin / creatinine urine ratio   Essential hypertension    Well controlled, no changes to meds. Encouraged heart healthy diet such as the DASH diet and exercise as tolerated.       Relevant Orders   Comprehensive metabolic panel   Microalbumin / creatinine urine ratio   Hyperlipidemia    Tolerating statin, encouraged heart healthy diet, avoid trans fats, minimize simple carbs and saturated fats. 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  Ann Held, DO

## 2019-06-05 NOTE — Patient Instructions (Signed)

## 2019-06-05 NOTE — Assessment & Plan Note (Signed)
con't healthy weight and wellness 

## 2019-06-05 NOTE — Assessment & Plan Note (Signed)
con't current dose coumadin

## 2019-06-10 ENCOUNTER — Ambulatory Visit (INDEPENDENT_AMBULATORY_CARE_PROVIDER_SITE_OTHER): Payer: 59 | Admitting: Family Medicine

## 2019-06-10 ENCOUNTER — Encounter (INDEPENDENT_AMBULATORY_CARE_PROVIDER_SITE_OTHER): Payer: Self-pay | Admitting: Family Medicine

## 2019-06-10 ENCOUNTER — Other Ambulatory Visit: Payer: Self-pay

## 2019-06-10 VITALS — BP 149/80 | HR 69 | Temp 98.2°F | Ht 71.0 in | Wt 294.0 lb

## 2019-06-10 DIAGNOSIS — Z9189 Other specified personal risk factors, not elsewhere classified: Secondary | ICD-10-CM | POA: Diagnosis not present

## 2019-06-10 DIAGNOSIS — Z6841 Body Mass Index (BMI) 40.0 and over, adult: Secondary | ICD-10-CM

## 2019-06-10 DIAGNOSIS — E7849 Other hyperlipidemia: Secondary | ICD-10-CM | POA: Diagnosis not present

## 2019-06-10 DIAGNOSIS — I152 Hypertension secondary to endocrine disorders: Secondary | ICD-10-CM

## 2019-06-10 DIAGNOSIS — E1159 Type 2 diabetes mellitus with other circulatory complications: Secondary | ICD-10-CM | POA: Diagnosis not present

## 2019-06-10 DIAGNOSIS — E119 Type 2 diabetes mellitus without complications: Secondary | ICD-10-CM

## 2019-06-10 DIAGNOSIS — E559 Vitamin D deficiency, unspecified: Secondary | ICD-10-CM | POA: Diagnosis not present

## 2019-06-10 DIAGNOSIS — I10 Essential (primary) hypertension: Secondary | ICD-10-CM

## 2019-06-10 MED ORDER — VITAMIN D (ERGOCALCIFEROL) 1.25 MG (50000 UNIT) PO CAPS: 50000.0000 [IU] | ORAL_CAPSULE | ORAL | 0 refills | Status: DC

## 2019-06-11 NOTE — Progress Notes (Signed)
Office: [phone]  /  Fax: [phone]   HPI:   Chief Complaint: OBESITY Daniel Powers is here to discuss his progress with his obesity treatment plan. He is on the Category 4 plan and is following his eating plan approximately 75 % of the time. He states he is exercising 0 minutes 0 times per week. Daniel Powers is doing well and is following the plan most of the time. He enjoyed 3 cookies yesterday. His weight is down 1 lb, but up 3 lbs in water so equals 4 lbs in fat loss.  His weight is 294 lb (133.4 kg) today and has had a weight loss of 1 pound over a period of 3 weeks since his last visit. He has lost 15 lbs since starting treatment with [name].  Vitamin D Deficiency Daniel Powers has a diagnosis of vitamin D deficiency. He is currently taking prescription Vit D 50,000 IU weekly. Last Vit D level was <4 on 12/25/2018. She denies nausea, vomiting or muscle weakness.  At risk for osteopenia and osteoporosis Daniel Powers is at higher risk of osteopenia and osteoporosis due to vitamin D deficiency.   Hyperlipidemia Daniel Powers has hyperlipidemia and has been trying to improve his cholesterol levels with intensive lifestyle modification including a low saturated fat diet, exercise and weight loss. He is on Lipitor and denies any chest pain, claudication or myalgias.  Diabetes II Daniel Powers has a diagnosis of diabetes type II. Daniel Powers's last A1c was 5.8. on 06/05/2019. He has been working on intensive lifestyle modifications including diet, exercise, and weight loss to help control his blood glucose levels.  Hypertension Daniel Powers is a 63 y.o. male with hypertension. Daniel Powers's blood pressure is 149/80 today. He is working on weight loss to help control his blood pressure with the goal of decreasing his risk of heart attack and stroke.   ASSESSMENT AND PLAN:  Vitamin D deficiency - Plan: Vitamin D, Ergocalciferol, (DRISDOL) 1.25 MG (50000 UT) CAPS capsule  Other hyperlipidemia  Type 2 diabetes mellitus without  complication, without long-term current use of insulin ([place])  Hypertension associated with type 2 diabetes mellitus ([place])  At risk for osteoporosis  Class 3 severe obesity with serious comorbidity and body mass index (BMI) of 40.0 to 44.9 in adult, unspecified obesity type ([place])  PLAN:  Vitamin D Deficiency Daniel Powers was informed that low vitamin D levels contributes to fatigue and are associated with obesity, breast, and colon cancer. Daniel Powers agrees to continue taking prescription Vit D 50,000 IU every week #4 and we will refill for 1 month. He will follow up for routine testing of vitamin D, at least 2-3 times per year. He was informed of the risk of over-replacement of vitamin D and agrees to not increase his dose unless he discusses this with [name] first. Daniel Powers agrees to follow up with our clinic in 2 weeks.  At risk for osteopenia and osteoporosis Daniel Powers was given extended (15 minutes) osteoporosis prevention counseling today. Daniel Powers is at risk for osteopenia and osteoporsis due to his vitamin D deficiency. He was encouraged to take his vitamin D and follow his higher calcium diet and increase strengthening exercise to help strengthen his bones and decrease his risk of osteopenia and osteoporosis.  Hyperlipidemia Daniel Powers was informed of the American Heart Association Guidelines emphasizing intensive lifestyle modifications as the first line treatment for hyperlipidemia. We discussed many lifestyle modifications today in depth, and Gillermo will continue to work on decreasing saturated fats such as fatty red meat, butter and many fried foods. He  will also increase vegetables and lean protein in his diet and continue to work on exercise and weight loss efforts. Benno agrees to continue taking Lipitor, and he agrees to follow up with our clinic in 2 weeks.  Diabetes II Daniel Powers has been given extensive diabetes education by myself today including ideal fasting and post-prandial blood glucose readings,  individual ideal Hgb A1c goals and hypoglycemia prevention. We discussed the importance of good blood sugar control to decrease the likelihood of diabetic complications such as nephropathy, neuropathy, limb loss, blindness, coronary artery disease, and death. We discussed the importance of intensive lifestyle modification including diet, exercise and weight loss as the first line treatment for diabetes. Daniel Powers agrees to continue his diet and will follow up at the agreed upon time.  Hypertension We discussed sodium restriction, working on healthy weight loss, and a regular exercise program as the means to achieve improved blood pressure control. Daniel Powers agreed with this plan and agreed to follow up as directed. We will continue to monitor his blood pressure as well as his progress with the above lifestyle modifications. Daniel Powers agrees to continue his medications, and he is to increase water. He will watch for signs of hypotension as he continues his lifestyle modifications. Daniel Powers agrees to follow up with our clinic in 2 weeks.  Obesity Daniel Powers is currently in the action stage of change. As such, his goal is to continue with weight loss efforts He has agreed to follow the Category 4 plan Daniel Powers has been instructed to work up to a goal of 150 minutes of combined cardio and strengthening exercise per week or as tolerated for weight loss and overall health benefits. We discussed the following Behavioral Modification Strategies today: increasing lean protein intake, decreasing simple carbohydrates, increasing vegetables, increase H20 intake, work on meal planning and easy cooking plans and holiday eating strategies    Daniel Powers has agreed to follow up with our clinic in 2 weeks. He was informed of the importance of frequent follow up visits to maximize his success with intensive lifestyle modifications for his multiple health conditions.  ALLERGIES: Allergies  Allergen Reactions  . Penicillins     REACTION:  Penicillin    MEDICATIONS: Current Outpatient Medications on File Prior to Visit  Medication Sig Dispense Refill  . albuterol (PROAIR HFA) 108 (90 Base) MCG/ACT inhaler Inhale 2 puffs into the lungs every 6 (six) hours as needed for wheezing. 1 Inhaler 2  . amLODipine (NORVASC) 5 MG tablet TAKE 1 TABLET(5 MG) BY MOUTH DAILY 90 tablet 1  . atorvastatin (LIPITOR) 10 MG tablet TAKE 1 TABLET(10 MG) BY MOUTH DAILY AT 6 PM 90 tablet 0  . blood glucose meter kit and supplies KIT Dispense based on patient and insurance preference. Use up to four times daily as directed. (FOR ICD-9 250.00, 250.01). 1 each 0  . cetirizine (ZYRTEC) 10 MG tablet Take 10 mg by mouth daily.    [name] CINNAMON PO Take 1 tablet by mouth daily.    . fish oil-omega-3 fatty acids 1000 MG capsule Take 1 g by mouth daily.    . Flaxseed, Linseed, 1000 MG CAPS Take 1 capsule by mouth daily.    . fluticasone (FLONASE) 50 MCG/ACT nasal spray Place 2 sprays into both nostrils daily. 16 g 6  . olmesartan (BENICAR) 20 MG tablet TAKE 1 TABLET(20 MG) BY MOUTH DAILY 90 tablet 1  . tadalafil (CIALIS) 20 MG tablet Take 20 mg by mouth daily as needed.    . warfarin (COUMADIN) 5  MG tablet TAKE AS DIRECTED BY COUMADIN CLINIC. 90 tablet 1   Current Facility-Administered Medications on File Prior to Visit  Medication Dose Route Frequency Provider Last Rate Last Dose  . 0.9 %  sodium chloride infusion  500 mL Intravenous Continuous Armbruster, [name], MD        PAST MEDICAL HISTORY: Past Medical History:  Diagnosis Date  . Anxiety   . Arthritis   . Asthma   . COPD (chronic obstructive pulmonary disease) ([place])   . DVT (deep venous thrombosis) ([place])   . Gout   . High cholesterol   . Hypertension   . Influenza with pneumonia   . Lactose intolerance   . Obesity   . Pre-diabetes   . Psychosexual dysfunction with inhibited sexual excitement     PAST SURGICAL HISTORY: Past Surgical History:  Procedure Laterality Date  . HAND SURGERY       right  . TONSILLECTOMY AND ADENOIDECTOMY      SOCIAL HISTORY: Social History   Tobacco Use  . Smoking status: Never Smoker  . Smokeless tobacco: Never Used  Substance Use Topics  . Alcohol use: Yes    Comment: ocass- once or twice a month  . Drug use: No    FAMILY HISTORY: Family History  Problem Relation Age of Onset  . Hypertension Mother   . Alzheimer's disease Mother   . Hypertension Father   . Alcohol abuse Father   . Obesity Father   . Cancer Sister        "male organs"  . Lung cancer Maternal Uncle   . Esophageal cancer Maternal Uncle   . Hypertension Maternal Grandmother   . Alzheimer's disease Maternal Grandmother   . Hypertension Maternal Grandfather   . Hypertension Paternal Grandmother   . Hypertension Paternal Grandfather   . Heart disease Brother 68       MI  . Alzheimer's disease Paternal Aunt   . Colon cancer Neg Hx     ROS: Review of Systems  Constitutional: Positive for weight loss.  Cardiovascular: Negative for chest pain and claudication.  Gastrointestinal: Negative for nausea and vomiting.  Musculoskeletal: Negative for myalgias.       Negative muscle weakness    PHYSICAL EXAM: Blood pressure (!) 149/80, pulse 69, temperature 98.2 F (36.8 C), temperature source Oral, height _0  (1.803 m), weight 294 lb (133.4 kg), SpO2 99 %. Body mass index is 41 kg/m. Physical Exam Vitals signs reviewed.  Constitutional:      Appearance: Normal appearance. He is obese.  Cardiovascular:     Rate and Rhythm: Normal rate.     Pulses: Normal pulses.  Pulmonary:     Effort: Pulmonary effort is normal.     Breath sounds: Normal breath sounds.  Musculoskeletal: Normal range of motion.  Skin:    General: Skin is warm and dry.  Neurological:     Mental Status: He is alert and oriented to person, place, and time.  Psychiatric:        Mood and Affect: Mood normal.        Behavior: Behavior normal.     RECENT LABS AND TESTS: BMET     Component Value Date/Time   NA 140 06/05/2019 0950   NA 139 12/25/2018 0000   K 4.3 06/05/2019 0950   CL 106 06/05/2019 0950   CO2 28 06/05/2019 0950   GLUCOSE 89 06/05/2019 0950   GLUCOSE 85 05/07/2006 1230   BUN 17 06/05/2019 0950   BUN 19  12/25/2018 0000   CREATININE 0.94 06/05/2019 0950   CREATININE 0.85 08/01/2013 1250   CALCIUM 9.1 06/05/2019 0950   GFRNONAA 74 12/25/2018 0000   GFRAA 85 12/25/2018 0000   Lab Results  Component Value Date   HGBA1C 5.8 06/05/2019   HGBA1C 5.7 (H) 12/25/2018   HGBA1C 6.1 08/05/2018   HGBA1C 6.2 02/01/2018   Lab Results  Component Value Date   INSULIN 9.5 12/25/2018   CBC    Component Value Date/Time   WBC 6.5 12/25/2018 0000   WBC 7.9 06/28/2017 1612   RBC 5.39 12/25/2018 0000   RBC 5.31 06/28/2017 1612   HGB 14.5 12/25/2018 0000   HGB 12.7 (L) 10/10/2010 0935   HCT 45.1 12/25/2018 0000   HCT 39.2 10/10/2010 0935   PLT 221.0 06/28/2017 1612   PLT 287 10/10/2010 0935   MCV 84 12/25/2018 0000   MCV 77 (L) 10/10/2010 0935   MCH 26.9 12/25/2018 0000   MCH 27.9 09/09/2016 1436   MCH 24.9 (L) 10/10/2010 0935   MCH 26.8 02/28/2010 2240   MCHC 32.2 12/25/2018 0000   MCHC 32.5 06/28/2017 1612   RDW 14.8 12/25/2018 0000   RDW 16.3 (H) 10/10/2010 0935   LYMPHSABS 1.8 12/25/2018 0000   LYMPHSABS 2.0 10/10/2010 0935   MONOABS 0.6 06/28/2017 1612   EOSABS 0.1 12/25/2018 0000   EOSABS 0.1 10/10/2010 0935   BASOSABS 0.0 12/25/2018 0000   BASOSABS 0.0 10/10/2010 0935   Iron/TIBC/Ferritin/ %Sat No results found for: IRON, TIBC, FERRITIN, IRONPCTSAT Lipid Panel     Component Value Date/Time   CHOL 128 06/05/2019 0950   CHOL 122 12/25/2018 0000   TRIG 57.0 06/05/2019 0950   HDL 38.40 (L) 06/05/2019 0950   HDL 41 12/25/2018 0000   CHOLHDL 3 06/05/2019 0950   VLDL 11.4 06/05/2019 0950   LDLCALC 78 06/05/2019 0950   LDLCALC 69 12/25/2018 0000   Hepatic Function Panel     Component Value Date/Time   PROT 6.6 06/05/2019 0950    PROT 6.4 12/25/2018 0000   ALBUMIN 4.0 06/05/2019 0950   ALBUMIN 4.0 12/25/2018 0000   AST 15 06/05/2019 0950   ALT 17 06/05/2019 0950   ALKPHOS 40 06/05/2019 0950   BILITOT 0.7 06/05/2019 0950   BILITOT 0.6 12/25/2018 0000   BILIDIR 0.1 11/26/2014 1049      Component Value Date/Time   TSH 0.920 12/25/2018 0000   TSH 1.38 06/28/2017 1612   TSH 1.04 06/29/2016 1643      OBESITY BEHAVIORAL INTERVENTION VISIT  Today's visit was # 11   Starting weight: 309 lbs Starting date: 12/25/2018 Today's weight : 294 lbs Today's date: 06/10/2019 Total lbs lost to date: 15    ASK: We discussed the diagnosis of obesity with [name] today and Daniel Powers agreed to give [name] permission to discuss obesity behavioral modification therapy today.  ASSESS: Jevonte has the diagnosis of obesity and his BMI today is 41.02 Travone is in the action stage of change   ADVISE: Sutton was educated on the multiple health risks of obesity as well as the benefit of weight loss to improve his health. He was advised of the need for long term treatment and the importance of lifestyle modifications to improve his current health and to decrease his risk of future health problems.  AGREE: Multiple dietary modification options and treatment options were discussed and  Constantine agreed to follow the recommendations documented in the above note.  ARRANGE: Gracin was educated on the importance  of frequent visits to treat obesity as outlined per CMS and USPSTF guidelines and agreed to schedule his next follow up appointment today.  [name], am acting as transcriptionist for Briscoe Deutscher, DO  I have reviewed the above documentation for accuracy and completeness, and I agree with the above. Briscoe Deutscher, DO

## 2019-06-16 ENCOUNTER — Encounter (INDEPENDENT_AMBULATORY_CARE_PROVIDER_SITE_OTHER): Payer: Self-pay | Admitting: Family Medicine

## 2019-06-18 ENCOUNTER — Encounter (INDEPENDENT_AMBULATORY_CARE_PROVIDER_SITE_OTHER): Payer: Self-pay

## 2019-06-24 ENCOUNTER — Other Ambulatory Visit: Payer: Self-pay | Admitting: *Deleted

## 2019-06-24 DIAGNOSIS — I10 Essential (primary) hypertension: Secondary | ICD-10-CM

## 2019-06-24 MED ORDER — OLMESARTAN MEDOXOMIL 20 MG PO TABS: ORAL_TABLET | ORAL | 1 refills | Status: DC

## 2019-06-24 MED ORDER — AMLODIPINE BESYLATE 5 MG PO TABS: ORAL_TABLET | ORAL | 1 refills | Status: DC

## 2019-06-24 NOTE — Telephone Encounter (Signed)
Received request from [facility] for olmesartan and amlodipine. Refill sent.

## 2019-07-01 ENCOUNTER — Encounter (INDEPENDENT_AMBULATORY_CARE_PROVIDER_SITE_OTHER): Payer: Self-pay | Admitting: Family Medicine

## 2019-07-01 ENCOUNTER — Other Ambulatory Visit: Payer: Self-pay

## 2019-07-01 ENCOUNTER — Ambulatory Visit (INDEPENDENT_AMBULATORY_CARE_PROVIDER_SITE_OTHER): Payer: 59 | Admitting: Family Medicine

## 2019-07-01 VITALS — BP 155/78 | HR 81 | Temp 98.0°F | Ht 71.0 in | Wt 293.0 lb

## 2019-07-01 DIAGNOSIS — E559 Vitamin D deficiency, unspecified: Secondary | ICD-10-CM

## 2019-07-01 DIAGNOSIS — Z9189 Other specified personal risk factors, not elsewhere classified: Secondary | ICD-10-CM

## 2019-07-01 DIAGNOSIS — I10 Essential (primary) hypertension: Secondary | ICD-10-CM

## 2019-07-01 DIAGNOSIS — Z6841 Body Mass Index (BMI) 40.0 and over, adult: Secondary | ICD-10-CM

## 2019-07-01 MED ORDER — VITAMIN D (ERGOCALCIFEROL) 1.25 MG (50000 UNIT) PO CAPS: 50000.0000 [IU] | ORAL_CAPSULE | ORAL | 0 refills | Status: DC

## 2019-07-01 MED ORDER — CHLORTHALIDONE 25 MG PO TABS: 12.5000 mg | ORAL_TABLET | Freq: Every day | ORAL | 0 refills | Status: DC

## 2019-07-02 NOTE — Progress Notes (Signed)
Office: [phone]  /  Fax: [phone]   HPI:  Chief Complaint: OBESITY Daniel Powers is here to discuss his progress with his obesity treatment plan. He is on the Category 4 plan and states he is following his eating plan approximately 75% of the time. He states he is exercising 0 minutes 0 times per week.  Daniel Powers continues to do well with weight loss even in December. He is working on eating all of his food, but struggles at times. He is a little concerned about Christmas temptations.  Today's visit was #12 Starting weight: 309 lbs Starting date: 12/25/2018 Today's weight: 293 lbs  Today's date: 07/01/2019 Total lbs lost to date: 16 Total lbs lost since last in-office visit: 1  Hypertension Daniel Powers has a diagnosis of hypertension and his blood pressure is elevated again on Norvasc and Benicar. He is working on diet and weight loss, but thinks it may be stress related.  At risk for cardiovascular disease Daniel Powers is at a higher than average risk for cardiovascular disease due to obesity. He currently denies any chest pain.  Vitamin D deficiency Daniel Powers has a diagnosis of Vitamin D deficiency. He is stable on prescription Vitamin D and requests a refill.  ASSESSMENT AND PLAN:  Essential hypertension - Plan: chlorthalidone (HYGROTON) 25 MG tablet  Vitamin D deficiency - Plan: Vitamin D, Ergocalciferol, (DRISDOL) 1.25 MG (50000 UT) CAPS capsule  At risk for heart disease  Class 3 severe obesity with serious comorbidity and body mass index (BMI) of 40.0 to 44.9 in adult, unspecified obesity type ([place])  PLAN:  Hypertension Daniel Powers is working on healthy weight loss and exercise to improve blood pressure control. He will start chlorthalidone 25 mg 1/2 tablet daily and agrees to follow-up with our clinic in 4 weeks. We will watch for signs of hypotension as he continues his lifestyle modifications.  Cardiovascular risk counseling Daniel Powers was given (~15 minutes) coronary artery disease  prevention counseling today. He is 63 y.o. male and has risk factors for heart disease including obesity. We discussed intensive lifestyle modifications today with an emphasis on specific weight loss instructions and strategies.   Vitamin D Deficiency Daniel Powers was informed that low Vitamin D levels contributes to fatigue and are associated with obesity, breast, and colon cancer. He agrees to continue to take prescription Vit D @ 50,000 IU every week #4 with 0 refills and will follow-up for routine testing of Vitamin D, at least 2-3 times per year. He was informed of the risk of over-replacement of Vitamin D and agrees to not increase his dose unless he discusses this with [name] first. Daniel Powers agrees to follow-up with our clinic in 4 weeks.  Obesity Daniel Powers is currently in the action stage of change. As such, his goal is to continue with weight loss efforts. He has agreed to follow the Category 4 plan. Daniel Powers will work up to a goal of 150 minutes of combined cardio and strengthening exercise per week for weight loss and overall health benefits. We discussed the following Behavioral Modification Strategies today: holiday eating strategies.   Daniel Powers has agreed to follow-up with our clinic in 4 weeks. He was informed of the importance of frequent follow-up visits to maximize his success with intensive lifestyle modifications for his multiple health conditions.  ALLERGIES: Allergies  Allergen Reactions  . Penicillins     REACTION: Penicillin    MEDICATIONS: Current Outpatient Medications on File Prior to Visit  Medication Sig Dispense Refill  . albuterol (PROAIR HFA) 108 (90 Base) MCG/ACT  inhaler Inhale 2 puffs into the lungs every 6 (six) hours as needed for wheezing. 1 Inhaler 2  . amLODipine (NORVASC) 5 MG tablet TAKE 1 TABLET(5 MG) BY MOUTH DAILY 90 tablet 1  . atorvastatin (LIPITOR) 10 MG tablet TAKE 1 TABLET(10 MG) BY MOUTH DAILY AT 6 PM 90 tablet 0  . blood glucose meter kit and supplies KIT  Dispense based on patient and insurance preference. Use up to four times daily as directed. (FOR ICD-9 250.00, 250.01). 1 each 0  . cetirizine (ZYRTEC) 10 MG tablet Take 10 mg by mouth daily.    [name] CINNAMON PO Take 1 tablet by mouth daily.    . fish oil-omega-3 fatty acids 1000 MG capsule Take 1 g by mouth daily.    . Flaxseed, Linseed, 1000 MG CAPS Take 1 capsule by mouth daily.    . fluticasone (FLONASE) 50 MCG/ACT nasal spray Place 2 sprays into both nostrils daily. 16 g 6  . olmesartan (BENICAR) 20 MG tablet TAKE 1 TABLET(20 MG) BY MOUTH DAILY 90 tablet 1  . tadalafil (CIALIS) 20 MG tablet Take 20 mg by mouth daily as needed.    . warfarin (COUMADIN) 5 MG tablet TAKE AS DIRECTED BY COUMADIN CLINIC. 90 tablet 1   Current Facility-Administered Medications on File Prior to Visit  Medication Dose Route Frequency Provider Last Rate Last Admin  . 0.9 %  sodium chloride infusion  500 mL Intravenous Continuous Armbruster, [name], MD        PAST MEDICAL HISTORY: Past Medical History:  Diagnosis Date  . Anxiety   . Arthritis   . Asthma   . COPD (chronic obstructive pulmonary disease) ([place])   . DVT (deep venous thrombosis) ([place])   . Gout   . High cholesterol   . Hypertension   . Influenza with pneumonia   . Lactose intolerance   . Obesity   . Pre-diabetes   . Psychosexual dysfunction with inhibited sexual excitement     PAST SURGICAL HISTORY: Past Surgical History:  Procedure Laterality Date  . HAND SURGERY     right  . TONSILLECTOMY AND ADENOIDECTOMY      SOCIAL HISTORY: Social History   Tobacco Use  . Smoking status: Never Smoker  . Smokeless tobacco: Never Used  Substance Use Topics  . Alcohol use: Yes    Comment: ocass- once or twice a month  . Drug use: No    FAMILY HISTORY: Family History  Problem Relation Age of Onset  . Hypertension Mother   . Alzheimer's disease Mother   . Hypertension Father   . Alcohol abuse Father   . Obesity Father   . Cancer Sister         "male organs"  . Lung cancer Maternal Uncle   . Esophageal cancer Maternal Uncle   . Hypertension Maternal Grandmother   . Alzheimer's disease Maternal Grandmother   . Hypertension Maternal Grandfather   . Hypertension Paternal Grandmother   . Hypertension Paternal Grandfather   . Heart disease Brother 72       MI  . Alzheimer's disease Paternal Aunt   . Colon cancer Neg Hx    ROS: Review of Systems  Constitutional: Positive for weight loss.   PHYSICAL EXAM: Blood pressure (!) 155/78, pulse 81, temperature 98 F (36.7 C), temperature source Oral, height 5' 11"  (1.803 m), weight 293 lb (132.9 kg), SpO2 92 %. Body mass index is 40.87 kg/m. Physical Exam Vitals reviewed.  Constitutional:      Appearance:  Normal appearance. He is obese.  Cardiovascular:     Rate and Rhythm: Normal rate.     Pulses: Normal pulses.  Pulmonary:     Effort: Pulmonary effort is normal.     Breath sounds: Normal breath sounds.  Musculoskeletal:        General: Normal range of motion.  Skin:    General: Skin is warm and dry.  Neurological:     Mental Status: He is alert and oriented to person, place, and time.  Psychiatric:        Behavior: Behavior normal.   RECENT LABS AND TESTS: BMET    Component Value Date/Time   NA 140 06/05/2019 0950   NA 139 12/25/2018 0000   K 4.3 06/05/2019 0950   CL 106 06/05/2019 0950   CO2 28 06/05/2019 0950   GLUCOSE 89 06/05/2019 0950   GLUCOSE 85 05/07/2006 1230   BUN 17 06/05/2019 0950   BUN 19 12/25/2018 0000   CREATININE 0.94 06/05/2019 0950   CREATININE 0.85 08/01/2013 1250   CALCIUM 9.1 06/05/2019 0950   GFRNONAA 74 12/25/2018 0000   GFRAA 85 12/25/2018 0000   Lab Results  Component Value Date   HGBA1C 5.8 06/05/2019   HGBA1C 5.7 (H) 12/25/2018   HGBA1C 6.1 08/05/2018   HGBA1C 6.2 02/01/2018   Lab Results  Component Value Date   INSULIN 9.5 12/25/2018   CBC    Component Value Date/Time   WBC 6.5 12/25/2018 0000   WBC 7.9  06/28/2017 1612   RBC 5.39 12/25/2018 0000   RBC 5.31 06/28/2017 1612   HGB 14.5 12/25/2018 0000   HGB 12.7 (L) 10/10/2010 0935   HCT 45.1 12/25/2018 0000   HCT 39.2 10/10/2010 0935   PLT 221.0 06/28/2017 1612   PLT 287 10/10/2010 0935   MCV 84 12/25/2018 0000   MCV 77 (L) 10/10/2010 0935   MCH 26.9 12/25/2018 0000   MCH 27.9 09/09/2016 1436   MCH 24.9 (L) 10/10/2010 0935   MCH 26.8 02/28/2010 2240   MCHC 32.2 12/25/2018 0000   MCHC 32.5 06/28/2017 1612   RDW 14.8 12/25/2018 0000   RDW 16.3 (H) 10/10/2010 0935   LYMPHSABS 1.8 12/25/2018 0000   LYMPHSABS 2.0 10/10/2010 0935   MONOABS 0.6 06/28/2017 1612   EOSABS 0.1 12/25/2018 0000   EOSABS 0.1 10/10/2010 0935   BASOSABS 0.0 12/25/2018 0000   BASOSABS 0.0 10/10/2010 0935   Iron/TIBC/Ferritin/ %Sat No results found for: IRON, TIBC, FERRITIN, IRONPCTSAT Lipid Panel     Component Value Date/Time   CHOL 128 06/05/2019 0950   CHOL 122 12/25/2018 0000   TRIG 57.0 06/05/2019 0950   HDL 38.40 (L) 06/05/2019 0950   HDL 41 12/25/2018 0000   CHOLHDL 3 06/05/2019 0950   VLDL 11.4 06/05/2019 0950   LDLCALC 78 06/05/2019 0950   LDLCALC 69 12/25/2018 0000   Hepatic Function Panel     Component Value Date/Time   PROT 6.6 06/05/2019 0950   PROT 6.4 12/25/2018 0000   ALBUMIN 4.0 06/05/2019 0950   ALBUMIN 4.0 12/25/2018 0000   AST 15 06/05/2019 0950   ALT 17 06/05/2019 0950   ALKPHOS 40 06/05/2019 0950   BILITOT 0.7 06/05/2019 0950   BILITOT 0.6 12/25/2018 0000   BILIDIR 0.1 11/26/2014 1049      Component Value Date/Time   TSH 0.920 12/25/2018 0000   TSH 1.38 06/28/2017 1612   TSH 1.04 06/29/2016 1643      I, Michaelene Song, am acting as Location manager for  Dennard Nip, MD I have reviewed the above documentation for accuracy and completeness, and I agree with the above. -Dennard Nip, MD

## 2019-07-03 ENCOUNTER — Other Ambulatory Visit: Payer: Self-pay

## 2019-07-03 ENCOUNTER — Ambulatory Visit (INDEPENDENT_AMBULATORY_CARE_PROVIDER_SITE_OTHER): Payer: 59

## 2019-07-03 DIAGNOSIS — Z7901 Long term (current) use of anticoagulants: Secondary | ICD-10-CM

## 2019-07-03 LAB — POCT INR: INR: 2.3 (ref 2.0–3.0)

## 2019-07-03 NOTE — Progress Notes (Signed)
Pt here for INR check per Dr. Etter Sjogren, 5 mg daily  Goal INR = 2.0-3.0  Last INR =2.3  Pt currently takes Coumadin   Pt denies recent antibiotics, no dietary changes and no unusual bruising / bleeding.  INR today = 2.3  Pt is to continue current dose and follow up in one month

## 2019-07-28 ENCOUNTER — Other Ambulatory Visit: Payer: Self-pay

## 2019-07-28 ENCOUNTER — Ambulatory Visit (INDEPENDENT_AMBULATORY_CARE_PROVIDER_SITE_OTHER): Payer: 59 | Admitting: Family Medicine

## 2019-07-28 ENCOUNTER — Encounter (INDEPENDENT_AMBULATORY_CARE_PROVIDER_SITE_OTHER): Payer: Self-pay | Admitting: Family Medicine

## 2019-07-28 VITALS — BP 134/76 | HR 65 | Temp 98.0°F | Ht 71.0 in | Wt 295.0 lb

## 2019-07-28 DIAGNOSIS — Z9189 Other specified personal risk factors, not elsewhere classified: Secondary | ICD-10-CM | POA: Diagnosis not present

## 2019-07-28 DIAGNOSIS — Z6841 Body Mass Index (BMI) 40.0 and over, adult: Secondary | ICD-10-CM | POA: Diagnosis not present

## 2019-07-28 DIAGNOSIS — E559 Vitamin D deficiency, unspecified: Secondary | ICD-10-CM | POA: Diagnosis not present

## 2019-07-28 MED ORDER — VITAMIN D (ERGOCALCIFEROL) 1.25 MG (50000 UNIT) PO CAPS: 50000.0000 [IU] | ORAL_CAPSULE | ORAL | 0 refills | Status: DC

## 2019-07-30 NOTE — Progress Notes (Signed)
Chief Complaint:   OBESITY Daniel Powers is here to discuss his progress with his obesity treatment plan along with follow-up of his obesity related diagnoses. Carson is on the Category 4 Plan and states he is following his eating plan approximately 50% of the time. Talmage states he is doing 0 minutes 0 times per week.  Today's visit was #: 31 Starting weight: 309 lbs Starting date: 12/25/2018 Today's weight: 295 lbs Today's date: 07/28/2019 Total lbs lost to date: 14 Total lbs lost since last in-office visit: 0  Interim History: Daniel Powers did some celebration eating over the holidays, but he states he is ready to get back on track. He struggles to eat all of his protein and he feels his hunger is mostly controlled.  Subjective:   1. Vitamin D deficiency Daniel Powers is stable on Vit D.  2. At increased risk of exposure to COVID-19 virus Daniel Powers is now at high risk of COVID19 infection due to high rates of COVID19 in the state. He is considering the COVID19 vaccine, but he is still not certain.  Assessment/Plan:   1. Vitamin D deficiency Low Vitamin D level contributes to fatigue and are associated with obesity, breast, and colon cancer. We will refill prescription Vit D for 1 month. She will follow-up for routine testing of vitamin D, at least 2-3 times per year to avoid over-replacement. We will continue to monitor.  - Vitamin D, Ergocalciferol, (DRISDOL) 1.25 MG (50000 UNIT) CAPS capsule; Take 1 capsule (50,000 Units total) by mouth every 7 (seven) days.  Dispense: 4 capsule; Refill: 0  2. At increased risk of exposure to COVID-19 virus Daniel Powers was educated on the vaccine risks versus benefits, and I gave the patient the website to find out what group he is in for future COVID19 vaccine.  3. Class 3 severe obesity with serious comorbidity and body mass index (BMI) of 40.0 to 44.9 in adult, unspecified obesity type [facility]) Daniel Powers is currently in the action stage of change. As such, his  goal is to continue with weight loss efforts. He has agreed to on the Category 4 Plan, lean meat equivalents were discussed today.    Exercise goals: For substantial health benefits, adults should do at least 150 minutes (2 hours and 30 minutes) a week of moderate-intensity, or 75 minutes (1 hour and 15 minutes) a week of vigorous-intensity aerobic physical activity, or an equivalent combination of moderate- and vigorous-intensity aerobic activity. Aerobic activity should be performed in episodes of at least 10 minutes, and preferably, it should be spread throughout the week. Adults should also include muscle-strengthening activities that involve all major muscle groups on 2 or more days a week.  We discussed the following behavioral modification strategies today: increasing lean protein intake and decreasing simple carbohydrates.  Daniel Powers has agreed to follow-up with our clinic in 4 weeks. He was informed of the importance of frequent follow-up visits to maximize his success with intensive lifestyle modifications for his multiple health conditions.   Objective:   Blood pressure 134/76, pulse 65, temperature 98 F (36.7 C), temperature source Oral, height 5\' 11"  (1.803 m), weight 295 lb (133.8 kg), SpO2 99 %. Body mass index is 41.14 kg/m.  General: Cooperative, alert, well developed, in no acute distress. HEENT: Conjunctivae and lids unremarkable. Neck: No thyromegaly.  Cardiovascular: Regular rhythm.  Lungs: Normal work of breathing. Extremities: No edema.  Neurologic: No focal deficits.   Lab Results  Component Value Date   CREATININE 0.94  06/05/2019   BUN 17 06/05/2019   NA 140 06/05/2019   K 4.3 06/05/2019   CL 106 06/05/2019   CO2 28 06/05/2019   Lab Results  Component Value Date   ALT 17 06/05/2019   AST 15 06/05/2019   ALKPHOS 40 06/05/2019   BILITOT 0.7 06/05/2019   Lab Results  Component Value Date   HGBA1C 5.8 06/05/2019   HGBA1C 5.7 (H) 12/25/2018   HGBA1C  6.1 08/05/2018   HGBA1C 6.2 02/01/2018   Lab Results  Component Value Date   INSULIN 9.5 12/25/2018   Lab Results  Component Value Date   TSH 0.920 12/25/2018   Lab Results  Component Value Date   CHOL 128 06/05/2019   HDL 38.40 (L) 06/05/2019   LDLCALC 78 06/05/2019   TRIG 57.0 06/05/2019   CHOLHDL 3 06/05/2019   Lab Results  Component Value Date   WBC 6.5 12/25/2018   HGB 14.5 12/25/2018   HCT 45.1 12/25/2018   MCV 84 12/25/2018   PLT 221.0 06/28/2017   No results found for: IRON, TIBC, FERRITIN  Attestation Statements:   Reviewed by clinician on day of visit: allergies, medications, problem list, medical history, surgical history, family history, social history, and previous encounter notes.   I, Trixie Dredge, am acting as transcriptionist for Dennard Nip, MD.  I have reviewed the above documentation for accuracy and completeness, and I agree with the above. -  Dennard Nip, MD

## 2019-08-05 ENCOUNTER — Other Ambulatory Visit: Payer: Self-pay

## 2019-08-05 ENCOUNTER — Ambulatory Visit (INDEPENDENT_AMBULATORY_CARE_PROVIDER_SITE_OTHER): Payer: 59

## 2019-08-05 DIAGNOSIS — Z7901 Long term (current) use of anticoagulants: Secondary | ICD-10-CM | POA: Diagnosis not present

## 2019-08-05 LAB — POCT INR: INR: 2.2 (ref 2.0–3.0)

## 2019-08-05 NOTE — Progress Notes (Signed)
Noted  Makhia Vosler R Lowne Chase, DO  

## 2019-08-05 NOTE — Progress Notes (Signed)
Pt here for INR check per Dr. Etter Sjogren  Goal INR =2.0-3.0  Last INR =  Pt currently takes Coumadin 5 mg daily   Pt denies recent antibiotics, no dietary changes and no unusual bruising / bleeding.  INR today = 2.2  Pt advised per Dr. Etter Sjogren  Continue current regimen.   Patient also brought his bp readings and would like for you to look at them. As he is seeing Dr. Leafy Ro they suggested him to take losartan but he has not started yet-would like to know if he actually needs it. I have placed his readings in folder to look at.

## 2019-08-06 NOTE — Progress Notes (Addendum)
Please advise on bp readings and medication noted in chart    Pt is already on benicar so should not take losartan too.     Ann Held, DO

## 2019-08-24 ENCOUNTER — Other Ambulatory Visit (INDEPENDENT_AMBULATORY_CARE_PROVIDER_SITE_OTHER): Payer: Self-pay | Admitting: Family Medicine

## 2019-08-24 DIAGNOSIS — E559 Vitamin D deficiency, unspecified: Secondary | ICD-10-CM

## 2019-08-25 NOTE — Telephone Encounter (Signed)
Please review

## 2019-08-26 NOTE — Telephone Encounter (Signed)
Please review

## 2019-08-28 ENCOUNTER — Other Ambulatory Visit: Payer: Self-pay | Admitting: *Deleted

## 2019-08-28 DIAGNOSIS — I82401 Acute embolism and thrombosis of unspecified deep veins of right lower extremity: Secondary | ICD-10-CM

## 2019-08-28 DIAGNOSIS — E785 Hyperlipidemia, unspecified: Secondary | ICD-10-CM

## 2019-08-28 MED ORDER — ATORVASTATIN CALCIUM 10 MG PO TABS: ORAL_TABLET | ORAL | 1 refills | Status: DC

## 2019-08-28 MED ORDER — WARFARIN SODIUM 5 MG PO TABS: ORAL_TABLET | ORAL | 1 refills | Status: DC

## 2019-09-02 ENCOUNTER — Other Ambulatory Visit: Payer: Self-pay

## 2019-09-02 ENCOUNTER — Ambulatory Visit (INDEPENDENT_AMBULATORY_CARE_PROVIDER_SITE_OTHER): Payer: Medicare HMO | Admitting: Family Medicine

## 2019-09-02 ENCOUNTER — Encounter (INDEPENDENT_AMBULATORY_CARE_PROVIDER_SITE_OTHER): Payer: Self-pay | Admitting: Family Medicine

## 2019-09-02 VITALS — BP 146/79 | HR 68 | Temp 98.1°F | Ht 71.0 in | Wt 295.0 lb

## 2019-09-02 DIAGNOSIS — Z6841 Body Mass Index (BMI) 40.0 and over, adult: Secondary | ICD-10-CM | POA: Diagnosis not present

## 2019-09-02 DIAGNOSIS — E559 Vitamin D deficiency, unspecified: Secondary | ICD-10-CM

## 2019-09-02 DIAGNOSIS — I10 Essential (primary) hypertension: Secondary | ICD-10-CM | POA: Diagnosis not present

## 2019-09-03 NOTE — Progress Notes (Signed)
Chief Complaint:   OBESITY Daniel Powers is here to discuss his progress with his obesity treatment plan along with follow-up of his obesity related diagnoses. Daniel Powers is on the Category 4 Plan and states he is following his eating plan approximately 60% of the time. Daniel Powers states he is bike riding 30 minutes 5 times per week.  Today's visit was #: 14 Starting weight: 309 lbs Starting date: 12/25/2018 Today's weight: 295 lbs Today's date: 09/02/2019 Total lbs lost to date: 14 Total lbs lost since last in-office visit: 0  Interim History: Daniel Powers has done well with maintaining his weight. He did some celebration eating and had gained, but he has gotten back on track with his eating plan. He is bored with his lunch options and would like more choices.  Subjective:   1. Essential hypertension Daniel Powers is on Norvasc and Benicar. We started him on chlorthalidone, but he was under the impression we had recommended losartan, which I do not have a record of in my notes or in medication orders.  2. Vitamin D deficiency Daniel Powers is stable on Vit D weekly prescription, and he denies nausea or vomiting.  Assessment/Plan:   1. Essential hypertension Daniel Powers is to discuss starting chlorthalidone with Dr. Carollee Herter. He will continue with diet, and weight loss in the meanwhile to improve blood pressure control. We will watch for signs of hypotension as he continues his lifestyle modifications.   2. Vitamin D deficiency Low Vitamin D level contributes to fatigue and are associated with obesity, breast, and colon cancer. Daniel Powers agreed to continue taking prescription Vitamin D 50,000 IU every week and will follow-up for routine testing of Vitamin D, at least 2-3 times per year to avoid over-replacement. We will recheck labs in 1 month.  3. Class 3 severe obesity with serious comorbidity and body mass index (BMI) of 40.0 to 44.9 in adult, unspecified obesity type [facility]) Daniel Powers is currently in the action stage  of change. As such, his goal is to continue with weight loss efforts. He has agreed to the Category 4 Plan with lunch options.   Exercise goals: Daniel Powers is to continue his current exercise regimen as is.  Behavioral modification strategies: meal planning and cooking strategies.  Daniel Powers has agreed to follow-up with our clinic in 3 weeks with Dr. Adair Patter. He was informed of the importance of frequent follow-up visits to maximize his success with intensive lifestyle modifications for his multiple health conditions.   Objective:   Blood pressure (!) 146/79, pulse 68, temperature 98.1 F (36.7 C), temperature source Oral, height 5\' 11"  (1.803 m), weight 295 lb (133.8 kg), SpO2 99 %. Body mass index is 41.14 kg/m.  General: Cooperative, alert, well developed, in no acute distress. HEENT: Conjunctivae and lids unremarkable. Cardiovascular: Regular rhythm.  Lungs: Normal work of breathing. Neurologic: No focal deficits.   Lab Results  Component Value Date   CREATININE 0.94 06/05/2019   BUN 17 06/05/2019   NA 140 06/05/2019   K 4.3 06/05/2019   CL 106 06/05/2019   CO2 28 06/05/2019   Lab Results  Component Value Date   ALT 17 06/05/2019   AST 15 06/05/2019   ALKPHOS 40 06/05/2019   BILITOT 0.7 06/05/2019   Lab Results  Component Value Date   HGBA1C 5.8 06/05/2019   HGBA1C 5.7 (H) 12/25/2018   HGBA1C 6.1 08/05/2018   HGBA1C 6.2 02/01/2018   Lab Results  Component Value Date   INSULIN 9.5 12/25/2018   Lab Results  Component Value  Date   TSH 0.920 12/25/2018   Lab Results  Component Value Date   CHOL 128 06/05/2019   HDL 38.40 (L) 06/05/2019   LDLCALC 78 06/05/2019   TRIG 57.0 06/05/2019   CHOLHDL 3 06/05/2019   Lab Results  Component Value Date   WBC 6.5 12/25/2018   HGB 14.5 12/25/2018   HCT 45.1 12/25/2018   MCV 84 12/25/2018   PLT 221.0 06/28/2017   No results found for: IRON, TIBC, FERRITIN  Attestation Statements:   Reviewed by clinician on day of  visit: allergies, medications, problem list, medical history, surgical history, family history, social history, and previous encounter notes.  Time spent on visit including pre-visit chart review and post-visit care was 31 minutes.    I, Trixie Dredge, am acting as transcriptionist for Dennard Nip, MD.  I have reviewed the above documentation for accuracy and completeness, and I agree with the above. -  Dennard Nip, MD

## 2019-09-07 ENCOUNTER — Ambulatory Visit: Payer: Medicare HMO | Attending: Internal Medicine

## 2019-09-07 DIAGNOSIS — Z23 Encounter for immunization: Secondary | ICD-10-CM | POA: Insufficient documentation

## 2019-09-07 NOTE — Progress Notes (Signed)
   Covid-19 Vaccination Clinic  Name:  Daniel Powers    MRN: [id number] DOB: 02-Apr-1956  09/07/2019  [name] was observed post Covid-19 immunization for 15 minutes without incidence. He was provided with Vaccine Information Sheet and instruction to access the V-Safe system.   [name] was instructed to call 911 with any severe reactions post vaccine: [name] Difficulty breathing  . Swelling of your face and throat  . A fast heartbeat  . A bad rash all over your body  . Dizziness and weakness    Immunizations Administered    Name Date Dose VIS Date Route   Pfizer COVID-19 Vaccine 09/07/2019 10:51 AM 0.3 mL 06/27/2019 Intramuscular   Manufacturer: [place]   Lot: [id number]   [place]: [id number]

## 2019-09-08 DIAGNOSIS — E113293 Type 2 diabetes mellitus with mild nonproliferative diabetic retinopathy without macular edema, bilateral: Secondary | ICD-10-CM | POA: Diagnosis not present

## 2019-09-08 DIAGNOSIS — H2513 Age-related nuclear cataract, bilateral: Secondary | ICD-10-CM | POA: Diagnosis not present

## 2019-09-09 ENCOUNTER — Ambulatory Visit (INDEPENDENT_AMBULATORY_CARE_PROVIDER_SITE_OTHER): Payer: Medicare HMO

## 2019-09-09 ENCOUNTER — Other Ambulatory Visit: Payer: Self-pay

## 2019-09-09 DIAGNOSIS — Z7901 Long term (current) use of anticoagulants: Secondary | ICD-10-CM

## 2019-09-09 LAB — POCT INR: INR: 1.6 — AB (ref 2.0–3.0)

## 2019-09-09 NOTE — Progress Notes (Signed)
Pt here for INR check per Dr. Etter Sjogren  Goal INR =2.0-3.0  Last INR = 2.2  Pt currently takes Coumadin 5 mg daily  INR today = 1.6  Patient took first covid vaccine on Sunday morning, No missed doses. No recent antibiotics.   Pt advised per Dr. Etter Sjogren take extra half today then return to current regimen after today. Advised to follow up in one week. I scheduled appointment with PCP as patient wishes to discuss BP as well.

## 2019-09-15 ENCOUNTER — Other Ambulatory Visit: Payer: Self-pay

## 2019-09-16 ENCOUNTER — Encounter: Payer: Self-pay | Admitting: Family Medicine

## 2019-09-16 ENCOUNTER — Other Ambulatory Visit: Payer: Self-pay

## 2019-09-16 ENCOUNTER — Ambulatory Visit (INDEPENDENT_AMBULATORY_CARE_PROVIDER_SITE_OTHER): Payer: Medicare HMO | Admitting: Family Medicine

## 2019-09-16 VITALS — BP 148/80 | HR 78 | Temp 97.7°F | Resp 18 | Ht 71.0 in | Wt 302.0 lb

## 2019-09-16 DIAGNOSIS — E559 Vitamin D deficiency, unspecified: Secondary | ICD-10-CM | POA: Diagnosis not present

## 2019-09-16 DIAGNOSIS — E119 Type 2 diabetes mellitus without complications: Secondary | ICD-10-CM | POA: Diagnosis not present

## 2019-09-16 DIAGNOSIS — J449 Chronic obstructive pulmonary disease, unspecified: Secondary | ICD-10-CM | POA: Diagnosis not present

## 2019-09-16 DIAGNOSIS — Z7901 Long term (current) use of anticoagulants: Secondary | ICD-10-CM

## 2019-09-16 DIAGNOSIS — E785 Hyperlipidemia, unspecified: Secondary | ICD-10-CM | POA: Diagnosis not present

## 2019-09-16 DIAGNOSIS — I82401 Acute embolism and thrombosis of unspecified deep veins of right lower extremity: Secondary | ICD-10-CM

## 2019-09-16 DIAGNOSIS — I10 Essential (primary) hypertension: Secondary | ICD-10-CM | POA: Diagnosis not present

## 2019-09-16 DIAGNOSIS — R739 Hyperglycemia, unspecified: Secondary | ICD-10-CM

## 2019-09-16 LAB — COMPREHENSIVE METABOLIC PANEL
ALT: 14 U/L (ref 0–53)
AST: 12 U/L (ref 0–37)
Albumin: 3.8 g/dL (ref 3.5–5.2)
Alkaline Phosphatase: 41 U/L (ref 39–117)
BUN: 19 mg/dL (ref 6–23)
CO2: 30 mEq/L (ref 19–32)
Calcium: 9.1 mg/dL (ref 8.4–10.5)
Chloride: 104 mEq/L (ref 96–112)
Creatinine, Ser: 1.01 mg/dL (ref 0.40–1.50)
GFR: 89.68 mL/min (ref >60.00)
Glucose, Bld: 75 mg/dL (ref 70–99)
Potassium: 4 mEq/L (ref 3.5–5.1)
Sodium: 139 mEq/L (ref 135–145)
Total Bilirubin: 0.6 mg/dL (ref 0.2–1.2)
Total Protein: 6.6 g/dL (ref 6.0–8.3)

## 2019-09-16 LAB — HEMOGLOBIN A1C: Hgb A1c MFr Bld: 5.6 % (ref 4.6–6.5)

## 2019-09-16 LAB — LIPID PANEL
Cholesterol: 116 mg/dL (ref 0–200)
HDL: 38.8 mg/dL — ABNORMAL LOW (ref >39.00)
LDL Cholesterol: 63 mg/dL (ref 0–99)
NonHDL: 77.49
Total CHOL/HDL Ratio: 3
Triglycerides: 71 mg/dL (ref 0.0–149.0)
VLDL: 14.2 mg/dL (ref 0.0–40.0)

## 2019-09-16 LAB — POCT INR: INR: 1.8 — AB (ref 2.0–3.0)

## 2019-09-16 LAB — VITAMIN D 25 HYDROXY (VIT D DEFICIENCY, FRACTURES): VITD: 35.33 ng/mL (ref 30.00–100.00)

## 2019-09-16 MED ORDER — VITAMIN D (ERGOCALCIFEROL) 1.25 MG (50000 UNIT) PO CAPS: 50000.0000 [IU] | ORAL_CAPSULE | ORAL | 0 refills | Status: DC

## 2019-09-16 MED ORDER — APIXABAN 5 MG PO TABS: 5.0000 mg | ORAL_TABLET | Freq: Two times a day (BID) | ORAL | 5 refills | Status: DC

## 2019-09-16 MED ORDER — OLMESARTAN MEDOXOMIL 40 MG PO TABS: 40.0000 mg | ORAL_TABLET | Freq: Every day | ORAL | 1 refills | Status: DC

## 2019-09-16 NOTE — Progress Notes (Signed)
Patient ID: Daniel Powers, male    DOB: 11-03-1955  Age: 64 y.o. MRN: [id number]    Subjective:  Subjective  HPI Daniel Powers presents for f/u and pt check.  He is willing now to try eliquis  No other complaints   Review of Systems  Constitutional: Negative for appetite change, diaphoresis, fatigue and unexpected weight change.  Eyes: Negative for pain, redness and visual disturbance.  Respiratory: Negative for cough, chest tightness, shortness of breath and wheezing.   Cardiovascular: Negative for chest pain, palpitations and leg swelling.  Endocrine: Negative for cold intolerance, heat intolerance, polydipsia, polyphagia and polyuria.  Genitourinary: Negative for difficulty urinating, dysuria and frequency.  Neurological: Negative for dizziness, light-headedness, numbness and headaches.    History Past Medical History:  Diagnosis Date  . Anxiety   . Arthritis   . Asthma   . COPD (chronic obstructive pulmonary disease) ([place])   . DVT (deep venous thrombosis) ([place])   . Gout   . High cholesterol   . Hypertension   . Influenza with pneumonia   . Lactose intolerance   . Obesity   . Pre-diabetes   . Psychosexual dysfunction with inhibited sexual excitement     He has a past surgical history that includes Tonsillectomy and adenoidectomy and Hand surgery.   His family history includes Alcohol abuse in his father; Alzheimer's disease in his maternal grandmother, mother, and paternal aunt; Cancer in his sister; Esophageal cancer in his maternal uncle; Heart disease (age of onset: 57) in his brother; Hypertension in his father, maternal grandfather, maternal grandmother, mother, paternal grandfather, and paternal grandmother; Lung cancer in his maternal uncle; Obesity in his father.He reports that he has never smoked. He has never used smokeless tobacco. He reports current alcohol use. He reports that he does not use drugs.  Current Outpatient Medications on File Prior to Visit    Medication Sig Dispense Refill  . albuterol (PROAIR HFA) 108 (90 Base) MCG/ACT inhaler Inhale 2 puffs into the lungs every 6 (six) hours as needed for wheezing. 1 Inhaler 2  . amLODipine (NORVASC) 5 MG tablet TAKE 1 TABLET(5 MG) BY MOUTH DAILY 90 tablet 1  . atorvastatin (LIPITOR) 10 MG tablet TAKE 1 TABLET(10 MG) BY MOUTH DAILY AT 6 PM 90 tablet 1  . blood glucose meter kit and supplies KIT Dispense based on patient and insurance preference. Use up to four times daily as directed. (FOR ICD-9 250.00, 250.01). 1 each 0  . cetirizine (ZYRTEC) 10 MG tablet Take 10 mg by mouth daily.    [name] CINNAMON PO Take 1 tablet by mouth daily.    . fish oil-omega-3 fatty acids 1000 MG capsule Take 1 g by mouth daily.    . Flaxseed, Linseed, 1000 MG CAPS Take 1 capsule by mouth daily.    . fluticasone (FLONASE) 50 MCG/ACT nasal spray Place 2 sprays into both nostrils daily. 16 g 6  . olmesartan (BENICAR) 20 MG tablet TAKE 1 TABLET(20 MG) BY MOUTH DAILY 90 tablet 1  . tadalafil (CIALIS) 20 MG tablet Take 20 mg by mouth daily as needed.     Current Facility-Administered Medications on File Prior to Visit  Medication Dose Route Frequency Provider Last Rate Last Admin  . 0.9 %  sodium chloride infusion  500 mL Intravenous Continuous Armbruster, [name], MD         Objective:  Objective  Physical Exam Constitutional:      General: He is sleeping.     Appearance: He  is well-developed.  HENT:     Head: Normocephalic and atraumatic.  Eyes:     Pupils: Pupils are equal, round, and reactive to light.  Neck:     Thyroid: No thyromegaly.  Cardiovascular:     Rate and Rhythm: Normal rate and regular rhythm.     Heart sounds: No murmur.  Pulmonary:     Effort: Pulmonary effort is normal. No respiratory distress.     Breath sounds: Normal breath sounds. No wheezing or rales.  Chest:     Chest wall: No tenderness.  Musculoskeletal:        General: No tenderness.     Cervical back: Normal range of motion and  neck supple.  Skin:    General: Skin is warm and dry.  Neurological:     Mental Status: He is oriented to person, place, and time.  Psychiatric:        Behavior: Behavior normal.        Thought Content: Thought content normal.        Judgment: Judgment normal.    BP (!) 148/80 (BP Location: Right Arm, Patient Position: Sitting, Cuff Size: Large)   Pulse 78   Temp 97.7 F (36.5 C) (Temporal)   Resp 18   Ht '5\' 11"'$  (1.803 m)   Wt (!) 302 lb (137 kg)   SpO2 98%   BMI 42.12 kg/m  Wt Readings from Last 3 Encounters:  09/16/19 (!) 302 lb (137 kg)  09/02/19 295 lb (133.8 kg)  07/28/19 295 lb (133.8 kg)     Lab Results  Component Value Date   WBC 6.5 12/25/2018   HGB 14.5 12/25/2018   HCT 45.1 12/25/2018   PLT 221.0 06/28/2017   GLUCOSE 75 09/16/2019   CHOL 116 09/16/2019   TRIG 71.0 09/16/2019   HDL 38.80 (L) 09/16/2019   LDLCALC 63 09/16/2019   ALT 14 09/16/2019   AST 12 09/16/2019   NA 139 09/16/2019   K 4.0 09/16/2019   CL 104 09/16/2019   CREATININE 1.01 09/16/2019   BUN 19 09/16/2019   CO2 30 09/16/2019   TSH 0.920 12/25/2018   PSA 1.31 06/28/2017   INR 1.8 (A) 09/16/2019   HGBA1C 5.6 09/16/2019   MICROALBUR 1.1 06/05/2019    DG Chest 2 View  Result Date: 02/02/2018 CLINICAL DATA:  Chest pain with deep inspiration for the past 2 months. EXAM: CHEST - 2 VIEW COMPARISON:  Chest x-ray dated February 05, 2012. FINDINGS: The heart size and mediastinal contours are within normal limits. Normal pulmonary vascularity. No focal consolidation, pleural effusion, or pneumothorax. No acute osseous abnormality. IMPRESSION: No active cardiopulmonary disease. Electronically Signed   By: Titus Dubin M.D.   On: 02/02/2018 09:39   DG Thoracic Spine 2 View  Result Date: 02/02/2018 CLINICAL DATA:  Back pain with deep inspiration over the past 2 months. EXAM: THORACIC SPINE 2 VIEWS COMPARISON:  Chest x-ray dated February 05, 2012. FINDINGS: Twelve rib-bearing thoracic vertebral bodies.  No acute fracture or subluxation. Vertebral body heights are preserved. Alignment is normal. Flowing anterior endplate osteophytes, consistent with DISH. Intervertebral disc spaces are maintained. IMPRESSION: 1.  No acute osseous abnormality. 2. Findings consistent with DISH. Electronically Signed   By: Titus Dubin M.D.   On: 02/02/2018 09:41     Assessment & Plan:  Plan  I have discontinued Daniel Powers chlorthalidone and warfarin. I am also having him start on olmesartan and apixaban. Additionally, I am having him maintain his fish oil-omega-3  fatty acids, tadalafil, Flaxseed (Linseed), cetirizine, fluticasone, CINNAMON PO, albuterol, blood glucose meter kit and supplies, amLODipine, olmesartan, atorvastatin, and Vitamin D (Ergocalciferol). We will continue to administer sodium chloride.  Meds ordered this encounter  Medications  . Vitamin D, Ergocalciferol, (DRISDOL) 1.25 MG (50000 UNIT) CAPS capsule    Sig: Take 1 capsule (50,000 Units total) by mouth every 7 (seven) days.    Dispense:  4 capsule    Refill:  0  . olmesartan (BENICAR) 40 MG tablet    Sig: Take 1 tablet (40 mg total) by mouth daily.    Dispense:  90 tablet    Refill:  1  . apixaban (ELIQUIS) 5 MG TABS tablet    Sig: Take 1 tablet (5 mg total) by mouth 2 (two) times daily.    Dispense:  60 tablet    Refill:  5    Problem List Items Addressed This Visit      Unprioritized   Diet-controlled diabetes mellitus ([place])    con't healthy weight and wellness Check labs       Relevant Medications   olmesartan (BENICAR) 40 MG tablet   Essential hypertension - Primary    Poorly controlled will alter medications, encouraged DASH diet, minimize caffeine and obtain adequate sleep. 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  Ann Held, DO

## 2019-09-16 NOTE — Patient Instructions (Signed)
Apixaban oral tablets What is this medicine? APIXABAN (a PIX a ban) is an anticoagulant (blood thinner). It is used to lower the chance of stroke in people with a medical condition called atrial fibrillation. It is also used to treat or prevent blood clots in the lungs or in the veins. This medicine may be used for other purposes; ask your health care provider or pharmacist if you have questions. COMMON BRAND NAME(S): Eliquis What should I tell my health care provider before I take this medicine? They need to know if you have any of these conditions:  antiphospholipid antibody syndrome  bleeding disorders  bleeding in the brain  blood in your stools (black or tarry stools) or if you have blood in your vomit  history of blood clots  history of stomach bleeding  kidney disease  liver disease  mechanical heart valve  an unusual or allergic reaction to apixaban, other medicines, foods, dyes, or preservatives  pregnant or trying to get pregnant  breast-feeding How should I use this medicine? Take this medicine by mouth with a glass of water. Follow the directions on the prescription label. You can take it with or without food. If it upsets your stomach, take it with food. Take your medicine at regular intervals. Do not take it more often than directed. Do not stop taking except on your doctor's advice. Stopping this medicine may increase your risk of a blood clot. Be sure to refill your prescription before you run out of medicine. Talk to your pediatrician regarding the use of this medicine in children. Special care may be needed. Overdosage: If you think you have taken too much of this medicine contact a poison control center or emergency room at once. NOTE: This medicine is only for you. Do not share this medicine with others. What if I miss a dose? If you miss a dose, take it as soon as you can. If it is almost time for your next dose, take only that dose. Do not take double or  extra doses. What may interact with this medicine? This medicine may interact with the following:  aspirin and aspirin-like medicines  certain medicines for fungal infections like ketoconazole and itraconazole  certain medicines for seizures like carbamazepine and phenytoin  certain medicines that treat or prevent blood clots like warfarin, enoxaparin, and dalteparin  clarithromycin  NSAIDs, medicines for pain and inflammation, like ibuprofen or naproxen  rifampin  ritonavir  St. John's wort This list may not describe all possible interactions. Give your health care provider a list of all the medicines, herbs, non-prescription drugs, or dietary supplements you use. Also tell them if you smoke, drink alcohol, or use illegal drugs. Some items may interact with your medicine. What should I watch for while using this medicine? Visit your healthcare professional for regular checks on your progress. You may need blood work done while you are taking this medicine. Your condition will be monitored carefully while you are receiving this medicine. It is important not to miss any appointments. Avoid sports and activities that might cause injury while you are using this medicine. Severe falls or injuries can cause unseen bleeding. Be careful when using sharp tools or knives. Consider using an electric razor. Take special care brushing or flossing your teeth. Report any injuries, bruising, or red spots on the skin to your healthcare professional. If you are going to need surgery or other procedure, tell your healthcare professional that you are taking this medicine. Wear a medical ID bracelet   or chain. Carry a card that describes your disease and details of your medicine and dosage times. What side effects may I notice from receiving this medicine? Side effects that you should report to your doctor or health care professional as soon as possible:  allergic reactions like skin rash, itching or hives,  swelling of the face, lips, or tongue  signs and symptoms of bleeding such as bloody or black, tarry stools; red or dark-brown urine; spitting up blood or brown material that looks like coffee grounds; red spots on the skin; unusual bruising or bleeding from the eye, gums, or nose  signs and symptoms of a blood clot such as chest pain; shortness of breath; pain, swelling, or warmth in the leg  signs and symptoms of a stroke such as changes in vision; confusion; trouble speaking or understanding; severe headaches; sudden numbness or weakness of the face, arm or leg; trouble walking; dizziness; loss of coordination This list may not describe all possible side effects. Call your doctor for medical advice about side effects. You may report side effects to FDA at 1-800-FDA-1088. Where should I keep my medicine? Keep out of the reach of children. Store at room temperature between 20 and 25 degrees C (68 and 77 degrees F). Throw away any unused medicine after the expiration date. NOTE: This sheet is a summary. It may not cover all possible information. If you have questions about this medicine, talk to your doctor, pharmacist, or health care provider.  2020 Elsevier/Gold Standard (2018-03-13 17:39:34)  

## 2019-09-17 NOTE — Assessment & Plan Note (Signed)
con't healthy weight and wellness Check labs

## 2019-09-17 NOTE — Assessment & Plan Note (Signed)
D/c coumadin and start eliquis 5 mg bid

## 2019-09-17 NOTE — Assessment & Plan Note (Signed)
Poorly controlled will alter medications, encouraged DASH diet, minimize caffeine and obtain adequate sleep. Report concerning symptoms and follow up as directed and as needed  benicar increased --- f/u 3 months or sooner prn

## 2019-09-23 ENCOUNTER — Encounter (INDEPENDENT_AMBULATORY_CARE_PROVIDER_SITE_OTHER): Payer: Self-pay | Admitting: Physician Assistant

## 2019-09-23 ENCOUNTER — Other Ambulatory Visit: Payer: Self-pay

## 2019-09-23 ENCOUNTER — Ambulatory Visit (INDEPENDENT_AMBULATORY_CARE_PROVIDER_SITE_OTHER): Payer: Medicare HMO | Admitting: Physician Assistant

## 2019-09-23 VITALS — BP 153/77 | HR 85 | Temp 99.0°F | Ht 71.0 in | Wt 296.0 lb

## 2019-09-23 DIAGNOSIS — E785 Hyperlipidemia, unspecified: Secondary | ICD-10-CM

## 2019-09-23 DIAGNOSIS — Z6841 Body Mass Index (BMI) 40.0 and over, adult: Secondary | ICD-10-CM

## 2019-09-23 DIAGNOSIS — E559 Vitamin D deficiency, unspecified: Secondary | ICD-10-CM

## 2019-09-23 DIAGNOSIS — E1169 Type 2 diabetes mellitus with other specified complication: Secondary | ICD-10-CM | POA: Diagnosis not present

## 2019-09-23 NOTE — Progress Notes (Signed)
Chief Complaint:   OBESITY Daniel Powers is here to discuss his progress with his obesity treatment plan along with follow-up of his obesity related diagnoses. Daniel Powers is on the Category 4 Plan and states he is following his eating plan approximately 50% of the time. Daniel Powers states he is bicycling 30 minutes 1 time per week.  Today's visit was #: 15 Starting weight: 309 lbs Starting date: 12/25/2018 Today's weight: 296 lbs Today's date: 09/23/2019 Total lbs lost to date: 13 Total lbs lost since last in-office visit: 0  Interim History: Daniel Powers reports eating a sausage and egg muffin in the morning, sometimes skipping lunch and then going out to eat for dinner. In general, he is not eating enough protein throughout the day.  Subjective:   Vitamin D deficiency. Daniel Powers is on Vitamin D. No nausea, vomiting, or muscle weakness.  Last Vitamin D level was not at goal - 35.33 on 09/16/2019.  Type 2 diabetes mellitus with hyperlipidemia ([place]). Daniel Powers is on no medications. No hypoglycemia. No chest pain.  Lab Results  Component Value Date   HGBA1C 5.6 09/16/2019   HGBA1C 5.8 06/05/2019   HGBA1C 5.7 (H) 12/25/2018   Lab Results  Component Value Date   MICROALBUR 1.1 06/05/2019   LDLCALC 63 09/16/2019   CREATININE 1.01 09/16/2019   Lab Results  Component Value Date   INSULIN 9.5 12/25/2018   Assessment/Plan:   Vitamin D deficiency. Low Vitamin D level contributes to fatigue and are associated with obesity, breast, and colon cancer. He agrees to continue to take Vitamin D and will follow-up for routine testing of Vitamin D, at least 2-3 times per year to avoid over-replacement.  Type 2 diabetes mellitus with hyperlipidemia ([place]). Good blood sugar control is important to decrease the likelihood of diabetic complications such as nephropathy, neuropathy, limb loss, blindness, coronary artery disease, and death. Intensive lifestyle modification including diet, exercise and weight loss are  the first line of treatment for diabetes.   Class 3 severe obesity with serious comorbidity and body mass index (BMI) of 40.0 to 44.9 in adult, unspecified obesity type ([place]).  Daniel Powers is currently in the action stage of change. As such, his goal is to continue with weight loss efforts. He has agreed to the Category 4 Plan.   Exercise goals: Older adults should follow the adult guidelines. When older adults cannot meet the adult guidelines, they should be as physically active as their abilities and conditions will allow.   Behavioral modification strategies: decreasing eating out and meal planning and cooking strategies.  Daniel Powers has agreed to follow-up with our clinic in 2 weeks. He was informed of the importance of frequent follow-up visits to maximize his success with intensive lifestyle modifications for his multiple health conditions.   Objective:   Blood pressure (!) 153/77, pulse 85, temperature 99 F (37.2 C), temperature source Oral, height 5\' 11"  (1.803 m), weight 296 lb (134.3 kg), SpO2 99 %. Body mass index is 41.28 kg/m.  General: Cooperative, alert, well developed, in no acute distress. HEENT: Conjunctivae and lids unremarkable. Cardiovascular: Regular rhythm.  Lungs: Normal work of breathing. Neurologic: No focal deficits.   Lab Results  Component Value Date   CREATININE 1.01 09/16/2019   BUN 19 09/16/2019   NA 139 09/16/2019   K 4.0 09/16/2019   CL 104 09/16/2019   CO2 30 09/16/2019   Lab Results  Component Value Date   ALT 14 09/16/2019   AST 12 09/16/2019   ALKPHOS 41  09/16/2019   BILITOT 0.6 09/16/2019   Lab Results  Component Value Date   HGBA1C 5.6 09/16/2019   HGBA1C 5.8 06/05/2019   HGBA1C 5.7 (H) 12/25/2018   HGBA1C 6.1 08/05/2018   HGBA1C 6.2 02/01/2018   Lab Results  Component Value Date   INSULIN 9.5 12/25/2018   Lab Results  Component Value Date   TSH 0.920 12/25/2018   Lab Results  Component Value Date   CHOL 116 09/16/2019   HDL  38.80 (L) 09/16/2019   LDLCALC 63 09/16/2019   TRIG 71.0 09/16/2019   CHOLHDL 3 09/16/2019   Lab Results  Component Value Date   WBC 6.5 12/25/2018   HGB 14.5 12/25/2018   HCT 45.1 12/25/2018   MCV 84 12/25/2018   PLT 221.0 06/28/2017   No results found for: IRON, TIBC, FERRITIN  Obesity Behavioral Intervention Documentation for Insurance:   Approximately 15 minutes were spent on the discussion below.  ASK: We discussed the diagnosis of obesity with Daniel Powers today and Daniel Powers agreed to give [name] permission to discuss obesity behavioral modification therapy today.  ASSESS: Daniel Powers has the diagnosis of obesity and his BMI today is 41.3. [place] is in the action stage of change.   ADVISE: Daniel Powers was educated on the multiple health risks of obesity as well as the benefit of weight loss to improve his health. He was advised of the need for long term treatment and the importance of lifestyle modifications to improve his current health and to decrease his risk of future health problems.  AGREE: Multiple dietary modification options and treatment options were discussed and Daniel Powers agreed to follow the recommendations documented in the above note.  ARRANGE: Daniel Powers was educated on the importance of frequent visits to treat obesity as outlined per CMS and USPSTF guidelines and agreed to schedule his next follow up appointment today.  Attestation Statements:   Reviewed by clinician on day of visit: allergies, medications, problem list, medical history, surgical history, family history, social history, and previous encounter notes.  Daniel Powers, am acting as transcriptionist for Abby Potash, PA-C   I have reviewed the above documentation for accuracy and completeness, and I agree with the above. Abby Potash, PA-C

## 2019-10-01 ENCOUNTER — Ambulatory Visit: Payer: Medicare HMO

## 2019-10-04 ENCOUNTER — Ambulatory Visit: Payer: Medicare HMO | Attending: Internal Medicine

## 2019-10-04 DIAGNOSIS — Z23 Encounter for immunization: Secondary | ICD-10-CM

## 2019-10-04 NOTE — Progress Notes (Signed)
   Covid-19 Vaccination Clinic  Name:  Daniel Powers    MRN: [id number] DOB: [birth year]  10/04/2019  [name] was observed post Covid-19 immunization for 30 minutes based on pre-vaccination screening without incident. He was provided with Vaccine Information Sheet and instruction to access the V-Safe system.   [name] was instructed to call 911 with any severe reactions post vaccine: [name] Difficulty breathing  . Swelling of face and throat  . A fast heartbeat  . A bad rash all over body  . Dizziness and weakness   Immunizations Administered    Name Date Dose VIS Date Route   Pfizer COVID-19 Vaccine 10/04/2019 11:42 AM 0.3 mL 06/27/2019 Intramuscular   Manufacturer: [place]   Lot: [id number]   [place]: [id number]

## 2019-10-13 ENCOUNTER — Ambulatory Visit (INDEPENDENT_AMBULATORY_CARE_PROVIDER_SITE_OTHER): Payer: Medicare HMO | Admitting: Physician Assistant

## 2019-10-13 ENCOUNTER — Encounter (INDEPENDENT_AMBULATORY_CARE_PROVIDER_SITE_OTHER): Payer: Self-pay | Admitting: Physician Assistant

## 2019-10-13 ENCOUNTER — Other Ambulatory Visit: Payer: Self-pay

## 2019-10-13 ENCOUNTER — Other Ambulatory Visit (INDEPENDENT_AMBULATORY_CARE_PROVIDER_SITE_OTHER): Payer: Self-pay | Admitting: Physician Assistant

## 2019-10-13 VITALS — BP 149/80 | HR 71 | Temp 98.8°F | Ht 71.0 in | Wt 299.0 lb

## 2019-10-13 DIAGNOSIS — Z6841 Body Mass Index (BMI) 40.0 and over, adult: Secondary | ICD-10-CM | POA: Diagnosis not present

## 2019-10-13 DIAGNOSIS — G4733 Obstructive sleep apnea (adult) (pediatric): Secondary | ICD-10-CM | POA: Diagnosis not present

## 2019-10-13 DIAGNOSIS — E559 Vitamin D deficiency, unspecified: Secondary | ICD-10-CM | POA: Diagnosis not present

## 2019-10-13 MED ORDER — VITAMIN D (ERGOCALCIFEROL) 1.25 MG (50000 UNIT) PO CAPS: 50000.0000 [IU] | ORAL_CAPSULE | ORAL | 0 refills | Status: DC

## 2019-10-13 NOTE — Progress Notes (Signed)
Chief Complaint:   Daniel Powers is here to discuss his progress with his Daniel treatment plan along with follow-up of his Daniel related diagnoses. Daniel Powers is on the Category 4 Plan and states he is following his eating plan approximately 75% of the time. Daniel Powers states he is exercising 0 minutes 0 times per week.  Today's visit was #: 41 Starting weight: 309 lbs Starting date: 12/25/2018 Today's weight: 299 lbs Today's date: 10/13/2019 Total lbs lost to date: 10 Total lbs lost since last in-office visit: 0  Interim History: Daniel Powers states that he continues to eat out often. He is skipping meals intermittently. He reports not using his CPAP machine.  Subjective:   Vitamin D deficiency. No nausea, vomiting, or muscle weakness. Daniel Powers is on prescription Vitamin D weekly. Last Vitamin D 35.33 on 09/16/2019. He is due for labs.  OSA (obstructive sleep apnea). Daniel Powers states that he has not used his CPAP in approximately 2 years. He reports that he does not like the way his mask feels.  Assessment/Plan:   Vitamin D deficiency. Low Vitamin D level contributes to fatigue and are associated with Daniel, breast, and colon cancer. He was given a refill on his Vitamin D, Ergocalciferol, (DRISDOL) 1.25 MG (50000 UNIT) CAPS capsule every week #4 with 0 refills and will follow-up for routine testing of Vitamin D, at least 2-3 times per year to avoid over-replacement.    OSA (obstructive sleep apnea). Intensive lifestyle modifications are the first line treatment for this issue. We discussed several lifestyle modifications today and he will continue to work on diet, exercise and weight loss efforts. We will continue to monitor. Orders and follow up as documented in patient record. Daniel Powers will schedule an appointment with Neurology regarding CPAP and OSA.  Counseling  Sleep apnea is a condition in which breathing pauses or becomes shallow during sleep. This happens over and over during  the night. This disrupts your sleep and keeps your body from getting the rest that it needs, which can cause tiredness and lack of energy (fatigue) during the day.  Sleep apnea treatment: If you were given a device to open your airway while you sleep, USE IT!  Sleep hygiene:   Limit or avoid alcohol, caffeinated beverages, and cigarettes, especially close to bedtime.   Do not eat a large meal or eat spicy foods right before bedtime. This can lead to digestive discomfort that can make it hard for you to sleep.  Keep a sleep diary to help you and your health care provider figure out what could be causing your insomnia.  . Make your bedroom a dark, comfortable place where it is easy to fall asleep. ? Put up shades or blackout curtains to block light from outside. ? Use a white noise machine to block noise. ? Keep the temperature cool. . Limit screen use before bedtime. This includes: ? Watching TV. ? Using your smartphone, tablet, or computer. . Stick to a routine that includes going to bed and waking up at the same times every day and night. This can help you fall asleep faster. Consider making a quiet activity, such as reading, part of your nighttime routine. . Try to avoid taking naps during the day so that you sleep better at night. . Get out of bed if you are still awake after 15 minutes of trying to sleep. Keep the lights down, but try reading or doing a quiet activity. When you feel sleepy, go back to bed.  Class 3 severe Daniel with serious comorbidity and body mass index (BMI) of 40.0 to 44.9 in adult, unspecified Daniel type (Daniel Powers).  Daniel Powers is currently in the action stage of change. As such, his goal is to continue with weight loss efforts. He has agreed to the Category 4 Plan.   Exercise goals: Older adults should follow the adult guidelines. When older adults cannot meet the adult guidelines, they should be as physically active as their abilities and conditions will allow.    Behavioral modification strategies: increasing lean protein intake and no skipping meals.  Daniel Powers has agreed to follow-up with our clinic in 3 weeks. He was informed of the importance of frequent follow-up visits to maximize his success with intensive lifestyle modifications for his multiple health conditions.   Objective:   Blood pressure (!) 149/80, pulse 71, temperature 98.8 F (37.1 C), temperature source Oral, height 5\' 11"  (1.803 m), weight 299 lb (135.6 kg), SpO2 98 %. Body mass index is 41.7 kg/m.  General: Cooperative, alert, well developed, in no acute distress. HEENT: Conjunctivae and lids unremarkable. Cardiovascular: Regular rhythm.  Lungs: Normal work of breathing. Neurologic: No focal deficits.   Lab Results  Component Value Date   CREATININE 1.01 09/16/2019   BUN 19 09/16/2019   NA 139 09/16/2019   K 4.0 09/16/2019   CL 104 09/16/2019   CO2 30 09/16/2019   Lab Results  Component Value Date   ALT 14 09/16/2019   AST 12 09/16/2019   ALKPHOS 41 09/16/2019   BILITOT 0.6 09/16/2019   Lab Results  Component Value Date   HGBA1C 5.6 09/16/2019   HGBA1C 5.8 06/05/2019   HGBA1C 5.7 (H) 12/25/2018   HGBA1C 6.1 08/05/2018   HGBA1C 6.2 02/01/2018   Lab Results  Component Value Date   INSULIN 9.5 12/25/2018   Lab Results  Component Value Date   TSH 0.920 12/25/2018   Lab Results  Component Value Date   CHOL 116 09/16/2019   HDL 38.80 (L) 09/16/2019   LDLCALC 63 09/16/2019   TRIG 71.0 09/16/2019   CHOLHDL 3 09/16/2019   Lab Results  Component Value Date   WBC 6.5 12/25/2018   HGB 14.5 12/25/2018   HCT 45.1 12/25/2018   MCV 84 12/25/2018   PLT 221.0 06/28/2017   No results found for: IRON, TIBC, FERRITIN  Daniel Behavioral Intervention Documentation for Insurance:   Approximately 15 minutes were spent on the discussion below.  ASK: We discussed the diagnosis of Daniel with Daniel Powers today and Daniel Powers agreed to give [name] permission to discuss  Daniel behavioral modification therapy today.  ASSESS: Daniel Powers has the diagnosis of Daniel and his BMI today is 41.8. Daniel Powers is in the action stage of change.   ADVISE: Daniel Powers was educated on the multiple health risks of Daniel as well as the benefit of weight loss to improve his health. He was advised of the need for long term treatment and the importance of lifestyle modifications to improve his current health and to decrease his risk of future health problems.  AGREE: Multiple dietary modification options and treatment options were discussed and Daniel Powers agreed to follow the recommendations documented in the above note.  ARRANGE: Daniel Powers was educated on the importance of frequent visits to treat Daniel as outlined per CMS and USPSTF guidelines and agreed to schedule his next follow up appointment today.  Attestation Statements:   Reviewed by clinician on day of visit: allergies, medications, problem list, medical history, surgical history, family history, social history, and  previous encounter notes.  IMichaelene Song, am acting as transcriptionist for Abby Potash, PA-C   I have reviewed the above documentation for accuracy and completeness, and I agree with the above. Abby Potash, PA-C

## 2019-10-31 ENCOUNTER — Telehealth: Payer: Self-pay

## 2019-10-31 DIAGNOSIS — I82401 Acute embolism and thrombosis of unspecified deep veins of right lower extremity: Secondary | ICD-10-CM

## 2019-10-31 MED ORDER — APIXABAN 5 MG PO TABS: 5.0000 mg | ORAL_TABLET | Freq: Two times a day (BID) | ORAL | 5 refills | Status: DC

## 2019-10-31 NOTE — Telephone Encounter (Signed)
Patient called in to see if DR. Lowne could send in a reauthorization to the patients insurance company for   apixaban (ELIQUIS) 5 MG TABS tablet [id number]   Patients insurance is :[place] [employer]   And then please send the prescription to [facility], [address]  [place], [place] [address]  Phone:  [phone] Fax:  [phone]  DEA #:  [id number]

## 2019-10-31 NOTE — Telephone Encounter (Signed)
Sent in and the patient called left msg sent in script

## 2019-11-03 ENCOUNTER — Other Ambulatory Visit: Payer: Self-pay

## 2019-11-03 ENCOUNTER — Encounter (INDEPENDENT_AMBULATORY_CARE_PROVIDER_SITE_OTHER): Payer: Self-pay | Admitting: Physician Assistant

## 2019-11-03 ENCOUNTER — Ambulatory Visit (INDEPENDENT_AMBULATORY_CARE_PROVIDER_SITE_OTHER): Payer: Medicare HMO | Admitting: Physician Assistant

## 2019-11-03 VITALS — BP 150/84 | HR 69 | Temp 98.6°F | Ht 71.0 in | Wt 298.0 lb

## 2019-11-03 DIAGNOSIS — Z6841 Body Mass Index (BMI) 40.0 and over, adult: Secondary | ICD-10-CM | POA: Diagnosis not present

## 2019-11-03 DIAGNOSIS — E7849 Other hyperlipidemia: Secondary | ICD-10-CM | POA: Diagnosis not present

## 2019-11-03 DIAGNOSIS — E559 Vitamin D deficiency, unspecified: Secondary | ICD-10-CM | POA: Diagnosis not present

## 2019-11-04 ENCOUNTER — Other Ambulatory Visit (INDEPENDENT_AMBULATORY_CARE_PROVIDER_SITE_OTHER): Payer: Self-pay | Admitting: Physician Assistant

## 2019-11-04 DIAGNOSIS — E559 Vitamin D deficiency, unspecified: Secondary | ICD-10-CM

## 2019-11-04 MED ORDER — VITAMIN D (ERGOCALCIFEROL) 1.25 MG (50000 UNIT) PO CAPS: 50000.0000 [IU] | ORAL_CAPSULE | ORAL | 0 refills | Status: DC

## 2019-11-04 NOTE — Progress Notes (Signed)
Chief Complaint:   OBESITY Daniel Powers is here to discuss his progress with his obesity treatment plan along with follow-up of his obesity related diagnoses. Terion is on the Category 4 Plan and states he is following his eating plan approximately 75% of the time. Zarrar states he is walking 45 minutes 1 time per week.  Today's visit was #: 57 Starting weight: 309 lbs Starting date: 12/25/2018 Today's weight: 298 lbs Today's date: 11/03/2019 Total lbs lost to date: 11 Total lbs lost since last in-office visit: 1  Interim History: Daniel Powers states that he has done a better job with not skipping meals. He is using protein shakes when he has to replace a meal.  Subjective:   Vitamin D deficiency. Christepher is on prescription Vitamin D. No nausea, vomiting, or muscle weakness. Last Vitamin D 35.33 on 09/16/2019.  Other hyperlipidemia. Kaler is on atorvastatin. No chest pain or myalgias.   Lab Results  Component Value Date   CHOL 116 09/16/2019   HDL 38.80 (L) 09/16/2019   LDLCALC 63 09/16/2019   TRIG 71.0 09/16/2019   CHOLHDL 3 09/16/2019   Lab Results  Component Value Date   ALT 14 09/16/2019   AST 12 09/16/2019   ALKPHOS 41 09/16/2019   BILITOT 0.6 09/16/2019   The ASCVD Risk score [name] DC Jr., et al., 2013) failed to calculate for the following reasons:   The valid total cholesterol range is 130 to 320 mg/dL  Assessment/Plan:   Vitamin D deficiency. Low Vitamin D level contributes to fatigue and are associated with obesity, breast, and colon cancer. He was given a refill on his Vitamin D, Ergocalciferol, (DRISDOL) 1.25 MG (50000 UNIT) CAPS capsule every week #4 with 0 refills and will follow-up for routine testing of Vitamin D, at least 2-3 times per year to avoid over-replacement.    Other hyperlipidemia. Cardiovascular risk and specific lipid/LDL goals reviewed.  We discussed several lifestyle modifications today and Trapper will continue to work on diet, exercise and  weight loss efforts. Orders and follow up as documented in patient record. He will continue his medication as directed.  Counseling Intensive lifestyle modifications are the first line treatment for this issue. . Dietary changes: Increase soluble fiber. Decrease simple carbohydrates. . Exercise changes: Moderate to vigorous-intensity aerobic activity 150 minutes per week if tolerated. . Lipid-lowering medications: see documented in medical record.  Class 3 severe obesity with serious comorbidity and body mass index (BMI) of 40.0 to 44.9 in adult, unspecified obesity type ([place]).  Daniel Powers is currently in the action stage of change. As such, his goal is to continue with weight loss efforts. He has agreed to the Category 4 Plan.   Exercise goals: Older adults should follow the adult guidelines. When older adults cannot meet the adult guidelines, they should be as physically active as their abilities and conditions will allow.   Behavioral modification strategies: meal planning and cooking strategies and keeping healthy foods in the home.  Daniel Powers has agreed to follow-up with our clinic in 3 weeks. He was informed of the importance of frequent follow-up visits to maximize his success with intensive lifestyle modifications for his multiple health conditions.   Objective:   Blood pressure (!) 150/84, pulse 69, temperature 98.6 F (37 C), temperature source Oral, height 5\' 11"  (1.803 m), weight 298 lb (135.2 kg), SpO2 98 %. Body mass index is 41.56 kg/m.  General: Cooperative, alert, well developed, in no acute distress. HEENT: Conjunctivae and lids unremarkable. Cardiovascular:  Regular rhythm.  Lungs: Normal work of breathing. Neurologic: No focal deficits.   Lab Results  Component Value Date   CREATININE 1.01 09/16/2019   BUN 19 09/16/2019   NA 139 09/16/2019   K 4.0 09/16/2019   CL 104 09/16/2019   CO2 30 09/16/2019   Lab Results  Component Value Date   ALT 14 09/16/2019   AST 12  09/16/2019   ALKPHOS 41 09/16/2019   BILITOT 0.6 09/16/2019   Lab Results  Component Value Date   HGBA1C 5.6 09/16/2019   HGBA1C 5.8 06/05/2019   HGBA1C 5.7 (H) 12/25/2018   HGBA1C 6.1 08/05/2018   HGBA1C 6.2 02/01/2018   Lab Results  Component Value Date   INSULIN 9.5 12/25/2018   Lab Results  Component Value Date   TSH 0.920 12/25/2018   Lab Results  Component Value Date   CHOL 116 09/16/2019   HDL 38.80 (L) 09/16/2019   LDLCALC 63 09/16/2019   TRIG 71.0 09/16/2019   CHOLHDL 3 09/16/2019   Lab Results  Component Value Date   WBC 6.5 12/25/2018   HGB 14.5 12/25/2018   HCT 45.1 12/25/2018   MCV 84 12/25/2018   PLT 221.0 06/28/2017   No results found for: IRON, TIBC, FERRITIN  Obesity Behavioral Intervention Documentation for Insurance:   Approximately 15 minutes were spent on the discussion below.  ASK: We discussed the diagnosis of obesity with Daniel Powers today and Daniel Powers agreed to give [name] permission to discuss obesity behavioral modification therapy today.  ASSESS: Daniel Powers has the diagnosis of obesity and his BMI today is 41.7. Daniel Powers is in the action stage of change.   ADVISE: Daniel Powers was educated on the multiple health risks of obesity as well as the benefit of weight loss to improve his health. He was advised of the need for long term treatment and the importance of lifestyle modifications to improve his current health and to decrease his risk of future health problems.  AGREE: Multiple dietary modification options and treatment options were discussed and Daniel Powers agreed to follow the recommendations documented in the above note.  ARRANGE: Daniel Powers was educated on the importance of frequent visits to treat obesity as outlined per CMS and USPSTF guidelines and agreed to schedule his next follow up appointment today.  Attestation Statements:   Reviewed by clinician on day of visit: allergies, medications, problem list, medical history, surgical history, family  history, social history, and previous encounter notes.  IMichaelene Song, am acting as transcriptionist for Abby Potash, PA-C   I have reviewed the above documentation for accuracy and completeness, and I agree with the above. Abby Potash, PA-C

## 2019-11-07 ENCOUNTER — Telehealth: Payer: Self-pay

## 2019-11-07 NOTE — Telephone Encounter (Signed)
PA initiated via Covermymeds; KEY: [id number]. PA cancelled by plan. Medication already available w/o authorization.

## 2019-11-25 ENCOUNTER — Other Ambulatory Visit: Payer: Self-pay

## 2019-11-25 ENCOUNTER — Encounter (INDEPENDENT_AMBULATORY_CARE_PROVIDER_SITE_OTHER): Payer: Self-pay | Admitting: Physician Assistant

## 2019-11-25 ENCOUNTER — Ambulatory Visit (INDEPENDENT_AMBULATORY_CARE_PROVIDER_SITE_OTHER): Payer: Medicare HMO | Admitting: Physician Assistant

## 2019-11-25 VITALS — BP 143/82 | HR 74 | Temp 98.5°F | Ht 71.0 in | Wt 297.0 lb

## 2019-11-25 DIAGNOSIS — E7849 Other hyperlipidemia: Secondary | ICD-10-CM | POA: Diagnosis not present

## 2019-11-25 DIAGNOSIS — Z6841 Body Mass Index (BMI) 40.0 and over, adult: Secondary | ICD-10-CM | POA: Diagnosis not present

## 2019-11-25 DIAGNOSIS — E66813 Obesity, class 3: Secondary | ICD-10-CM

## 2019-11-25 DIAGNOSIS — I10 Essential (primary) hypertension: Secondary | ICD-10-CM | POA: Diagnosis not present

## 2019-11-26 NOTE — Progress Notes (Signed)
Chief Complaint:   OBESITY Daniel Powers is here to discuss his progress with his obesity treatment plan along with follow-up of his obesity related diagnoses. Ricahrd is on the Category 4 Plan and states he is following his eating plan approximately 75% of the time. Thurl states he is exercising 0 minutes 0 times per week.  Today's visit was #: 18 Starting weight: 309 lbs Starting date: 12/25/2018 Today's weight: 297 lbs Today's date: 11/25/2019 Total lbs lost to date: 12 Total lbs lost since last in-office visit: 1  Interim History: Beecher states that he continues to sometimes skip lunch, but is drinking protein shakes on those occasions.  Subjective:   Essential hypertension. Omega is on amlodipine and Benicar. Blood pressure is slightly elevated today. No chest pain or headache. He has a physical with his PCP next week.  BP Readings from Last 3 Encounters:  11/25/19 (!) 143/82  11/03/19 (!) 150/84  10/13/19 (!) 149/80   Lab Results  Component Value Date   CREATININE 1.01 09/16/2019   CREATININE 0.94 06/05/2019   CREATININE 1.06 12/25/2018   Other hyperlipidemia. Eulices is on [employer] and Lipitor. No chest pain or myalgias.   Lab Results  Component Value Date   CHOL 116 09/16/2019   HDL 38.80 (L) 09/16/2019   LDLCALC 63 09/16/2019   TRIG 71.0 09/16/2019   CHOLHDL 3 09/16/2019   Lab Results  Component Value Date   ALT 14 09/16/2019   AST 12 09/16/2019   ALKPHOS 41 09/16/2019   BILITOT 0.6 09/16/2019   The ASCVD Risk score [name] DC Jr., et al., 2013) failed to calculate for the following reasons:   The valid total cholesterol range is 130 to 320 mg/dL  Assessment/Plan:   Essential hypertension. Lorn is working on healthy weight loss and exercise to improve blood pressure control. We will watch for signs of hypotension as he continues his lifestyle modifications. He will continue his medications as directed.  Other hyperlipidemia. Cardiovascular risk  and specific lipid/LDL goals reviewed.  We discussed several lifestyle modifications today and Isiaha will continue to work on diet, exercise and weight loss efforts. Orders and follow up as documented in patient record. He will continue his medications as directed.  Counseling Intensive lifestyle modifications are the first line treatment for this issue. . Dietary changes: Increase soluble fiber. Decrease simple carbohydrates. . Exercise changes: Moderate to vigorous-intensity aerobic activity 150 minutes per week if tolerated. . Lipid-lowering medications: see documented in medical record.  Class 3 severe obesity with serious comorbidity and body mass index (BMI) of 40.0 to 44.9 in adult, unspecified obesity type ([place]).  Jibri is currently in the action stage of change. As such, his goal is to continue with weight loss efforts. He has agreed to the Category 4 Plan. He will add a protein bar for lunch when needed.  Exercise goals: Older adults should follow the adult guidelines. When older adults cannot meet the adult guidelines, they should be as physically active as their abilities and conditions will allow.   Behavioral modification strategies: increasing lean protein intake and meal planning and cooking strategies.  Gonzales has agreed to follow-up with our clinic in 3 weeks. He was informed of the importance of frequent follow-up visits to maximize his success with intensive lifestyle modifications for his multiple health conditions.   Objective:   Blood pressure (!) 143/82, pulse 74, temperature 98.5 F (36.9 C), temperature source Oral, height 5\' 11"  (1.803 m), weight 297 lb (134.7  kg), SpO2 97 %. Body mass index is 41.42 kg/m.  General: Cooperative, alert, well developed, in no acute distress. HEENT: Conjunctivae and lids unremarkable. Cardiovascular: Regular rhythm.  Lungs: Normal work of breathing. Neurologic: No focal deficits.   Lab Results  Component Value Date    CREATININE 1.01 09/16/2019   BUN 19 09/16/2019   NA 139 09/16/2019   K 4.0 09/16/2019   CL 104 09/16/2019   CO2 30 09/16/2019   Lab Results  Component Value Date   ALT 14 09/16/2019   AST 12 09/16/2019   ALKPHOS 41 09/16/2019   BILITOT 0.6 09/16/2019   Lab Results  Component Value Date   HGBA1C 5.6 09/16/2019   HGBA1C 5.8 06/05/2019   HGBA1C 5.7 (H) 12/25/2018   HGBA1C 6.1 08/05/2018   HGBA1C 6.2 02/01/2018   Lab Results  Component Value Date   INSULIN 9.5 12/25/2018   Lab Results  Component Value Date   TSH 0.920 12/25/2018   Lab Results  Component Value Date   CHOL 116 09/16/2019   HDL 38.80 (L) 09/16/2019   LDLCALC 63 09/16/2019   TRIG 71.0 09/16/2019   CHOLHDL 3 09/16/2019   Lab Results  Component Value Date   WBC 6.5 12/25/2018   HGB 14.5 12/25/2018   HCT 45.1 12/25/2018   MCV 84 12/25/2018   PLT 221.0 06/28/2017   No results found for: IRON, TIBC, FERRITIN  Obesity Behavioral Intervention Documentation for Insurance:   Approximately 15 minutes were spent on the discussion below.  ASK: We discussed the diagnosis of obesity with [name] today and Peyten agreed to give [name] permission to discuss obesity behavioral modification therapy today.  ASSESS: Jaycek has the diagnosis of obesity and his BMI today is 41.4. Viggo is in the action stage of change.   ADVISE: Chinemerem was educated on the multiple health risks of obesity as well as the benefit of weight loss to improve his health. He was advised of the need for long term treatment and the importance of lifestyle modifications to improve his current health and to decrease his risk of future health problems.  AGREE: Multiple dietary modification options and treatment options were discussed and Jedadiah agreed to follow the recommendations documented in the above note.  ARRANGE: Mykel was educated on the importance of frequent visits to treat obesity as outlined per CMS and USPSTF guidelines and agreed to  schedule his next follow up appointment today.  Attestation Statements:   Reviewed by clinician on day of visit: allergies, medications, problem list, medical history, surgical history, family history, social history, and previous encounter notes.  IMichaelene Song, am acting as transcriptionist for Abby Potash, PA-C   I have reviewed the above documentation for accuracy and completeness, and I agree with the above. Abby Potash, PA-C

## 2019-11-27 ENCOUNTER — Encounter (INDEPENDENT_AMBULATORY_CARE_PROVIDER_SITE_OTHER): Payer: Self-pay

## 2019-11-27 ENCOUNTER — Other Ambulatory Visit (INDEPENDENT_AMBULATORY_CARE_PROVIDER_SITE_OTHER): Payer: Self-pay | Admitting: Physician Assistant

## 2019-11-27 DIAGNOSIS — E559 Vitamin D deficiency, unspecified: Secondary | ICD-10-CM

## 2019-12-04 ENCOUNTER — Encounter: Payer: Self-pay | Admitting: Family Medicine

## 2019-12-04 ENCOUNTER — Ambulatory Visit (INDEPENDENT_AMBULATORY_CARE_PROVIDER_SITE_OTHER): Payer: Medicare HMO | Admitting: Family Medicine

## 2019-12-04 ENCOUNTER — Other Ambulatory Visit: Payer: Self-pay

## 2019-12-04 VITALS — BP 148/90 | HR 75 | Temp 97.9°F | Resp 18 | Ht 71.0 in | Wt 298.4 lb

## 2019-12-04 DIAGNOSIS — Z86718 Personal history of other venous thrombosis and embolism: Secondary | ICD-10-CM

## 2019-12-04 DIAGNOSIS — J449 Chronic obstructive pulmonary disease, unspecified: Secondary | ICD-10-CM | POA: Diagnosis not present

## 2019-12-04 DIAGNOSIS — E785 Hyperlipidemia, unspecified: Secondary | ICD-10-CM | POA: Diagnosis not present

## 2019-12-04 DIAGNOSIS — E1169 Type 2 diabetes mellitus with other specified complication: Secondary | ICD-10-CM

## 2019-12-04 DIAGNOSIS — I10 Essential (primary) hypertension: Secondary | ICD-10-CM | POA: Diagnosis not present

## 2019-12-04 DIAGNOSIS — Z0000 Encounter for general adult medical examination without abnormal findings: Secondary | ICD-10-CM

## 2019-12-04 DIAGNOSIS — F528 Other sexual dysfunction not due to a substance or known physiological condition: Secondary | ICD-10-CM

## 2019-12-04 DIAGNOSIS — E119 Type 2 diabetes mellitus without complications: Secondary | ICD-10-CM

## 2019-12-04 DIAGNOSIS — J301 Allergic rhinitis due to pollen: Secondary | ICD-10-CM

## 2019-12-04 NOTE — Assessment & Plan Note (Signed)
ghm utd Check labs See AVS 

## 2019-12-04 NOTE — Assessment & Plan Note (Signed)
On life long coagulation eliquis is expensive and pt will be in donut hole soon---- he will to elquis pat assistance on line and see if he qualifies  If not we may need to go back to coumadin

## 2019-12-04 NOTE — Assessment & Plan Note (Addendum)
Well controlled, no changes to meds. Encouraged heart healthy diet such as the DASH diet and exercise as tolerated.  Home readings were great

## 2019-12-04 NOTE — Assessment & Plan Note (Signed)
Per u rology 

## 2019-12-04 NOTE — Assessment & Plan Note (Signed)
Controlled with all meds and wearing mask

## 2019-12-04 NOTE — Assessment & Plan Note (Signed)
con't healthy weight and wellness 

## 2019-12-04 NOTE — Patient Instructions (Signed)
Preventive Care 65-65 Years Old, Male Preventive care refers to lifestyle choices and visits with your health care provider that can promote health and wellness. This includes:  A yearly physical exam. This is also called an annual well check.  Regular dental and eye exams.  Immunizations.  Screening for certain conditions.  Healthy lifestyle choices, such as eating a healthy diet, getting regular exercise, not using drugs or products that contain nicotine and tobacco, and limiting alcohol use. What can I expect for my preventive care visit? Physical exam Your health care provider will check:  Height and weight. These may be used to calculate body mass index (BMI), which is a measurement that tells if you are at a healthy weight.  Heart rate and blood pressure.  Your skin for abnormal spots. Counseling Your health care provider may ask you questions about:  Alcohol, tobacco, and drug use.  Emotional well-being.  Home and relationship well-being.  Sexual activity.  Eating habits.  Work and work Statistician. What immunizations do I need?  Influenza (flu) vaccine  This is recommended every year. Tetanus, diphtheria, and pertussis (Tdap) vaccine  You may need a Td booster every 10 years. Varicella (chickenpox) vaccine  You may need this vaccine if you have not already been vaccinated. Zoster (shingles) vaccine  You may need this after age 64. Measles, mumps, and rubella (MMR) vaccine  You may need at least one dose of MMR if you were born in 1957 or later. You may also need a second dose. Pneumococcal conjugate (PCV13) vaccine  You may need this if you have certain conditions and were not previously vaccinated. Pneumococcal polysaccharide (PPSV23) vaccine  You may need one or two doses if you smoke cigarettes or if you have certain conditions. Meningococcal conjugate (MenACWY) vaccine  You may need this if you have certain conditions. Hepatitis A  vaccine  You may need this if you have certain conditions or if you travel or work in places where you may be exposed to hepatitis A. Hepatitis B vaccine  You may need this if you have certain conditions or if you travel or work in places where you may be exposed to hepatitis B. Haemophilus influenzae type b (Hib) vaccine  You may need this if you have certain risk factors. Human papillomavirus (HPV) vaccine  If recommended by your health care provider, you may need three doses over 6 months. You may receive vaccines as individual doses or as more than one vaccine together in one shot (combination vaccines). Talk with your health care provider about the risks and benefits of combination vaccines. What tests do I need? Blood tests  Lipid and cholesterol levels. These may be checked every 5 years, or more frequently if you are over 65 years old.  Hepatitis C test.  Hepatitis B test. Screening  Lung cancer screening. You may have this screening every year starting at age 65 if you have a 30-pack-year history of smoking and currently smoke or have quit within the past 15 years.  Prostate cancer screening. Recommendations will vary depending on your family history and other risks.  Colorectal cancer screening. All adults should have this screening starting at age 65 and continuing until age 65. Your health care provider may recommend screening at age 65 if you are at increased risk. You will have tests every 1-10 years, depending on your results and the type of screening test.  Diabetes screening. This is done by checking your blood sugar (glucose) after you have not eaten  for a while (fasting). You may have this done every 1-3 years.  Sexually transmitted disease (STD) testing. Follow these instructions at home: Eating and drinking  Eat a diet that includes fresh fruits and vegetables, whole grains, lean protein, and low-fat dairy products.  Take vitamin and mineral supplements as  recommended by your health care provider.  Do not drink alcohol if your health care provider tells you not to drink.  If you drink alcohol: ? Limit how much you have to 0-2 drinks a day. ? Be aware of how much alcohol is in your drink. In the [place] one drink equals one 12 oz bottle of beer (355 mL), one 5 oz glass of wine (148 mL), or one 1 oz glass of hard liquor (44 mL). Lifestyle  Take daily care of your teeth and gums.  Stay active. Exercise for at least 30 minutes on 5 or more days each week.  Do not use any products that contain nicotine or tobacco, such as cigarettes, e-cigarettes, and chewing tobacco. If you need help quitting, ask your health care provider.  If you are sexually active, practice safe sex. Use a condom or other form of protection to prevent STIs (sexually transmitted infections).  Talk with your health care provider about taking a low-dose aspirin every day starting at age 65. What's next?  Go to your health care provider once a year for a well check visit.  Ask your health care provider how often you should have your eyes and teeth checked.  Stay up to date on all vaccines. This information is not intended to replace advice given to you by your health care provider. Make sure you discuss any questions you have with your health care provider. Document Revised: 06/27/2018 Document Reviewed: 06/27/2018 Elsevier Patient Education  2020 [employer].

## 2019-12-04 NOTE — Progress Notes (Signed)
Patient ID: Daniel Powers, male    DOB: [birth year]  Age: 64 y.o. MRN: [id number]    Subjective:  Subjective  HPI Daniel Powers presents for cpe---- no complaints except eliquis is very expensive  Review of Systems  Constitutional: Negative.   HENT: Negative for congestion, ear pain, hearing loss, nosebleeds, postnasal drip, rhinorrhea, sinus pressure, sneezing and tinnitus.   Eyes: Negative for photophobia, discharge, itching and visual disturbance.  Respiratory: Negative.   Cardiovascular: Negative.   Gastrointestinal: Negative for abdominal distention, abdominal pain, anal bleeding, blood in stool and constipation.  Endocrine: Negative.   Genitourinary: Negative.   Musculoskeletal: Negative.        R ring finger --- pain that comes and goes quickly no pain now   Skin: Negative.        Discoloration R leg he would like [name] to take a look at   Allergic/Immunologic: Negative.   Neurological: Negative for dizziness, weakness, light-headedness, numbness and headaches.  Psychiatric/Behavioral: Negative for agitation, confusion, decreased concentration, dysphoric mood, sleep disturbance and suicidal ideas. The patient is not nervous/anxious.     History Past Medical History:  Diagnosis Date  . Anxiety   . Arthritis   . Asthma   . COPD (chronic obstructive pulmonary disease) ([place])   . DVT (deep venous thrombosis) ([place])   . Gout   . High cholesterol   . Hypertension   . Influenza with pneumonia   . Lactose intolerance   . Obesity   . Pre-diabetes   . Psychosexual dysfunction with inhibited sexual excitement     He has a past surgical history that includes Tonsillectomy and adenoidectomy and Hand surgery.   His family history includes Alcohol abuse in his father; Alzheimer's disease in his maternal grandmother, mother, and paternal aunt; Cancer in his sister; Esophageal cancer in his maternal uncle; Heart disease (age of onset: 26) in his brother; Hypertension in his father,  maternal grandfather, maternal grandmother, mother, paternal grandfather, and paternal grandmother; Lung cancer in his maternal uncle; Obesity in his father.He reports that he has never smoked. He has never used smokeless tobacco. He reports current alcohol use. He reports that he does not use drugs.  Current Outpatient Medications on File Prior to Visit  Medication Sig Dispense Refill  . albuterol (PROAIR HFA) 108 (90 Base) MCG/ACT inhaler Inhale 2 puffs into the lungs every 6 (six) hours as needed for wheezing. 1 Inhaler 2  . amLODipine (NORVASC) 5 MG tablet TAKE 1 TABLET(5 MG) BY MOUTH DAILY 90 tablet 1  . apixaban (ELIQUIS) 5 MG TABS tablet Take 1 tablet (5 mg total) by mouth 2 (two) times daily. 60 tablet 5  . atorvastatin (LIPITOR) 10 MG tablet TAKE 1 TABLET(10 MG) BY MOUTH DAILY AT 6 PM 90 tablet 1  . cetirizine (ZYRTEC) 10 MG tablet Take 10 mg by mouth daily.    [name] CINNAMON PO Take 1 tablet by mouth daily.    . fish oil-omega-3 fatty acids 1000 MG capsule Take 1 g by mouth daily.    . Flaxseed, Linseed, 1000 MG CAPS Take 1 capsule by mouth daily.    . fluticasone (FLONASE) 50 MCG/ACT nasal spray Place 2 sprays into both nostrils daily. 16 g 6  . olmesartan (BENICAR) 40 MG tablet Take 1 tablet (40 mg total) by mouth daily. 90 tablet 1  . tadalafil (CIALIS) 20 MG tablet Take 20 mg by mouth daily as needed.    . Vitamin D, Ergocalciferol, (DRISDOL) 1.25 MG (50000 UNIT) CAPS  capsule Take 1 capsule (50,000 Units total) by mouth every 7 (seven) days. 4 capsule 0   Current Facility-Administered Medications on File Prior to Visit  Medication Dose Route Frequency Provider Last Rate Last Admin  . 0.9 %  sodium chloride infusion  500 mL Intravenous Continuous Armbruster, [name], MD          Health Maintenance  Topic Date Due  . PNA vac Low Risk Adult (1 of 2 - PCV13) 08/21/2019  . TETANUS/TDAP  09/07/2019  . INFLUENZA VACCINE  02/15/2020  . HEMOGLOBIN A1C  03/18/2020  . FOOT EXAM   06/04/2020  . OPHTHALMOLOGY EXAM  07/17/2020  . COLONOSCOPY  05/01/2022  . COVID-19 Vaccine  Completed  . Hepatitis C Screening  Completed  . HIV Screening  Completed   Objective:  Objective  Physical Exam Vitals and nursing note reviewed.  Constitutional:      General: He is not in acute distress.    Appearance: He is well-developed. He is not diaphoretic.  HENT:     Head: Normocephalic and atraumatic.     Right Ear: External ear normal.     Left Ear: External ear normal.     Nose: Nose normal.     Mouth/Throat:     Pharynx: No oropharyngeal exudate.  Eyes:     General:        Right eye: No discharge.        Left eye: No discharge.     Conjunctiva/sclera: Conjunctivae normal.     Pupils: Pupils are equal, round, and reactive to light.  Neck:     Thyroid: No thyromegaly.     Vascular: No JVD.  Cardiovascular:     Rate and Rhythm: Normal rate and regular rhythm.     Heart sounds: Normal heart sounds. No murmur.  Pulmonary:     Effort: Pulmonary effort is normal. No respiratory distress.     Breath sounds: Normal breath sounds. No wheezing or rales.  Chest:     Chest wall: No tenderness.  Abdominal:     General: Bowel sounds are normal. There is no distension.     Palpations: Abdomen is soft. There is no mass.     Tenderness: There is no abdominal tenderness. There is no guarding or rebound.  Musculoskeletal:        General: No tenderness. Normal range of motion.     Cervical back: Normal range of motion and neck supple.     Comments: No pain palp R ring finger--- full rom   Lymphadenopathy:     Cervical: No cervical adenopathy.  Skin:    General: Skin is warm and dry.     Findings: Lesion present. No erythema or rash.     Comments: R low leg-- shin-- + flat hyperpigmented circular lesion about 2 cm diameter , regular borders  Neurological:     Mental Status: He is alert and oriented to person, place, and time.     Cranial Nerves: No cranial nerve deficit.      Motor: No abnormal muscle tone.     Deep Tendon Reflexes: Reflexes are normal and symmetric. Reflexes normal.  Psychiatric:        Behavior: Behavior normal.        Thought Content: Thought content normal.        Judgment: Judgment normal.    BP (!) 148/90 (BP Location: Left Arm, Patient Position: Sitting, Cuff Size: Large)   Pulse 75   Temp 97.9 F (36.6 C) (  Temporal)   Resp 18   Ht _0  (1.803 m)   Wt 298 lb 6.4 oz (135.4 kg)   SpO2 97%   BMI 41.62 kg/m  Wt Readings from Last 3 Encounters:  12/04/19 298 lb 6.4 oz (135.4 kg)  11/25/19 297 lb (134.7 kg)  11/03/19 298 lb (135.2 kg)     Lab Results  Component Value Date   WBC 6.5 12/25/2018   HGB 14.5 12/25/2018   HCT 45.1 12/25/2018   PLT 221.0 06/28/2017   GLUCOSE 75 09/16/2019   CHOL 116 09/16/2019   TRIG 71.0 09/16/2019   HDL 38.80 (L) 09/16/2019   LDLCALC 63 09/16/2019   ALT 14 09/16/2019   AST 12 09/16/2019   NA 139 09/16/2019   K 4.0 09/16/2019   CL 104 09/16/2019   CREATININE 1.01 09/16/2019   BUN 19 09/16/2019   CO2 30 09/16/2019   TSH 0.920 12/25/2018   PSA 1.31 06/28/2017   INR 1.8 (A) 09/16/2019   HGBA1C 5.6 09/16/2019   MICROALBUR 1.1 06/05/2019    DG Chest 2 View  Result Date: 02/02/2018 CLINICAL DATA:  Chest pain with deep inspiration for the past 2 months. EXAM: CHEST - 2 VIEW COMPARISON:  Chest x-ray dated February 05, 2012. FINDINGS: The heart size and mediastinal contours are within normal limits. Normal pulmonary vascularity. No focal consolidation, pleural effusion, or pneumothorax. No acute osseous abnormality. IMPRESSION: No active cardiopulmonary disease. Electronically Signed   By: Titus Dubin M.D.   On: 02/02/2018 09:39   DG Thoracic Spine 2 View  Result Date: 02/02/2018 CLINICAL DATA:  Back pain with deep inspiration over the past 2 months. EXAM: THORACIC SPINE 2 VIEWS COMPARISON:  Chest x-ray dated February 05, 2012. FINDINGS: Twelve rib-bearing thoracic vertebral bodies. No acute  fracture or subluxation. Vertebral body heights are preserved. Alignment is normal. Flowing anterior endplate osteophytes, consistent with DISH. Intervertebral disc spaces are maintained. IMPRESSION: 1.  No acute osseous abnormality. 2. Findings consistent with DISH. Electronically Signed   By: Titus Dubin M.D.   On: 02/02/2018 09:41     Assessment & Plan:  Plan  I have discontinued [name] R. Pelphrey's blood glucose meter kit and supplies. I am also having him maintain his fish oil-omega-3 fatty acids, tadalafil, Flaxseed (Linseed), cetirizine, fluticasone, CINNAMON PO, albuterol, amLODipine, atorvastatin, olmesartan, apixaban, and Vitamin D (Ergocalciferol). We will continue to administer sodium chloride.  No orders of the defined types were placed in this encounter.   Problem List Items Addressed This Visit      Unprioritized   Allergic rhinitis    Controlled with all meds and wearing mask       Diet-controlled diabetes mellitus ([place])    con't diet and exercise Stable      DVT, HX OF    On life long coagulation eliquis is expensive and pt will be in donut hole soon---- he will to elquis pat assistance on line and see if he qualifies  If not we may need to go back to coumadin      ERECTILE DYSFUNCTION    Per urology      Essential hypertension - Primary    Well controlled, no changes to meds. Encouraged heart healthy diet such as the DASH diet and exercise as tolerated.       Hyperlipidemia   Hyperlipidemia associated with type 2 diabetes mellitus ([place])    Encouraged heart healthy diet, increase exercise, avoid trans fats, consider a krill oil cap daily  Morbid obesity ([place])    con't healthy weight and wellness      Preventative health care    ghm utd Check labs  See AVS         Follow-up: Return in about 6 months (around 06/05/2020), or if symptoms worsen or fail to improve, for hypertension, hyperlipidemia, diabetes II.  Ann Held, DO

## 2019-12-04 NOTE — Assessment & Plan Note (Signed)
con't diet and exercise Stable

## 2019-12-04 NOTE — Assessment & Plan Note (Signed)
Encouraged heart healthy diet, increase exercise, avoid trans fats, consider a krill oil cap daily 

## 2019-12-17 ENCOUNTER — Ambulatory Visit (INDEPENDENT_AMBULATORY_CARE_PROVIDER_SITE_OTHER): Payer: Medicare HMO | Admitting: Physician Assistant

## 2019-12-17 ENCOUNTER — Encounter (INDEPENDENT_AMBULATORY_CARE_PROVIDER_SITE_OTHER): Payer: Self-pay | Admitting: Physician Assistant

## 2019-12-17 ENCOUNTER — Other Ambulatory Visit (INDEPENDENT_AMBULATORY_CARE_PROVIDER_SITE_OTHER): Payer: Self-pay | Admitting: Physician Assistant

## 2019-12-17 ENCOUNTER — Other Ambulatory Visit: Payer: Self-pay

## 2019-12-17 VITALS — BP 144/87 | HR 72 | Temp 98.5°F | Ht 71.0 in | Wt 299.0 lb

## 2019-12-17 DIAGNOSIS — I10 Essential (primary) hypertension: Secondary | ICD-10-CM

## 2019-12-17 DIAGNOSIS — Z6841 Body Mass Index (BMI) 40.0 and over, adult: Secondary | ICD-10-CM

## 2019-12-17 DIAGNOSIS — E1159 Type 2 diabetes mellitus with other circulatory complications: Secondary | ICD-10-CM

## 2019-12-17 DIAGNOSIS — E559 Vitamin D deficiency, unspecified: Secondary | ICD-10-CM

## 2019-12-17 MED ORDER — AMLODIPINE BESYLATE 5 MG PO TABS: ORAL_TABLET | ORAL | 0 refills | Status: DC

## 2019-12-17 MED ORDER — VITAMIN D (ERGOCALCIFEROL) 1.25 MG (50000 UNIT) PO CAPS: 50000.0000 [IU] | ORAL_CAPSULE | ORAL | 0 refills | Status: DC

## 2019-12-18 NOTE — Progress Notes (Signed)
Chief Complaint:   OBESITY Daniel Powers is here to discuss his progress with his obesity treatment plan along with follow-up of his obesity related diagnoses. Daniel Powers is on the Category 4 Plan and states he is following his eating plan approximately 75% of the time. Daniel Powers states he is walking 60 minutes 2 times per week.  Today's visit was #: 61 Starting weight: 309 lbs Starting date: 12/25/2018 Today's weight: 299 lbs Today's date: 12/17/2019 Total lbs lost to date: 10 Total lbs lost since last in-office visit: 0  Interim History: Daniel Powers states that he continues to skip lunch 2-3 days out of the week. He eats out often for breakfast and dinner.  Subjective:   Vitamin D deficiency. Daniel Powers is on prescription Vitamin D weekly. No nausea, vomiting, or muscle weakness. Last Vitamin D was 35.33 on 09/16/2019.  Hypertension associated with type 2 diabetes mellitus ([place]). Daniel Powers is on no medication. No polyphagia. He is not checking his blood sugars.   Lab Results  Component Value Date   HGBA1C 5.6 09/16/2019   HGBA1C 5.8 06/05/2019   HGBA1C 5.7 (H) 12/25/2018   Lab Results  Component Value Date   MICROALBUR 1.1 06/05/2019   LDLCALC 63 09/16/2019   CREATININE 1.01 09/16/2019   Lab Results  Component Value Date   INSULIN 9.5 12/25/2018   Essential hypertension. Daniel Powers is on amlodipine, but has been out of it for 2 days. He is also on Benicar.  BP Readings from Last 3 Encounters:  12/17/19 (!) 144/87  12/04/19 (!) 148/90  11/25/19 (!) 143/82   Lab Results  Component Value Date   CREATININE 1.01 09/16/2019   CREATININE 0.94 06/05/2019   CREATININE 1.06 12/25/2018   Assessment/Plan:   Vitamin D deficiency. Low Vitamin D level contributes to fatigue and are associated with obesity, breast, and colon cancer. He was given a refill on his Vitamin D, Ergocalciferol, (DRISDOL) 1.25 MG (50000 UNIT) CAPS capsule every week #4 with 0 refills and will follow-up for routine  testing of Vitamin D, at least 2-3 times per year to avoid over-replacement.   Hypertension associated with type 2 diabetes mellitus ([place]). Good blood sugar control is important to decrease the likelihood of diabetic complications such as nephropathy, neuropathy, limb loss, blindness, coronary artery disease, and death. Intensive lifestyle modification including diet, exercise and weight loss are the first line of treatment for diabetes.   Essential hypertension. Daniel Powers is working on healthy weight loss and exercise to improve blood pressure control. We will watch for signs of hypotension as he continues his lifestyle modifications. Refill was given for amLODipine (NORVASC) 5 MG tablet #30 with 0 refills.  Class 3 severe obesity with serious comorbidity and body mass index (BMI) of 40.0 to 44.9 in adult, unspecified obesity type (Daniel Powers).  Daniel Powers is currently in the action stage of change. As such, his goal is to continue with weight loss efforts. He has agreed to the Category 4 Plan.   Exercise goals: All adults should avoid inactivity. Some physical activity is better than none, and adults who participate in any amount of physical activity gain some health benefits.  Behavioral modification strategies: no skipping meals and meal planning and cooking strategies.  Daniel Powers has agreed to follow-up with our clinic in 3 weeks. He was informed of the importance of frequent follow-up visits to maximize his success with intensive lifestyle modifications for his multiple health conditions.   Objective:   Blood pressure (!) 144/87, pulse 72, temperature 98.5 F (  36.9 C), temperature source Oral, height 5\' 11"  (1.803 m), weight 299 lb (135.6 kg), SpO2 97 %. Body mass index is 41.7 kg/m.  General: Cooperative, alert, well developed, in no acute distress. HEENT: Conjunctivae and lids unremarkable. Cardiovascular: Regular rhythm.  Lungs: Normal work of breathing. Neurologic: No focal deficits.   Lab  Results  Component Value Date   CREATININE 1.01 09/16/2019   BUN 19 09/16/2019   NA 139 09/16/2019   K 4.0 09/16/2019   CL 104 09/16/2019   CO2 30 09/16/2019   Lab Results  Component Value Date   ALT 14 09/16/2019   AST 12 09/16/2019   ALKPHOS 41 09/16/2019   BILITOT 0.6 09/16/2019   Lab Results  Component Value Date   HGBA1C 5.6 09/16/2019   HGBA1C 5.8 06/05/2019   HGBA1C 5.7 (H) 12/25/2018   HGBA1C 6.1 08/05/2018   HGBA1C 6.2 02/01/2018   Lab Results  Component Value Date   INSULIN 9.5 12/25/2018   Lab Results  Component Value Date   TSH 0.920 12/25/2018   Lab Results  Component Value Date   CHOL 116 09/16/2019   HDL 38.80 (L) 09/16/2019   LDLCALC 63 09/16/2019   TRIG 71.0 09/16/2019   CHOLHDL 3 09/16/2019   Lab Results  Component Value Date   WBC 6.5 12/25/2018   HGB 14.5 12/25/2018   HCT 45.1 12/25/2018   MCV 84 12/25/2018   PLT 221.0 06/28/2017   No results found for: IRON, TIBC, FERRITIN  Obesity Behavioral Intervention Documentation for Insurance:   Approximately 15 minutes were spent on the discussion below.  ASK: We discussed the diagnosis of obesity with Daniel Powers today and Daniel Powers agreed to give [name] permission to discuss obesity behavioral modification therapy today.  ASSESS: [name] has the diagnosis of obesity and his BMI today is 41.7. Daniel Powers is in the action stage of change.   ADVISE: Daniel Powers was educated on the multiple health risks of obesity as well as the benefit of weight loss to improve his health. He was advised of the need for long term treatment and the importance of lifestyle modifications to improve his current health and to decrease his risk of future health problems.  AGREE: Multiple dietary modification options and treatment options were discussed and Daniel Powers agreed to follow the recommendations documented in the above note.  ARRANGE: Daniel Powers was educated on the importance of frequent visits to treat obesity as outlined per CMS  and USPSTF guidelines and agreed to schedule his next follow up appointment today.  Attestation Statements:   Reviewed by clinician on day of visit: allergies, medications, problem list, medical history, surgical history, family history, social history, and previous encounter notes.  Daniel Powers, am acting as transcriptionist for Daniel Potash, PA-C   I have reviewed the above documentation for accuracy and completeness, and I agree with the above. Daniel Potash, PA-C

## 2019-12-22 ENCOUNTER — Other Ambulatory Visit: Payer: Self-pay

## 2019-12-22 DIAGNOSIS — E785 Hyperlipidemia, unspecified: Secondary | ICD-10-CM

## 2019-12-22 MED ORDER — ATORVASTATIN CALCIUM 10 MG PO TABS: ORAL_TABLET | ORAL | 1 refills | Status: DC

## 2020-01-07 ENCOUNTER — Encounter (INDEPENDENT_AMBULATORY_CARE_PROVIDER_SITE_OTHER): Payer: Self-pay | Admitting: Physician Assistant

## 2020-01-07 ENCOUNTER — Other Ambulatory Visit: Payer: Self-pay

## 2020-01-07 ENCOUNTER — Ambulatory Visit (INDEPENDENT_AMBULATORY_CARE_PROVIDER_SITE_OTHER): Payer: Medicare HMO | Admitting: Physician Assistant

## 2020-01-07 VITALS — BP 142/81 | HR 74 | Temp 98.5°F | Ht 71.0 in | Wt 295.0 lb

## 2020-01-07 DIAGNOSIS — I10 Essential (primary) hypertension: Secondary | ICD-10-CM | POA: Diagnosis not present

## 2020-01-07 DIAGNOSIS — E66813 Obesity, class 3: Secondary | ICD-10-CM

## 2020-01-07 DIAGNOSIS — Z6841 Body Mass Index (BMI) 40.0 and over, adult: Secondary | ICD-10-CM | POA: Diagnosis not present

## 2020-01-07 DIAGNOSIS — E559 Vitamin D deficiency, unspecified: Secondary | ICD-10-CM | POA: Diagnosis not present

## 2020-01-07 MED ORDER — VITAMIN D (ERGOCALCIFEROL) 1.25 MG (50000 UNIT) PO CAPS: 50000.0000 [IU] | ORAL_CAPSULE | ORAL | 0 refills | Status: DC

## 2020-01-08 ENCOUNTER — Other Ambulatory Visit (INDEPENDENT_AMBULATORY_CARE_PROVIDER_SITE_OTHER): Payer: Self-pay | Admitting: Physician Assistant

## 2020-01-08 DIAGNOSIS — E559 Vitamin D deficiency, unspecified: Secondary | ICD-10-CM

## 2020-01-08 NOTE — Progress Notes (Signed)
Chief Complaint:   OBESITY Daniel Powers is here to discuss his progress with his obesity treatment plan along with follow-up of his obesity related diagnoses. Daniel Powers is on the Category 4 Plan and states he is following his eating plan approximately 75% of the time. Daniel Powers states he is exercising for 0 minutes 0 times per week.  Today's visit was #: 20 Starting weight: 309 lbs Starting date: 12/25/2018 Today's weight: 295 lbs Today's date: 01/07/2020 Total lbs lost to date: 14 lbs Total lbs lost since last in-office visit: 4 lbs  Interim History: Daniel Powers states he is getting used to eating better.  He is making better choices overall.  He notes that he is eating less simple carbs.  Subjective:   1. Vitamin D deficiency Daniel Powers's Vitamin D level was 35.33 on 09/16/2019. He is currently taking prescription vitamin D 50,000 IU each week. He denies nausea, vomiting or muscle weakness.  2. Essential hypertension Daniel Powers is taking Norvasc and Benicar for blood pressure control.  No headaches or chest pain.  BP Readings from Last 3 Encounters:  01/07/20 (!) 142/81  12/17/19 (!) 144/87  12/04/19 (!) 148/90   Assessment/Plan:   1. Vitamin D deficiency Low Vitamin D level contributes to fatigue and are associated with obesity, breast, and colon cancer. He agrees to continue to take prescription Vitamin D @50 ,000 IU every week and will follow-up for routine testing of Vitamin D, at least 2-3 times per year to avoid over-replacement. - Vitamin D, Ergocalciferol, (DRISDOL) 1.25 MG (50000 UNIT) CAPS capsule; Take 1 capsule (50,000 Units total) by mouth every 7 (seven) days.  Dispense: 4 capsule; Refill: 0  2. Essential hypertension Daniel Powers is working on healthy weight loss and exercise to improve blood pressure control. We will watch for signs of hypotension as he continues his lifestyle modifications.  3. Class 3 severe obesity with serious comorbidity and body mass index (BMI) of 40.0 to 44.9 in  adult, unspecified obesity type Daniel Powers) Daniel Powers is currently in the action stage of change. As such, his goal is to continue with weight loss efforts. He has agreed to the Category 4 Plan.   Exercise goals: Daniel Powers should follow the adult guidelines. When Daniel Powers cannot meet the adult guidelines, they should be as physically active as their abilities and conditions will allow.  Daniel Powers should do exercises that maintain or improve balance if they are at risk of falling.   Behavioral modification strategies: meal planning and cooking strategies and keeping healthy foods in the home.  Daniel Powers has agreed to follow-up with our clinic in 4 weeks. He was informed of the importance of frequent follow-up visits to maximize his success with intensive lifestyle modifications for his multiple health conditions.   Objective:   Blood pressure (!) 142/81, pulse 74, temperature 98.5 F (36.9 C), temperature source Oral, height 5\' 11"  (1.803 m), weight 295 lb (133.8 kg), SpO2 98 %. Body mass index is 41.14 kg/m.  General: Cooperative, alert, well developed, in no acute distress. HEENT: Conjunctivae and lids unremarkable. Cardiovascular: Regular rhythm.  Lungs: Normal work of breathing. Neurologic: No focal deficits.   Lab Results  Component Value Date   CREATININE 1.01 09/16/2019   BUN 19 09/16/2019   NA 139 09/16/2019   K 4.0 09/16/2019   CL 104 09/16/2019   CO2 30 09/16/2019   Lab Results  Component Value Date   ALT 14 09/16/2019   AST 12 09/16/2019   ALKPHOS 41 09/16/2019   BILITOT 0.6  09/16/2019   Lab Results  Component Value Date   HGBA1C 5.6 09/16/2019   HGBA1C 5.8 06/05/2019   HGBA1C 5.7 (H) 12/25/2018   HGBA1C 6.1 08/05/2018   HGBA1C 6.2 02/01/2018   Lab Results  Component Value Date   INSULIN 9.5 12/25/2018   Lab Results  Component Value Date   TSH 0.920 12/25/2018   Lab Results  Component Value Date   CHOL 116 09/16/2019   HDL 38.80 (L) 09/16/2019    LDLCALC 63 09/16/2019   TRIG 71.0 09/16/2019   CHOLHDL 3 09/16/2019   Lab Results  Component Value Date   WBC 6.5 12/25/2018   HGB 14.5 12/25/2018   HCT 45.1 12/25/2018   MCV 84 12/25/2018   PLT 221.0 06/28/2017   Obesity Behavioral Intervention Documentation for Insurance:   Approximately 15 minutes were spent on the discussion below.  ASK: We discussed the diagnosis of obesity with Daniel Powers today and Daniel Powers agreed to give [name] permission to discuss obesity behavioral modification therapy today.  ASSESS: Daniel Powers has the diagnosis of obesity and his BMI today is 41.2. Daniel Powers is in the action stage of change.   ADVISE: Daniel Powers was educated on the multiple health risks of obesity as well as the benefit of weight loss to improve his health. He was advised of the need for long term treatment and the importance of lifestyle modifications to improve his current health and to decrease his risk of future health problems.  AGREE: Multiple dietary modification options and treatment options were discussed and Daniel Powers agreed to follow the recommendations documented in the above note.  ARRANGE: Daniel Powers was educated on the importance of frequent visits to treat obesity as outlined per CMS and USPSTF guidelines and agreed to schedule his next follow up appointment today.  Attestation Statements:   Reviewed by clinician on day of visit: allergies, medications, problem list, medical history, surgical history, family history, social history, and previous encounter notes.  I, Water quality scientist, CMA, am acting as transcriptionist for Abby Potash, PA-C  I have reviewed the above documentation for accuracy and completeness, and I agree with the above. Abby Potash, PA-C

## 2020-01-22 ENCOUNTER — Other Ambulatory Visit (INDEPENDENT_AMBULATORY_CARE_PROVIDER_SITE_OTHER): Payer: Self-pay | Admitting: Physician Assistant

## 2020-01-22 DIAGNOSIS — I10 Essential (primary) hypertension: Secondary | ICD-10-CM

## 2020-01-26 ENCOUNTER — Other Ambulatory Visit (INDEPENDENT_AMBULATORY_CARE_PROVIDER_SITE_OTHER): Payer: Self-pay | Admitting: Physician Assistant

## 2020-01-26 ENCOUNTER — Other Ambulatory Visit (INDEPENDENT_AMBULATORY_CARE_PROVIDER_SITE_OTHER): Payer: Self-pay | Admitting: Family Medicine

## 2020-01-26 ENCOUNTER — Encounter: Payer: Self-pay | Admitting: Family Medicine

## 2020-01-26 DIAGNOSIS — I10 Essential (primary) hypertension: Secondary | ICD-10-CM

## 2020-01-26 MED ORDER — AMLODIPINE BESYLATE 5 MG PO TABS: ORAL_TABLET | ORAL | 0 refills | Status: DC

## 2020-01-27 MED ORDER — AMLODIPINE BESYLATE 5 MG PO TABS: ORAL_TABLET | ORAL | 1 refills | Status: DC

## 2020-02-04 ENCOUNTER — Other Ambulatory Visit: Payer: Self-pay

## 2020-02-04 ENCOUNTER — Ambulatory Visit (INDEPENDENT_AMBULATORY_CARE_PROVIDER_SITE_OTHER): Payer: Medicare HMO | Admitting: Physician Assistant

## 2020-02-04 ENCOUNTER — Encounter: Payer: Self-pay | Admitting: Family Medicine

## 2020-02-04 ENCOUNTER — Encounter (INDEPENDENT_AMBULATORY_CARE_PROVIDER_SITE_OTHER): Payer: Self-pay | Admitting: Physician Assistant

## 2020-02-04 VITALS — BP 126/79 | HR 77 | Temp 98.2°F | Ht 71.0 in | Wt 297.0 lb

## 2020-02-04 DIAGNOSIS — Z6841 Body Mass Index (BMI) 40.0 and over, adult: Secondary | ICD-10-CM | POA: Diagnosis not present

## 2020-02-04 DIAGNOSIS — E1169 Type 2 diabetes mellitus with other specified complication: Secondary | ICD-10-CM

## 2020-02-04 DIAGNOSIS — E559 Vitamin D deficiency, unspecified: Secondary | ICD-10-CM | POA: Diagnosis not present

## 2020-02-04 DIAGNOSIS — E785 Hyperlipidemia, unspecified: Secondary | ICD-10-CM

## 2020-02-04 NOTE — Progress Notes (Signed)
Chief Complaint:   OBESITY Daniel Powers is here to discuss his progress with his obesity treatment plan along with follow-up of his obesity related diagnoses. [name] is on the Category 4 Plan and states he is following his eating plan approximately 75% of the time. [name] states he is exercising 0 minutes 0 times per week.  Today's visit was #: 21 Starting weight: 309 lbs Starting date: 12/25/2018 Today's weight: 297 lbs Today's date: 02/04/2020 Total lbs lost to date: 12 Total lbs lost since last in-office visit: 0  Interim History: [name] states that he was doing well with his plan until he got a craving for Cinn[place]. He got four of them and ate one every night for 4 nights.  Subjective:   Type 2 diabetes mellitus with hyperlipidemia ([place]). [name] is on no medication for diabetes. He is on Lipitor for hyperlipidemia. He is due for labs.  Lab Results  Component Value Date   HGBA1C 5.6 09/16/2019   HGBA1C 5.8 06/05/2019   HGBA1C 5.7 (H) 12/25/2018   Lab Results  Component Value Date   MICROALBUR 1.1 06/05/2019   LDLCALC 63 09/16/2019   CREATININE 1.01 09/16/2019   Lab Results  Component Value Date   INSULIN 9.5 12/25/2018   Vitamin D deficiency. [name] is on prescription Vitamin D supplementation. No nausea, vomiting, or muscle weakness.    Ref. Range 09/16/2019 13:50  VITD Latest Ref Range: 30.00 - 100.00 ng/mL 35.33   Assessment/Plan:   Type 2 diabetes mellitus with hyperlipidemia ([place]). Good blood sugar control is important to decrease the likelihood of diabetic complications such as nephropathy, neuropathy, limb loss, blindness, coronary artery disease, and death. Intensive lifestyle modification including diet, exercise and weight loss are the first line of treatment for diabetes. Comprehensive metabolic panel, Hemoglobin A1c, Insulin, random, Lipid panel labs will be checked today.   Vitamin D deficiency. Low Vitamin D level contributes to fatigue and are  associated with obesity, breast, and colon cancer. He was given a refill on his prescription Vitamin D @50 ,000 IU every week #4 with 0 refills and VITAMIN D 25 Hydroxy (Vit-D Deficiency, Fractures) level will be checked today.  Class 3 severe obesity with serious comorbidity and body mass index (BMI) of 40.0 to 44.9 in adult, unspecified obesity type ([place]).  [name] is currently in the action stage of change. As such, his goal is to continue with weight loss efforts. He has agreed to the Category 4 Plan.   Exercise goals: Older adults should follow the adult guidelines. When older adults cannot meet the adult guidelines, they should be as physically active as their abilities and conditions will allow.   Behavioral modification strategies: meal planning and cooking strategies and keeping healthy foods in the home.  [name] has agreed to follow-up with our clinic in 3 weeks. He was informed of the importance of frequent follow-up visits to maximize his success with intensive lifestyle modifications for his multiple health conditions.   [name] was informed we would discuss his lab results at his next visit unless there is a critical issue that needs to be addressed sooner. [name] agreed to keep his next visit at the agreed upon time to discuss these results.  Objective:   Blood pressure 126/79, pulse 77, temperature 98.2 F (36.8 C), temperature source Oral, height 5\' 11"  (1.803 m), weight 297 lb (134.7 kg), SpO2 98 %. Body mass index is 41.42 kg/m.  General: Cooperative, alert, well developed, in no acute distress. HEENT: Conjunctivae and  lids unremarkable. Cardiovascular: Regular rhythm.  Lungs: Normal work of breathing. Neurologic: No focal deficits.   Lab Results  Component Value Date   CREATININE 1.01 09/16/2019   BUN 19 09/16/2019   NA 139 09/16/2019   K 4.0 09/16/2019   CL 104 09/16/2019   CO2 30 09/16/2019   Lab Results  Component Value Date   ALT 14 09/16/2019   AST 12  09/16/2019   ALKPHOS 41 09/16/2019   BILITOT 0.6 09/16/2019   Lab Results  Component Value Date   HGBA1C 5.6 09/16/2019   HGBA1C 5.8 06/05/2019   HGBA1C 5.7 (H) 12/25/2018   HGBA1C 6.1 08/05/2018   HGBA1C 6.2 02/01/2018   Lab Results  Component Value Date   INSULIN 9.5 12/25/2018   Lab Results  Component Value Date   TSH 0.920 12/25/2018   Lab Results  Component Value Date   CHOL 116 09/16/2019   HDL 38.80 (L) 09/16/2019   LDLCALC 63 09/16/2019   TRIG 71.0 09/16/2019   CHOLHDL 3 09/16/2019   Lab Results  Component Value Date   WBC 6.5 12/25/2018   HGB 14.5 12/25/2018   HCT 45.1 12/25/2018   MCV 84 12/25/2018   PLT 221.0 06/28/2017   No results found for: IRON, TIBC, FERRITIN  Obesity Behavioral Intervention Documentation for Insurance:   Approximately 15 minutes were spent on the discussion below.  ASK: We discussed the diagnosis of obesity with Daniel Powers today and Teryl agreed to give [name] permission to discuss obesity behavioral modification therapy today.  ASSESS: [name] has the diagnosis of obesity and his BMI today is 41.5. [name] is in the action stage of change.   ADVISE: Daniel Powers was educated on the multiple health risks of obesity as well as the benefit of weight loss to improve his health. He was advised of the need for long term treatment and the importance of lifestyle modifications to improve his current health and to decrease his risk of future health problems.  AGREE: Multiple dietary modification options and treatment options were discussed and Elo agreed to follow the recommendations documented in the above note.  ARRANGE: Daniel Powers was educated on the importance of frequent visits to treat obesity as outlined per CMS and USPSTF guidelines and agreed to schedule his next follow up appointment today.  Attestation Statements:   Reviewed by clinician on day of visit: allergies, medications, problem list, medical history, surgical history, family  history, social history, and previous encounter notes.  IMichaelene Powers, am acting as transcriptionist for Abby Potash, PA-C   I have reviewed the above documentation for accuracy and completeness, and I agree with the above. Abby Potash, PA-C

## 2020-02-05 LAB — COMPREHENSIVE METABOLIC PANEL
ALT: 13 IU/L (ref 0–44)
AST: 11 IU/L (ref 0–40)
Albumin/Globulin Ratio: 1.7 (ref 1.2–2.2)
Albumin: 4.2 g/dL (ref 3.8–4.8)
Alkaline Phosphatase: 46 IU/L — ABNORMAL LOW (ref 48–121)
BUN/Creatinine Ratio: 19 (ref 10–24)
BUN: 18 mg/dL (ref 8–27)
Bilirubin Total: 0.7 mg/dL (ref 0.0–1.2)
CO2: 24 mmol/L (ref 20–29)
Calcium: 9.2 mg/dL (ref 8.6–10.2)
Chloride: 106 mmol/L (ref 96–106)
Creatinine, Ser: 0.97 mg/dL (ref 0.76–1.27)
GFR calc Af Amer: 94 mL/min/{1.73_m2} (ref >59)
GFR calc non Af Amer: 82 mL/min/{1.73_m2} (ref >59)
Globulin, Total: 2.5 g/dL (ref 1.5–4.5)
Glucose: 95 mg/dL (ref 65–99)
Potassium: 4.3 mmol/L (ref 3.5–5.2)
Sodium: 141 mmol/L (ref 134–144)
Total Protein: 6.7 g/dL (ref 6.0–8.5)

## 2020-02-05 LAB — HEMOGLOBIN A1C
Est. average glucose Bld gHb Est-mCnc: 120 mg/dL
Hgb A1c MFr Bld: 5.8 % — ABNORMAL HIGH (ref 4.8–5.6)

## 2020-02-05 LAB — LIPID PANEL
Chol/HDL Ratio: 3 ratio (ref 0.0–5.0)
Cholesterol, Total: 132 mg/dL (ref 100–199)
HDL: 44 mg/dL (ref >39)
LDL Chol Calc (NIH): 74 mg/dL (ref 0–99)
Triglycerides: 65 mg/dL (ref 0–149)
VLDL Cholesterol Cal: 14 mg/dL (ref 5–40)

## 2020-02-05 LAB — INSULIN, RANDOM: INSULIN: 6.7 u[IU]/mL (ref 2.6–24.9)

## 2020-02-05 LAB — VITAMIN D 25 HYDROXY (VIT D DEFICIENCY, FRACTURES): Vit D, 25-Hydroxy: 40.1 ng/mL (ref 30.0–100.0)

## 2020-02-07 ENCOUNTER — Other Ambulatory Visit (INDEPENDENT_AMBULATORY_CARE_PROVIDER_SITE_OTHER): Payer: Self-pay | Admitting: Physician Assistant

## 2020-02-07 DIAGNOSIS — E559 Vitamin D deficiency, unspecified: Secondary | ICD-10-CM

## 2020-02-26 ENCOUNTER — Other Ambulatory Visit (INDEPENDENT_AMBULATORY_CARE_PROVIDER_SITE_OTHER): Payer: Self-pay | Admitting: Family Medicine

## 2020-02-26 ENCOUNTER — Other Ambulatory Visit (INDEPENDENT_AMBULATORY_CARE_PROVIDER_SITE_OTHER): Payer: Self-pay | Admitting: Physician Assistant

## 2020-02-26 ENCOUNTER — Ambulatory Visit (INDEPENDENT_AMBULATORY_CARE_PROVIDER_SITE_OTHER): Payer: Medicare HMO | Admitting: Physician Assistant

## 2020-02-26 ENCOUNTER — Encounter (INDEPENDENT_AMBULATORY_CARE_PROVIDER_SITE_OTHER): Payer: Self-pay | Admitting: Physician Assistant

## 2020-02-26 ENCOUNTER — Other Ambulatory Visit: Payer: Self-pay

## 2020-02-26 VITALS — BP 129/77 | HR 73 | Temp 98.0°F | Ht 71.0 in | Wt 298.0 lb

## 2020-02-26 DIAGNOSIS — E785 Hyperlipidemia, unspecified: Secondary | ICD-10-CM

## 2020-02-26 DIAGNOSIS — E559 Vitamin D deficiency, unspecified: Secondary | ICD-10-CM

## 2020-02-26 DIAGNOSIS — I10 Essential (primary) hypertension: Secondary | ICD-10-CM

## 2020-02-26 DIAGNOSIS — E1169 Type 2 diabetes mellitus with other specified complication: Secondary | ICD-10-CM | POA: Diagnosis not present

## 2020-02-26 DIAGNOSIS — Z6841 Body Mass Index (BMI) 40.0 and over, adult: Secondary | ICD-10-CM

## 2020-02-26 MED ORDER — VITAMIN D (ERGOCALCIFEROL) 1.25 MG (50000 UNIT) PO CAPS: 50000.0000 [IU] | ORAL_CAPSULE | ORAL | 0 refills | Status: DC

## 2020-02-29 ENCOUNTER — Other Ambulatory Visit: Payer: Self-pay | Admitting: Family Medicine

## 2020-02-29 DIAGNOSIS — I10 Essential (primary) hypertension: Secondary | ICD-10-CM

## 2020-03-01 NOTE — Progress Notes (Signed)
Chief Complaint:   OBESITY Daniel Powers is here to discuss his progress with his obesity treatment plan along with follow-up of his obesity related diagnoses. [name] is on the Category 4 Plan and states he is following his eating plan approximately 75% of the time. [name] states he is exercising 0 minutes 0 times per week.  Today's visit was #: 22 Starting weight: 309 lbs Starting date: 12/25/2018 Today's weight: 298 lbs Today's date: 02/26/2020 Total lbs lost to date: 11 Total lbs lost since last in-office visit: 0  Interim History: [name] states that he is making good choices with his food, but that he is not eating enough. He is not yet exercising.  Subjective:   Type 2 diabetes mellitus with hyperlipidemia ([place]). [name] is on no medication. No polyphagia.  Lab Results  Component Value Date   HGBA1C 5.8 (H) 02/04/2020   HGBA1C 5.6 09/16/2019   HGBA1C 5.8 06/05/2019   Lab Results  Component Value Date   MICROALBUR 1.1 06/05/2019   LDLCALC 74 02/04/2020   CREATININE 0.97 02/04/2020   Lab Results  Component Value Date   INSULIN 6.7 02/04/2020   INSULIN 9.5 12/25/2018   Vitamin D deficiency. Anchor is on Vitamin D. Last level was not at goal.   Ref. Range 02/04/2020 13:21  Vitamin D, 25-Hydroxy Latest Ref Range: 30.0 - 100.0 ng/mL 40.1   Assessment/Plan:   Type 2 diabetes mellitus with hyperlipidemia ([place]). Good blood sugar control is important to decrease the likelihood of diabetic complications such as nephropathy, neuropathy, limb loss, blindness, coronary artery disease, and death. Intensive lifestyle modification including diet, exercise and weight loss are the first line of treatment for diabetes.   Vitamin D deficiency. Low Vitamin D level contributes to fatigue and are associated with obesity, breast, and colon cancer. He will increase his Vitamin D, Ergocalciferol, (DRISDOL) 1.25 MG (50000 UNIT) CAPS capsule to twice weekly #10 with 0 refills and will  follow-up for routine testing of Vitamin D, at least 2-3 times per year to avoid over-replacement.   Class 3 severe obesity with serious comorbidity and body mass index (BMI) of 40.0 to 44.9 in adult, unspecified obesity type ([place]).  Vue is currently in the action stage of change. As such, his goal is to continue with weight loss efforts. He has agreed to the Category 4 Plan.   We discussed GLP-1 today and patient declines.   Exercise goals: Older adults should follow the adult guidelines. When older adults cannot meet the adult guidelines, they should be as physically active as their abilities and conditions will allow.   Behavioral modification strategies: meal planning and cooking strategies and keeping healthy foods in the home.  [name] has agreed to follow-up with our clinic in 3 weeks. He was informed of the importance of frequent follow-up visits to maximize his success with intensive lifestyle modifications for his multiple health conditions.   Objective:   Blood pressure 129/77, pulse 73, temperature 98 F (36.7 C), temperature source Oral, height 5\' 11"  (1.803 m), weight 298 lb (135.2 kg), SpO2 99 %. Body mass index is 41.56 kg/m.  General: Cooperative, alert, well developed, in no acute distress. HEENT: Conjunctivae and lids unremarkable. Cardiovascular: Regular rhythm.  Lungs: Normal work of breathing. Neurologic: No focal deficits.   Lab Results  Component Value Date   CREATININE 0.97 02/04/2020   BUN 18 02/04/2020   NA 141 02/04/2020   K 4.3 02/04/2020   CL 106 02/04/2020   CO2 24  02/04/2020   Lab Results  Component Value Date   ALT 13 02/04/2020   AST 11 02/04/2020   ALKPHOS 46 (L) 02/04/2020   BILITOT 0.7 02/04/2020   Lab Results  Component Value Date   HGBA1C 5.8 (H) 02/04/2020   HGBA1C 5.6 09/16/2019   HGBA1C 5.8 06/05/2019   HGBA1C 5.7 (H) 12/25/2018   HGBA1C 6.1 08/05/2018   Lab Results  Component Value Date   INSULIN 6.7 02/04/2020    INSULIN 9.5 12/25/2018   Lab Results  Component Value Date   TSH 0.920 12/25/2018   Lab Results  Component Value Date   CHOL 132 02/04/2020   HDL 44 02/04/2020   LDLCALC 74 02/04/2020   TRIG 65 02/04/2020   CHOLHDL 3.0 02/04/2020   Lab Results  Component Value Date   WBC 6.5 12/25/2018   HGB 14.5 12/25/2018   HCT 45.1 12/25/2018   MCV 84 12/25/2018   PLT 221.0 06/28/2017   No results found for: IRON, TIBC, FERRITIN  Obesity Behavioral Intervention Documentation for Insurance:   Approximately 15 minutes were spent on the discussion below.  ASK: We discussed the diagnosis of obesity with Daniel Powers today and [name] agreed to give [name] permission to discuss obesity behavioral modification therapy today.  ASSESS: Daniel Powers has the diagnosis of obesity and his BMI today is 41.6. [name] is in the action stage of change.   ADVISE: Daniel Powers was educated on the multiple health risks of obesity as well as the benefit of weight loss to improve his health. He was advised of the need for long term treatment and the importance of lifestyle modifications to improve his current health and to decrease his risk of future health problems.  AGREE: Multiple dietary modification options and treatment options were discussed and Daniel Powers agreed to follow the recommendations documented in the above note.  ARRANGE: Daniel Powers was educated on the importance of frequent visits to treat obesity as outlined per CMS and USPSTF guidelines and agreed to schedule his next follow up appointment today.  Attestation Statements:   Reviewed by clinician on day of visit: allergies, medications, problem list, medical history, surgical history, family history, social history, and previous encounter notes.  IMichaelene Song, am acting as transcriptionist for Abby Potash, PA-C   I have reviewed the above documentation for accuracy and completeness, and I agree with the above. Abby Potash, PA-C

## 2020-03-18 ENCOUNTER — Ambulatory Visit (INDEPENDENT_AMBULATORY_CARE_PROVIDER_SITE_OTHER): Payer: Medicare HMO | Admitting: Physician Assistant

## 2020-03-30 ENCOUNTER — Other Ambulatory Visit: Payer: Self-pay | Admitting: Family Medicine

## 2020-03-30 ENCOUNTER — Ambulatory Visit (INDEPENDENT_AMBULATORY_CARE_PROVIDER_SITE_OTHER): Payer: Medicare HMO | Admitting: Physician Assistant

## 2020-03-30 DIAGNOSIS — I82401 Acute embolism and thrombosis of unspecified deep veins of right lower extremity: Secondary | ICD-10-CM

## 2020-03-30 DIAGNOSIS — E785 Hyperlipidemia, unspecified: Secondary | ICD-10-CM

## 2020-03-31 ENCOUNTER — Ambulatory Visit (INDEPENDENT_AMBULATORY_CARE_PROVIDER_SITE_OTHER): Payer: Medicare HMO | Admitting: Physician Assistant

## 2020-03-31 ENCOUNTER — Encounter (INDEPENDENT_AMBULATORY_CARE_PROVIDER_SITE_OTHER): Payer: Self-pay | Admitting: Physician Assistant

## 2020-03-31 ENCOUNTER — Other Ambulatory Visit: Payer: Self-pay

## 2020-03-31 VITALS — BP 134/67 | HR 82 | Temp 98.7°F | Ht 71.0 in | Wt 295.0 lb

## 2020-03-31 DIAGNOSIS — E1169 Type 2 diabetes mellitus with other specified complication: Secondary | ICD-10-CM | POA: Diagnosis not present

## 2020-03-31 DIAGNOSIS — E785 Hyperlipidemia, unspecified: Secondary | ICD-10-CM | POA: Diagnosis not present

## 2020-03-31 DIAGNOSIS — E559 Vitamin D deficiency, unspecified: Secondary | ICD-10-CM

## 2020-03-31 DIAGNOSIS — Z6841 Body Mass Index (BMI) 40.0 and over, adult: Secondary | ICD-10-CM | POA: Diagnosis not present

## 2020-04-01 ENCOUNTER — Other Ambulatory Visit (INDEPENDENT_AMBULATORY_CARE_PROVIDER_SITE_OTHER): Payer: Self-pay | Admitting: Physician Assistant

## 2020-04-01 DIAGNOSIS — E559 Vitamin D deficiency, unspecified: Secondary | ICD-10-CM

## 2020-04-01 MED ORDER — VITAMIN D (ERGOCALCIFEROL) 1.25 MG (50000 UNIT) PO CAPS: 50000.0000 [IU] | ORAL_CAPSULE | ORAL | 0 refills | Status: DC

## 2020-04-01 NOTE — Progress Notes (Signed)
Chief Complaint:   OBESITY Daniel Powers is here to discuss his progress with his obesity treatment plan along with follow-up of his obesity related diagnoses. Daniel Powers is on the Category 4 Plan and states he is following his eating plan approximately 75% of the time. Daniel Powers states he is doing 0 minutes 0 times per week.  Today's visit was #: 23 Starting weight: 309 lbs Starting date: 12/25/2018 Today's weight: 295 lbs Today's date: 03/31/2020 Total lbs lost to date: 14 Total lbs lost since last in-office visit: 3  Interim History: Amy reports that he is paying more attention to what he is eating and when he is eating it. He is eating a protein bar and protein shake for lunch when he doesn't have time to eat.  Subjective:   1. Hyperlipidemia associated with type 2 diabetes mellitus ([place]) Bram is on Lipitor and fish oil, and he denies myalgias.  2. Vitamin D deficiency [name] is on Vit D, and he is tolerating it well.  Assessment/Plan:   1. Hyperlipidemia associated with type 2 diabetes mellitus ([place]) Cardiovascular risk and specific lipid/LDL goals reviewed. We discussed several lifestyle modifications today. Mike will continue his medications, and will continue to work on diet, exercise and weight loss efforts. Orders and follow up as documented in patient record.   Counseling Intensive lifestyle modifications are the first line treatment for this issue. . Dietary changes: Increase soluble fiber. Decrease simple carbohydrates. . Exercise changes: Moderate to vigorous-intensity aerobic activity 150 minutes per week if tolerated. . Lipid-lowering medications: see documented in medical record.  2. Vitamin D deficiency Low Vitamin D level contributes to fatigue and are associated with obesity, breast, and colon cancer. Holten agreed to continue taking prescription Vitamin D 50,000 IU twice weekly #10 and we will refill for 1 month. He will follow-up for routine testing of Vitamin  D, at least 2-3 times per year to avoid over-replacement.  3. Class 3 severe obesity with serious comorbidity and body mass index (BMI) of 40.0 to 44.9 in adult, unspecified obesity type [facility]) Arrington is currently in the action stage of change. As such, his goal is to continue with weight loss efforts. He has agreed to the Category 4 Plan.   Exercise goals: No exercise has been prescribed at this time.  Behavioral modification strategies: increasing lean protein intake and no skipping meals.  Vang has agreed to follow-up with our clinic in 4 weeks. He was informed of the importance of frequent follow-up visits to maximize his success with intensive lifestyle modifications for his multiple health conditions.   Objective:   Blood pressure 134/67, pulse 82, temperature 98.7 F (37.1 C), temperature source Oral, height 5\' 11"  (1.803 m), weight 295 lb (133.8 kg), SpO2 99 %. Body mass index is 41.14 kg/m.  General: Cooperative, alert, well developed, in no acute distress. HEENT: Conjunctivae and lids unremarkable. Cardiovascular: Regular rhythm.  Lungs: Normal work of breathing. Neurologic: No focal deficits.   Lab Results  Component Value Date   CREATININE 0.97 02/04/2020   BUN 18 02/04/2020   NA 141 02/04/2020   K 4.3 02/04/2020   CL 106 02/04/2020   CO2 24 02/04/2020   Lab Results  Component Value Date   ALT 13 02/04/2020   AST 11 02/04/2020   ALKPHOS 46 (L) 02/04/2020   BILITOT 0.7 02/04/2020   Lab Results  Component Value Date   HGBA1C 5.8 (H) 02/04/2020   HGBA1C 5.6 09/16/2019   HGBA1C 5.8 06/05/2019   HGBA1C  5.7 (H) 12/25/2018   HGBA1C 6.1 08/05/2018   Lab Results  Component Value Date   INSULIN 6.7 02/04/2020   INSULIN 9.5 12/25/2018   Lab Results  Component Value Date   TSH 0.920 12/25/2018   Lab Results  Component Value Date   CHOL 132 02/04/2020   HDL 44 02/04/2020   LDLCALC 74 02/04/2020   TRIG 65 02/04/2020   CHOLHDL 3.0 02/04/2020   Lab  Results  Component Value Date   WBC 6.5 12/25/2018   HGB 14.5 12/25/2018   HCT 45.1 12/25/2018   MCV 84 12/25/2018   PLT 221.0 06/28/2017   No results found for: IRON, TIBC, FERRITIN  Obesity Behavioral Intervention:   Approximately 15 minutes were spent on the discussion below.  ASK: We discussed the diagnosis of obesity with Daniel Powers today and Daniel Powers agreed to give [name] permission to discuss obesity behavioral modification therapy today.  ASSESS: Daniel Powers has the diagnosis of obesity and his BMI today is 41.16. Daniel Powers is in the action stage of change.   ADVISE: Daniel Powers was educated on the multiple health risks of obesity as well as the benefit of weight loss to improve his health. He was advised of the need for long term treatment and the importance of lifestyle modifications to improve his current health and to decrease his risk of future health problems.  AGREE: Multiple dietary modification options and treatment options were discussed and Daniel Powers agreed to follow the recommendations documented in the above note.  ARRANGE: Daniel Powers was educated on the importance of frequent visits to treat obesity as outlined per CMS and USPSTF guidelines and agreed to schedule his next follow up appointment today.  Attestation Statements:   Reviewed by clinician on day of visit: allergies, medications, problem list, medical history, surgical history, family history, social history, and previous encounter notes.   [name], am acting as transcriptionist for [employer], PA-C.  I have reviewed the above documentation for accuracy and completeness, and I agree with the above. Abby Potash, PA-C

## 2020-04-20 ENCOUNTER — Encounter: Payer: Self-pay | Admitting: Family Medicine

## 2020-04-27 ENCOUNTER — Ambulatory Visit (INDEPENDENT_AMBULATORY_CARE_PROVIDER_SITE_OTHER): Payer: Medicare HMO | Admitting: Physician Assistant

## 2020-04-27 ENCOUNTER — Encounter (INDEPENDENT_AMBULATORY_CARE_PROVIDER_SITE_OTHER): Payer: Self-pay | Admitting: Physician Assistant

## 2020-04-27 ENCOUNTER — Other Ambulatory Visit: Payer: Self-pay

## 2020-04-27 VITALS — BP 135/81 | HR 78 | Temp 98.1°F | Ht 71.0 in | Wt 297.0 lb

## 2020-04-27 DIAGNOSIS — E1169 Type 2 diabetes mellitus with other specified complication: Secondary | ICD-10-CM

## 2020-04-27 DIAGNOSIS — Z6839 Body mass index (BMI) 39.0-39.9, adult: Secondary | ICD-10-CM

## 2020-04-27 DIAGNOSIS — E118 Type 2 diabetes mellitus with unspecified complications: Secondary | ICD-10-CM | POA: Diagnosis not present

## 2020-04-27 DIAGNOSIS — E785 Hyperlipidemia, unspecified: Secondary | ICD-10-CM

## 2020-04-28 DIAGNOSIS — N401 Enlarged prostate with lower urinary tract symptoms: Secondary | ICD-10-CM | POA: Diagnosis not present

## 2020-04-28 NOTE — Progress Notes (Signed)
Chief Complaint:   OBESITY Daniel Powers is here to discuss his progress with his obesity treatment plan along with follow-up of his obesity related diagnoses. Daniel Powers is on the Category 4 Plan and states he is following his eating plan approximately 75% of the time. Daniel Powers states he is exercising 0 minutes 0 times per week.  Today's visit was #: 24 Starting weight: 309 lbs Starting date: 12/25/2018 Today's weight: 297 lbs Today's date: 04/27/2020 Total lbs lost to date: 12 Total lbs lost since last in-office visit: 0  Interim History: Daniel Powers states that he has not been eating enough. There were some days he didn't feel like eating at all or only ate one meal. He denies mood changes or depression. He states that he will follow up with his PCP if his appetite continues to be decreased.   Subjective:   Hyperlipidemia associated with type 2 diabetes mellitus (Daniel Powers). Daniel Powers is on no medication for diabetes mellitus type II and he is not checking blood sugars at home. A1c is well controlled.   Lab Results  Component Value Date   HGBA1C 5.8 (H) 02/04/2020   HGBA1C 5.6 09/16/2019   HGBA1C 5.8 06/05/2019   Lab Results  Component Value Date   MICROALBUR 1.1 06/05/2019   LDLCALC 74 02/04/2020   CREATININE 0.97 02/04/2020   Lab Results  Component Value Date   INSULIN 6.7 02/04/2020   INSULIN 9.5 12/25/2018   Hyperlipidemia, unspecified hyperlipidemia type. Daniel Powers is managed by his PCP. He is on Lipitor and denies myalgias.   Lab Results  Component Value Date   CHOL 132 02/04/2020   HDL 44 02/04/2020   LDLCALC 74 02/04/2020   TRIG 65 02/04/2020   CHOLHDL 3.0 02/04/2020   Lab Results  Component Value Date   ALT 13 02/04/2020   AST 11 02/04/2020   ALKPHOS 46 (L) 02/04/2020   BILITOT 0.7 02/04/2020   The 10-year ASCVD risk score Daniel Powers., et al., 2013) is: 28.8%   Values used to calculate the score:     Age: 64 years     Sex: Male     Is Non-Hispanic African  American: Yes     Diabetic: Yes     Tobacco smoker: No     Systolic Blood Pressure: 654 mmHg     Is BP treated: Yes     HDL Cholesterol: 44 mg/dL     Total Cholesterol: 132 mg/dL  Assessment/Plan:   Hyperlipidemia associated with type 2 diabetes mellitus (Daniel Powers). Good blood sugar control is important to decrease the likelihood of diabetic complications such as nephropathy, neuropathy, limb loss, blindness, coronary artery disease, and death. Intensive lifestyle modification including diet, exercise and weight loss are the first line of treatment for diabetes.   Hyperlipidemia, unspecified hyperlipidemia type. Cardiovascular risk and specific lipid/LDL goals reviewed.  We discussed several lifestyle modifications today and Daniel Powers will continue to work on diet, exercise and weight loss efforts. Orders and follow up as documented in patient record. He will follow-up with his PCP as scheduled.  Counseling Intensive lifestyle modifications are the first line treatment for this issue.  Dietary changes: Increase soluble fiber. Decrease simple carbohydrates.  Exercise changes: Moderate to vigorous-intensity aerobic activity 150 minutes per week if tolerated.  Lipid-lowering medications: see documented in medical record.  Class 2 severe obesity with serious comorbidity and body mass index (BMI) of 39.0 to 39.9 in adult, unspecified obesity type (Daniel Powers).  Daniel Powers is currently in the action stage of  change. As such, his goal is to continue with weight loss efforts. He has agreed to the Category 4 Plan.   He will check in with his PCP regarding his lack of appetite.  He will have fasting labs next visit.  Exercise goals: Older adults should follow the adult guidelines. When older adults cannot meet the adult guidelines, they should be as physically active as their abilities and conditions will allow.   Behavioral modification strategies: increasing lean protein intake and no skipping meals.  Daniel Powers  has agreed to follow-up with our clinic in 3 weeks. He was informed of the importance of frequent follow-up visits to maximize his success with intensive lifestyle modifications for his multiple health conditions.   Objective:   Blood pressure 135/81, pulse 78, temperature 98.1 F (36.7 C), height 5\' 11"  (1.803 m), weight 297 lb (134.7 kg), SpO2 99 %. Body mass index is 41.42 kg/m.  General: Cooperative, alert, well developed, in no acute distress. HEENT: Conjunctivae and lids unremarkable. Cardiovascular: Regular rhythm.  Lungs: Normal work of breathing. Neurologic: No focal deficits.   Lab Results  Component Value Date   CREATININE 0.97 02/04/2020   BUN 18 02/04/2020   NA 141 02/04/2020   K 4.3 02/04/2020   CL 106 02/04/2020   CO2 24 02/04/2020   Lab Results  Component Value Date   ALT 13 02/04/2020   AST 11 02/04/2020   ALKPHOS 46 (L) 02/04/2020   BILITOT 0.7 02/04/2020   Lab Results  Component Value Date   HGBA1C 5.8 (H) 02/04/2020   HGBA1C 5.6 09/16/2019   HGBA1C 5.8 06/05/2019   HGBA1C 5.7 (H) 12/25/2018   HGBA1C 6.1 08/05/2018   Lab Results  Component Value Date   INSULIN 6.7 02/04/2020   INSULIN 9.5 12/25/2018   Lab Results  Component Value Date   TSH 0.920 12/25/2018   Lab Results  Component Value Date   CHOL 132 02/04/2020   HDL 44 02/04/2020   LDLCALC 74 02/04/2020   TRIG 65 02/04/2020   CHOLHDL 3.0 02/04/2020   Lab Results  Component Value Date   WBC 6.5 12/25/2018   HGB 14.5 12/25/2018   HCT 45.1 12/25/2018   MCV 84 12/25/2018   PLT 221.0 06/28/2017   No results found for: IRON, TIBC, FERRITIN  Obesity Behavioral Intervention:   Approximately 15 minutes were spent on the discussion below.  ASK: We discussed the diagnosis of obesity with Daniel Powers today and Daniel Powers agreed to give [name] permission to discuss obesity behavioral modification therapy today.  ASSESS: Daniel Powers has the diagnosis of obesity and his BMI today is 41.5. Daniel Powers is in  the action stage of change.   ADVISE: Daniel Powers was educated on the multiple health risks of obesity as well as the benefit of weight loss to improve his health. He was advised of the need for long term treatment and the importance of lifestyle modifications to improve his current health and to decrease his risk of future health problems.  AGREE: Multiple dietary modification options and treatment options were discussed and Daniel Powers agreed to follow the recommendations documented in the above note.  ARRANGE: Daniel Powers was educated on the importance of frequent visits to treat obesity as outlined per CMS and USPSTF guidelines and agreed to schedule his next follow up appointment today.  Attestation Statements:   Reviewed by clinician on day of visit: allergies, medications, problem list, medical history, surgical history, family history, social history, and previous encounter notes.  Migdalia Dk, am acting as Location manager  for Abby Potash, PA-C   I have reviewed the above documentation for accuracy and completeness, and I agree with the above. Abby Potash, PA-C

## 2020-05-04 ENCOUNTER — Other Ambulatory Visit (INDEPENDENT_AMBULATORY_CARE_PROVIDER_SITE_OTHER): Payer: Self-pay | Admitting: Physician Assistant

## 2020-05-04 ENCOUNTER — Telehealth: Payer: Self-pay | Admitting: Family Medicine

## 2020-05-04 DIAGNOSIS — N401 Enlarged prostate with lower urinary tract symptoms: Secondary | ICD-10-CM | POA: Diagnosis not present

## 2020-05-04 DIAGNOSIS — N5201 Erectile dysfunction due to arterial insufficiency: Secondary | ICD-10-CM | POA: Diagnosis not present

## 2020-05-04 DIAGNOSIS — N3941 Urge incontinence: Secondary | ICD-10-CM | POA: Diagnosis not present

## 2020-05-04 DIAGNOSIS — E559 Vitamin D deficiency, unspecified: Secondary | ICD-10-CM

## 2020-05-04 NOTE — Telephone Encounter (Signed)
Please advise 

## 2020-05-04 NOTE — Telephone Encounter (Signed)
I gave that to you ---  they sent a form in

## 2020-05-04 NOTE — Telephone Encounter (Signed)
Daniel Powers & Daniel Powers needs recommendations for the Eliquis before they could schedule treatment for patient. Please fax recommendations to [phone]

## 2020-05-04 NOTE — Telephone Encounter (Signed)
Spoke with Romie Minus and Hardie Shackleton. Verbal given for Eliquis.

## 2020-05-18 ENCOUNTER — Ambulatory Visit (INDEPENDENT_AMBULATORY_CARE_PROVIDER_SITE_OTHER): Payer: Medicare HMO | Admitting: Physician Assistant

## 2020-05-31 ENCOUNTER — Ambulatory Visit (INDEPENDENT_AMBULATORY_CARE_PROVIDER_SITE_OTHER): Payer: Medicare HMO | Admitting: Physician Assistant

## 2020-05-31 ENCOUNTER — Other Ambulatory Visit (INDEPENDENT_AMBULATORY_CARE_PROVIDER_SITE_OTHER): Payer: Self-pay | Admitting: Physician Assistant

## 2020-05-31 ENCOUNTER — Other Ambulatory Visit: Payer: Self-pay

## 2020-05-31 VITALS — BP 149/89 | HR 76 | Temp 98.1°F | Ht 71.0 in | Wt 296.0 lb

## 2020-05-31 DIAGNOSIS — E559 Vitamin D deficiency, unspecified: Secondary | ICD-10-CM | POA: Diagnosis not present

## 2020-05-31 DIAGNOSIS — Z6839 Body mass index (BMI) 39.0-39.9, adult: Secondary | ICD-10-CM

## 2020-05-31 DIAGNOSIS — E1169 Type 2 diabetes mellitus with other specified complication: Secondary | ICD-10-CM

## 2020-05-31 DIAGNOSIS — E785 Hyperlipidemia, unspecified: Secondary | ICD-10-CM

## 2020-05-31 MED ORDER — VITAMIN D (ERGOCALCIFEROL) 1.25 MG (50000 UNIT) PO CAPS: ORAL_CAPSULE | ORAL | 0 refills | Status: DC

## 2020-05-31 NOTE — Progress Notes (Signed)
Chief Complaint:   Daniel Powers is here to discuss his progress with his Daniel treatment plan along with follow-up of his Daniel related diagnoses. Daniel Powers is on the Category 4 Plan and states he is following his eating plan approximately 75% of the time. Daniel Powers states he is exercising 0 minutes 0 times per week.  Today's visit was #: 25 Starting weight: 309 lbs Starting date: 12/25/2018 Today's weight: 296 lbs Today's date: 05/31/2020 Total lbs lost to date: 13 Total lbs lost since last in-office visit: 1  Interim History: Daniel Powers states that his appetite is back to normal. He is tired of chicken and pork all the time. He is eating a sausage and egg McMuffin for breakfast; [employer] [place] sandwich or salad for lunch; and not always eating on plan for dinner. He will eat a protein bar or drink a protein shake if he does not eat a meal.  Subjective:   Type 2 diabetes mellitus with hyperlipidemia ([place]). Daniel Powers is on no medication. Last A1c 5.8 on 02/04/2020. No polyphagia or hypoglycemia.   Lab Results  Component Value Date   HGBA1C 5.8 (H) 02/04/2020   HGBA1C 5.6 09/16/2019   HGBA1C 5.8 06/05/2019   Lab Results  Component Value Date   MICROALBUR 1.1 06/05/2019   LDLCALC 74 02/04/2020   CREATININE 0.97 02/04/2020   Lab Results  Component Value Date   INSULIN 6.7 02/04/2020   INSULIN 9.5 12/25/2018   Hyperlipidemia associated with type 2 diabetes mellitus ([place]). Daniel Powers is on Lipitor. No chest pain or myalgias. He is not exercising. He is due for labs.   Lab Results  Component Value Date   CHOL 132 02/04/2020   HDL 44 02/04/2020   LDLCALC 74 02/04/2020   TRIG 65 02/04/2020   CHOLHDL 3.0 02/04/2020   Lab Results  Component Value Date   ALT 13 02/04/2020   AST 11 02/04/2020   ALKPHOS 46 (L) 02/04/2020   BILITOT 0.7 02/04/2020   The 10-year ASCVD risk score Daniel Powers DC Jr., et al., 2013) is: 33.7%   Values used to calculate the score:     Age: 64 years      Sex: Male     Is Non-Hispanic African American: Yes     Diabetic: Yes     Tobacco smoker: No     Systolic Blood Pressure: 542 mmHg     Is BP treated: Yes     HDL Cholesterol: 44 mg/dL     Total Cholesterol: 132 mg/dL  Vitamin D deficiency. Daniel Powers is on prescription Vitamin D twice weekly. No nausea, vomiting, or muscle weakness.    Ref. Range 02/04/2020 13:21  Vitamin D, 25-Hydroxy Latest Ref Range: 30.0 - 100.0 ng/mL 40.1   Assessment/Plan:   Type 2 diabetes mellitus with hyperlipidemia ([place]). Good blood sugar control is important to decrease the likelihood of diabetic complications such as nephropathy, neuropathy, limb loss, blindness, coronary artery disease, and death. Intensive lifestyle modification including diet, exercise and weight loss are the first line of treatment for diabetes. Daniel Powers will continue with diet and weight loss. A1c will be checked today.  Hyperlipidemia associated with type 2 diabetes mellitus ([place]). Cardiovascular risk and specific lipid/LDL goals reviewed.  We discussed several lifestyle modifications today and Daniel Powers will continue to work on diet, exercise and weight loss efforts. Orders and follow up as documented in patient record. He will continue his medication as directed. Labs will be checked today.   Counseling Intensive lifestyle modifications are  the first line treatment for this issue. . Dietary changes: Increase soluble fiber. Decrease simple carbohydrates. . Exercise changes: Moderate to vigorous-intensity aerobic activity 150 minutes per week if tolerated. . Lipid-lowering medications: see documented in medical record.  Vitamin D deficiency. Low Vitamin D level contributes to fatigue and are associated with Daniel, breast, and colon cancer. He was given a refill on his Vitamin D, Daniel Powers, (DRISDOL) 1.25 MG (50000 UNIT) CAPS capsule twice weekly #10 and VITAMIN D 25 Hydroxy (Vit-D Deficiency, Fractures) level will be checked today.   Class  2 severe Daniel with serious comorbidity and body mass index (BMI) of 39.0 to 39.9 in adult, unspecified Daniel type ([place]).  Daniel Powers is currently in the action stage of change. As such, his goal is to continue with weight loss efforts. He has agreed to the Category 4 Plan.   Exercise goals: Older adults should follow the adult guidelines. When older adults cannot meet the adult guidelines, they should be as physically active as their abilities and conditions will allow.   Behavioral modification strategies: increasing lean protein intake and no skipping meals.  Daniel Powers has agreed to follow-up with our clinic in 3 weeks. He was informed of the importance of frequent follow-up visits to maximize his success with intensive lifestyle modifications for his multiple health conditions.   Daniel Powers was informed we would discuss his lab results at his next visit unless there is a critical issue that needs to be addressed sooner. Daniel Powers agreed to keep his next visit at the agreed upon time to discuss these results.  Objective:   Blood pressure (!) 149/89, pulse 76, temperature 98.1 F (36.7 C), height 5\' 11"  (1.803 m), weight 296 lb (134.3 kg), SpO2 98 %. Body mass index is 41.28 kg/m.  General: Cooperative, alert, well developed, in no acute distress. HEENT: Conjunctivae and lids unremarkable. Cardiovascular: Regular rhythm.  Lungs: Normal work of breathing. Neurologic: No focal deficits.   Lab Results  Component Value Date   CREATININE 0.97 02/04/2020   BUN 18 02/04/2020   NA 141 02/04/2020   K 4.3 02/04/2020   CL 106 02/04/2020   CO2 24 02/04/2020   Lab Results  Component Value Date   ALT 13 02/04/2020   AST 11 02/04/2020   ALKPHOS 46 (L) 02/04/2020   BILITOT 0.7 02/04/2020   Lab Results  Component Value Date   HGBA1C 5.8 (H) 02/04/2020   HGBA1C 5.6 09/16/2019   HGBA1C 5.8 06/05/2019   HGBA1C 5.7 (H) 12/25/2018   HGBA1C 6.1 08/05/2018   Lab Results  Component Value Date    INSULIN 6.7 02/04/2020   INSULIN 9.5 12/25/2018   Lab Results  Component Value Date   TSH 0.920 12/25/2018   Lab Results  Component Value Date   CHOL 132 02/04/2020   HDL 44 02/04/2020   LDLCALC 74 02/04/2020   TRIG 65 02/04/2020   CHOLHDL 3.0 02/04/2020   Lab Results  Component Value Date   WBC 6.5 12/25/2018   HGB 14.5 12/25/2018   HCT 45.1 12/25/2018   MCV 84 12/25/2018   PLT 221.0 06/28/2017   No results found for: IRON, TIBC, FERRITIN  Daniel Behavioral Intervention:   Approximately 15 minutes were spent on the discussion below.  ASK: We discussed the diagnosis of Daniel with Daniel Powers today and Daniel Powers agreed to give [name] permission to discuss Daniel behavioral modification therapy today.  ASSESS: Daniel Powers has the diagnosis of Daniel and his BMI today is 41.3. Daniel Powers is in the action stage of  change.   ADVISE: Daniel Powers was educated on the multiple health risks of Daniel as well as the benefit of weight loss to improve his health. He was advised of the need for long term treatment and the importance of lifestyle modifications to improve his current health and to decrease his risk of future health problems.  AGREE: Multiple dietary modification options and treatment options were discussed and Arlon agreed to follow the recommendations documented in the above note.  ARRANGE: Marrio was educated on the importance of frequent visits to treat Daniel as outlined per CMS and USPSTF guidelines and agreed to schedule his next follow up appointment today.  Attestation Statements:   Reviewed by clinician on day of visit: allergies, medications, problem list, medical history, surgical history, family history, social history, and previous encounter notes.  IMichaelene Song, am acting as transcriptionist for Abby Potash, PA-C   I have reviewed the above documentation for accuracy and completeness, and I agree with the above. Abby Potash, PA-C

## 2020-05-31 NOTE — Telephone Encounter (Signed)
Last seen by Tracey 

## 2020-06-01 LAB — LIPID PANEL
Chol/HDL Ratio: 3 ratio (ref 0.0–5.0)
Cholesterol, Total: 134 mg/dL (ref 100–199)
HDL: 45 mg/dL (ref >39)
LDL Chol Calc (NIH): 77 mg/dL (ref 0–99)
Triglycerides: 53 mg/dL (ref 0–149)
VLDL Cholesterol Cal: 12 mg/dL (ref 5–40)

## 2020-06-01 LAB — COMPREHENSIVE METABOLIC PANEL
ALT: 15 IU/L (ref 0–44)
AST: 14 IU/L (ref 0–40)
Albumin/Globulin Ratio: 1.7 (ref 1.2–2.2)
Albumin: 4.2 g/dL (ref 3.8–4.8)
Alkaline Phosphatase: 40 IU/L — ABNORMAL LOW (ref 44–121)
BUN/Creatinine Ratio: 20 (ref 10–24)
BUN: 18 mg/dL (ref 8–27)
Bilirubin Total: 0.5 mg/dL (ref 0.0–1.2)
CO2: 23 mmol/L (ref 20–29)
Calcium: 9.3 mg/dL (ref 8.6–10.2)
Chloride: 107 mmol/L — ABNORMAL HIGH (ref 96–106)
Creatinine, Ser: 0.89 mg/dL (ref 0.76–1.27)
GFR calc Af Amer: 104 mL/min/{1.73_m2} (ref >59)
GFR calc non Af Amer: 90 mL/min/{1.73_m2} (ref >59)
Globulin, Total: 2.5 g/dL (ref 1.5–4.5)
Glucose: 91 mg/dL (ref 65–99)
Potassium: 4.4 mmol/L (ref 3.5–5.2)
Sodium: 142 mmol/L (ref 134–144)
Total Protein: 6.7 g/dL (ref 6.0–8.5)

## 2020-06-01 LAB — INSULIN, RANDOM: INSULIN: 11.1 u[IU]/mL (ref 2.6–24.9)

## 2020-06-01 LAB — HEMOGLOBIN A1C
Est. average glucose Bld gHb Est-mCnc: 123 mg/dL
Hgb A1c MFr Bld: 5.9 % — ABNORMAL HIGH (ref 4.8–5.6)

## 2020-06-01 LAB — VITAMIN D 25 HYDROXY (VIT D DEFICIENCY, FRACTURES): Vit D, 25-Hydroxy: 88.3 ng/mL (ref 30.0–100.0)

## 2020-06-07 ENCOUNTER — Other Ambulatory Visit: Payer: Self-pay

## 2020-06-07 ENCOUNTER — Encounter: Payer: Self-pay | Admitting: Family Medicine

## 2020-06-07 ENCOUNTER — Ambulatory Visit (INDEPENDENT_AMBULATORY_CARE_PROVIDER_SITE_OTHER): Payer: Medicare HMO | Admitting: Family Medicine

## 2020-06-07 VITALS — BP 130/88 | HR 79 | Temp 98.4°F | Resp 18 | Ht 71.0 in | Wt 300.8 lb

## 2020-06-07 DIAGNOSIS — Z23 Encounter for immunization: Secondary | ICD-10-CM

## 2020-06-07 DIAGNOSIS — E1165 Type 2 diabetes mellitus with hyperglycemia: Secondary | ICD-10-CM | POA: Diagnosis not present

## 2020-06-07 DIAGNOSIS — E1169 Type 2 diabetes mellitus with other specified complication: Secondary | ICD-10-CM | POA: Diagnosis not present

## 2020-06-07 DIAGNOSIS — I10 Essential (primary) hypertension: Secondary | ICD-10-CM

## 2020-06-07 DIAGNOSIS — E785 Hyperlipidemia, unspecified: Secondary | ICD-10-CM

## 2020-06-07 DIAGNOSIS — J449 Chronic obstructive pulmonary disease, unspecified: Secondary | ICD-10-CM | POA: Diagnosis not present

## 2020-06-07 NOTE — Assessment & Plan Note (Signed)
Tolerating statin, encouraged heart healthy diet, avoid trans fats, minimize simple carbs and saturated fats. Increase exercise as tolerated 

## 2020-06-07 NOTE — Patient Instructions (Signed)
Carbohydrate Counting for Diabetes Mellitus, Adult  Carbohydrate counting is a method of keeping track of how many carbohydrates you eat. Eating carbohydrates naturally increases the amount of sugar (glucose) in the blood. Counting how many carbohydrates you eat helps keep your blood glucose within normal limits, which helps you manage your diabetes (diabetes mellitus). It is important to know how many carbohydrates you can safely have in each meal. This is different for every person. A diet and nutrition specialist (registered dietitian) can help you make a meal plan and calculate how many carbohydrates you should have at each meal and snack. Carbohydrates are found in the following foods:  Grains, such as breads and cereals.  Dried beans and soy products.  Starchy vegetables, such as potatoes, peas, and corn.  Fruit and fruit juices.  Milk and yogurt.  Sweets and snack foods, such as cake, cookies, candy, chips, and soft drinks. How do I count carbohydrates? There are two ways to count carbohydrates in food. You can use either of the methods or a combination of both. Reading "Nutrition Facts" on packaged food The "Nutrition Facts" list is included on the labels of almost all packaged foods and beverages in the [place] It includes:  The serving size.  Information about nutrients in each serving, including the grams (g) of carbohydrate per serving. To use the "Nutrition Facts":  Decide how many servings you will have.  Multiply the number of servings by the number of carbohydrates per serving.  The resulting number is the total amount of carbohydrates that you will be having. Learning standard serving sizes of other foods When you eat carbohydrate foods that are not packaged or do not include "Nutrition Facts" on the label, you need to measure the servings in order to count the amount of carbohydrates:  Measure the foods that you will eat with a food scale or measuring cup, if  needed.  Decide how many standard-size servings you will eat.  Multiply the number of servings by 15. Most carbohydrate-rich foods have about 15 g of carbohydrates per serving. ? For example, if you eat 8 oz (170 g) of strawberries, you will have eaten 2 servings and 30 g of carbohydrates (2 servings x 15 g = 30 g).  For foods that have more than one food mixed, such as soups and casseroles, you must count the carbohydrates in each food that is included. The following list contains standard serving sizes of common carbohydrate-rich foods. Each of these servings has about 15 g of carbohydrates:   hamburger bun or  English muffin.   oz (15 mL) syrup.   oz (14 g) jelly.  1 slice of bread.  1 six-inch tortilla.  3 oz (85 g) cooked rice or pasta.  4 oz (113 g) cooked dried beans.  4 oz (113 g) starchy vegetable, such as peas, corn, or potatoes.  4 oz (113 g) hot cereal.  4 oz (113 g) mashed potatoes or  of a large baked potato.  4 oz (113 g) canned or frozen fruit.  4 oz (120 mL) fruit juice.  4-6 crackers.  6 chicken nuggets.  6 oz (170 g) unsweetened dry cereal.  6 oz (170 g) plain fat-free yogurt or yogurt sweetened with artificial sweeteners.  8 oz (240 mL) milk.  8 oz (170 g) fresh fruit or one small piece of fruit.  24 oz (680 g) popped popcorn. Example of carbohydrate counting Sample meal  3 oz (85 g) chicken breast.  6 oz (170 g)   brown rice.  4 oz (113 g) corn.  8 oz (240 mL) milk.  8 oz (170 g) strawberries with sugar-free whipped topping. Carbohydrate calculation 1. Identify the foods that contain carbohydrates: ? Rice. ? Corn. ? Milk. ? Strawberries. 2. Calculate how many servings you have of each food: ? 2 servings rice. ? 1 serving corn. ? 1 serving milk. ? 1 serving strawberries. 3. Multiply each number of servings by 15 g: ? 2 servings rice x 15 g = 30 g. ? 1 serving corn x 15 g = 15 g. ? 1 serving milk x 15 g = 15 g. ? 1  serving strawberries x 15 g = 15 g. 4. Add together all of the amounts to find the total grams of carbohydrates eaten: ? 30 g + 15 g + 15 g + 15 g = 75 g of carbohydrates total. Summary  Carbohydrate counting is a method of keeping track of how many carbohydrates you eat.  Eating carbohydrates naturally increases the amount of sugar (glucose) in the blood.  Counting how many carbohydrates you eat helps keep your blood glucose within normal limits, which helps you manage your diabetes.  A diet and nutrition specialist (registered dietitian) can help you make a meal plan and calculate how many carbohydrates you should have at each meal and snack. This information is not intended to replace advice given to you by your health care provider. Make sure you discuss any questions you have with your health care provider. Document Revised: 01/25/2017 Document Reviewed: 12/15/2015 Elsevier Patient Education  2020 [employer]  

## 2020-06-07 NOTE — Assessment & Plan Note (Signed)
Well controlled, no changes to meds. Encouraged heart healthy diet such as the DASH diet and exercise as tolerated.  °

## 2020-06-07 NOTE — Progress Notes (Signed)
Patient ID: Daniel Powers, male    DOB: 1955-11-22  Age: 64 y.o. MRN: [id number]    Subjective:  Subjective  HPI NAS WAFER presents for f/u dm , chol and htn.  Pt states about 6 months ago he fell coming out of his office --- he did not get hurt and did not hit his head.   He may have slipped or tripped but does not know for sure.  He said his legs did not give out on him.  He was not dizzy/ light headed   No cp, no palpitations or sob  Review of Systems  Constitutional: Negative for appetite change, diaphoresis, fatigue and unexpected weight change.  Eyes: Negative for pain, redness and visual disturbance.  Respiratory: Negative for cough, chest tightness, shortness of breath and wheezing.   Cardiovascular: Negative for chest pain, palpitations and leg swelling.  Endocrine: Negative for cold intolerance, heat intolerance, polydipsia, polyphagia and polyuria.  Genitourinary: Negative for difficulty urinating, dysuria and frequency.  Neurological: Negative for dizziness, light-headedness, numbness and headaches.    History Past Medical History:  Diagnosis Date  . Anxiety   . Arthritis   . Asthma   . COPD (chronic obstructive pulmonary disease) ([place])   . DVT (deep venous thrombosis) ([place])   . Gout   . High cholesterol   . Hypertension   . Influenza with pneumonia   . Lactose intolerance   . Obesity   . Pre-diabetes   . Psychosexual dysfunction with inhibited sexual excitement     He has a past surgical history that includes Tonsillectomy and adenoidectomy and Hand surgery.   His family history includes Alcohol abuse in his father; Alzheimer's disease in his maternal grandmother, mother, and paternal aunt; Cancer in his sister; Esophageal cancer in his maternal uncle; Heart disease (age of onset: 34) in his brother; Hypertension in his father, maternal grandfather, maternal grandmother, mother, paternal grandfather, and paternal grandmother; Lung cancer in his maternal uncle;  Obesity in his father.He reports that he has never smoked. He has never used smokeless tobacco. He reports current alcohol use. He reports that he does not use drugs.  Current Outpatient Medications on File Prior to Visit  Medication Sig Dispense Refill  . albuterol (PROAIR HFA) 108 (90 Base) MCG/ACT inhaler Inhale 2 puffs into the lungs every 6 (six) hours as needed for wheezing. 1 Inhaler 2  . amLODipine (NORVASC) 5 MG tablet TAKE 1 TABLET(5 MG) BY MOUTH DAILY 90 tablet 1  . atorvastatin (LIPITOR) 10 MG tablet TAKE 1 TABLET(10 MG) BY MOUTH DAILY AT 6 PM 90 tablet 1  . cetirizine (ZYRTEC) 10 MG tablet Take 10 mg by mouth daily.    [name] CINNAMON PO Take 1 tablet by mouth daily.    [name] ELIQUIS 5 MG TABS tablet TAKE 1 TABLET(5 MG) BY MOUTH TWICE DAILY 60 tablet 5  . fish oil-omega-3 fatty acids 1000 MG capsule Take 1 g by mouth daily.    . Flaxseed, Linseed, 1000 MG CAPS Take 1 capsule by mouth daily.    . fluticasone (FLONASE) 50 MCG/ACT nasal spray Place 2 sprays into both nostrils daily. 16 g 6  . olmesartan (BENICAR) 40 MG tablet TAKE 1 TABLET(40 MG) BY MOUTH DAILY 90 tablet 1  . tadalafil (CIALIS) 20 MG tablet Take 20 mg by mouth daily as needed.    . Vitamin D, Ergocalciferol, (DRISDOL) 1.25 MG (50000 UNIT) CAPS capsule TAKE 1 CAPSULE BY MOUTH 2 TIMES A WEEK 10 capsule 0  Current Facility-Administered Medications on File Prior to Visit  Medication Dose Route Frequency Provider Last Rate Last Admin  . 0.9 %  sodium chloride infusion  500 mL Intravenous Continuous Armbruster, [name], MD         Objective:  Objective  Physical Exam Vitals and nursing note reviewed.  Constitutional:      General: He is sleeping.     Appearance: He is well-developed.  HENT:     Head: Normocephalic and atraumatic.  Eyes:     Pupils: Pupils are equal, round, and reactive to light.  Neck:     Thyroid: No thyromegaly.  Cardiovascular:     Rate and Rhythm: Normal rate and regular rhythm.     Heart  sounds: No murmur heard.   Pulmonary:     Effort: Pulmonary effort is normal. No respiratory distress.     Breath sounds: Normal breath sounds. No wheezing or rales.  Chest:     Chest wall: No tenderness.  Musculoskeletal:        General: No tenderness.     Cervical back: Normal range of motion and neck supple.  Skin:    General: Skin is warm and dry.  Neurological:     Mental Status: He is oriented to person, place, and time.  Psychiatric:        Behavior: Behavior normal.        Thought Content: Thought content normal.        Judgment: Judgment normal.    BP 130/88 (BP Location: Left Arm, Patient Position: Sitting, Cuff Size: Large)   Pulse 79   Temp 98.4 F (36.9 C) (Oral)   Resp 18   Ht 5\' 11"  (1.803 m)   Wt (!) 300 lb 12.8 oz (136.4 kg)   SpO2 98%   BMI 41.95 kg/m  Wt Readings from Last 3 Encounters:  06/07/20 (!) 300 lb 12.8 oz (136.4 kg)  05/31/20 296 lb (134.3 kg)  04/27/20 297 lb (134.7 kg)     Lab Results  Component Value Date   WBC 6.5 12/25/2018   HGB 14.5 12/25/2018   HCT 45.1 12/25/2018   PLT 221.0 06/28/2017   GLUCOSE 91 05/31/2020   CHOL 134 05/31/2020   TRIG 53 05/31/2020   HDL 45 05/31/2020   LDLCALC 77 05/31/2020   ALT 15 05/31/2020   AST 14 05/31/2020   NA 142 05/31/2020   K 4.4 05/31/2020   CL 107 (H) 05/31/2020   CREATININE 0.89 05/31/2020   BUN 18 05/31/2020   CO2 23 05/31/2020   TSH 0.920 12/25/2018   PSA 1.31 06/28/2017   INR 1.8 (A) 09/16/2019   HGBA1C 5.9 (H) 05/31/2020   MICROALBUR 1.1 06/05/2019    DG Chest 2 View  Result Date: 02/02/2018 CLINICAL DATA:  Chest pain with deep inspiration for the past 2 months. EXAM: CHEST - 2 VIEW COMPARISON:  Chest x-ray dated February 05, 2012. FINDINGS: The heart size and mediastinal contours are within normal limits. Normal pulmonary vascularity. No focal consolidation, pleural effusion, or pneumothorax. No acute osseous abnormality. IMPRESSION: No active cardiopulmonary disease.  Electronically Signed   By: Titus Dubin M.D.   On: 02/02/2018 09:39   DG Thoracic Spine 2 View  Result Date: 02/02/2018 CLINICAL DATA:  Back pain with deep inspiration over the past 2 months. EXAM: THORACIC SPINE 2 VIEWS COMPARISON:  Chest x-ray dated February 05, 2012. FINDINGS: Twelve rib-bearing thoracic vertebral bodies. No acute fracture or subluxation. Vertebral body heights are preserved. Alignment is  normal. Flowing anterior endplate osteophytes, consistent with DISH. Intervertebral disc spaces are maintained. IMPRESSION: 1.  No acute osseous abnormality. 2. Findings consistent with DISH. Electronically Signed   By: Titus Dubin M.D.   On: 02/02/2018 09:41     Assessment & Plan:  Plan  I am having [name] maintain his fish oil-omega-3 fatty acids, tadalafil, Flaxseed (Linseed), cetirizine, fluticasone, CINNAMON PO, albuterol, amLODipine, olmesartan, Eliquis, atorvastatin, and Vitamin D (Ergocalciferol). We will continue to administer sodium chloride.  No orders of the defined types were placed in this encounter.   Problem List Items Addressed This Visit      Unprioritized   Essential hypertension    Well controlled, no changes to meds. Encouraged heart healthy diet such as the DASH diet and exercise as tolerated.       Hyperlipidemia    Tolerating statin, encouraged heart healthy diet, avoid trans fats, minimize simple carbs and saturated fats. Increase exercise as tolerated      Hyperlipidemia associated with type 2 diabetes mellitus ([employer])    Tolerating statin, encouraged heart healthy diet, avoid trans fats, minimize simple carbs and saturated fats. Increase exercise as tolerated      Need for influenza vaccination   Relevant Orders   Flu Vaccine QUAD High Dose(Fluad) (Completed)   Uncontrolled type 2 diabetes mellitus with hyperglycemia ([place])    Other Visit Diagnoses    Primary hypertension    -  Primary      Follow-up: Return in about 6 months (around  12/05/2020), or if symptoms worsen or fail to improve, for annual exam, fasting.  Ann Held, DO

## 2020-06-15 ENCOUNTER — Other Ambulatory Visit: Payer: Self-pay

## 2020-06-15 DIAGNOSIS — I10 Essential (primary) hypertension: Secondary | ICD-10-CM

## 2020-06-15 MED ORDER — AMLODIPINE BESYLATE 5 MG PO TABS: ORAL_TABLET | ORAL | 1 refills | Status: DC

## 2020-06-22 ENCOUNTER — Other Ambulatory Visit: Payer: Self-pay

## 2020-06-22 ENCOUNTER — Encounter (INDEPENDENT_AMBULATORY_CARE_PROVIDER_SITE_OTHER): Payer: Self-pay | Admitting: Physician Assistant

## 2020-06-22 ENCOUNTER — Ambulatory Visit (INDEPENDENT_AMBULATORY_CARE_PROVIDER_SITE_OTHER): Payer: Medicare HMO | Admitting: Physician Assistant

## 2020-06-22 VITALS — BP 119/75 | HR 74 | Temp 98.2°F | Ht 71.0 in | Wt 295.0 lb

## 2020-06-22 DIAGNOSIS — I10 Essential (primary) hypertension: Secondary | ICD-10-CM

## 2020-06-22 DIAGNOSIS — E559 Vitamin D deficiency, unspecified: Secondary | ICD-10-CM

## 2020-06-22 DIAGNOSIS — Z6841 Body Mass Index (BMI) 40.0 and over, adult: Secondary | ICD-10-CM | POA: Diagnosis not present

## 2020-06-22 NOTE — Progress Notes (Signed)
Chief Complaint:   OBESITY Daniel Powers is here to discuss his progress with his obesity treatment plan along with follow-up of his obesity related diagnoses. Daniel Powers is on the Category 4 Plan and states he is following his eating plan approximately 75% of the time. Daniel Powers states he is exercising 0 minutes 0 times per week.  Today's visit was #: 26 Starting weight: 309 lbs Starting date: 12/25/2018 Today's weight: 295 lbs Today's date: 06/22/2020 Total lbs lost to date: 14 Total lbs lost since last in-office visit: 1  Interim History: Daniel Powers feels like he did well overall during Thanksgiving. He ate in moderation and ate a lot of  [place] and vegetables. Most days he is eating lunch and no longer skipping that meal.  Subjective:   Essential hypertension. Daniel Powers recently followed up with his PCP for hypertension and no changes were made. Blood pressure is controlled.  BP Readings from Last 3 Encounters:  06/22/20 119/75  06/07/20 130/88  05/31/20 (!) 149/89   Lab Results  Component Value Date   CREATININE 0.89 05/31/2020   CREATININE 0.97 02/04/2020   CREATININE 1.01 09/16/2019   Vitamin D deficiency. Daniel Powers is on Vitamin D twice a week. Last level at goal.   Ref. Range 05/31/2020 09:12  Vitamin D, 25-Hydroxy Latest Ref Range: 30.0 - 100.0 ng/mL 88.3   Assessment/Plan:   Essential hypertension. Daniel Powers is working on healthy weight loss and exercise to improve blood pressure control. We will watch for signs of hypotension as he continues his lifestyle modifications. He will continue his medication as directed and follow-up with his PCP as scheduled.  Vitamin D deficiency. Low Vitamin D level contributes to fatigue and are associated with obesity, breast, and colon cancer. He will change to taking Vitamin D once weekly (no script) and discontinue the twice weekly dose. He will follow-up for routine testing of Vitamin D, at least 2-3 times per year to avoid  over-replacement.  Class 3 severe obesity with serious comorbidity and body mass index (BMI) of 40.0 to 44.9 in adult, unspecified obesity type ([place]).  Daniel Powers is currently in the action stage of change. As such, his goal is to continue with weight loss efforts. He has agreed to the Category 4 Plan.   Exercise goals: Older adults should follow the adult guidelines. When older adults cannot meet the adult guidelines, they should be as physically active as their abilities and conditions will allow.   Behavioral modification strategies: increasing lean protein intake and no skipping meals.  Daniel Powers has agreed to follow-up with our clinic in 4 weeks. He was informed of the importance of frequent follow-up visits to maximize his success with intensive lifestyle modifications for his multiple health conditions.   Objective:   Blood pressure 119/75, pulse 74, temperature 98.2 F (36.8 C), height 5\' 11"  (1.803 m), weight 295 lb (133.8 kg), SpO2 98 %. Body mass index is 41.14 kg/m.  General: Cooperative, alert, well developed, in no acute distress. HEENT: Conjunctivae and lids unremarkable. Cardiovascular: Regular rhythm.  Lungs: Normal work of breathing. Neurologic: No focal deficits.   Lab Results  Component Value Date   CREATININE 0.89 05/31/2020   BUN 18 05/31/2020   NA 142 05/31/2020   K 4.4 05/31/2020   CL 107 (H) 05/31/2020   CO2 23 05/31/2020   Lab Results  Component Value Date   ALT 15 05/31/2020   AST 14 05/31/2020   ALKPHOS 40 (L) 05/31/2020   BILITOT 0.5 05/31/2020   Lab  Results  Component Value Date   HGBA1C 5.9 (H) 05/31/2020   HGBA1C 5.8 (H) 02/04/2020   HGBA1C 5.6 09/16/2019   HGBA1C 5.8 06/05/2019   HGBA1C 5.7 (H) 12/25/2018   Lab Results  Component Value Date   INSULIN 11.1 05/31/2020   INSULIN 6.7 02/04/2020   INSULIN 9.5 12/25/2018   Lab Results  Component Value Date   TSH 0.920 12/25/2018   Lab Results  Component Value Date   CHOL 134 05/31/2020    HDL 45 05/31/2020   LDLCALC 77 05/31/2020   TRIG 53 05/31/2020   CHOLHDL 3.0 05/31/2020   Lab Results  Component Value Date   WBC 6.5 12/25/2018   HGB 14.5 12/25/2018   HCT 45.1 12/25/2018   MCV 84 12/25/2018   PLT 221.0 06/28/2017   No results found for: IRON, TIBC, FERRITIN  Obesity Behavioral Intervention:   Approximately 15 minutes were spent on the discussion below.  ASK: We discussed the diagnosis of obesity with Daniel Powers today and Daniel Powers agreed to give [name] permission to discuss obesity behavioral modification therapy today.  ASSESS: Daniel Powers has the diagnosis of obesity and his BMI today is 41.3. [name] is in the action stage of change.   ADVISE: Daniel Powers was educated on the multiple health risks of obesity as well as the benefit of weight loss to improve his health. He was advised of the need for long term treatment and the importance of lifestyle modifications to improve his current health and to decrease his risk of future health problems.  AGREE: Multiple dietary modification options and treatment options were discussed and Daniel Powers agreed to follow the recommendations documented in the above note.  ARRANGE: Daniel Powers was educated on the importance of frequent visits to treat obesity as outlined per CMS and USPSTF guidelines and agreed to schedule his next follow up appointment today.  Attestation Statements:   Reviewed by clinician on day of visit: allergies, medications, problem list, medical history, surgical history, family history, social history, and previous encounter notes.  IMichaelene Song, am acting as transcriptionist for Abby Potash, PA-C   I have reviewed the above documentation for accuracy and completeness, and I agree with the above. Abby Potash, PA-C

## 2020-07-20 ENCOUNTER — Encounter (INDEPENDENT_AMBULATORY_CARE_PROVIDER_SITE_OTHER): Payer: Self-pay | Admitting: Physician Assistant

## 2020-07-20 ENCOUNTER — Ambulatory Visit (INDEPENDENT_AMBULATORY_CARE_PROVIDER_SITE_OTHER): Payer: Medicare HMO | Admitting: Physician Assistant

## 2020-07-20 ENCOUNTER — Other Ambulatory Visit: Payer: Self-pay

## 2020-07-20 VITALS — BP 115/71 | HR 80 | Temp 98.6°F | Ht 71.0 in | Wt 293.0 lb

## 2020-07-20 DIAGNOSIS — I152 Hypertension secondary to endocrine disorders: Secondary | ICD-10-CM

## 2020-07-20 DIAGNOSIS — Z6841 Body Mass Index (BMI) 40.0 and over, adult: Secondary | ICD-10-CM

## 2020-07-20 DIAGNOSIS — E1159 Type 2 diabetes mellitus with other circulatory complications: Secondary | ICD-10-CM | POA: Diagnosis not present

## 2020-07-20 DIAGNOSIS — E785 Hyperlipidemia, unspecified: Secondary | ICD-10-CM | POA: Diagnosis not present

## 2020-07-20 DIAGNOSIS — E1169 Type 2 diabetes mellitus with other specified complication: Secondary | ICD-10-CM | POA: Diagnosis not present

## 2020-07-21 NOTE — Progress Notes (Signed)
Chief Complaint:   OBESITY Daniel Powers Powers is here to discuss his progress with his obesity treatment plan along with follow-up of his obesity related diagnoses. Daniel Powers Powers is on Daniel Powers Category 4 Plan and states he is following his eating plan approximately 75% of Daniel Powers time. Daniel Powers Powers states he is doing 0 minutes 0 times per week.  Today's visit was #: 27 Starting weight: 309 lbs Starting date: 12/25/2018 Today's weight: 293 lbs Today's date: 07/20/2020 Total lbs lost to date: 16 Total lbs lost since last in-office visit: 2  Interim History: Daniel Powers Powers did well with weight loss during Daniel Powers holiday, eating a lot of [place] and veggies. He did indulge in mac-n-cheese at times. In general, he is not eating enough during Daniel Powers day especially skipping lunch at work.  Subjective:   1. Hyperlipidemia associates with type 2 diabetes mellitus (Daniel Powers Powers) Daniel Powers Powers is on atorvastatin and fish oil, and he denies chest pain or myalgias. He is not exercising, and he is followed by his primary care physician.  2. Type 2 diabetes mellitus associated with hypertension (Daniel Powers Powers) Daniel Powers Powers is not on medications, and his last A1c was 5.9. He denies polyphagia or hypoglycemia. He is very mindful of his carbohydrate intake.  Assessment/Plan:   1. Hyperlipidemia associates with type 2 diabetes mellitus (Daniel Powers Powers) Cardiovascular risk and specific lipid/LDL goals reviewed.  We discussed several lifestyle modifications today. Daniel Powers Powers will continue his medications and follow up with his primary care physician. He will continue to work on diet, exercise and weight loss efforts. Orders and follow up as documented in patient record.   Counseling Intensive lifestyle modifications are Daniel Powers first line treatment for this issue. . Dietary changes: Increase soluble fiber. Decrease simple carbohydrates. . Exercise changes: Moderate to vigorous-intensity aerobic activity 150 minutes per week if tolerated. . Lipid-lowering medications: see documented in medical  record.  2. Type 2 diabetes mellitus associated with hypertension (Daniel Powers Powers) Good blood sugar control is important to decrease Daniel Powers likelihood of diabetic complications such as nephropathy, neuropathy, limb loss, blindness, coronary artery disease, and death. Intensive lifestyle modification including diet, exercise and weight loss are Daniel Powers first line of treatment for diabetes. Daniel Powers Powers will continue his meal plan and recheck his A1c every 3 months.  3. Class 3 severe obesity with serious comorbidity and body mass index (BMI) of 40.0 to 44.9 in adult, unspecified obesity type Daniel Powers Powers) Daniel Powers Powers is currently in Daniel Powers Powers. As such, his goal is to continue with weight loss efforts. He has agreed to Daniel Powers Category 4 Plan.   Exercise goals: No exercise has been prescribed at this time.  Behavioral modification strategies: increasing lean protein intake and no skipping meals.  Daniel Powers Powers has agreed to follow-up with our clinic in 3 to 4 weeks. He was informed of Daniel Powers importance of frequent follow-up visits to maximize his success with intensive lifestyle modifications for his multiple health conditions.   Objective:   Blood pressure 115/71, pulse 80, temperature 98.6 F (37 C), height _0  (1.803 m), weight 293 lb (132.9 kg), SpO2 98 %. Body mass index is 40.87 kg/m.  General: Cooperative, alert, well developed, in no acute distress. HEENT: Conjunctivae and lids unremarkable. Cardiovascular: Regular rhythm.  Lungs: Normal work of breathing. Neurologic: No focal deficits.   Lab Results  Component Value Date   CREATININE 0.89 05/31/2020   BUN 18 05/31/2020   NA 142 05/31/2020   K 4.4 05/31/2020   CL 107 (H) 05/31/2020   CO2 23 05/31/2020   Lab Results  Component  Value Date   ALT 15 05/31/2020   AST 14 05/31/2020   ALKPHOS 40 (L) 05/31/2020   BILITOT 0.5 05/31/2020   Lab Results  Component Value Date   HGBA1C 5.9 (H) 05/31/2020   HGBA1C 5.8 (H) 02/04/2020   HGBA1C 5.6 09/16/2019    HGBA1C 5.8 06/05/2019   HGBA1C 5.7 (H) 12/25/2018   Lab Results  Component Value Date   INSULIN 11.1 05/31/2020   INSULIN 6.7 02/04/2020   INSULIN 9.5 12/25/2018   Lab Results  Component Value Date   TSH 0.920 12/25/2018   Lab Results  Component Value Date   CHOL 134 05/31/2020   HDL 45 05/31/2020   LDLCALC 77 05/31/2020   TRIG 53 05/31/2020   CHOLHDL 3.0 05/31/2020   Lab Results  Component Value Date   WBC 6.5 12/25/2018   HGB 14.5 12/25/2018   HCT 45.1 12/25/2018   MCV 84 12/25/2018   PLT 221.0 06/28/2017   No results found for: IRON, TIBC, FERRITIN  Obesity Behavioral Intervention:   Approximately 15 minutes were spent on Daniel Powers discussion below.  ASK: We discussed Daniel Powers diagnosis of obesity with Daniel Powers Powers today and Daniel Powers Powers agreed to give [name] permission to discuss obesity behavioral modification therapy today.  ASSESS: Daniel Powers Powers has Daniel Powers diagnosis of obesity and his BMI today is 40.88. Daniel Powers Powers is in Daniel Powers Powers.   ADVISE: Daniel Powers was educated on Daniel Powers multiple health risks of obesity as well as Daniel Powers benefit of weight loss to improve his health. He was advised of Daniel Powers need for long term treatment and Daniel Powers importance of lifestyle modifications to improve his current health and to decrease his risk of future health problems.  AGREE: Multiple dietary modification options and treatment options were discussed and Daniel Powers agreed to follow Daniel Powers recommendations documented in Daniel Powers above note.  ARRANGE: Daniel Powers Powers was educated on Daniel Powers importance of frequent visits to treat obesity as outlined per CMS and USPSTF guidelines and agreed to schedule his next follow up appointment today.  Attestation Statements:   Reviewed by clinician on day of visit: allergies, medications, problem list, medical history, surgical history, family history, social history, and previous encounter notes.   Daniel Powers Powers, am acting as transcriptionist for [employer], PA-C.  I have reviewed Daniel Powers above  documentation for accuracy and completeness, and I agree with Daniel Powers above. Abby Potash, PA-C

## 2020-08-10 ENCOUNTER — Ambulatory Visit (INDEPENDENT_AMBULATORY_CARE_PROVIDER_SITE_OTHER): Payer: Medicare HMO | Admitting: Physician Assistant

## 2020-08-10 ENCOUNTER — Encounter (INDEPENDENT_AMBULATORY_CARE_PROVIDER_SITE_OTHER): Payer: Self-pay | Admitting: Physician Assistant

## 2020-08-10 ENCOUNTER — Other Ambulatory Visit: Payer: Self-pay

## 2020-08-10 VITALS — BP 127/73 | HR 84 | Temp 98.1°F | Ht 71.0 in | Wt 294.0 lb

## 2020-08-10 DIAGNOSIS — I152 Hypertension secondary to endocrine disorders: Secondary | ICD-10-CM

## 2020-08-10 DIAGNOSIS — E1159 Type 2 diabetes mellitus with other circulatory complications: Secondary | ICD-10-CM

## 2020-08-10 DIAGNOSIS — Z6841 Body Mass Index (BMI) 40.0 and over, adult: Secondary | ICD-10-CM

## 2020-08-10 DIAGNOSIS — E559 Vitamin D deficiency, unspecified: Secondary | ICD-10-CM

## 2020-08-11 MED ORDER — VITAMIN D (ERGOCALCIFEROL) 1.25 MG (50000 UNIT) PO CAPS: 50000.0000 [IU] | ORAL_CAPSULE | ORAL | 0 refills | Status: DC

## 2020-08-11 NOTE — Progress Notes (Unsigned)
Chief Complaint:   OBESITY Daniel Powers is here to discuss his progress with his obesity treatment plan along with follow-up of his obesity related diagnoses. Daniel Powers is on the Category 4 Plan and states he is following his eating plan approximately 75% of the time. Daniel Powers states he is doing 0 minutes 0 times per week.  Today's visit was #: 28 Starting weight: 309 lbs Starting date: 12/25/2018 Today's weight: 294 lbs Today's date: 08/10/2020 Total lbs lost to date: 15 Total lbs lost since last in-office visit: 0  Interim History: Daniel Powers reports that he ate more fried chicken than normal. Some days are so busy with work that he skips meals. He drinks protein shakes at times to substitute for lunch.  Subjective:   1. Vitamin D deficiency Daniel Powers is on Vit D weekly, and his last Vit D level was at goal.  2. Hypertension associated with type 2 diabetes mellitus (Daniel Powers) Daniel Powers's blood pressure is controlled. He denies headache or chest pain on amlodipine and Benicar.  Assessment/Plan:   1. Vitamin D deficiency Low Vitamin D level contributes to fatigue and are associated with obesity, breast, and colon cancer. We will refill prescription Vitamin D for 1 month. Daniel Powers will follow-up for routine testing of Vitamin D, at least 2-3 times per year to avoid over-replacement.  2. Hypertension associated with type 2 diabetes mellitus (Daniel Powers) Daniel Powers will continue his medications, and follow up with his primary care physician. He will continue working on healthy weight loss and exercise to improve blood pressure control. We will watch for signs of hypotension as he continues his lifestyle modifications.  3. Class 3 severe obesity with serious comorbidity and body mass index (BMI) of 40.0 to 44.9 in adult, unspecified obesity type Daniel Powers) Daniel Powers is currently in the action stage of change. As such, his goal is to continue with weight loss efforts. He has agreed to the Category 4 Plan.   Exercise goals: No  exercise has been prescribed at this time.  Behavioral modification strategies: increasing lean protein intake and no skipping meals.  Daniel Powers has agreed to follow-up with our clinic in 3 weeks. He was informed of the importance of frequent follow-up visits to maximize his success with intensive lifestyle modifications for his multiple health conditions.   Objective:   Blood pressure 127/73, pulse 84, temperature 98.1 F (36.7 C), height 5\' 11"  (1.803 m), weight 294 lb (133.4 kg), SpO2 99 %. Body mass index is 41 kg/m.  General: Cooperative, alert, well developed, in no acute distress. HEENT: Conjunctivae and lids unremarkable. Cardiovascular: Regular rhythm.  Lungs: Normal work of breathing. Neurologic: No focal deficits.   Lab Results  Component Value Date   CREATININE 0.89 05/31/2020   BUN 18 05/31/2020   NA 142 05/31/2020   K 4.4 05/31/2020   CL 107 (H) 05/31/2020   CO2 23 05/31/2020   Lab Results  Component Value Date   ALT 15 05/31/2020   AST 14 05/31/2020   ALKPHOS 40 (L) 05/31/2020   BILITOT 0.5 05/31/2020   Lab Results  Component Value Date   HGBA1C 5.9 (H) 05/31/2020   HGBA1C 5.8 (H) 02/04/2020   HGBA1C 5.6 09/16/2019   HGBA1C 5.8 06/05/2019   HGBA1C 5.7 (H) 12/25/2018   Lab Results  Component Value Date   INSULIN 11.1 05/31/2020   INSULIN 6.7 02/04/2020   INSULIN 9.5 12/25/2018   Lab Results  Component Value Date   TSH 0.920 12/25/2018   Lab Results  Component Value Date  CHOL 134 05/31/2020   HDL 45 05/31/2020   LDLCALC 77 05/31/2020   TRIG 53 05/31/2020   CHOLHDL 3.0 05/31/2020   Lab Results  Component Value Date   WBC 6.5 12/25/2018   HGB 14.5 12/25/2018   HCT 45.1 12/25/2018   MCV 84 12/25/2018   PLT 221.0 06/28/2017   No results found for: IRON, TIBC, FERRITIN  Obesity Behavioral Intervention:   Approximately 15 minutes were spent on the discussion below.  ASK: We discussed the diagnosis of obesity with Daniel Powers today and  Daniel Powers agreed to give [name] permission to discuss obesity behavioral modification therapy today.  ASSESS: Daniel Powers has the diagnosis of obesity and his BMI today is 41.02. Daniel Powers is in the action stage of change.   ADVISE: Daniel Powers was educated on the multiple health risks of obesity as well as the benefit of weight loss to improve his health. He was advised of the need for long term treatment and the importance of lifestyle modifications to improve his current health and to decrease his risk of future health problems.  AGREE: Multiple dietary modification options and treatment options were discussed and Daniel Powers agreed to follow the recommendations documented in the above note.  ARRANGE: Daniel Powers was educated on the importance of frequent visits to treat obesity as outlined per CMS and USPSTF guidelines and agreed to schedule his next follow up appointment today.  Attestation Statements:   Reviewed by clinician on day of visit: allergies, medications, problem list, medical history, surgical history, family history, social history, and previous encounter notes.   [name], am acting as transcriptionist for [employer], PA-C.  I have reviewed the above documentation for accuracy and completeness, and I agree with the above. Abby Potash, PA-C

## 2020-08-28 ENCOUNTER — Other Ambulatory Visit: Payer: Self-pay | Admitting: Family Medicine

## 2020-08-28 DIAGNOSIS — I10 Essential (primary) hypertension: Secondary | ICD-10-CM

## 2020-08-31 ENCOUNTER — Ambulatory Visit (INDEPENDENT_AMBULATORY_CARE_PROVIDER_SITE_OTHER): Payer: Medicare HMO | Admitting: Physician Assistant

## 2020-09-09 ENCOUNTER — Other Ambulatory Visit (INDEPENDENT_AMBULATORY_CARE_PROVIDER_SITE_OTHER): Payer: Self-pay | Admitting: Physician Assistant

## 2020-09-09 ENCOUNTER — Encounter (INDEPENDENT_AMBULATORY_CARE_PROVIDER_SITE_OTHER): Payer: Self-pay | Admitting: Physician Assistant

## 2020-09-09 ENCOUNTER — Ambulatory Visit (INDEPENDENT_AMBULATORY_CARE_PROVIDER_SITE_OTHER): Payer: Medicare HMO | Admitting: Physician Assistant

## 2020-09-09 ENCOUNTER — Other Ambulatory Visit: Payer: Self-pay

## 2020-09-09 VITALS — BP 143/73 | HR 70 | Temp 98.5°F | Ht 71.0 in | Wt 292.0 lb

## 2020-09-09 DIAGNOSIS — E559 Vitamin D deficiency, unspecified: Secondary | ICD-10-CM

## 2020-09-09 DIAGNOSIS — Z6841 Body Mass Index (BMI) 40.0 and over, adult: Secondary | ICD-10-CM

## 2020-09-09 DIAGNOSIS — E1169 Type 2 diabetes mellitus with other specified complication: Secondary | ICD-10-CM

## 2020-09-09 DIAGNOSIS — E785 Hyperlipidemia, unspecified: Secondary | ICD-10-CM

## 2020-09-09 MED ORDER — VITAMIN D (ERGOCALCIFEROL) 1.25 MG (50000 UNIT) PO CAPS: 50000.0000 [IU] | ORAL_CAPSULE | ORAL | 0 refills | Status: DC

## 2020-09-13 NOTE — Progress Notes (Signed)
Chief Complaint:   OBESITY Daniel Powers is here to discuss his progress with his obesity treatment plan along with follow-up of his obesity related diagnoses. Daniel Powers is on the Category 4 Plan and states he is following his eating plan approximately 60% of the time. Daniel Powers states he is doing 0 minutes 0 times per week.  Today's visit was #: 78 Starting weight: 309 lbs Starting date: 12/25/2018 Today's weight: 292 lbs Today's date: 09/09/2020 Total lbs lost to date: 17 Total lbs lost since last in-office visit: 2  Interim History: Daniel Powers states that he has been more "aware" of what he is eating. He is eating less carbohydrates in general. He indulges in vanilla ice cream periodically.  Subjective:   1. Hyperlipidemia associated with type 2 diabetes mellitus ([place]) Daniel Powers's last lipid panel was at goal. He is due to have labs done soon. He is on Lipitor, and he denies chest pain or myalgias.  2. Vitamin D deficiency Daniel Powers is on Vit D, and he denies nausea, vomiting, or muscle weakness. He is due for labs soon.  Assessment/Plan:   1. Hyperlipidemia associated with type 2 diabetes mellitus ([place]) Cardiovascular risk and specific lipid/LDL goals reviewed. We discussed several lifestyle modifications today. Daniel Powers will continue to work on diet, exercise and weight loss efforts. We will recheck labs at his next visit. Orders and follow up as documented in patient record.   Counseling Intensive lifestyle modifications are the first line treatment for this issue. . Dietary changes: Increase soluble fiber. Decrease simple carbohydrates. . Exercise changes: Moderate to vigorous-intensity aerobic activity 150 minutes per week if tolerated. . Lipid-lowering medications: see documented in medical record.  2. Vitamin D deficiency Low Vitamin D level contributes to fatigue and are associated with obesity, breast, and colon cancer. We will refill prescription Vitamin D for 1 month, and we will  recheck labs at his next visit. Daniel Powers will follow-up for routine testing of Vitamin D, at least 2-3 times per year to avoid over-replacement.  - Vitamin D, Ergocalciferol, (DRISDOL) 1.25 MG (50000 UNIT) CAPS capsule; Take 1 capsule (50,000 Units total) by mouth every 7 (seven) days.  Dispense: 4 capsule; Refill: 0  3. Class 3 severe obesity with serious comorbidity and body mass index (BMI) of 40.0 to 44.9 in adult, unspecified obesity type Daniel Powers) Daniel Powers is currently in the action stage of change. As such, his goal is to continue with weight loss efforts. He has agreed to the Category 4 Plan.   We will recheck fasting labs at his next visit.  Exercise goals: No exercise has been prescribed at this time.  Behavioral modification strategies: increasing lean protein intake, meal planning and cooking strategies and keeping healthy foods in the home.  Daniel Powers has agreed to follow-up with our clinic in 3 weeks. He was informed of the importance of frequent follow-up visits to maximize his success with intensive lifestyle modifications for his multiple health conditions.   Objective:   Blood pressure (!) 143/73, pulse 70, temperature 98.5 F (36.9 C), height 5\' 11"  (1.803 m), weight 292 lb (132.5 kg), SpO2 98 %. Body mass index is 40.73 kg/m.  General: Cooperative, alert, well developed, in no acute distress. HEENT: Conjunctivae and lids unremarkable. Cardiovascular: Regular rhythm.  Lungs: Normal work of breathing. Neurologic: No focal deficits.   Lab Results  Component Value Date   CREATININE 0.89 05/31/2020   BUN 18 05/31/2020   NA 142 05/31/2020   K 4.4 05/31/2020   CL 107 (H) 05/31/2020  CO2 23 05/31/2020   Lab Results  Component Value Date   ALT 15 05/31/2020   AST 14 05/31/2020   ALKPHOS 40 (L) 05/31/2020   BILITOT 0.5 05/31/2020   Lab Results  Component Value Date   HGBA1C 5.9 (H) 05/31/2020   HGBA1C 5.8 (H) 02/04/2020   HGBA1C 5.6 09/16/2019   HGBA1C 5.8 06/05/2019    HGBA1C 5.7 (H) 12/25/2018   Lab Results  Component Value Date   INSULIN 11.1 05/31/2020   INSULIN 6.7 02/04/2020   INSULIN 9.5 12/25/2018   Lab Results  Component Value Date   TSH 0.920 12/25/2018   Lab Results  Component Value Date   CHOL 134 05/31/2020   HDL 45 05/31/2020   LDLCALC 77 05/31/2020   TRIG 53 05/31/2020   CHOLHDL 3.0 05/31/2020   Lab Results  Component Value Date   WBC 6.5 12/25/2018   HGB 14.5 12/25/2018   HCT 45.1 12/25/2018   MCV 84 12/25/2018   PLT 221.0 06/28/2017   No results found for: IRON, TIBC, FERRITIN  Obesity Behavioral Intervention:   Approximately 15 minutes were spent on the discussion below.  ASK: We discussed the diagnosis of obesity with Daniel Powers today and Daniel Powers agreed to give [name] permission to discuss obesity behavioral modification therapy today.  ASSESS: Daniel Powers has the diagnosis of obesity and his BMI today is 40.74. Daniel Powers is in the action stage of change.   ADVISE: Daniel Powers was educated on the multiple health risks of obesity as well as the benefit of weight loss to improve his health. He was advised of the need for long term treatment and the importance of lifestyle modifications to improve his current health and to decrease his risk of future health problems.  AGREE: Multiple dietary modification options and treatment options were discussed and Daniel Powers agreed to follow the recommendations documented in the above note.  ARRANGE: Daniel Powers was educated on the importance of frequent visits to treat obesity as outlined per CMS and USPSTF guidelines and agreed to schedule his next follow up appointment today.  Attestation Statements:   Reviewed by clinician on day of visit: allergies, medications, problem list, medical history, surgical history, family history, social history, and previous encounter notes.   [name], am acting as transcriptionist for [employer], PA-C.  I have reviewed the above documentation for  accuracy and completeness, and I agree with the above. Abby Potash, PA-C

## 2020-09-24 DIAGNOSIS — E113293 Type 2 diabetes mellitus with mild nonproliferative diabetic retinopathy without macular edema, bilateral: Secondary | ICD-10-CM | POA: Diagnosis not present

## 2020-09-24 DIAGNOSIS — H2513 Age-related nuclear cataract, bilateral: Secondary | ICD-10-CM | POA: Diagnosis not present

## 2020-09-27 ENCOUNTER — Other Ambulatory Visit: Payer: Self-pay | Admitting: Family Medicine

## 2020-09-27 DIAGNOSIS — E785 Hyperlipidemia, unspecified: Secondary | ICD-10-CM

## 2020-09-30 ENCOUNTER — Encounter (INDEPENDENT_AMBULATORY_CARE_PROVIDER_SITE_OTHER): Payer: Self-pay | Admitting: Physician Assistant

## 2020-09-30 ENCOUNTER — Other Ambulatory Visit: Payer: Self-pay

## 2020-09-30 ENCOUNTER — Ambulatory Visit (INDEPENDENT_AMBULATORY_CARE_PROVIDER_SITE_OTHER): Payer: Medicare HMO | Admitting: Physician Assistant

## 2020-09-30 ENCOUNTER — Other Ambulatory Visit (INDEPENDENT_AMBULATORY_CARE_PROVIDER_SITE_OTHER): Payer: Self-pay | Admitting: Physician Assistant

## 2020-09-30 VITALS — BP 133/80 | HR 79 | Temp 98.5°F | Ht 71.0 in | Wt 291.0 lb

## 2020-09-30 DIAGNOSIS — E559 Vitamin D deficiency, unspecified: Secondary | ICD-10-CM

## 2020-09-30 DIAGNOSIS — E1169 Type 2 diabetes mellitus with other specified complication: Secondary | ICD-10-CM | POA: Diagnosis not present

## 2020-09-30 DIAGNOSIS — Z6841 Body Mass Index (BMI) 40.0 and over, adult: Secondary | ICD-10-CM

## 2020-09-30 DIAGNOSIS — E785 Hyperlipidemia, unspecified: Secondary | ICD-10-CM

## 2020-09-30 DIAGNOSIS — E119 Type 2 diabetes mellitus without complications: Secondary | ICD-10-CM

## 2020-09-30 MED ORDER — VITAMIN D (ERGOCALCIFEROL) 1.25 MG (50000 UNIT) PO CAPS: 50000.0000 [IU] | ORAL_CAPSULE | ORAL | 0 refills | Status: DC

## 2020-10-01 LAB — COMPREHENSIVE METABOLIC PANEL
ALT: 17 IU/L (ref 0–44)
AST: 15 IU/L (ref 0–40)
Albumin/Globulin Ratio: 1.8 (ref 1.2–2.2)
Albumin: 4.1 g/dL (ref 3.8–4.8)
Alkaline Phosphatase: 39 IU/L — ABNORMAL LOW (ref 44–121)
BUN/Creatinine Ratio: 18 (ref 10–24)
BUN: 16 mg/dL (ref 8–27)
Bilirubin Total: 0.6 mg/dL (ref 0.0–1.2)
CO2: 20 mmol/L (ref 20–29)
Calcium: 9 mg/dL (ref 8.6–10.2)
Chloride: 105 mmol/L (ref 96–106)
Creatinine, Ser: 0.88 mg/dL (ref 0.76–1.27)
Globulin, Total: 2.3 g/dL (ref 1.5–4.5)
Glucose: 84 mg/dL (ref 65–99)
Potassium: 4.3 mmol/L (ref 3.5–5.2)
Sodium: 140 mmol/L (ref 134–144)
Total Protein: 6.4 g/dL (ref 6.0–8.5)
eGFR: 95 mL/min/{1.73_m2} (ref >59)

## 2020-10-01 LAB — HEMOGLOBIN A1C
Est. average glucose Bld gHb Est-mCnc: 117 mg/dL
Hgb A1c MFr Bld: 5.7 % — ABNORMAL HIGH (ref 4.8–5.6)

## 2020-10-01 LAB — INSULIN, RANDOM: INSULIN: 7.6 u[IU]/mL (ref 2.6–24.9)

## 2020-10-01 LAB — VITAMIN D 25 HYDROXY (VIT D DEFICIENCY, FRACTURES): Vit D, 25-Hydroxy: 58.8 ng/mL (ref 30.0–100.0)

## 2020-10-01 LAB — LIPID PANEL
Chol/HDL Ratio: 2.9 ratio (ref 0.0–5.0)
Cholesterol, Total: 126 mg/dL (ref 100–199)
HDL: 43 mg/dL (ref >39)
LDL Chol Calc (NIH): 71 mg/dL (ref 0–99)
Triglycerides: 52 mg/dL (ref 0–149)
VLDL Cholesterol Cal: 12 mg/dL (ref 5–40)

## 2020-10-06 NOTE — Progress Notes (Signed)
Chief Complaint:   OBESITY Lev is here to discuss his progress with his obesity treatment plan along with follow-up of his obesity related diagnoses. Keyvin is on the Category 4 Plan and states he is following his eating plan approximately 75% of the time. Kadyn states he is doing 0 minutes 0 times per week.  Today's visit was #: 36 Starting weight: 309 lbs Starting date: 12/25/2018 Today's weight: 291 lbs Today's date: 09/30/2020 Total lbs lost to date: 18 Total lbs lost since last in-office visit: 1  Interim History: Zayven did well with weight loss. He is eating a protein bar and having a protein shake for lunch due to a busy schedule at work. He is not exercising, but he is willing to start walking outside.  Subjective:   1. Vitamin D deficiency Athel is on Vit D weekly, and he is tolerating it well.  2. Type 2 diabetes mellitus without complication, without long-term current use of insulin ([place]) Kingston's diabetes mellitus is well controlled. He is not on medications, and he denies polyphagia or hypoglycemia.  3. Hyperlipidemia associated with type 2 diabetes mellitus ([place]) Carlito is on Lipitor and fish oil. He denies chest pain or myalgias. He is not currently exercising.  Assessment/Plan:   1. Vitamin D deficiency Low Vitamin D level contributes to fatigue and are associated with obesity, breast, and colon cancer. We will refill prescription Vitamin D for 1 month. We will check labs today. Calob will follow-up for routine testing of Vitamin D, at least 2-3 times per year to avoid over-replacement.  - Vitamin D, Ergocalciferol, (DRISDOL) 1.25 MG (50000 UNIT) CAPS capsule; Take 1 capsule (50,000 Units total) by mouth every 7 (seven) days.  Dispense: 4 capsule; Refill: 0 - VITAMIN D 25 Hydroxy (Vit-D Deficiency, Fractures)  2. Type 2 diabetes mellitus without complication, without long-term current use of insulin ([facility]) Good blood sugar control is important to  decrease the likelihood of diabetic complications such as nephropathy, neuropathy, limb loss, blindness, coronary artery disease, and death. Intensive lifestyle modification including diet, exercise and weight loss are the first line of treatment for diabetes. We will check labs today, and Daemion will continue his meal plan.  - Comprehensive metabolic panel - Hemoglobin A1c - Insulin, random  3. Hyperlipidemia associated with type 2 diabetes mellitus ([place]) Cardiovascular risk and specific lipid/LDL goals reviewed. We discussed several lifestyle modifications today. We will check labs today. Graysin will continue his medications, and will start walking for exercise. Orders and follow up as documented in patient record.   Counseling Intensive lifestyle modifications are the first line treatment for this issue. . Dietary changes: Increase soluble fiber. Decrease simple carbohydrates. . Exercise changes: Moderate to vigorous-intensity aerobic activity 150 minutes per week if tolerated. . Lipid-lowering medications: see documented in medical record.  - Lipid panel  4. Class 3 severe obesity with serious comorbidity and body mass index (BMI) of 40.0 to 44.9 in adult, unspecified obesity type [facility]) Bradyn is currently in the action stage of change. As such, his goal is to continue with weight loss efforts. He has agreed to the Category 4 Plan.   Temitope is to add tuna packs as a convenient option for lunch.  Exercise goals: No exercise has been prescribed at this time.  Behavioral modification strategies: increasing lean protein intake and meal planning and cooking strategies.  Rozell has agreed to follow-up with our clinic in 3 weeks. He was informed of the importance of frequent follow-up visits to  maximize his success with intensive lifestyle modifications for his multiple health conditions.   Rhet was informed we would discuss his lab results at his next visit unless there is a critical  issue that needs to be addressed sooner. Yong agreed to keep his next visit at the agreed upon time to discuss these results.  Objective:   Blood pressure 133/80, pulse 79, temperature 98.5 F (36.9 C), height 5\' 11"  (1.803 m), weight 291 lb (132 kg), SpO2 97 %. Body mass index is 40.59 kg/m.  General: Cooperative, alert, well developed, in no acute distress. HEENT: Conjunctivae and lids unremarkable. Cardiovascular: Regular rhythm.  Lungs: Normal work of breathing. Neurologic: No focal deficits.   Lab Results  Component Value Date   CREATININE 0.88 09/30/2020   BUN 16 09/30/2020   NA 140 09/30/2020   K 4.3 09/30/2020   CL 105 09/30/2020   CO2 20 09/30/2020   Lab Results  Component Value Date   ALT 17 09/30/2020   AST 15 09/30/2020   ALKPHOS 39 (L) 09/30/2020   BILITOT 0.6 09/30/2020   Lab Results  Component Value Date   HGBA1C 5.7 (H) 09/30/2020   HGBA1C 5.9 (H) 05/31/2020   HGBA1C 5.8 (H) 02/04/2020   HGBA1C 5.6 09/16/2019   HGBA1C 5.8 06/05/2019   Lab Results  Component Value Date   INSULIN 7.6 09/30/2020   INSULIN 11.1 05/31/2020   INSULIN 6.7 02/04/2020   INSULIN 9.5 12/25/2018   Lab Results  Component Value Date   TSH 0.920 12/25/2018   Lab Results  Component Value Date   CHOL 126 09/30/2020   HDL 43 09/30/2020   LDLCALC 71 09/30/2020   TRIG 52 09/30/2020   CHOLHDL 2.9 09/30/2020   Lab Results  Component Value Date   WBC 6.5 12/25/2018   HGB 14.5 12/25/2018   HCT 45.1 12/25/2018   MCV 84 12/25/2018   PLT 221.0 06/28/2017   No results found for: IRON, TIBC, FERRITIN  Obesity Behavioral Intervention:   Approximately 15 minutes were spent on the discussion below.  ASK: We discussed the diagnosis of obesity with [name] today and Nazario agreed to give [name] permission to discuss obesity behavioral modification therapy today.  ASSESS: Yousuf has the diagnosis of obesity and his BMI today is 40.6. Arieon is in the action stage of change.    ADVISE: Karion was educated on the multiple health risks of obesity as well as the benefit of weight loss to improve his health. He was advised of the need for long term treatment and the importance of lifestyle modifications to improve his current health and to decrease his risk of future health problems.  AGREE: Multiple dietary modification options and treatment options were discussed and Keaden agreed to follow the recommendations documented in the above note.  ARRANGE: Traveion was educated on the importance of frequent visits to treat obesity as outlined per CMS and USPSTF guidelines and agreed to schedule his next follow up appointment today.  Attestation Statements:   Reviewed by clinician on day of visit: allergies, medications, problem list, medical history, surgical history, family history, social history, and previous encounter notes.   [name], am acting as transcriptionist for [employer], PA-C.  I have reviewed the above documentation for accuracy and completeness, and I agree with the above. Abby Potash, PA-C

## 2020-10-23 DIAGNOSIS — J3089 Other allergic rhinitis: Secondary | ICD-10-CM | POA: Diagnosis not present

## 2020-10-23 DIAGNOSIS — Z1152 Encounter for screening for COVID-19: Secondary | ICD-10-CM | POA: Diagnosis not present

## 2020-10-23 DIAGNOSIS — R051 Acute cough: Secondary | ICD-10-CM | POA: Diagnosis not present

## 2020-10-25 ENCOUNTER — Encounter (INDEPENDENT_AMBULATORY_CARE_PROVIDER_SITE_OTHER): Payer: Self-pay

## 2020-10-26 ENCOUNTER — Ambulatory Visit (INDEPENDENT_AMBULATORY_CARE_PROVIDER_SITE_OTHER): Payer: Medicare HMO | Admitting: Family Medicine

## 2020-10-26 ENCOUNTER — Ambulatory Visit (HOSPITAL_BASED_OUTPATIENT_CLINIC_OR_DEPARTMENT_OTHER)
Admission: RE | Admit: 2020-10-26 | Discharge: 2020-10-26 | Disposition: A | Discharge status: Home / Self Care | Payer: Medicare HMO | Source: Ambulatory Visit | Attending: Family Medicine | Admitting: Family Medicine

## 2020-10-26 ENCOUNTER — Ambulatory Visit (INDEPENDENT_AMBULATORY_CARE_PROVIDER_SITE_OTHER): Payer: Medicare HMO | Admitting: Physician Assistant

## 2020-10-26 ENCOUNTER — Other Ambulatory Visit: Payer: Self-pay

## 2020-10-26 ENCOUNTER — Encounter: Payer: Self-pay | Admitting: Family Medicine

## 2020-10-26 VITALS — BP 142/74 | HR 81 | Temp 98.1°F | Resp 18 | Wt 295.0 lb

## 2020-10-26 DIAGNOSIS — R059 Cough, unspecified: Secondary | ICD-10-CM | POA: Diagnosis not present

## 2020-10-26 DIAGNOSIS — J40 Bronchitis, not specified as acute or chronic: Secondary | ICD-10-CM

## 2020-10-26 MED ORDER — HYDROCODONE-HOMATROPINE 5-1.5 MG/5ML PO SYRP: 5.0000 mL | ORAL_SOLUTION | Freq: Three times a day (TID) | ORAL | 0 refills | Status: DC | PRN

## 2020-10-26 NOTE — Patient Instructions (Signed)

## 2020-10-26 NOTE — Progress Notes (Signed)
Subjective:   By signing my name below, I, Daniel Powers, attest that this documentation has been prepared under the direction and in the presence of Dr. Roma Schanz R  10/26/2020    Patient ID: Daniel Powers, male    DOB: 07-09-1956, 65 y.o.   MRN: [id number]  Chief Complaint  Patient presents with  . Cough    Seen at [facility]. Prescribed Tessalon and Azithromycin. Covid was neg. Sxs started last wed.    HPI Patient is in today for an office visit. He reports being dx with bronchitis at the urgent care for cough and congestions. He was given a Z-pak this Saturday(4/9). He reports that when he coughs he feels "needles" in his chest, but after taking the Z-pak it has resolved. However he is still having difficulty sleeping due to his cough and is requesting for new medication to help with these symptoms. He denies having ay fever, chills, chest pain, tightness, or pressure. He has no nausea, vomiting, or diarrhea at this time. His COVID test was negative.  Past Medical History:  Diagnosis Date  . Anxiety   . Arthritis   . Asthma   . COPD (chronic obstructive pulmonary disease) ([place])   . DVT (deep venous thrombosis) ([place])   . Gout   . High cholesterol   . Hypertension   . Influenza with pneumonia   . Lactose intolerance   . Obesity   . Pre-diabetes   . Psychosexual dysfunction with inhibited sexual excitement     Past Surgical History:  Procedure Laterality Date  . HAND SURGERY     right  . TONSILLECTOMY AND ADENOIDECTOMY      Family History  Problem Relation Age of Onset  . Hypertension Mother   . Alzheimer's disease Mother   . Hypertension Father   . Alcohol abuse Father   . Obesity Father   . Cancer Sister        "male organs"  . Lung cancer Maternal Uncle   . Esophageal cancer Maternal Uncle   . Hypertension Maternal Grandmother   . Alzheimer's disease Maternal Grandmother   . Hypertension Maternal Grandfather   . Hypertension Paternal Grandmother   .  Hypertension Paternal Grandfather   . Heart disease Brother 69       MI  . Alzheimer's disease Paternal Aunt   . Colon cancer Neg Hx     Social History   Socioeconomic History  . Marital status: Married    Spouse name: Daniel Powers  . Number of children: 1  . Years of education: Not on file  . Highest education level: Not on file  Occupational History  . 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 Smoking status: Never Smoker  . Smokeless tobacco: Never Used  Substance and Sexual Activity  . Alcohol use: Yes    Comment: ocass- once or twice a month  . Drug use: No  . Sexual activity: Yes    Partners: Female  Other Topics Concern  . Not on file  Social History Narrative   Exercise --a little-- walking    Social Determinants of Health   Financial Resource Strain: Not on file  Food Insecurity: Not on file  Transportation Needs: Not on file  Physical Activity: Not on file  Stress: Not on file  Social Connections: Not on file  Intimate Partner Violence: Not on file    Outpatient Medications Prior to Visit  Medication Sig Dispense Refill  .  albuterol (PROAIR HFA) 108 (90 Base) MCG/ACT inhaler Inhale 2 puffs into the lungs every 6 (six) hours as needed for wheezing. 1 Inhaler 2  . amLODipine (NORVASC) 5 MG tablet TAKE 1 TABLET(5 MG) BY MOUTH DAILY 90 tablet 1  . atorvastatin (LIPITOR) 10 MG tablet TAKE 1 TABLET(10 MG) BY MOUTH DAILY AT 6 PM 90 tablet 1  . cetirizine (ZYRTEC) 10 MG tablet Take 10 mg by mouth daily.    [name] CINNAMON PO Take 1 tablet by mouth daily.    [name] ELIQUIS 5 MG TABS tablet TAKE 1 TABLET(5 MG) BY MOUTH TWICE DAILY 60 tablet 5  . fish oil-omega-3 fatty acids 1000 MG capsule Take 1 g by mouth daily.    . Flaxseed, Linseed, 1000 MG CAPS Take 1 capsule by mouth daily.    . fluticasone (FLONASE) 50 MCG/ACT nasal spray Place 2 sprays into both nostrils daily. 16 g 6  . olmesartan (BENICAR) 40 MG tablet TAKE 1 TABLET(40 MG) BY MOUTH DAILY 90  tablet 1  . tadalafil (CIALIS) 20 MG tablet Take 20 mg by mouth daily as needed.    . Vitamin D, Ergocalciferol, (DRISDOL) 1.25 MG (50000 UNIT) CAPS capsule Take 1 capsule (50,000 Units total) by mouth every 7 (seven) days. 4 capsule 0   Facility-Administered Medications Prior to Visit  Medication Dose Route Frequency Provider Last Rate Last Admin  . 0.9 %  sodium chloride infusion  500 mL Intravenous Continuous Armbruster, [name], MD        Allergies  Allergen Reactions  . Penicillins     REACTION: Penicillin    Review of Systems  Constitutional: Negative for chills and fever.  HENT: Positive for congestion. Negative for ear pain and sore throat.   Respiratory: Positive for cough. Negative for shortness of breath and wheezing.   Cardiovascular: Negative for chest pain and palpitations.  Gastrointestinal: Negative for abdominal pain, diarrhea, nausea and vomiting.  Neurological: Negative for dizziness, tingling, weakness and headaches.  Psychiatric/Behavioral:       (+)Difficulty sleeping due to cough and congestion       Objective:    Physical Exam Constitutional:      General: He is not in acute distress.    Appearance: Normal appearance. He is not ill-appearing.  HENT:     Head: Normocephalic and atraumatic.     Right Ear: External ear normal.     Left Ear: External ear normal.  Eyes:     Extraocular Movements: Extraocular movements intact.     Pupils: Pupils are equal, round, and reactive to light.  Cardiovascular:     Rate and Rhythm: Normal rate and regular rhythm.     Pulses: Normal pulses.     Heart sounds: Normal heart sounds. No murmur heard.   Pulmonary:     Effort: Pulmonary effort is normal.     Breath sounds: Normal breath sounds.  Neurological:     Mental Status: He is alert.     BP (!) 142/74 (BP Location: Left Arm, Patient Position: Sitting, Cuff Size: Large)   Pulse 81   Temp 98.1 F (36.7 C) (Oral)   Resp 18   Wt 295 lb (133.8 kg)    SpO2 97%   BMI 41.14 kg/m  Wt Readings from Last 3 Encounters:  10/26/20 295 lb (133.8 kg)  09/30/20 291 lb (132 kg)  09/09/20 292 lb (132.5 kg)    Diabetic Foot Exam - Simple   No data filed    Lab Results  Component Value Date   WBC 6.5 12/25/2018   HGB 14.5 12/25/2018   HCT 45.1 12/25/2018   PLT 221.0 06/28/2017   GLUCOSE 84 09/30/2020   CHOL 126 09/30/2020   TRIG 52 09/30/2020   HDL 43 09/30/2020   LDLCALC 71 09/30/2020   ALT 17 09/30/2020   AST 15 09/30/2020   NA 140 09/30/2020   K 4.3 09/30/2020   CL 105 09/30/2020   CREATININE 0.88 09/30/2020   BUN 16 09/30/2020   CO2 20 09/30/2020   TSH 0.920 12/25/2018   PSA 1.31 06/28/2017   INR 1.8 (A) 09/16/2019   HGBA1C 5.7 (H) 09/30/2020   MICROALBUR 1.1 06/05/2019    Lab Results  Component Value Date   TSH 0.920 12/25/2018   Lab Results  Component Value Date   WBC 6.5 12/25/2018   HGB 14.5 12/25/2018   HCT 45.1 12/25/2018   MCV 84 12/25/2018   PLT 221.0 06/28/2017   Lab Results  Component Value Date   NA 140 09/30/2020   K 4.3 09/30/2020   CO2 20 09/30/2020   GLUCOSE 84 09/30/2020   BUN 16 09/30/2020   CREATININE 0.88 09/30/2020   BILITOT 0.6 09/30/2020   ALKPHOS 39 (L) 09/30/2020   AST 15 09/30/2020   ALT 17 09/30/2020   PROT 6.4 09/30/2020   ALBUMIN 4.1 09/30/2020   CALCIUM 9.0 09/30/2020   GFR 89.68 09/16/2019   Lab Results  Component Value Date   CHOL 126 09/30/2020   Lab Results  Component Value Date   HDL 43 09/30/2020   Lab Results  Component Value Date   LDLCALC 71 09/30/2020   Lab Results  Component Value Date   TRIG 52 09/30/2020   Lab Results  Component Value Date   CHOLHDL 2.9 09/30/2020   Lab Results  Component Value Date   HGBA1C 5.7 (H) 09/30/2020       Assessment & Plan:   Problem List Items Addressed This Visit   None   Visit Diagnoses    Bronchitis    -  Primary   Relevant Medications   HYDROcodone-homatropine (HYCODAN) 5-1.5 MG/5ML syrup   Other  Relevant Orders   DG Chest 2 View    finish z pak rx cough med for night time Check cxr Use mucinex or delsym during day time    Meds ordered this encounter  Medications  . DISCONTD: HYDROcodone-homatropine (HYCODAN) 5-1.5 MG/5ML syrup    Sig: Take 5 mLs by mouth every 8 (eight) hours as needed for cough.    Dispense:  473 each    Refill:  0  . HYDROcodone-homatropine (HYCODAN) 5-1.5 MG/5ML syrup    Sig: Take 5 mLs by mouth every 8 (eight) hours as needed for cough.    Dispense:  120 mL    Refill:  0    I, Ann Held, DO, personally preformed the services described in this documentation.  All medical record entries made by the scribe were at my direction and in my presence.  I have reviewed the chart and discharge instructions (if applicable) and agree that the record reflects my personal performance and is accurate and complete. 10/26/2020  I,Alexis Bryant,acting as a scribe for Ann Held, DO.,have documented all relevant documentation on the behalf of Ann Held, DO,as directed by  Ann Held, DO while in the presence of Ann Held, DO.  Ann Held, DO

## 2020-10-27 ENCOUNTER — Other Ambulatory Visit: Payer: Self-pay | Admitting: Family Medicine

## 2020-10-27 DIAGNOSIS — I82401 Acute embolism and thrombosis of unspecified deep veins of right lower extremity: Secondary | ICD-10-CM

## 2020-11-16 ENCOUNTER — Ambulatory Visit (INDEPENDENT_AMBULATORY_CARE_PROVIDER_SITE_OTHER): Payer: Medicare HMO | Admitting: Physician Assistant

## 2020-11-16 ENCOUNTER — Encounter (INDEPENDENT_AMBULATORY_CARE_PROVIDER_SITE_OTHER): Payer: Self-pay | Admitting: Physician Assistant

## 2020-11-16 ENCOUNTER — Other Ambulatory Visit: Payer: Self-pay

## 2020-11-16 ENCOUNTER — Other Ambulatory Visit (INDEPENDENT_AMBULATORY_CARE_PROVIDER_SITE_OTHER): Payer: Self-pay | Admitting: Physician Assistant

## 2020-11-16 VITALS — BP 131/73 | HR 69 | Temp 97.9°F | Ht 71.0 in | Wt 292.0 lb

## 2020-11-16 DIAGNOSIS — Z6841 Body Mass Index (BMI) 40.0 and over, adult: Secondary | ICD-10-CM | POA: Diagnosis not present

## 2020-11-16 DIAGNOSIS — E559 Vitamin D deficiency, unspecified: Secondary | ICD-10-CM

## 2020-11-16 MED ORDER — VITAMIN D (ERGOCALCIFEROL) 1.25 MG (50000 UNIT) PO CAPS: 50000.0000 [IU] | ORAL_CAPSULE | ORAL | 0 refills | Status: DC

## 2020-11-17 ENCOUNTER — Ambulatory Visit: Payer: Medicare HMO | Attending: Internal Medicine

## 2020-11-17 DIAGNOSIS — Z23 Encounter for immunization: Secondary | ICD-10-CM

## 2020-11-17 NOTE — Progress Notes (Signed)
   Covid-19 Vaccination Clinic  Name:  Daniel Powers    MRN: [id number] DOB: 1956/03/11  11/17/2020  Daniel Powers was observed post Covid-19 immunization for 30 minutes based on pre-vaccination screening without incident. He was provided with Vaccine Information Sheet and instruction to access the V-Safe system.   Daniel Powers was instructed to call 911 with any severe reactions post vaccine: [name] Difficulty breathing  . Swelling of face and throat  . A fast heartbeat  . A bad rash all over body  . Dizziness and weakness   Immunizations Administered    Name Date Dose VIS Date Route   PFIZER Comrnaty(Gray TOP) Covid-19 Vaccine 11/17/2020  9:41 AM 0.3 mL 06/24/2020 Intramuscular   Manufacturer: [employer], [employer]   Lot: [id number]   NDC: [phone]

## 2020-11-17 NOTE — Progress Notes (Signed)
**Note De-Identified Daniel Obfuscation** Chief Complaint:   OBESITY Daniel Powers is here to discuss his progress with his obesity treatment plan along with follow-up of his obesity related diagnoses. Daniel Powers is on the Category 4 Plan and states he is following his eating plan approximately 75% of the time. Daniel Powers states he is doing 0 minutes 0 times per week.  Today's visit was #: 27 Starting weight: 309 lbs Starting date: 12/25/2018 Today's weight: 292 lbs Today's date: 11/16/2020 Total lbs lost to date: 17 Total lbs lost since last in-office visit: 0  Interim History: Daniel Powers reports that he had bronchitis, and he has had some issues with his teeth and had to eat some soft foods. He will be getting oral implants soon. I spoke with him regarding medications to help with weight loss today, but he declines.  Subjective:   1. Vitamin D deficiency Daniel Powers is on Vit D, and he denies nausea, vomiting, or muscle weakness.  Assessment/Plan:   1. Vitamin D deficiency Low Vitamin D level contributes to fatigue and are associated with obesity, breast, and colon cancer. We will refill prescription Vitamin D for 1 month. Daniel Powers will follow-up for routine testing of Vitamin D, at least 2-3 times per year to avoid over-replacement.  - Vitamin D, Ergocalciferol, (DRISDOL) 1.25 MG (50000 UNIT) CAPS capsule; Take 1 capsule (50,000 Units total) by mouth every 7 (seven) days.  Dispense: 4 capsule; Refill: 0  2. Class 3 severe obesity with serious comorbidity and body mass index (BMI) of 40.0 to 44.9 in adult, unspecified obesity type [facility] Daniel Powers) Daniel Powers is currently in the action stage of change. As such, his goal is to continue with weight loss efforts. He has agreed to the Category 4 Plan.   We will recheck his IC at his next visit. [employer] meal prep information was given today.  Exercise goals: No exercise has been prescribed at this time.  Behavioral modification strategies: meal planning and cooking strategies and keeping healthy foods in the  home.  Daniel Powers has agreed to follow-up with our clinic in 3 weeks. He was informed of the importance of frequent follow-up visits to maximize his success with intensive lifestyle modifications for his multiple health conditions.   Objective:   Blood pressure 131/73, pulse 69, temperature 97.9 F (36.6 C), height 5\' 11"  (1.803 m), weight 292 lb (132.5 kg), SpO2 99 %. Body mass index is 40.73 kg/m.  General: Cooperative, alert, well developed, in no acute distress. HEENT: Conjunctivae and lids unremarkable. Cardiovascular: Regular rhythm.  Lungs: Normal work of breathing. Neurologic: No focal deficits.   Lab Results  Component Value Date   CREATININE 0.88 09/30/2020   BUN 16 09/30/2020   NA 140 09/30/2020   K 4.3 09/30/2020   CL 105 09/30/2020   CO2 20 09/30/2020   Lab Results  Component Value Date   ALT 17 09/30/2020   AST 15 09/30/2020   ALKPHOS 39 (L) 09/30/2020   BILITOT 0.6 09/30/2020   Lab Results  Component Value Date   HGBA1C 5.7 (H) 09/30/2020   HGBA1C 5.9 (H) 05/31/2020   HGBA1C 5.8 (H) 02/04/2020   HGBA1C 5.6 09/16/2019   HGBA1C 5.8 06/05/2019   Lab Results  Component Value Date   INSULIN 7.6 09/30/2020   INSULIN 11.1 05/31/2020   INSULIN 6.7 02/04/2020   INSULIN 9.5 12/25/2018   Lab Results  Component Value Date   TSH 0.920 12/25/2018   Lab Results  Component Value Date   CHOL 126 09/30/2020   HDL 43 09/30/2020  LDLCALC 71 09/30/2020   TRIG 52 09/30/2020   CHOLHDL 2.9 09/30/2020   Lab Results  Component Value Date   WBC 6.5 12/25/2018   HGB 14.5 12/25/2018   HCT 45.1 12/25/2018   MCV 84 12/25/2018   PLT 221.0 06/28/2017   No results found for: IRON, TIBC, FERRITIN  Obesity Behavioral Intervention:   Approximately 15 minutes were spent on the discussion below.  ASK: We discussed the diagnosis of obesity with Daniel Powers today and Daniel Powers agreed to give [name] permission to discuss obesity behavioral modification therapy  today.  ASSESS: Daniel Powers has the diagnosis of obesity and his BMI today is 40.74. Daniel Powers is in the action stage of change.   ADVISE: Daniel Powers was educated on the multiple health risks of obesity as well as the benefit of weight loss to improve his health. He was advised of the need for long term treatment and the importance of lifestyle modifications to improve his current health and to decrease his risk of future health problems.  AGREE: Multiple dietary modification options and treatment options were discussed and Daniel Powers agreed to follow the recommendations documented in the above note.  ARRANGE: Daniel Powers was educated on the importance of frequent visits to treat obesity as outlined per CMS and USPSTF guidelines and agreed to schedule his next follow up appointment today.  Attestation Statements:   Reviewed by clinician on day of visit: allergies, medications, problem list, medical history, surgical history, family history, social history, and previous encounter notes.   [name], am acting as transcriptionist for [employer], PA-C.  I have reviewed the above documentation for accuracy and completeness, and I agree with the above. Abby Potash, PA-C

## 2020-11-23 ENCOUNTER — Other Ambulatory Visit (HOSPITAL_BASED_OUTPATIENT_CLINIC_OR_DEPARTMENT_OTHER): Payer: Self-pay

## 2020-11-23 MED ORDER — PFIZER-BIONT COVID-19 VAC-TRIS 30 MCG/0.3ML IM SUSP
INTRAMUSCULAR | 0 refills | Status: DC
  Filled 2020-11-23: qty 0.3, 1d supply, fill #0

## 2020-12-06 ENCOUNTER — Encounter: Payer: Self-pay | Admitting: Family Medicine

## 2020-12-06 ENCOUNTER — Ambulatory Visit (INDEPENDENT_AMBULATORY_CARE_PROVIDER_SITE_OTHER): Payer: Medicare HMO | Admitting: Family Medicine

## 2020-12-06 ENCOUNTER — Other Ambulatory Visit: Payer: Self-pay

## 2020-12-06 VITALS — BP 120/74 | HR 77 | Temp 98.7°F | Resp 18 | Ht 71.0 in | Wt 295.0 lb

## 2020-12-06 DIAGNOSIS — E785 Hyperlipidemia, unspecified: Secondary | ICD-10-CM | POA: Diagnosis not present

## 2020-12-06 DIAGNOSIS — E119 Type 2 diabetes mellitus without complications: Secondary | ICD-10-CM

## 2020-12-06 DIAGNOSIS — Z0000 Encounter for general adult medical examination without abnormal findings: Secondary | ICD-10-CM

## 2020-12-06 DIAGNOSIS — I10 Essential (primary) hypertension: Secondary | ICD-10-CM

## 2020-12-06 DIAGNOSIS — E1169 Type 2 diabetes mellitus with other specified complication: Secondary | ICD-10-CM | POA: Diagnosis not present

## 2020-12-06 DIAGNOSIS — E1165 Type 2 diabetes mellitus with hyperglycemia: Secondary | ICD-10-CM | POA: Diagnosis not present

## 2020-12-06 DIAGNOSIS — Z23 Encounter for immunization: Secondary | ICD-10-CM

## 2020-12-06 DIAGNOSIS — I82401 Acute embolism and thrombosis of unspecified deep veins of right lower extremity: Secondary | ICD-10-CM

## 2020-12-06 LAB — MICROALBUMIN / CREATININE URINE RATIO
Creatinine,U: 198.4 mg/dL
Microalb Creat Ratio: 0.4 mg/g (ref 0.0–30.0)
Microalb, Ur: 0.7 mg/dL (ref 0.0–1.9)

## 2020-12-06 LAB — LIPID PANEL
Cholesterol: 120 mg/dL (ref 0–200)
HDL: 40.7 mg/dL (ref >39.00)
LDL Cholesterol: 69 mg/dL (ref 0–99)
NonHDL: 78.91
Total CHOL/HDL Ratio: 3
Triglycerides: 49 mg/dL (ref 0.0–149.0)
VLDL: 9.8 mg/dL (ref 0.0–40.0)

## 2020-12-06 NOTE — Patient Instructions (Signed)
Preventive Care 65 Years and Older, Male Preventive care refers to lifestyle choices and visits with your health care provider that can promote health and wellness. This includes:  A yearly physical exam. This is also called an annual wellness visit.  Regular dental and eye exams.  Immunizations.  Screening for certain conditions.  Healthy lifestyle choices, such as: ? Eating a healthy diet. ? Getting regular exercise. ? Not using drugs or products that contain nicotine and tobacco. ? Limiting alcohol use. What can I expect for my preventive care visit? Physical exam Your health care provider will check your:  Height and weight. These may be used to calculate your BMI (body mass index). BMI is a measurement that tells if you are at a healthy weight.  Heart rate and blood pressure.  Body temperature.  Skin for abnormal spots. Counseling Your health care provider may ask you questions about your:  Past medical problems.  Family's medical history.  Alcohol, tobacco, and drug use.  Emotional well-being.  Home life and relationship well-being.  Sexual activity.  Diet, exercise, and sleep habits.  History of falls.  Memory and ability to understand (cognition).  Work and work environment.  Access to firearms. What immunizations do I need? Vaccines are usually given at various ages, according to a schedule. Your health care provider will recommend vaccines for you based on your age, medical history, and lifestyle or other factors, such as travel or where you work.   What tests do I need? Blood tests  Lipid and cholesterol levels. These may be checked every 5 years, or more often depending on your overall health.  Hepatitis C test.  Hepatitis B test. Screening  Lung cancer screening. You may have this screening every year starting at age 55 if you have a 30-pack-year history of smoking and currently smoke or have quit within the past 15 years.  Colorectal  cancer screening. ? All adults should have this screening starting at age 50 and continuing until age 75. ? Your health care provider may recommend screening at age 45 if you are at increased risk. ? You will have tests every 1-10 years, depending on your results and the type of screening test.  Prostate cancer screening. Recommendations will vary depending on your family history and other risks.  Genital exam to check for testicular cancer or hernias.  Diabetes screening. ? This is done by checking your blood sugar (glucose) after you have not eaten for a while (fasting). ? You may have this done every 1-3 years.  Abdominal aortic aneurysm (AAA) screening. You may need this if you are a current or former smoker.  STD (sexually transmitted disease) testing, if you are at risk. Follow these instructions at home: Eating and drinking  Eat a diet that includes fresh fruits and vegetables, whole grains, lean protein, and low-fat dairy products. Limit your intake of foods with high amounts of sugar, saturated fats, and salt.  Take vitamin and mineral supplements as recommended by your health care provider.  Do not drink alcohol if your health care provider tells you not to drink.  If you drink alcohol: ? Limit how much you have to 0-2 drinks a day. ? Be aware of how much alcohol is in your drink. In the [place] one drink equals one 12 oz bottle of beer (355 mL), one 5 oz glass of wine (148 mL), or one 1 oz glass of hard liquor (44 mL).   Lifestyle  Take daily care of your teeth   and gums. Brush your teeth every morning and night with fluoride toothpaste. Floss one time each day.  Stay active. Exercise for at least 30 minutes 5 or more days each week.  Do not use any products that contain nicotine or tobacco, such as cigarettes, e-cigarettes, and chewing tobacco. If you need help quitting, ask your health care provider.  Do not use drugs.  If you are sexually active, practice safe sex.  Use a condom or other form of protection to prevent STIs (sexually transmitted infections).  Talk with your health care provider about taking a low-dose aspirin or statin.  Find healthy ways to cope with stress, such as: ? Meditation, yoga, or listening to music. ? Journaling. ? Talking to a trusted person. ? Spending time with friends and family. Safety  Always wear your seat belt while driving or riding in a vehicle.  Do not drive: ? If you have been drinking alcohol. Do not ride with someone who has been drinking. ? When you are tired or distracted. ? While texting.  Wear a helmet and other protective equipment during sports activities.  If you have firearms in your house, make sure you follow all gun safety procedures. What's next?  Visit your health care provider once a year for an annual wellness visit.  Ask your health care provider how often you should have your eyes and teeth checked.  Stay up to date on all vaccines. This information is not intended to replace advice given to you by your health care provider. Make sure you discuss any questions you have with your health care provider. Document Revised: 04/01/2019 Document Reviewed: 06/27/2018 Elsevier Patient Education  2021 Elsevier Inc.  

## 2020-12-06 NOTE — Assessment & Plan Note (Signed)
ghm utd Check labs  

## 2020-12-06 NOTE — Assessment & Plan Note (Signed)
Well controlled, no changes to meds. Encouraged heart healthy diet such as the DASH diet and exercise as tolerated.  °

## 2020-12-06 NOTE — Assessment & Plan Note (Signed)
On eliquis

## 2020-12-06 NOTE — Assessment & Plan Note (Signed)
Check labs  con't hww

## 2020-12-06 NOTE — Progress Notes (Signed)
Patient ID: Daniel Powers, male    DOB: 09-Mar-1956  Age: 65 y.o. MRN: [id number]    Subjective:  Subjective  HPI [name] presents for a comprehensive physical examination today. He reports feeling well. He endorses taking 5 mg ELIQUIS PO bid for his dx of DVT. He denies any chest pain, SOB, fever, abdominal pain, cough, chills, sore throat, dysuria, urinary incontinence, back pain, HA, or N/V/D at this time. Pt agrees to a pneumonia vaccination.  He has had his eye exam  Td will need to be done at pharmacy  Review of Systems  Constitutional: Negative for chills, fatigue and fever.  HENT: Negative for ear pain, rhinorrhea, sinus pressure, sinus pain, sore throat and tinnitus.   Eyes: Negative for pain.  Respiratory: Negative for cough, shortness of breath and wheezing.   Cardiovascular: Negative for chest pain.  Gastrointestinal: Negative for abdominal pain, anal bleeding, constipation, diarrhea, nausea and vomiting.  Genitourinary: Negative for flank pain.  Musculoskeletal: Negative for back pain and neck pain.  Skin: Negative for rash.  Neurological: Negative for seizures, weakness, light-headedness, numbness and headaches.    History Past Medical History:  Diagnosis Date  . Anxiety   . Arthritis   . Asthma   . COPD (chronic obstructive pulmonary disease) ([place])   . DVT (deep venous thrombosis) ([place])   . Gout   . High cholesterol   . Hypertension   . Influenza with pneumonia   . Lactose intolerance   . Obesity   . Pre-diabetes   . Psychosexual dysfunction with inhibited sexual excitement     He has a past surgical history that includes Tonsillectomy and adenoidectomy and Hand surgery.   His family history includes Alcohol abuse in his father; Alzheimer's disease in his maternal grandmother, mother, and paternal aunt; Cancer in his sister; Esophageal cancer in his maternal uncle; Heart disease (age of onset: 6) in his brother; Hypertension in his father, maternal  grandfather, maternal grandmother, mother, paternal grandfather, and paternal grandmother; Lung cancer in his maternal uncle; Obesity in his father.He reports that he has never smoked. He has never used smokeless tobacco. He reports current alcohol use. He reports that he does not use drugs.  Current Outpatient Medications on File Prior to Visit  Medication Sig Dispense Refill  . albuterol (PROAIR HFA) 108 (90 Base) MCG/ACT inhaler Inhale 2 puffs into the lungs every 6 (six) hours as needed for wheezing. 1 Inhaler 2  . amLODipine (NORVASC) 5 MG tablet TAKE 1 TABLET(5 MG) BY MOUTH DAILY 90 tablet 1  . atorvastatin (LIPITOR) 10 MG tablet TAKE 1 TABLET(10 MG) BY MOUTH DAILY AT 6 PM 90 tablet 1  . cetirizine (ZYRTEC) 10 MG tablet Take 10 mg by mouth daily.    [name] CINNAMON PO Take 1 tablet by mouth daily.    [name] COVID-19 mRNA Vac-TriS, Pfizer, (PFIZER-BIONT COVID-19 VAC-TRIS) SUSP injection Inject into the muscle. 0.3 mL 0  . ELIQUIS 5 MG TABS tablet TAKE 1 TABLET(5 MG) BY MOUTH TWICE DAILY 60 tablet 5  . fish oil-omega-3 fatty acids 1000 MG capsule Take 1 g by mouth daily.    . Flaxseed, Linseed, 1000 MG CAPS Take 1 capsule by mouth daily.    . fluticasone (FLONASE) 50 MCG/ACT nasal spray Place 2 sprays into both nostrils daily. 16 g 6  . olmesartan (BENICAR) 40 MG tablet TAKE 1 TABLET(40 MG) BY MOUTH DAILY 90 tablet 1  . tadalafil (CIALIS) 20 MG tablet Take 20 mg by mouth daily as  needed.    . Vitamin D, Ergocalciferol, (DRISDOL) 1.25 MG (50000 UNIT) CAPS capsule Take 1 capsule (50,000 Units total) by mouth every 7 (seven) days. 4 capsule 0   Current Facility-Administered Medications on File Prior to Visit  Medication Dose Route Frequency Provider Last Rate Last Admin  . 0.9 %  sodium chloride infusion  500 mL Intravenous Continuous Armbruster, [name], MD         Objective:  Objective  Physical Exam Vitals and nursing note reviewed.  Constitutional:      General: He is not in acute distress.     Appearance: Normal appearance. He is well-developed. He is not ill-appearing.  HENT:     Head: Normocephalic and atraumatic.     Right Ear: External ear normal.     Left Ear: External ear normal.     Nose: Nose normal.  Eyes:     General:        Right eye: No discharge.        Left eye: No discharge.     Extraocular Movements: Extraocular movements intact.     Pupils: Pupils are equal, round, and reactive to light.  Cardiovascular:     Rate and Rhythm: Normal rate and regular rhythm.     Pulses: Normal pulses.     Heart sounds: Normal heart sounds. No murmur heard. No friction rub. No gallop.   Pulmonary:     Effort: Pulmonary effort is normal. No respiratory distress.     Breath sounds: Normal breath sounds. No stridor. No wheezing, rhonchi or rales.  Chest:     Chest wall: No tenderness.  Abdominal:     General: Bowel sounds are normal. There is no distension.     Palpations: Abdomen is soft. There is no mass.     Tenderness: There is no abdominal tenderness. There is no guarding or rebound.     Hernia: No hernia is present.  Genitourinary:    Comments: Per urology Musculoskeletal:        General: Normal range of motion.     Cervical back: Normal range of motion and neck supple.     Right lower leg: No edema.     Left lower leg: No edema.  Skin:    General: Skin is warm and dry.  Neurological:     Mental Status: He is alert and oriented to person, place, and time.  Psychiatric:        Behavior: Behavior normal.        Thought Content: Thought content normal.    Diabetic Foot Exam - Simple   Simple Foot Form Diabetic Foot exam was performed with the following findings: Yes 12/06/2020  8:53 AM  Visual Inspection No deformities, no ulcerations, no other skin breakdown bilaterally: Yes Sensation Testing Intact to touch and monofilament testing bilaterally: Yes Pulse Check Posterior Tibialis and Dorsalis pulse intact bilaterally: Yes Comments     BP 120/74 (BP  Location: Left Arm, Patient Position: Sitting, Cuff Size: Large)   Pulse 77   Temp 98.7 F (37.1 C) (Oral)   Resp 18   Ht 5\' 11"  (1.803 m)   Wt 295 lb (133.8 kg)   SpO2 97%   BMI 41.14 kg/m  Wt Readings from Last 3 Encounters:  12/06/20 295 lb (133.8 kg)  11/16/20 292 lb (132.5 kg)  10/26/20 295 lb (133.8 kg)     Lab Results  Component Value Date   WBC 6.5 12/25/2018   HGB 14.5 12/25/2018  HCT 45.1 12/25/2018   PLT 221.0 06/28/2017   GLUCOSE 84 09/30/2020   CHOL 126 09/30/2020   TRIG 52 09/30/2020   HDL 43 09/30/2020   LDLCALC 71 09/30/2020   ALT 17 09/30/2020   AST 15 09/30/2020   NA 140 09/30/2020   K 4.3 09/30/2020   CL 105 09/30/2020   CREATININE 0.88 09/30/2020   BUN 16 09/30/2020   CO2 20 09/30/2020   TSH 0.920 12/25/2018   PSA 1.31 06/28/2017   INR 1.8 (A) 09/16/2019   HGBA1C 5.7 (H) 09/30/2020   MICROALBUR 1.1 06/05/2019    DG Chest 2 View  Result Date: 10/26/2020 CLINICAL DATA:  Cough and congestion for 1 week EXAM: CHEST - 2 VIEW COMPARISON:  02/01/2018 FINDINGS: The heart size and mediastinal contours are within normal limits. Both lungs are clear. The visualized skeletal structures are unremarkable. IMPRESSION: No active cardiopulmonary disease. Electronically Signed   By: Inez Catalina M.D.   On: 10/26/2020 15:43     Assessment & Plan:  Plan    No orders of the defined types were placed in this encounter.   Problem List Items Addressed This Visit      Unprioritized   Diet-controlled diabetes mellitus ([place])    Check labs  con't hww      Essential hypertension    .Well controlled, no changes to meds. Encouraged heart healthy diet such as the DASH diet and exercise as tolerated.       Hyperlipidemia associated with type 2 diabetes mellitus ([place])    Tolerating statin, encouraged heart healthy diet, avoid trans fats, minimize simple carbs and saturated fats. 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  Follow-up: Return in about 6 months (around 06/08/2021) for hypertension, hyperlipidemia, diabetes II.   I,Gordon Zheng,acting as a scribe for Ann Held, DO.,have documented all relevant documentation on the behalf of Ann Held, DO,as directed by  Ann Held, DO while in the presence of [place], DO, have reviewed all documentation for this visit. The documentation on 12/06/20 for the exam, diagnosis, procedures, and orders are all accurate and complete.

## 2020-12-06 NOTE — Assessment & Plan Note (Signed)
Per endo °

## 2020-12-06 NOTE — Assessment & Plan Note (Signed)
con't HWW 

## 2020-12-06 NOTE — Assessment & Plan Note (Signed)
Tolerating statin, encouraged heart healthy diet, avoid trans fats, minimize simple carbs and saturated fats. Increase exercise as tolerated 

## 2020-12-19 ENCOUNTER — Other Ambulatory Visit (INDEPENDENT_AMBULATORY_CARE_PROVIDER_SITE_OTHER): Payer: Self-pay | Admitting: Physician Assistant

## 2020-12-19 DIAGNOSIS — E559 Vitamin D deficiency, unspecified: Secondary | ICD-10-CM

## 2020-12-20 ENCOUNTER — Ambulatory Visit (INDEPENDENT_AMBULATORY_CARE_PROVIDER_SITE_OTHER): Payer: Medicare HMO | Admitting: Physician Assistant

## 2020-12-20 ENCOUNTER — Other Ambulatory Visit (INDEPENDENT_AMBULATORY_CARE_PROVIDER_SITE_OTHER): Payer: Self-pay | Admitting: Physician Assistant

## 2020-12-20 ENCOUNTER — Other Ambulatory Visit: Payer: Self-pay

## 2020-12-20 VITALS — BP 134/80 | HR 75 | Temp 98.7°F | Ht 71.0 in | Wt 291.0 lb

## 2020-12-20 DIAGNOSIS — R0600 Dyspnea, unspecified: Secondary | ICD-10-CM

## 2020-12-20 DIAGNOSIS — Z6841 Body Mass Index (BMI) 40.0 and over, adult: Secondary | ICD-10-CM

## 2020-12-20 DIAGNOSIS — E559 Vitamin D deficiency, unspecified: Secondary | ICD-10-CM

## 2020-12-20 DIAGNOSIS — R0609 Other forms of dyspnea: Secondary | ICD-10-CM

## 2020-12-20 MED ORDER — VITAMIN D (ERGOCALCIFEROL) 1.25 MG (50000 UNIT) PO CAPS: 50000.0000 [IU] | ORAL_CAPSULE | ORAL | 0 refills | Status: DC

## 2020-12-22 NOTE — Progress Notes (Signed)
Chief Complaint:   OBESITY Daniel Powers is here to discuss his progress with his obesity treatment plan along with follow-up of his obesity related diagnoses. Daniel Powers is on the Category 4 Plan and states he is following his eating plan approximately 50% of the time. Daniel Powers states he is walking for 60 minutes 2 times per week.  Today's visit was #: 55 Starting weight: 309 lbs Starting date: 12/25/2018 Today's weight: 291 lbs Today's date: 12/20/2020 Total lbs lost to date: 18 Total lbs lost since last in-office visit: 1  Interim History: Daniel Powers continues to skip lunch. He has not yet bought the meal prep lunches we spoke about at his last visit. He denies excessive hunger.  Subjective:   1. Vitamin D deficiency Daniel Powers is on Vit D, and he is tolerating it well.  2. Dyspnea on exertion Daniel Powers notes shortness of breath with exertion. He denies dizziness or lightheadedness.  Assessment/Plan:   1. Vitamin D deficiency Low Vitamin D level contributes to fatigue and are associated with obesity, breast, and colon cancer. We will refill prescription Vitamin D for 1 month. Daniel Powers will follow-up for routine testing of Vitamin D, at least 2-3 times per year to avoid over-replacement.  - Vitamin D, Ergocalciferol, (DRISDOL) 1.25 MG (50000 UNIT) CAPS capsule; Take 1 capsule (50,000 Units total) by mouth every 7 (seven) days.  Dispense: 4 capsule; Refill: 0  2. Dyspnea on exertion Daniel Powers was done today. Daniel Powers has agreed to work on weight loss and gradually increase exercise to treat his exercise induced shortness of breath. Will continue to monitor closely.  3. Class 3 severe obesity with serious comorbidity and body mass index (BMI) of 40.0 to 44.9 in adult, unspecified obesity type Daniel Powers) Daniel Powers is currently in the action stage of change. As such, his goal is to continue with weight loss efforts. He has agreed to change to the Category 3 Plan.   Exercise goals: As is.  Behavioral modification  strategies: no skipping meals and meal planning and cooking strategies.  Daniel Powers has agreed to follow-up with our clinic in 3 weeks. He was informed of the importance of frequent follow-up visits to maximize his success with intensive lifestyle modifications for his multiple health conditions.   Objective:   Blood pressure 134/80, pulse 75, temperature 98.7 F (37.1 C), height 5\' 11"  (1.803 m), weight 291 lb (132 kg), SpO2 98 %. Body mass index is 40.59 kg/m.  General: Cooperative, alert, well developed, in no acute distress. HEENT: Conjunctivae and lids unremarkable. Cardiovascular: Regular rhythm.  Lungs: Normal work of breathing. Neurologic: No focal deficits.   Lab Results  Component Value Date   CREATININE 0.88 09/30/2020   BUN 16 09/30/2020   NA 140 09/30/2020   K 4.3 09/30/2020   CL 105 09/30/2020   CO2 20 09/30/2020   Lab Results  Component Value Date   ALT 17 09/30/2020   AST 15 09/30/2020   ALKPHOS 39 (L) 09/30/2020   BILITOT 0.6 09/30/2020   Lab Results  Component Value Date   HGBA1C 5.7 (H) 09/30/2020   HGBA1C 5.9 (H) 05/31/2020   HGBA1C 5.8 (H) 02/04/2020   HGBA1C 5.6 09/16/2019   HGBA1C 5.8 06/05/2019   Lab Results  Component Value Date   INSULIN 7.6 09/30/2020   INSULIN 11.1 05/31/2020   INSULIN 6.7 02/04/2020   INSULIN 9.5 12/25/2018   Lab Results  Component Value Date   TSH 0.920 12/25/2018   Lab Results  Component Value Date   CHOL 120  12/06/2020   HDL 40.70 12/06/2020   LDLCALC 69 12/06/2020   TRIG 49.0 12/06/2020   CHOLHDL 3 12/06/2020   Lab Results  Component Value Date   WBC 6.5 12/25/2018   HGB 14.5 12/25/2018   HCT 45.1 12/25/2018   MCV 84 12/25/2018   PLT 221.0 06/28/2017   No results found for: IRON, TIBC, FERRITIN  Obesity Behavioral Intervention:   Approximately 15 minutes were spent on the discussion below.  ASK: We discussed the diagnosis of obesity with Daniel Powers today and Daniel Powers agreed to give [name] permission to  discuss obesity behavioral modification therapy today.  ASSESS: Daniel Powers has the diagnosis of obesity and his BMI today is 40.6. Daniel Powers is in the action stage of change.   ADVISE: Daniel Powers was educated on the multiple health risks of obesity as well as the benefit of weight loss to improve his health. He was advised of the need for long term treatment and the importance of lifestyle modifications to improve his current health and to decrease his risk of future health problems.  AGREE: Multiple dietary modification options and treatment options were discussed and Daniel Powers agreed to follow the recommendations documented in the above note.  ARRANGE: Daniel Powers was educated on the importance of frequent visits to treat obesity as outlined per CMS and USPSTF guidelines and agreed to schedule his next follow up appointment today.  Attestation Statements:   Reviewed by clinician on day of visit: allergies, medications, problem list, medical history, surgical history, family history, social history, and previous encounter notes.   [name], am acting as transcriptionist for [employer], PA-C.  I have reviewed the above documentation for accuracy and completeness, and I agree with the above. Abby Potash, PA-C

## 2021-01-11 ENCOUNTER — Other Ambulatory Visit: Payer: Self-pay

## 2021-01-11 ENCOUNTER — Ambulatory Visit (INDEPENDENT_AMBULATORY_CARE_PROVIDER_SITE_OTHER): Payer: Medicare HMO | Admitting: Physician Assistant

## 2021-01-11 ENCOUNTER — Encounter (INDEPENDENT_AMBULATORY_CARE_PROVIDER_SITE_OTHER): Payer: Self-pay | Admitting: Physician Assistant

## 2021-01-11 ENCOUNTER — Other Ambulatory Visit (INDEPENDENT_AMBULATORY_CARE_PROVIDER_SITE_OTHER): Payer: Self-pay | Admitting: Physician Assistant

## 2021-01-11 VITALS — BP 135/81 | HR 72 | Temp 98.2°F | Ht 71.0 in | Wt 290.0 lb

## 2021-01-11 DIAGNOSIS — Z6841 Body Mass Index (BMI) 40.0 and over, adult: Secondary | ICD-10-CM

## 2021-01-11 DIAGNOSIS — E559 Vitamin D deficiency, unspecified: Secondary | ICD-10-CM

## 2021-01-11 DIAGNOSIS — E1169 Type 2 diabetes mellitus with other specified complication: Secondary | ICD-10-CM

## 2021-01-11 DIAGNOSIS — E785 Hyperlipidemia, unspecified: Secondary | ICD-10-CM | POA: Diagnosis not present

## 2021-01-11 MED ORDER — VITAMIN D (ERGOCALCIFEROL) 1.25 MG (50000 UNIT) PO CAPS: 50000.0000 [IU] | ORAL_CAPSULE | ORAL | 0 refills | Status: DC

## 2021-01-13 NOTE — Progress Notes (Signed)
Chief Complaint:   OBESITY Dravyn is here to discuss his progress with his obesity treatment plan along with follow-up of his obesity related diagnoses. Navarre is on the Category 3 Plan and states he is following his eating plan approximately 75% of the time. Kodi states he is walking for 60 minutes 5 times per week.  Today's visit was #: 79 Starting weight: 309 lbs Starting date: 12/25/2018 Today's weight: 290 lbs Today's date: 01/11/2021 Total lbs lost to date: 19 Total lbs lost since last in-office visit: 1  Interim History: Fenix continues to struggle with eating enough food. He is very aware of the choices he is making. He is skipping lunch most days.  Subjective:   1. Hyperlipidemia associated with type 2 diabetes mellitus ([place]) Ashish's last lipid panel done by his primary care physician in May 2022 was at goal. He would like to see his HDL higher. He is on atorvastatin.  2. Vitamin D deficiency Kyden is on Vit D, and he is tolerating it well.  Assessment/Plan:   1. Hyperlipidemia associated with type 2 diabetes mellitus ([place]) Cardiovascular risk and specific lipid/LDL goals reviewed.  We discussed several lifestyle modifications today. [place] will continue his medications and increase exercise. Orders and follow up as documented in patient record.   Counseling Intensive lifestyle modifications are the first line treatment for this issue. Dietary changes: Increase soluble fiber. Decrease simple carbohydrates. Exercise changes: Moderate to vigorous-intensity aerobic activity 150 minutes per week if tolerated. Lipid-lowering medications: see documented in medical record.  2. Vitamin D deficiency Low Vitamin D level contributes to fatigue and are associated with obesity, breast, and colon cancer. We will refill prescription Vitamin D for 1 month. Skeeter will follow-up for routine testing of Vitamin D, at least 2-3 times per year to avoid over-replacement.  - Vitamin  D, Ergocalciferol, (DRISDOL) 1.25 MG (50000 UNIT) CAPS capsule; Take 1 capsule (50,000 Units total) by mouth every 7 (seven) days.  Dispense: 4 capsule; Refill: 0  3. Class 3 severe obesity with serious comorbidity and body mass index (BMI) of 40.0 to 44.9 in adult, unspecified obesity type [facility]) Jalil is currently in the action stage of change. As such, his goal is to continue with weight loss efforts. He has agreed to the Category 3 Plan.   Exercise goals: As is.  Behavioral modification strategies: increasing lean protein intake and no skipping meals.  Hanif has agreed to follow-up with our clinic in 3 weeks. He was informed of the importance of frequent follow-up visits to maximize his success with intensive lifestyle modifications for his multiple health conditions.   Objective:   Blood pressure 135/81, pulse 72, temperature 98.2 F (36.8 C), height 5\' 11"  (1.803 m), weight 290 lb (131.5 kg), SpO2 100 %. Body mass index is 40.45 kg/m.  General: Cooperative, alert, well developed, in no acute distress. HEENT: Conjunctivae and lids unremarkable. Cardiovascular: Regular rhythm.  Lungs: Normal work of breathing. Neurologic: No focal deficits.   Lab Results  Component Value Date   CREATININE 0.88 09/30/2020   BUN 16 09/30/2020   NA 140 09/30/2020   K 4.3 09/30/2020   CL 105 09/30/2020   CO2 20 09/30/2020   Lab Results  Component Value Date   ALT 17 09/30/2020   AST 15 09/30/2020   ALKPHOS 39 (L) 09/30/2020   BILITOT 0.6 09/30/2020   Lab Results  Component Value Date   HGBA1C 5.7 (H) 09/30/2020   HGBA1C 5.9 (H) 05/31/2020   HGBA1C  5.8 (H) 02/04/2020   HGBA1C 5.6 09/16/2019   HGBA1C 5.8 06/05/2019   Lab Results  Component Value Date   INSULIN 7.6 09/30/2020   INSULIN 11.1 05/31/2020   INSULIN 6.7 02/04/2020   INSULIN 9.5 12/25/2018   Lab Results  Component Value Date   TSH 0.920 12/25/2018   Lab Results  Component Value Date   CHOL 120 12/06/2020   HDL  40.70 12/06/2020   LDLCALC 69 12/06/2020   TRIG 49.0 12/06/2020   CHOLHDL 3 12/06/2020   Lab Results  Component Value Date   VD25OH 58.8 09/30/2020   VD25OH 88.3 05/31/2020   VD25OH 40.1 02/04/2020   Lab Results  Component Value Date   WBC 6.5 12/25/2018   HGB 14.5 12/25/2018   HCT 45.1 12/25/2018   MCV 84 12/25/2018   PLT 221.0 06/28/2017   No results found for: IRON, TIBC, FERRITIN  Obesity Behavioral Intervention:   Approximately 15 minutes were spent on the discussion below.  ASK: We discussed the diagnosis of obesity with [name] today and Ricci agreed to give [name] permission to discuss obesity behavioral modification therapy today.  ASSESS: Isador has the diagnosis of obesity and his BMI today is 40.46. Phyllip is in the action stage of change.   ADVISE: Girolamo was educated on the multiple health risks of obesity as well as the benefit of weight loss to improve his health. He was advised of the need for long term treatment and the importance of lifestyle modifications to improve his current health and to decrease his risk of future health problems.  AGREE: Multiple dietary modification options and treatment options were discussed and Arik agreed to follow the recommendations documented in the above note.  ARRANGE: Sly was educated on the importance of frequent visits to treat obesity as outlined per CMS and USPSTF guidelines and agreed to schedule his next follow up appointment today.  Attestation Statements:   Reviewed by clinician on day of visit: allergies, medications, problem list, medical history, surgical history, family history, social history, and previous encounter notes.   [name], am acting as transcriptionist for [employer], PA-C.  I have reviewed the above documentation for accuracy and completeness, and I agree with the above. Abby Potash, PA-C

## 2021-01-21 DIAGNOSIS — H5203 Hypermetropia, bilateral: Secondary | ICD-10-CM | POA: Diagnosis not present

## 2021-01-21 DIAGNOSIS — Z0100 Encounter for examination of eyes and vision without abnormal findings: Secondary | ICD-10-CM | POA: Diagnosis not present

## 2021-01-31 NOTE — Telephone Encounter (Signed)
Erroneous Encounter

## 2021-02-01 ENCOUNTER — Encounter (INDEPENDENT_AMBULATORY_CARE_PROVIDER_SITE_OTHER): Payer: Self-pay | Admitting: Physician Assistant

## 2021-02-01 ENCOUNTER — Other Ambulatory Visit: Payer: Self-pay

## 2021-02-01 ENCOUNTER — Other Ambulatory Visit (INDEPENDENT_AMBULATORY_CARE_PROVIDER_SITE_OTHER): Payer: Self-pay | Admitting: Physician Assistant

## 2021-02-01 ENCOUNTER — Ambulatory Visit (INDEPENDENT_AMBULATORY_CARE_PROVIDER_SITE_OTHER): Payer: Medicare HMO | Admitting: Physician Assistant

## 2021-02-01 VITALS — BP 128/77 | HR 70 | Temp 98.5°F | Ht 71.0 in | Wt 285.0 lb

## 2021-02-01 DIAGNOSIS — E1165 Type 2 diabetes mellitus with hyperglycemia: Secondary | ICD-10-CM | POA: Diagnosis not present

## 2021-02-01 DIAGNOSIS — E559 Vitamin D deficiency, unspecified: Secondary | ICD-10-CM | POA: Diagnosis not present

## 2021-02-01 DIAGNOSIS — Z6841 Body Mass Index (BMI) 40.0 and over, adult: Secondary | ICD-10-CM

## 2021-02-01 MED ORDER — VITAMIN D (ERGOCALCIFEROL) 1.25 MG (50000 UNIT) PO CAPS: 50000.0000 [IU] | ORAL_CAPSULE | ORAL | 0 refills | Status: DC

## 2021-02-08 NOTE — Progress Notes (Signed)
Chief Complaint:   OBESITY Devlen is here to discuss his progress with his obesity treatment plan along with follow-up of his obesity related diagnoses. Brook is on the Category 3 Plan and states he is following his eating plan approximately 75% of the time. Priyan states he is doing 0 minutes 0 times per week.  Today's visit was #: 30 Starting weight: 309 lbs Starting date: 12/25/2018 Today's weight: 285 lbs Today's date: 02/01/2021 Total lbs lost to date: 24 Total lbs lost since last in-office visit: 5  Interim History: Deaire has been eating lunch more often, and skipping less meals in general. He is feeling better throughout the day.  Subjective:   1. Vitamin D deficiency Adon is on Vit D weekly, and he denies nausea, vomiting, or muscle weakness.  2. Type 2 diabetes mellitus with hyperglycemia, without long-term current use of insulin (HCC) Havoc's last A1c was 5.7, very well controlled. He denies hypoglycemia and he is not on medications.  Assessment/Plan:   1. Vitamin D deficiency Low Vitamin D level contributes to fatigue and are associated with obesity, breast, and colon cancer. We will refill prescription Vitamin D for 1 month. Tyeson will follow-up for routine testing of Vitamin D, at least 2-3 times per year to avoid over-replacement.  - Vitamin D, Ergocalciferol, (DRISDOL) 1.25 MG (50000 UNIT) CAPS capsule; Take 1 capsule (50,000 Units total) by mouth every 7 (seven) days.  Dispense: 4 capsule; Refill: 0  2. Type 2 diabetes mellitus with hyperglycemia, without long-term current use of insulin (HCC) Maciel will continue his meal plan. Good blood sugar control is important to decrease the likelihood of diabetic complications such as nephropathy, neuropathy, limb loss, blindness, coronary artery disease, and death. Intensive lifestyle modification including diet, exercise and weight loss are the first line of treatment for diabetes.   3. Class 3 severe obesity  with serious comorbidity and body mass index (BMI) of 40.0 to 44.9 in adult, unspecified obesity type ([place]), current bmi 39.77 Terrence is currently in the action stage of change. As such, his goal is to continue with weight loss efforts. He has agreed to the Category 3 Plan.   Exercise goals: No exercise has been prescribed at this time.  Behavioral modification strategies: meal planning and cooking strategies and keeping healthy foods in the home.  Deyvon has agreed to follow-up with our clinic in 3 weeks. He was informed of the importance of frequent follow-up visits to maximize his success with intensive lifestyle modifications for his multiple health conditions.   Objective:   Blood pressure 128/77, pulse 70, temperature 98.5 F (36.9 C), height '5\' 11"'$  (1.803 m), weight 285 lb (129.3 kg), SpO2 100 %. Body mass index is 39.75 kg/m.  General: Cooperative, alert, well developed, in no acute distress. HEENT: Conjunctivae and lids unremarkable. Cardiovascular: Regular rhythm.  Lungs: Normal work of breathing. Neurologic: No focal deficits.   Lab Results  Component Value Date   CREATININE 0.88 09/30/2020   BUN 16 09/30/2020   NA 140 09/30/2020   K 4.3 09/30/2020   CL 105 09/30/2020   CO2 20 09/30/2020   Lab Results  Component Value Date   ALT 17 09/30/2020   AST 15 09/30/2020   ALKPHOS 39 (L) 09/30/2020   BILITOT 0.6 09/30/2020   Lab Results  Component Value Date   HGBA1C 5.7 (H) 09/30/2020   HGBA1C 5.9 (H) 05/31/2020   HGBA1C 5.8 (H) 02/04/2020   HGBA1C 5.6 09/16/2019   HGBA1C 5.8 06/05/2019  Lab Results  Component Value Date   INSULIN 7.6 09/30/2020   INSULIN 11.1 05/31/2020   INSULIN 6.7 02/04/2020   INSULIN 9.5 12/25/2018   Lab Results  Component Value Date   TSH 0.920 12/25/2018   Lab Results  Component Value Date   CHOL 120 12/06/2020   HDL 40.70 12/06/2020   LDLCALC 69 12/06/2020   TRIG 49.0 12/06/2020   CHOLHDL 3 12/06/2020   Lab Results   Component Value Date   VD25OH 58.8 09/30/2020   VD25OH 88.3 05/31/2020   VD25OH 40.1 02/04/2020   Lab Results  Component Value Date   WBC 6.5 12/25/2018   HGB 14.5 12/25/2018   HCT 45.1 12/25/2018   MCV 84 12/25/2018   PLT 221.0 06/28/2017   No results found for: IRON, TIBC, FERRITIN  Obesity Behavioral Intervention:   Approximately 15 minutes were spent on the discussion below.  ASK: We discussed the diagnosis of obesity with [name] today and Jaquaveon agreed to give [name] permission to discuss obesity behavioral modification therapy today.  ASSESS: Ygnacio has the diagnosis of obesity and his BMI today is 39.77. Swayze is in the action stage of change.   ADVISE: Zayvier was educated on the multiple health risks of obesity as well as the benefit of weight loss to improve his health. He was advised of the need for long term treatment and the importance of lifestyle modifications to improve his current health and to decrease his risk of future health problems.  AGREE: Multiple dietary modification options and treatment options were discussed and Krzysztof agreed to follow the recommendations documented in the above note.  ARRANGE: Dadrien was educated on the importance of frequent visits to treat obesity as outlined per CMS and USPSTF guidelines and agreed to schedule his next follow up appointment today.  Attestation Statements:   Reviewed by clinician on day of visit: allergies, medications, problem list, medical history, surgical history, family history, social history, and previous encounter notes.   [name], am acting as transcriptionist for [employer], PA-C.  I have reviewed the above documentation for accuracy and completeness, and I agree with the above. Abby Potash, PA-C

## 2021-02-22 ENCOUNTER — Other Ambulatory Visit: Payer: Self-pay | Admitting: Family Medicine

## 2021-02-22 DIAGNOSIS — I10 Essential (primary) hypertension: Secondary | ICD-10-CM

## 2021-02-23 ENCOUNTER — Ambulatory Visit (INDEPENDENT_AMBULATORY_CARE_PROVIDER_SITE_OTHER): Payer: Medicare HMO | Admitting: Physician Assistant

## 2021-02-26 ENCOUNTER — Other Ambulatory Visit (INDEPENDENT_AMBULATORY_CARE_PROVIDER_SITE_OTHER): Payer: Self-pay | Admitting: Physician Assistant

## 2021-02-26 DIAGNOSIS — E559 Vitamin D deficiency, unspecified: Secondary | ICD-10-CM

## 2021-02-28 ENCOUNTER — Other Ambulatory Visit (INDEPENDENT_AMBULATORY_CARE_PROVIDER_SITE_OTHER): Payer: Self-pay | Admitting: Family Medicine

## 2021-02-28 DIAGNOSIS — E559 Vitamin D deficiency, unspecified: Secondary | ICD-10-CM

## 2021-02-28 NOTE — Telephone Encounter (Signed)
Pt last seen by Tracey Aguilar, PA-C.  

## 2021-02-28 NOTE — Telephone Encounter (Signed)
LAST APPOINTMENT DATE: 02/01/2021 NEXT APPOINTMENT DATE: 03/23/2021 Last appt rescheduled due to provider out of office  [place] [id number] - [place], [address] AT [facility] OF [address] [address] Phone: [phone] Fax: [phone]  Patient is requesting a refill of the following medications: Requested Prescriptions   Pending Prescriptions Disp Refills   Vitamin D, Ergocalciferol, (DRISDOL) 1.25 MG (50000 UNIT) CAPS capsule [Pharmacy Med Name: VITAMIN D2 50,000IU (ERGO) CAP RX] 4 capsule 0    Sig: TAKE 1 CAPSULE BY MOUTH EVERY 7 DAYS    Date last filled: 02/01/2021 Previously prescribed by Linus Orn  Lab Results  Component Value Date   HGBA1C 5.7 (H) 09/30/2020   HGBA1C 5.9 (H) 05/31/2020   HGBA1C 5.8 (H) 02/04/2020   Lab Results  Component Value Date   MICROALBUR 0.7 12/06/2020   LDLCALC 69 12/06/2020   CREATININE 0.88 09/30/2020   Lab Results  Component Value Date   VD25OH 58.8 09/30/2020   VD25OH 88.3 05/31/2020   VD25OH 40.1 02/04/2020    BP Readings from Last 3 Encounters:  02/01/21 128/77  01/11/21 135/81  12/20/20 134/80

## 2021-03-02 ENCOUNTER — Encounter (INDEPENDENT_AMBULATORY_CARE_PROVIDER_SITE_OTHER): Payer: Self-pay

## 2021-03-12 ENCOUNTER — Other Ambulatory Visit: Payer: Self-pay | Admitting: Family Medicine

## 2021-03-12 DIAGNOSIS — I10 Essential (primary) hypertension: Secondary | ICD-10-CM

## 2021-03-23 ENCOUNTER — Ambulatory Visit (INDEPENDENT_AMBULATORY_CARE_PROVIDER_SITE_OTHER): Payer: Medicare HMO | Admitting: Physician Assistant

## 2021-03-27 ENCOUNTER — Other Ambulatory Visit (INDEPENDENT_AMBULATORY_CARE_PROVIDER_SITE_OTHER): Payer: Self-pay | Admitting: Family Medicine

## 2021-03-27 DIAGNOSIS — E559 Vitamin D deficiency, unspecified: Secondary | ICD-10-CM

## 2021-03-28 ENCOUNTER — Ambulatory Visit (INDEPENDENT_AMBULATORY_CARE_PROVIDER_SITE_OTHER): Payer: Medicare HMO | Admitting: Family Medicine

## 2021-03-28 ENCOUNTER — Encounter (INDEPENDENT_AMBULATORY_CARE_PROVIDER_SITE_OTHER): Payer: Self-pay | Admitting: Family Medicine

## 2021-03-28 ENCOUNTER — Other Ambulatory Visit: Payer: Self-pay

## 2021-03-28 ENCOUNTER — Other Ambulatory Visit (INDEPENDENT_AMBULATORY_CARE_PROVIDER_SITE_OTHER): Payer: Self-pay | Admitting: Family Medicine

## 2021-03-28 ENCOUNTER — Encounter (INDEPENDENT_AMBULATORY_CARE_PROVIDER_SITE_OTHER): Payer: Self-pay | Admitting: Emergency Medicine

## 2021-03-28 VITALS — BP 122/75 | HR 68 | Temp 98.3°F | Ht 71.0 in | Wt 288.0 lb

## 2021-03-28 DIAGNOSIS — E1169 Type 2 diabetes mellitus with other specified complication: Secondary | ICD-10-CM

## 2021-03-28 DIAGNOSIS — E559 Vitamin D deficiency, unspecified: Secondary | ICD-10-CM

## 2021-03-28 DIAGNOSIS — Z6841 Body Mass Index (BMI) 40.0 and over, adult: Secondary | ICD-10-CM

## 2021-03-28 MED ORDER — VITAMIN D (ERGOCALCIFEROL) 1.25 MG (50000 UNIT) PO CAPS: 50000.0000 [IU] | ORAL_CAPSULE | ORAL | 0 refills | Status: DC

## 2021-03-28 NOTE — Progress Notes (Signed)
Chief Complaint:   OBESITY Daniel Powers is here to discuss his progress with his obesity treatment plan along with follow-up of his obesity related diagnoses. Daniel Powers is on the Category 3 Plan and states he is following his eating plan approximately 50% of the time. Daniel Powers states he is doing 0 minutes 0 times per week.  Today's visit was #: 38 Starting weight: 309 lbs Starting date: 12/25/2018 Today's weight: 288 lbs Today's date: 03/28/2021 Total lbs lost to date: 21 lbs Total lbs lost since last in-office visit: 0  Interim History: Daniel Powers reports not exercising over the past several weeks. He finds it hard to get up early to exercise before work. He still tends to skip lunch sometimes but he does drink a protein shake when he skips lunch. His wife is out of town caring for her father which leaves him on his own for dinner.   Subjective:   1. Type 2 diabetes mellitus with other specified complication, without long-term current use of insulin (HCC) Daniel Powers's diabetes is well controlled with diet and exercise only. His last A1C was 5.7. He denies polyphagia.  Lab Results  Component Value Date   HGBA1C 5.7 (H) 09/30/2020   HGBA1C 5.9 (H) 05/31/2020   HGBA1C 5.8 (H) 02/04/2020   Lab Results  Component Value Date   MICROALBUR 0.7 12/06/2020   LDLCALC 69 12/06/2020   CREATININE 0.88 09/30/2020   Lab Results  Component Value Date   INSULIN 7.6 09/30/2020   INSULIN 11.1 05/31/2020   INSULIN 6.7 02/04/2020   INSULIN 9.5 12/25/2018    2. Vitamin D deficiency Daniel Powers's Vitamin D is at goal 58.8. He is on weekly prescription Vitamin D.  Lab Results  Component Value Date   VD25OH 58.8 09/30/2020   VD25OH 88.3 05/31/2020   VD25OH 40.1 02/04/2020    Assessment/Plan:   1. Type 2 diabetes mellitus with other specified complication, without long-term current use of insulin (HCC) [name] will continue exercise and meal plan.   2. Vitamin D deficiency  We will  refill prescription  Vitamin D 50,000 IU weekly and he will follow-up for routine testing of Vitamin D, at least 2-3 times per year to avoid over-replacement. We will check Vitamin D next office visit.   - Vitamin D, Ergocalciferol, (DRISDOL) 1.25 MG (50000 UNIT) CAPS capsule; Take 1 capsule (50,000 Units total) by mouth every 7 (seven) days.  Dispense: 4 capsule; Refill: 0  3. Obesity: Current BMI 40.19 Daniel Powers is currently in the action stage of change. As such, his goal is to continue with weight loss efforts. He has agreed to the Category 3 Plan and keeping a food journal and adhering to recommended goals of 450-600 calories and 40 grams of protein daily.   Handouts -eating out.  Daniel Powers will try to exercise at least 2 days per week - Sat and Sun.  Exercise goals:  Daniel Powers will increase exercise frequency.  Behavioral modification strategies: meal planning and cooking strategies.  Daniel Powers has agreed to follow-up with our clinic in 3 weeks with Daniel Powers, PAC.  Objective:   Blood pressure 122/75, pulse 68, temperature 98.3 F (36.8 C), height '5\' 11"'$  (1.803 m), weight 288 lb (130.6 kg), SpO2 98 %. Body mass index is 40.17 kg/m.  General: Cooperative, alert, well developed, in no acute distress. HEENT: Conjunctivae and lids unremarkable. Cardiovascular: Regular rhythm.  Lungs: Normal work of breathing. Neurologic: No focal deficits.   Lab Results  Component Value Date   CREATININE 0.88 09/30/2020  BUN 16 09/30/2020   NA 140 09/30/2020   K 4.3 09/30/2020   CL 105 09/30/2020   CO2 20 09/30/2020   Lab Results  Component Value Date   ALT 17 09/30/2020   AST 15 09/30/2020   ALKPHOS 39 (L) 09/30/2020   BILITOT 0.6 09/30/2020   Lab Results  Component Value Date   HGBA1C 5.7 (H) 09/30/2020   HGBA1C 5.9 (H) 05/31/2020   HGBA1C 5.8 (H) 02/04/2020   HGBA1C 5.6 09/16/2019   HGBA1C 5.8 06/05/2019   Lab Results  Component Value Date   INSULIN 7.6 09/30/2020   INSULIN 11.1 05/31/2020   INSULIN  6.7 02/04/2020   INSULIN 9.5 12/25/2018   Lab Results  Component Value Date   TSH 0.920 12/25/2018   Lab Results  Component Value Date   CHOL 120 12/06/2020   HDL 40.70 12/06/2020   LDLCALC 69 12/06/2020   TRIG 49.0 12/06/2020   CHOLHDL 3 12/06/2020   Lab Results  Component Value Date   VD25OH 58.8 09/30/2020   VD25OH 88.3 05/31/2020   VD25OH 40.1 02/04/2020   Lab Results  Component Value Date   WBC 6.5 12/25/2018   HGB 14.5 12/25/2018   HCT 45.1 12/25/2018   MCV 84 12/25/2018   PLT 221.0 06/28/2017   No results found for: IRON, TIBC, FERRITIN  Obesity Behavioral Intervention:   Approximately 15 minutes were spent on the discussion below.  ASK: We discussed the diagnosis of obesity with Daniel Powers today and Daniel Powers agreed to give [name] permission to discuss obesity behavioral modification therapy today.  ASSESS: Daniel Powers has the diagnosis of obesity and his BMI today is 40.3. Daniel Powers is in the action stage of change.   ADVISE: Daniel Powers was Powers on the multiple health risks of obesity as well as the benefit of weight loss to improve his health. He was advised of the need for long term treatment and the importance of lifestyle modifications to improve his current health and to decrease his risk of future health problems.  AGREE: Multiple dietary modification options and treatment options were discussed and Daniel Powers agreed to follow the recommendations documented in the above note.  ARRANGE: Daniel Powers on the importance of frequent visits to treat obesity as outlined per CMS and USPSTF guidelines and agreed to schedule his next follow up appointment today.  Attestation Statements:   Reviewed by clinician on day of visit: allergies, medications, problem list, medical history, surgical history, family history, social history, and previous encounter notes.  I, Daniel Powers, RMA, am acting as Location manager for [employer], Daniel Powers.   I have reviewed the above  documentation for accuracy and completeness, and I agree with the above. -  Georgianne Fick, FNP

## 2021-03-28 NOTE — Telephone Encounter (Signed)
Last seen by Linus Orn 02/01/21

## 2021-03-28 NOTE — Telephone Encounter (Signed)
Pt last seen by Dawn Whitmire, FNP.  

## 2021-03-28 NOTE — Telephone Encounter (Signed)
Msg sent Via Mychart

## 2021-03-30 ENCOUNTER — Other Ambulatory Visit: Payer: Self-pay | Admitting: Family Medicine

## 2021-03-30 DIAGNOSIS — E785 Hyperlipidemia, unspecified: Secondary | ICD-10-CM

## 2021-04-18 ENCOUNTER — Other Ambulatory Visit (INDEPENDENT_AMBULATORY_CARE_PROVIDER_SITE_OTHER): Payer: Self-pay | Admitting: Family Medicine

## 2021-04-18 ENCOUNTER — Other Ambulatory Visit: Payer: Self-pay

## 2021-04-18 ENCOUNTER — Ambulatory Visit (INDEPENDENT_AMBULATORY_CARE_PROVIDER_SITE_OTHER): Payer: Medicare HMO | Admitting: Family Medicine

## 2021-04-18 ENCOUNTER — Encounter (INDEPENDENT_AMBULATORY_CARE_PROVIDER_SITE_OTHER): Payer: Self-pay | Admitting: Family Medicine

## 2021-04-18 VITALS — BP 131/83 | HR 80 | Temp 98.3°F | Ht 71.0 in | Wt 285.0 lb

## 2021-04-18 DIAGNOSIS — E559 Vitamin D deficiency, unspecified: Secondary | ICD-10-CM

## 2021-04-18 DIAGNOSIS — Z6841 Body Mass Index (BMI) 40.0 and over, adult: Secondary | ICD-10-CM | POA: Diagnosis not present

## 2021-04-18 DIAGNOSIS — E1169 Type 2 diabetes mellitus with other specified complication: Secondary | ICD-10-CM | POA: Diagnosis not present

## 2021-04-18 MED ORDER — VITAMIN D (ERGOCALCIFEROL) 1.25 MG (50000 UNIT) PO CAPS: 50000.0000 [IU] | ORAL_CAPSULE | ORAL | 0 refills | Status: DC

## 2021-04-18 NOTE — Progress Notes (Addendum)
Chief Complaint:   OBESITY Daniel Powers is here to discuss his progress with his obesity treatment plan along with follow-up of his obesity related diagnoses. [name] is on the Category 3 Plan and keeping a food journal and adhering to recommended goals of 450-600 calories and 40 grams of protein and states he is following his eating plan approximately 75% of the time. Gradie states he is doing 0 minutes 0 times per week.  Today's visit was #: 34 Starting weight: 309 lbs Starting date: 12/25/2018 Today's weight: 285 lbs Today's date: 04/18/2021 Total lbs lost to date: 24 lbs Total lbs lost since last in-office visit: 3 lbs  Interim History: Wilhelm is still struggling with dinner. His wife is gone a lot taking care of her mom. He is eating out at lunch but tries to get a salad. He is trying to avoid sweets but did recently buy a pack of chocolate chip cookies.   Subjective:   1. Vitamin D deficiency Daniel Powers is at goal with his Vitamin D. He is on weekly prescription Vitamin D.  Lab Results  Component Value Date   VD25OH 58.8 09/30/2020   VD25OH 88.3 05/31/2020   VD25OH 40.1 02/04/2020    2. Type 2 diabetes mellitus with other specified complication, without long-term current use of insulin (HCC) Arben's diabetes mellitus is well controlled. He has never been on medication for diabetes mellitus.  Lab Results  Component Value Date   HGBA1C 5.7 (H) 09/30/2020   HGBA1C 5.9 (H) 05/31/2020   HGBA1C 5.8 (H) 02/04/2020   Lab Results  Component Value Date   MICROALBUR 0.7 12/06/2020   LDLCALC 69 12/06/2020   CREATININE 0.88 09/30/2020   Lab Results  Component Value Date   INSULIN 7.6 09/30/2020   INSULIN 11.1 05/31/2020   INSULIN 6.7 02/04/2020   INSULIN 9.5 12/25/2018    Assessment/Plan:   1. Vitamin D deficiency We will refill prescription Vitamin D 50,000 IU every week and Daniel Powers will follow-up for routine testing of Vitamin D, at least 2-3 times per year to avoid  over-replacement. We will check Vitamin D today.   - VITAMIN D 25 Hydroxy (Vit-D Deficiency, Fractures) - Vitamin D, Ergocalciferol, (DRISDOL) 1.25 MG (50000 UNIT) CAPS capsule; Take 1 capsule (50,000 Units total) by mouth every 7 (seven) days.  Dispense: 4 capsule; Refill: 0  2. Type 2 diabetes mellitus with other specified complication, without long-term current use of insulin (HCC) We will check labs today.  - Comprehensive metabolic panel - Hemoglobin A1c  3. Obesity: Current BMI 39.77 Daniel Powers is currently in the action stage of change. As such, his goal is to continue with weight loss efforts. He has agreed to the Category 3 Plan and keeping a food journal and adhering to recommended goals of 450-600 calories and 40 grams of protein.   Daniel Powers was encouraged to exercise on the weekends.  Exercise goals: Older adults should follow the adult guidelines. When older adults cannot meet the adult guidelines, they should be as physically active as their abilities and conditions will allow.   Behavioral modification strategies: decreasing simple carbohydrates and better snacking choices.  Daniel Powers has agreed to follow-up with our clinic in 3 weeks.  Objective:   Blood pressure 131/83, pulse 80, temperature 98.3 F (36.8 C), height 5\' 11"  (1.803 m), weight 285 lb (129.3 kg), SpO2 99 %. Body mass index is 39.75 kg/m.  General: Cooperative, alert, well developed, in no acute distress. HEENT: Conjunctivae and lids unremarkable. Cardiovascular: Regular rhythm.  Lungs: Normal work of breathing. Neurologic: No focal deficits.   Lab Results  Component Value Date   CREATININE 0.88 09/30/2020   BUN 16 09/30/2020   NA 140 09/30/2020   K 4.3 09/30/2020   CL 105 09/30/2020   CO2 20 09/30/2020   Lab Results  Component Value Date   ALT 17 09/30/2020   AST 15 09/30/2020   ALKPHOS 39 (L) 09/30/2020   BILITOT 0.6 09/30/2020   Lab Results  Component Value Date   HGBA1C 5.7 (H) 09/30/2020    HGBA1C 5.9 (H) 05/31/2020   HGBA1C 5.8 (H) 02/04/2020   HGBA1C 5.6 09/16/2019   HGBA1C 5.8 06/05/2019   Lab Results  Component Value Date   INSULIN 7.6 09/30/2020   INSULIN 11.1 05/31/2020   INSULIN 6.7 02/04/2020   INSULIN 9.5 12/25/2018   Lab Results  Component Value Date   TSH 0.920 12/25/2018   Lab Results  Component Value Date   CHOL 120 12/06/2020   HDL 40.70 12/06/2020   LDLCALC 69 12/06/2020   TRIG 49.0 12/06/2020   CHOLHDL 3 12/06/2020   Lab Results  Component Value Date   VD25OH 58.8 09/30/2020   VD25OH 88.3 05/31/2020   VD25OH 40.1 02/04/2020   Lab Results  Component Value Date   WBC 6.5 12/25/2018   HGB 14.5 12/25/2018   HCT 45.1 12/25/2018   MCV 84 12/25/2018   PLT 221.0 06/28/2017   No results found for: IRON, TIBC, FERRITIN  Attestation Statements:   Reviewed by clinician on day of visit: allergies, medications, problem list, medical history, surgical history, family history, social history, and previous encounter notes.  I, Lizbeth Bark, RMA, am acting as Location manager for [employer], [place].   I have reviewed the above documentation for accuracy and completeness, and I agree with the above. -  Georgianne Fick, FNP

## 2021-04-18 NOTE — Progress Notes (Signed)
See note by Lizbeth Bark, CMA.

## 2021-04-19 LAB — COMPREHENSIVE METABOLIC PANEL
ALT: 12 IU/L (ref 0–44)
AST: 14 IU/L (ref 0–40)
Albumin/Globulin Ratio: 1.6 (ref 1.2–2.2)
Albumin: 4.2 g/dL (ref 3.8–4.8)
Alkaline Phosphatase: 44 IU/L (ref 44–121)
BUN/Creatinine Ratio: 18 (ref 10–24)
BUN: 17 mg/dL (ref 8–27)
Bilirubin Total: 0.5 mg/dL (ref 0.0–1.2)
CO2: 23 mmol/L (ref 20–29)
Calcium: 9.2 mg/dL (ref 8.6–10.2)
Chloride: 105 mmol/L (ref 96–106)
Creatinine, Ser: 0.95 mg/dL (ref 0.76–1.27)
Globulin, Total: 2.6 g/dL (ref 1.5–4.5)
Glucose: 97 mg/dL (ref 70–99)
Potassium: 4.2 mmol/L (ref 3.5–5.2)
Sodium: 140 mmol/L (ref 134–144)
Total Protein: 6.8 g/dL (ref 6.0–8.5)
eGFR: 88 mL/min/{1.73_m2} (ref >59)

## 2021-04-19 LAB — VITAMIN D 25 HYDROXY (VIT D DEFICIENCY, FRACTURES): Vit D, 25-Hydroxy: 72.7 ng/mL (ref 30.0–100.0)

## 2021-04-19 LAB — HEMOGLOBIN A1C
Est. average glucose Bld gHb Est-mCnc: 120 mg/dL
Hgb A1c MFr Bld: 5.8 % — ABNORMAL HIGH (ref 4.8–5.6)

## 2021-05-10 ENCOUNTER — Ambulatory Visit (INDEPENDENT_AMBULATORY_CARE_PROVIDER_SITE_OTHER): Payer: Medicare HMO | Admitting: Physician Assistant

## 2021-05-12 ENCOUNTER — Other Ambulatory Visit: Payer: Self-pay | Admitting: Urology

## 2021-05-16 DIAGNOSIS — N401 Enlarged prostate with lower urinary tract symptoms: Secondary | ICD-10-CM | POA: Diagnosis not present

## 2021-05-21 ENCOUNTER — Other Ambulatory Visit (INDEPENDENT_AMBULATORY_CARE_PROVIDER_SITE_OTHER): Payer: Self-pay | Admitting: Family Medicine

## 2021-05-21 DIAGNOSIS — E559 Vitamin D deficiency, unspecified: Secondary | ICD-10-CM

## 2021-05-23 DIAGNOSIS — R3915 Urgency of urination: Secondary | ICD-10-CM | POA: Diagnosis not present

## 2021-05-23 DIAGNOSIS — N401 Enlarged prostate with lower urinary tract symptoms: Secondary | ICD-10-CM | POA: Diagnosis not present

## 2021-05-23 DIAGNOSIS — R972 Elevated prostate specific antigen [PSA]: Secondary | ICD-10-CM | POA: Diagnosis not present

## 2021-05-23 DIAGNOSIS — N5201 Erectile dysfunction due to arterial insufficiency: Secondary | ICD-10-CM | POA: Diagnosis not present

## 2021-05-23 NOTE — Telephone Encounter (Signed)
Last OV with Dawn 

## 2021-05-24 ENCOUNTER — Other Ambulatory Visit: Payer: Self-pay | Admitting: Family Medicine

## 2021-05-24 ENCOUNTER — Ambulatory Visit (INDEPENDENT_AMBULATORY_CARE_PROVIDER_SITE_OTHER): Payer: Medicare HMO | Admitting: Physician Assistant

## 2021-05-24 ENCOUNTER — Encounter (INDEPENDENT_AMBULATORY_CARE_PROVIDER_SITE_OTHER): Payer: Self-pay | Admitting: Physician Assistant

## 2021-05-24 ENCOUNTER — Other Ambulatory Visit: Payer: Self-pay

## 2021-05-24 VITALS — BP 123/69 | HR 67 | Temp 98.0°F | Ht 71.0 in | Wt 286.0 lb

## 2021-05-24 DIAGNOSIS — E1169 Type 2 diabetes mellitus with other specified complication: Secondary | ICD-10-CM

## 2021-05-24 DIAGNOSIS — Z6841 Body Mass Index (BMI) 40.0 and over, adult: Secondary | ICD-10-CM

## 2021-05-24 DIAGNOSIS — I82401 Acute embolism and thrombosis of unspecified deep veins of right lower extremity: Secondary | ICD-10-CM

## 2021-05-24 DIAGNOSIS — E559 Vitamin D deficiency, unspecified: Secondary | ICD-10-CM

## 2021-05-24 MED ORDER — VITAMIN D3 50 MCG (2000 UT) PO CAPS: 4000.0000 [IU] | ORAL_CAPSULE | Freq: Every day | ORAL | 0 refills | Status: DC

## 2021-05-24 NOTE — Progress Notes (Signed)
Chief Complaint:   OBESITY Daniel Powers is here to discuss his progress with his obesity treatment plan along with follow-up of his obesity related diagnoses. Daniel Powers is on the Category 3 Plan and keeping a food journal and adhering to recommended goals of 450-600 calories and 40 grams of protein and states he is following his eating plan approximately 75% of the time. Demarea states he is doing 0 minutes 0 times per week.  Today's visit was #: 78 Starting weight: 309 lbs Starting date: 12/25/2018 Today's weight: 286 lbs Today's date: 05/24/2021 Total lbs lost to date: 23 Total lbs lost since last in-office visit: 0  Interim History: [name] states he is "aware" of what he is eating. He is craving sweets, and he continues to skip lunch at times.  Subjective:   1. Type 2 diabetes mellitus with other specified complication, without long-term current use of insulin (HCC) [name]'s last A1c was 5.8. He is not on medications and he denies hypoglycemia. I discussed labs with the patient today.  2. Vitamin D deficiency [name]'s last Vitamin D level was 72.7. He is on Vitamin D 50,000 units weekly. I discussed labs with the patient today.  Assessment/Plan:   1. Type 2 diabetes mellitus with other specified complication, without long-term current use of insulin (HCC) [name] will continue his meal plan. Good blood sugar control is important to decrease the likelihood of diabetic complications such as nephropathy, neuropathy, limb loss, blindness, coronary artery disease, and death. Intensive lifestyle modification including diet, exercise and weight loss are the first line of treatment for diabetes.   2. Vitamin D deficiency Low Vitamin D level contributes to fatigue and are associated with obesity, breast, and colon cancer. Buddy agreed to discontinue prescription Vitamin D 50,000 units, and he agreed to change to OTC Vitamin D 4,000 units daily. He will follow-up for routine testing of Vitamin D,  at least 2-3 times per year to avoid over-replacement.  - Cholecalciferol (VITAMIN D3) 50 MCG (2000 UT) capsule; Take 2 capsules (4,000 Units total) by mouth daily.  Dispense: 30 capsule; Refill: 0  3. Obesity: Current BMI 39.91 [name] is currently in the action stage of change. As such, his goal is to continue with weight loss efforts. He has agreed to the Category 3 Plan.   Exercise goals: No exercise has been prescribed at this time.  Behavioral modification strategies: increasing lean protein intake and no skipping meals.  [name] has agreed to follow-up with our clinic in 4 weeks. He was informed of the importance of frequent follow-up visits to maximize his success with intensive lifestyle modifications for his multiple health conditions.   Objective:   Blood pressure 123/69, pulse 67, temperature 98 F (36.7 C), height 5\' 11"  (1.803 m), weight 286 lb (129.7 kg), SpO2 100 %. Body mass index is 39.89 kg/m.  General: Cooperative, alert, well developed, in no acute distress. HEENT: Conjunctivae and lids unremarkable. Cardiovascular: Regular rhythm.  Lungs: Normal work of breathing. Neurologic: No focal deficits.   Lab Results  Component Value Date   CREATININE 0.95 04/18/2021   BUN 17 04/18/2021   NA 140 04/18/2021   K 4.2 04/18/2021   CL 105 04/18/2021   CO2 23 04/18/2021   Lab Results  Component Value Date   ALT 12 04/18/2021   AST 14 04/18/2021   ALKPHOS 44 04/18/2021   BILITOT 0.5 04/18/2021   Lab Results  Component Value Date   HGBA1C 5.8 (H) 04/18/2021   HGBA1C 5.7 (H) 09/30/2020  HGBA1C 5.9 (H) 05/31/2020   HGBA1C 5.8 (H) 02/04/2020   HGBA1C 5.6 09/16/2019   Lab Results  Component Value Date   INSULIN 7.6 09/30/2020   INSULIN 11.1 05/31/2020   INSULIN 6.7 02/04/2020   INSULIN 9.5 12/25/2018   Lab Results  Component Value Date   TSH 0.920 12/25/2018   Lab Results  Component Value Date   CHOL 120 12/06/2020   HDL 40.70 12/06/2020   LDLCALC 69  12/06/2020   TRIG 49.0 12/06/2020   CHOLHDL 3 12/06/2020   Lab Results  Component Value Date   VD25OH 72.7 04/18/2021   VD25OH 58.8 09/30/2020   VD25OH 88.3 05/31/2020   Lab Results  Component Value Date   WBC 6.5 12/25/2018   HGB 14.5 12/25/2018   HCT 45.1 12/25/2018   MCV 84 12/25/2018   PLT 221.0 06/28/2017   No results found for: IRON, TIBC, FERRITIN  Obesity Behavioral Intervention:   Approximately 15 minutes were spent on the discussion below.  ASK: We discussed the diagnosis of obesity with [name] today and [name] agreed to give [name] permission to discuss obesity behavioral modification therapy today.  ASSESS: [name] has the diagnosis of obesity and his BMI today is 39.9. [name] is in the action stage of change.   ADVISE: [name] was educated on the multiple health risks of obesity as well as the benefit of weight loss to improve his health. He was advised of the need for long term treatment and the importance of lifestyle modifications to improve his current health and to decrease his risk of future health problems.  AGREE: Multiple dietary modification options and treatment options were discussed and [name] agreed to follow the recommendations documented in the above note.  ARRANGE: [name] was educated on the importance of frequent visits to treat obesity as outlined per CMS and USPSTF guidelines and agreed to schedule his next follow up appointment today.  Attestation Statements:   Reviewed by clinician on day of visit: allergies, medications, problem list, medical history, surgical history, family history, social history, and previous encounter notes.   [name], am acting as transcriptionist for [employer], PA-C.  I have reviewed the above documentation for accuracy and completeness, and I agree with the above. Abby Potash, PA-C

## 2021-06-02 ENCOUNTER — Encounter (INDEPENDENT_AMBULATORY_CARE_PROVIDER_SITE_OTHER): Payer: Self-pay

## 2021-06-06 ENCOUNTER — Ambulatory Visit (INDEPENDENT_AMBULATORY_CARE_PROVIDER_SITE_OTHER): Payer: Medicare HMO

## 2021-06-06 ENCOUNTER — Other Ambulatory Visit: Payer: Self-pay

## 2021-06-06 VITALS — BP 142/78 | HR 70 | Temp 98.7°F | Resp 16 | Ht 71.0 in | Wt 296.6 lb

## 2021-06-06 DIAGNOSIS — Z0000 Encounter for general adult medical examination without abnormal findings: Secondary | ICD-10-CM | POA: Diagnosis not present

## 2021-06-06 DIAGNOSIS — Z23 Encounter for immunization: Secondary | ICD-10-CM | POA: Diagnosis not present

## 2021-06-06 NOTE — Patient Instructions (Signed)
Mr. Coppola , Thank you for taking time to come for your Medicare Wellness Visit. I appreciate your ongoing commitment to your health goals. Please review the following plan we discussed and let me know if I can assist you in the future.   Screening recommendations/referrals: Colonoscopy: Completed 05/01/2017-Due 05/01/2022 Recommended yearly ophthalmology/optometry visit for glaucoma screening and checkup Recommended yearly dental visit for hygiene and checkup  Vaccinations: Influenza vaccine: Completed at today's visit Pneumococcal vaccine: Up to date Tdap vaccine: Discuss with pharmacy Shingles vaccine: Completed vaccines   Covid-19: Booster available at the pharmacy  Advanced directives: Please bring a copy of Living Will and/or [place] for your chart.   Conditions/risks identified: See problem list  Next appointment: Follow up in one year for your annual wellness visit. 06/12/2022 @ 8:20  Preventive Care 65 Years and Older, Male Preventive care refers to lifestyle choices and visits with your health care provider that can promote health and wellness. What does preventive care include? A yearly physical exam. This is also called an annual well check. Dental exams once or twice a year. Routine eye exams. Ask your health care provider how often you should have your eyes checked. Personal lifestyle choices, including: Daily care of your teeth and gums. Regular physical activity. Eating a healthy diet. Avoiding tobacco and drug use. Limiting alcohol use. Practicing safe sex. Taking low doses of aspirin every day. Taking vitamin and mineral supplements as recommended by your health care provider. What happens during an annual well check? The services and screenings done by your health care provider during your annual well check will depend on your age, overall health, lifestyle risk factors, and family history of disease. Counseling  Your health care provider  may ask you questions about your: Alcohol use. Tobacco use. Drug use. Emotional well-being. Home and relationship well-being. Sexual activity. Eating habits. History of falls. Memory and ability to understand (cognition). Work and work Statistician. Screening  You may have the following tests or measurements: Height, weight, and BMI. Blood pressure. Lipid and cholesterol levels. These may be checked every 5 years, or more frequently if you are over 56 years old. Skin check. Lung cancer screening. You may have this screening every year starting at age 47 if you have a 30-pack-year history of smoking and currently smoke or have quit within the past 15 years. Fecal occult blood test (FOBT) of the stool. You may have this test every year starting at age 37. Flexible sigmoidoscopy or colonoscopy. You may have a sigmoidoscopy every 5 years or a colonoscopy every 10 years starting at age 45. Prostate cancer screening. Recommendations will vary depending on your family history and other risks. Hepatitis C blood test. Hepatitis B blood test. Sexually transmitted disease (STD) testing. Diabetes screening. This is done by checking your blood sugar (glucose) after you have not eaten for a while (fasting). You may have this done every 1-3 years. Abdominal aortic aneurysm (AAA) screening. You may need this if you are a current or former smoker. Osteoporosis. You may be screened starting at age 55 if you are at high risk. Talk with your health care provider about your test results, treatment options, and if necessary, the need for more tests. Vaccines  Your health care provider may recommend certain vaccines, such as: Influenza vaccine. This is recommended every year. Tetanus, diphtheria, and acellular pertussis (Tdap, Td) vaccine. You may need a Td booster every 10 years. Zoster vaccine. You may need this after age 51. Pneumococcal 13-valent  conjugate (PCV13) vaccine. One dose is recommended after  age 28. Pneumococcal polysaccharide (PPSV23) vaccine. One dose is recommended after age 38. Talk to your health care provider about which screenings and vaccines you need and how often you need them. This information is not intended to replace advice given to you by your health care provider. Make sure you discuss any questions you have with your health care provider. Document Released: 07/30/2015 Document Revised: 03/22/2016 Document Reviewed: 05/04/2015 Elsevier Interactive Patient Education  2017 [place] Prevention in the Home Falls can cause injuries. They can happen to people of all ages. There are many things you can do to make your home safe and to help prevent falls. What can I do on the outside of my home? Regularly fix the edges of walkways and driveways and fix any cracks. Remove anything that might make you trip as you walk through a door, such as a raised step or threshold. Trim any bushes or trees on the path to your home. Use bright outdoor lighting. Clear any walking paths of anything that might make someone trip, such as rocks or tools. Regularly check to see if handrails are loose or broken. Make sure that both sides of any steps have handrails. Any raised decks and porches should have guardrails on the edges. Have any leaves, snow, or ice cleared regularly. Use sand or salt on walking paths during winter. Clean up any spills in your garage right away. This includes oil or grease spills. What can I do in the bathroom? Use night lights. Install grab bars by the toilet and in the tub and shower. Do not use towel bars as grab bars. Use non-skid mats or decals in the tub or shower. If you need to sit down in the shower, use a plastic, non-slip stool. Keep the floor dry. Clean up any water that spills on the floor as soon as it happens. Remove soap buildup in the tub or shower regularly. Attach bath mats securely with double-sided non-slip rug tape. Do not have  throw rugs and other things on the floor that can make you trip. What can I do in the bedroom? Use night lights. Make sure that you have a light by your bed that is easy to reach. Do not use any sheets or blankets that are too big for your bed. They should not hang down onto the floor. Have a firm chair that has side arms. You can use this for support while you get dressed. Do not have throw rugs and other things on the floor that can make you trip. What can I do in the kitchen? Clean up any spills right away. Avoid walking on wet floors. Keep items that you use a lot in easy-to-reach places. If you need to reach something above you, use a strong step stool that has a grab bar. Keep electrical cords out of the way. Do not use floor polish or wax that makes floors slippery. If you must use wax, use non-skid floor wax. Do not have throw rugs and other things on the floor that can make you trip. What can I do with my stairs? Do not leave any items on the stairs. Make sure that there are handrails on both sides of the stairs and use them. Fix handrails that are broken or loose. Make sure that handrails are as long as the stairways. Check any carpeting to make sure that it is firmly attached to the stairs. Fix any carpet that  is loose or worn. Avoid having throw rugs at the top or bottom of the stairs. If you do have throw rugs, attach them to the floor with carpet tape. Make sure that you have a light switch at the top of the stairs and the bottom of the stairs. If you do not have them, ask someone to add them for you. What else can I do to help prevent falls? Wear shoes that: Do not have high heels. Have rubber bottoms. Are comfortable and fit you well. Are closed at the toe. Do not wear sandals. If you use a stepladder: Make sure that it is fully opened. Do not climb a closed stepladder. Make sure that both sides of the stepladder are locked into place. Ask someone to hold it for you, if  possible. Clearly mark and make sure that you can see: Any grab bars or handrails. First and last steps. Where the edge of each step is. Use tools that help you move around (mobility aids) if they are needed. These include: Canes. Walkers. Scooters. Crutches. Turn on the lights when you go into a dark area. Replace any light bulbs as soon as they burn out. Set up your furniture so you have a clear path. Avoid moving your furniture around. If any of your floors are uneven, fix them. If there are any pets around you, be aware of where they are. Review your medicines with your doctor. Some medicines can make you feel dizzy. This can increase your chance of falling. Ask your doctor what other things that you can do to help prevent falls. This information is not intended to replace advice given to you by your health care provider. Make sure you discuss any questions you have with your health care provider. Document Released: 04/29/2009 Document Revised: 12/09/2015 Document Reviewed: 08/07/2014 Elsevier Interactive Patient Education  2017 [employer].

## 2021-06-06 NOTE — Progress Notes (Signed)
Subjective:   Daniel Powers is a 65 y.o. male who presents for Medicare Annual/Initial preventive examination.   Review of Systems     Cardiac Risk Factors include: hypertension;advanced age (>39men, >67 women);male gender;diabetes mellitus;dyslipidemia;obesity (BMI >30kg/m2);sedentary lifestyle     Objective:    Today's Vitals   06/06/21 0801  BP: (!) 142/78  Pulse: 70  Resp: 16  Temp: 98.7 F (37.1 C)  TempSrc: Oral  SpO2: 98%  Weight: 296 lb 9.6 oz (134.5 kg)  Height: 5\' 11"  (1.803 m)   Body mass index is 41.37 kg/m.  Advanced Directives 06/06/2021  Does Patient Have a Medical Advance Directive? Yes  Type of Paramedic of [place];Living will  Copy of [place] in Chart? No - copy requested    Current Medications (verified) Outpatient Encounter Medications as of 06/06/2021  Medication Sig   albuterol (PROAIR HFA) 108 (90 Base) MCG/ACT inhaler Inhale 2 puffs into the lungs every 6 (six) hours as needed for wheezing.   amLODipine (NORVASC) 5 MG tablet TAKE 1 TABLET(5 MG) BY MOUTH DAILY   atorvastatin (LIPITOR) 10 MG tablet TAKE 1 TABLET(10 MG) BY MOUTH DAILY AT 6 PM   cetirizine (ZYRTEC) 10 MG tablet Take 10 mg by mouth daily.   CINNAMON PO Take 1 tablet by mouth daily.   ELIQUIS 5 MG TABS tablet TAKE 1 TABLET(5 MG) BY MOUTH TWICE DAILY   fish oil-omega-3 fatty acids 1000 MG capsule Take 1 g by mouth daily.   Flaxseed, Linseed, 1000 MG CAPS Take 1 capsule by mouth daily.   fluticasone (FLONASE) 50 MCG/ACT nasal spray Place 2 sprays into both nostrils daily.   olmesartan (BENICAR) 40 MG tablet TAKE 1 TABLET(40 MG) BY MOUTH DAILY   tadalafil (CIALIS) 20 MG tablet Take 20 mg by mouth daily as needed.   Cholecalciferol (VITAMIN D3) 50 MCG (2000 UT) capsule Take 2 capsules (4,000 Units total) by mouth daily. (Patient not taking: Reported on 06/06/2021)   COVID-19 mRNA Vac-TriS, Pfizer, (PFIZER-BIONT COVID-19 VAC-TRIS) SUSP  injection Inject into the muscle. (Patient not taking: Reported on 06/06/2021)   No facility-administered encounter medications on file as of 06/06/2021.    Allergies (verified) Penicillins   History: Past Medical History:  Diagnosis Date   Anxiety    Arthritis    Asthma    COPD (chronic obstructive pulmonary disease) (HCC)    DVT (deep venous thrombosis) (HCC)    Gout    High cholesterol    Hypertension    Influenza with pneumonia    Lactose intolerance    Obesity    Pre-diabetes    Psychosexual dysfunction with inhibited sexual excitement    Past Surgical History:  Procedure Laterality Date   HAND SURGERY     right   TONSILLECTOMY AND ADENOIDECTOMY     Family History  Problem Relation Age of Onset   Hypertension Mother    Alzheimer's disease Mother    Hypertension Father    Alcohol abuse Father    Obesity Father    Cancer Sister        "male organs"   Lung cancer Maternal Uncle    Esophageal cancer Maternal Uncle    Hypertension Maternal Grandmother    Alzheimer's disease Maternal Grandmother    Hypertension Maternal Grandfather    Hypertension Paternal Grandmother    Hypertension Paternal Grandfather    Heart disease Brother 85       MI   Alzheimer's disease Paternal Aunt  Colon cancer Neg Hx    Social History   Socioeconomic History   Marital status: Married    Spouse name: Becky   Number of children: 1   Years of education: Not on file   Highest education level: Not on file  Occupational History   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    Employer: S[employer]  Tobacco Use   Smoking status: Never   Smokeless tobacco: Never  Substance and Sexual Activity   Alcohol use: Yes    Comment: ocass- once or twice a month   Drug use: No   Sexual activity: Yes    Partners: Female  Other Topics Concern   Not on file  Social History Narrative   Exercise --a little-- walking    Social Determinants of Health   Financial Resource Strain: Low Risk     Difficulty of Paying Living Expenses: Not hard at all  Food Insecurity: No Food Insecurity   Worried About Charity fundraiser in the Last Year: Never true   [place] in the Last Year: Never true  Transportation Needs: No Transportation Needs   Lack of Transportation (Medical): No   Lack of Transportation (Non-Medical): No  Physical Activity: Inactive   Days of Exercise per Week: 0 days   Minutes of Exercise per Session: 0 min  Stress: No Stress Concern Present   Feeling of Stress : Not at all  Social Connections: Socially Integrated   Frequency of Communication with Friends and Family: More than three times a week   Frequency of Social Gatherings with Friends and Family: More than three times a week   Attends Religious Services: More than 4 times per year   Active Member of [employer] or Organizations: Yes   Attends Music therapist: More than 4 times per year   Marital Status: Married    Tobacco Counseling Counseling given: Not Answered   Clinical Intake:  Pre-visit preparation completed: Yes  Pain : No/denies pain     BMI - recorded: 41.37 Nutritional Status: BMI > 30  Obese Nutritional Risks: None Diabetes: Yes CBG done?: No Did pt. bring in CBG monitor from home?: No  How often do you need to have someone help you when you read instructions, pamphlets, or other written materials from your doctor or pharmacy?: 1 - Never  Diabetes:  Is the patient diabetic?  Yes  If diabetic, was a CBG obtained today?  No  Did the patient bring in their glucometer from home?  No  How often do you monitor your CBG's? never.   Financial Strains and Diabetes Management:  Are you having any financial strains with the device, your supplies or your medication? No .  Does the patient want to be seen by Chronic Care Management for management of their diabetes?  No  Would the patient like to be referred to a Nutritionist or for Diabetic Management?  No   Diabetic  Exams:  Diabetic Eye Exam: Completed 6 months ago per patient.   Diabetic Foot Exam: Completed 12/06/2020.   Interpreter Needed?: No  Information entered by :: Caroleen Hamman LPN   Activities of Daily Living In your present state of health, do you have any difficulty performing the following activities: 06/06/2021  Hearing? N  Vision? N  Difficulty concentrating or making decisions? N  Walking or climbing stairs? N  Dressing or bathing? N  Doing errands, shopping? N  Preparing Food and eating ? N  Using the Toilet? N  In the past six months, have you accidently leaked urine? N  Do you have problems with loss of bowel control? N  Managing your Medications? N  Managing your Finances? N  Housekeeping or managing your Housekeeping? N  Some recent data might be hidden    Patient Care Team: Carollee Herter, Alferd Apa, DO as PCP - General Irine Seal, MD as Attending Physician (Urology) Irine Seal, MD as Attending Physician (Urology) Donell Sievert, MD as Referring Physician (Ophthalmology)  Indicate any recent Medical Services you may have received from other than [facility] providers in the past year (date may be approximate).     Assessment:   This is a routine wellness examination for Daniel Powers.  Hearing/Vision screen Hearing Screening - Comments:: No issues Vision Screening - Comments:: Last eye exam-6 months ago  Dietary issues and exercise activities discussed: Current Exercise Habits: The patient does not participate in regular exercise at present, Exercise limited by: None identified   Goals Addressed             This Visit's Progress    Patient Stated       Increase exercise       Depression Screen PHQ 2/9 Scores 06/06/2021 12/04/2019 12/25/2018 08/05/2018 09/09/2016 06/29/2016 11/20/2012  PHQ - 2 Score 0 0 0 0 0 0 0  PHQ- 9 Score - - 1 0 - - -  Exception Documentation - - Medical reason - - - -    Fall Risk Fall Risk  06/06/2021 12/04/2019 09/09/2016 06/29/2016   Falls in the past year? 0 0 No No  Number falls in past yr: 0 0 - -  Injury with Fall? 0 0 - -  Follow up Falls prevention discussed Falls evaluation completed - -    FALL RISK PREVENTION PERTAINING TO THE HOME:  Any stairs in or around the home? Yes  If so, are there any without handrails? No  Home free of loose throw rugs in walkways, pet beds, electrical cords, etc? Yes  Adequate lighting in your home to reduce risk of falls? Yes   ASSISTIVE DEVICES UTILIZED TO PREVENT FALLS:  Life alert? No  Use of a cane, walker or w/c? No  Grab bars in the bathroom? Yes  Shower chair or bench in shower? No  Elevated toilet seat or a handicapped toilet? No   TIMED UP AND GO:  Was the test performed? Yes .  Length of time to ambulate 10 feet: 10 sec.   Gait steady and fast without use of assistive device  Cognitive Function:Normal cognitive status assessed by direct observation by this Nurse Health Advisor. No abnormalities found.          Immunizations Immunization History  Administered Date(s) Administered   Fluad Quad(high Dose 65+) 06/07/2020, 06/06/2021   Influenza Split 05/04/2011   Influenza Whole 07/26/2010   Influenza,inj,Quad PF,6+ Mos 06/03/2013, 06/18/2014, 07/29/2015, 06/29/2016, 05/08/2017, 04/24/2019   Influenza-Unspecified 06/20/2018   PFIZER Comirnaty(Gray Top)Covid-19 Tri-Sucrose Vaccine 11/17/2020   PFIZER(Purple Top)SARS-COV-2 Vaccination 09/07/2019, 10/04/2019   PNEUMOCOCCAL CONJUGATE-20 12/06/2020   Pneumococcal Polysaccharide-23 09/12/2010   Td 09/06/2009   Zoster Recombinat (Shingrix) 06/28/2017, 11/08/2017   Zoster, Live 11/26/2014    TDAP status: Due, Education has been provided regarding the importance of this vaccine. Advised may receive this vaccine at local pharmacy or Health Dept. Aware to provide a copy of the vaccination record if obtained from local pharmacy or Health Dept. Verbalized acceptance and understanding.  Flu Vaccine status:  Completed at today's visit  Pneumococcal vaccine  status: Up to date  Covid-19 vaccine status: Information provided on how to obtain vaccines.   Qualifies for Shingles Vaccine? No   Zostavax completed Yes   Shingrix Completed?: Yes  Screening Tests Health Maintenance  Topic Date Due   TETANUS/TDAP  09/07/2019   OPHTHALMOLOGY EXAM  07/17/2020   COVID-19 Vaccine (4 - Booster for Pfizer series) 01/12/2021   HEMOGLOBIN A1C  10/17/2021   FOOT EXAM  12/06/2021   COLONOSCOPY (Pts 45-62yrs Insurance coverage will need to be confirmed)  05/01/2022   Pneumonia Vaccine 56+ Years old  Completed   INFLUENZA VACCINE  Completed   Hepatitis C Screening  Completed   Zoster Vaccines- Shingrix  Completed   HPV VACCINES  Aged Out    Health Maintenance  Health Maintenance Due  Topic Date Due   TETANUS/TDAP  09/07/2019   OPHTHALMOLOGY EXAM  07/17/2020   COVID-19 Vaccine (4 - Booster for [place] series) 01/12/2021    Colorectal cancer screening: Type of screening: Colonoscopy. Completed 05/01/2017. Repeat every 5 years  Lung Cancer Screening: (Low Dose CT Chest recommended if Age 72-80 years, 30 pack-year currently smoking OR have quit w/in 15years.) does not qualify.     Additional Screening:  Hepatitis C Screening: Completed 06/29/2016  Vision Screening: Recommended annual ophthalmology exams for early detection of glaucoma and other disorders of the eye. Is the patient up to date with their annual eye exam?  Yes  Who is the provider or what is the name of the office in which the patient attends annual eye exams? Pt unsure of name   Dental Screening: Recommended annual dental exams for proper oral hygiene  Community Resource Referral / Chronic Care Management: CRR required this visit?  No   CCM required this visit?  No      Plan:     I have personally reviewed and noted the following in the patient's chart:   Medical and social history Use of alcohol, tobacco or illicit  drugs  Current medications and supplements including opioid prescriptions. Patient is not currently taking opioid prescriptions. Functional ability and status Nutritional status Physical activity Advanced directives List of other physicians Hospitalizations, surgeries, and ER visits in previous 12 months Vitals Screenings to include cognitive, depression, and falls Referrals and appointments  In addition, I have reviewed and discussed with patient certain preventive protocols, quality metrics, and best practice recommendations. A written personalized care plan for preventive services as well as general preventive health recommendations were provided to patient.   Patient to access avs on mychart  Marta Antu, [place]   [id number]  Nurse Health Advisor  Nurse Notes: None

## 2021-06-13 ENCOUNTER — Ambulatory Visit: Payer: Medicare HMO | Attending: Internal Medicine

## 2021-06-13 DIAGNOSIS — Z23 Encounter for immunization: Secondary | ICD-10-CM

## 2021-06-13 NOTE — Progress Notes (Signed)
   Covid-19 Vaccination Clinic  Name:  Daniel Powers    MRN: [id number] DOB: [birth year]  06/13/2021  [name] was observed post Covid-19 immunization for 15 minutes without incident. He was provided with Vaccine Information Sheet and instruction to access the V-Safe system.   [name] was instructed to call 911 with any severe reactions post vaccine: Difficulty breathing  Swelling of face and throat  A fast heartbeat  A bad rash all over body  Dizziness and weakness   Immunizations Administered     Name Date Dose VIS Date Route   Pfizer Covid-19 Vaccine Bivalent Booster 06/13/2021  9:16 AM 0.3 mL 03/16/2021 Intramuscular   Manufacturer: [place]   Lot: [id number]   [place]: [phone]

## 2021-06-18 ENCOUNTER — Other Ambulatory Visit (HOSPITAL_BASED_OUTPATIENT_CLINIC_OR_DEPARTMENT_OTHER): Payer: Self-pay

## 2021-06-18 MED ORDER — PFIZER COVID-19 VAC BIVALENT 30 MCG/0.3ML IM SUSP
INTRAMUSCULAR | 0 refills | Status: DC
  Filled 2021-06-18: qty 0.3, 1d supply, fill #0

## 2021-06-20 ENCOUNTER — Other Ambulatory Visit (HOSPITAL_BASED_OUTPATIENT_CLINIC_OR_DEPARTMENT_OTHER): Payer: Self-pay

## 2021-06-22 ENCOUNTER — Ambulatory Visit (INDEPENDENT_AMBULATORY_CARE_PROVIDER_SITE_OTHER): Payer: Medicare HMO | Admitting: Physician Assistant

## 2021-07-04 ENCOUNTER — Ambulatory Visit (INDEPENDENT_AMBULATORY_CARE_PROVIDER_SITE_OTHER): Payer: Medicare HMO | Admitting: Physician Assistant

## 2021-07-04 ENCOUNTER — Other Ambulatory Visit: Payer: Self-pay

## 2021-07-04 VITALS — BP 133/81 | HR 85 | Temp 98.3°F | Ht 71.0 in | Wt 288.0 lb

## 2021-07-04 DIAGNOSIS — E1169 Type 2 diabetes mellitus with other specified complication: Secondary | ICD-10-CM | POA: Diagnosis not present

## 2021-07-04 DIAGNOSIS — Z6841 Body Mass Index (BMI) 40.0 and over, adult: Secondary | ICD-10-CM | POA: Diagnosis not present

## 2021-07-04 NOTE — Progress Notes (Signed)
Chief Complaint:   OBESITY Efren is here to discuss his progress with his obesity treatment plan along with follow-up of his obesity related diagnoses. Toretto is on the Category 3 Plan and states he is following his eating plan approximately 50% of the time. Booker states he is walking for 60 minutes 4 times per week.  Today's visit was #: 64 Starting weight: 309 lbs Starting date: 12/25/2018 Today's weight: 288 lbs Today's date: 07/04/2021 Total lbs lost to date: 21 lbs Total lbs lost since last in-office visit: 0  Interim History: Sonnie reports he over indulged somewhat over the Thanksgiving holiday. He continues to skip lunch at work. He is traveling to [place] for Christmas.  Subjective:   1. Type 2 diabetes mellitus with other specified complication, without long-term current use of insulin (HCC) Diondre is currently not on medications. His last A1C level was 5.8.  Assessment/Plan:   1. Type 2 diabetes mellitus with other specified complication, without long-term current use of insulin (HCC) Mehkai will continue with the plan. Good blood sugar control is important to decrease the likelihood of diabetic complications such as nephropathy, neuropathy, limb loss, blindness, coronary artery disease, and death. Intensive lifestyle modification including diet, exercise and weight loss are the first line of treatment for diabetes.   2. Obesity: Current BMI 40.19 Shahrukh is currently in the action stage of change. As such, his goal is to continue with weight loss efforts. He has agreed to the Category 3 Plan.   Exercise goals:  As is.  Behavioral modification strategies: increasing lean protein intake, no skipping meals, and holiday eating strategies .  Zakaria has agreed to follow-up with our clinic in 3 weeks. He was informed of the importance of frequent follow-up visits to maximize his success with intensive lifestyle modifications for his multiple health conditions.    Objective:   Blood pressure 133/81, pulse 85, temperature 98.3 F (36.8 C), height 5\' 11"  (1.803 m), weight 288 lb (130.6 kg), SpO2 98 %. Body mass index is 40.17 kg/m.  General: Cooperative, alert, well developed, in no acute distress. HEENT: Conjunctivae and lids unremarkable. Cardiovascular: Regular rhythm.  Lungs: Normal work of breathing. Neurologic: No focal deficits.   Lab Results  Component Value Date   CREATININE 0.95 04/18/2021   BUN 17 04/18/2021   NA 140 04/18/2021   K 4.2 04/18/2021   CL 105 04/18/2021   CO2 23 04/18/2021   Lab Results  Component Value Date   ALT 12 04/18/2021   AST 14 04/18/2021   ALKPHOS 44 04/18/2021   BILITOT 0.5 04/18/2021   Lab Results  Component Value Date   HGBA1C 5.8 (H) 04/18/2021   HGBA1C 5.7 (H) 09/30/2020   HGBA1C 5.9 (H) 05/31/2020   HGBA1C 5.8 (H) 02/04/2020   HGBA1C 5.6 09/16/2019   Lab Results  Component Value Date   INSULIN 7.6 09/30/2020   INSULIN 11.1 05/31/2020   INSULIN 6.7 02/04/2020   INSULIN 9.5 12/25/2018   Lab Results  Component Value Date   TSH 0.920 12/25/2018   Lab Results  Component Value Date   CHOL 120 12/06/2020   HDL 40.70 12/06/2020   LDLCALC 69 12/06/2020   TRIG 49.0 12/06/2020   CHOLHDL 3 12/06/2020   Lab Results  Component Value Date   VD25OH 72.7 04/18/2021   VD25OH 58.8 09/30/2020   VD25OH 88.3 05/31/2020   Lab Results  Component Value Date   WBC 6.5 12/25/2018   HGB 14.5 12/25/2018   HCT 45.1  12/25/2018   MCV 84 12/25/2018   PLT 221.0 06/28/2017   No results found for: IRON, TIBC, FERRITIN  Obesity Behavioral Intervention:   Approximately 15 minutes were spent on the discussion below.  ASK: We discussed the diagnosis of obesity with [name] today and Vishnu agreed to give [name] permission to discuss obesity behavioral modification therapy today.  ASSESS: Efrem has the diagnosis of obesity and his BMI today is 40.2. Jaylun is in the action stage of change.    ADVISE: Meldon was educated on the multiple health risks of obesity as well as the benefit of weight loss to improve his health. He was advised of the need for long term treatment and the importance of lifestyle modifications to improve his current health and to decrease his risk of future health problems.  AGREE: Multiple dietary modification options and treatment options were discussed and Timtohy agreed to follow the recommendations documented in the above note.  ARRANGE: Neely was educated on the importance of frequent visits to treat obesity as outlined per CMS and USPSTF guidelines and agreed to schedule his next follow up appointment today.  Attestation Statements:   Reviewed by clinician on day of visit: allergies, medications, problem list, medical history, surgical history, family history, social history, and previous encounter notes.  I, Tonye Pearson, am acting as Location manager for [employer], PA-C.  I have reviewed the above documentation for accuracy and completeness, and I agree with the above. Abby Potash, PA-C

## 2021-08-01 ENCOUNTER — Ambulatory Visit (INDEPENDENT_AMBULATORY_CARE_PROVIDER_SITE_OTHER): Payer: Medicare HMO | Admitting: Physician Assistant

## 2021-08-03 DIAGNOSIS — Z20822 Contact with and (suspected) exposure to covid-19: Secondary | ICD-10-CM | POA: Diagnosis not present

## 2021-08-15 ENCOUNTER — Other Ambulatory Visit: Payer: Self-pay | Admitting: Family Medicine

## 2021-08-15 DIAGNOSIS — I10 Essential (primary) hypertension: Secondary | ICD-10-CM

## 2021-08-16 ENCOUNTER — Encounter (INDEPENDENT_AMBULATORY_CARE_PROVIDER_SITE_OTHER): Payer: Self-pay | Admitting: Physician Assistant

## 2021-08-16 ENCOUNTER — Ambulatory Visit (INDEPENDENT_AMBULATORY_CARE_PROVIDER_SITE_OTHER): Payer: Medicare HMO | Admitting: Physician Assistant

## 2021-08-16 ENCOUNTER — Other Ambulatory Visit: Payer: Self-pay

## 2021-08-16 VITALS — BP 134/86 | HR 77 | Temp 98.1°F | Ht 71.0 in | Wt 287.0 lb

## 2021-08-16 DIAGNOSIS — E669 Obesity, unspecified: Secondary | ICD-10-CM | POA: Diagnosis not present

## 2021-08-16 DIAGNOSIS — Z6841 Body Mass Index (BMI) 40.0 and over, adult: Secondary | ICD-10-CM | POA: Diagnosis not present

## 2021-08-16 DIAGNOSIS — E785 Hyperlipidemia, unspecified: Secondary | ICD-10-CM

## 2021-08-16 DIAGNOSIS — E1169 Type 2 diabetes mellitus with other specified complication: Secondary | ICD-10-CM

## 2021-08-16 NOTE — Progress Notes (Signed)
Chief Complaint:   OBESITY Daniel Powers is here to discuss his progress with his obesity treatment plan along with follow-up of his obesity related diagnoses. Daniel Powers is on the Category 3 Plan and states he is following his eating plan approximately 50% of the time. Daniel Powers states he is doing 0 minutes 0 times per week.  Today's visit was #: 85 Starting weight: 309 lbs Starting date: 12/25/2018 Today's weight: 287 lbs Today's date: 08/16/2021 Total lbs lost to date: 22 Total lbs lost since last in-office visit: 1  Interim History: Daniel Powers reports doing some mindful eating and he is feeling good about that progress. He continues to skip lunch, but he is drinking a protein shake or eating protein bars when skipping.   Subjective:   1. Type 2 diabetes mellitus with other specified complication, without long-term current use of insulin (HCC) Daniel Powers is not on medications, and he denies polyphagia. His last A1c was well controlled at 5.8.  Assessment/Plan:   1. Type 2 diabetes mellitus with other specified complication, without long-term current use of insulin (HCC) Daniel Powers we will recheck fasting labs at [facility] next office visit. Good blood sugar control is important to decrease the likelihood of diabetic complications such as nephropathy, neuropathy, limb loss, blindness, coronary artery disease, and death. Intensive lifestyle modification including diet, exercise and weight loss are the first line of treatment for diabetes.   2. Obesity: Current BMI 40.05 Daniel Powers is currently in the action stage of change. As such, his goal is to continue with weight loss efforts. He has agreed to the Category 3 Plan.   We will recheck fasting labs at his next visit.  Exercise goals: No exercise has been prescribed at this time.  Behavioral modification strategies: no skipping meals and meal planning and cooking strategies.  Daniel Powers has agreed to follow-up with our clinic in 4 weeks. He was informed of the  importance of frequent follow-up visits to maximize his success with intensive lifestyle modifications for his multiple health conditions.   Objective:   Blood pressure 134/86, pulse 77, temperature 98.1 F (36.7 C), height 5\' 11"  (1.803 m), weight 287 lb (130.2 kg), SpO2 100 %. Body mass index is 40.03 kg/m.  General: Cooperative, alert, well developed, in no acute distress. HEENT: Conjunctivae and lids unremarkable. Cardiovascular: Regular rhythm.  Lungs: Normal work of breathing. Neurologic: No focal deficits.   Lab Results  Component Value Date   CREATININE 0.95 04/18/2021   BUN 17 04/18/2021   NA 140 04/18/2021   K 4.2 04/18/2021   CL 105 04/18/2021   CO2 23 04/18/2021   Lab Results  Component Value Date   ALT 12 04/18/2021   AST 14 04/18/2021   ALKPHOS 44 04/18/2021   BILITOT 0.5 04/18/2021   Lab Results  Component Value Date   HGBA1C 5.8 (H) 04/18/2021   HGBA1C 5.7 (H) 09/30/2020   HGBA1C 5.9 (H) 05/31/2020   HGBA1C 5.8 (H) 02/04/2020   HGBA1C 5.6 09/16/2019   Lab Results  Component Value Date   INSULIN 7.6 09/30/2020   INSULIN 11.1 05/31/2020   INSULIN 6.7 02/04/2020   INSULIN 9.5 12/25/2018   Lab Results  Component Value Date   TSH 0.920 12/25/2018   Lab Results  Component Value Date   CHOL 120 12/06/2020   HDL 40.70 12/06/2020   LDLCALC 69 12/06/2020   TRIG 49.0 12/06/2020   CHOLHDL 3 12/06/2020   Lab Results  Component Value Date   VD25OH 72.7 04/18/2021   VD25OH  58.8 09/30/2020   VD25OH 88.3 05/31/2020   Lab Results  Component Value Date   WBC 6.5 12/25/2018   HGB 14.5 12/25/2018   HCT 45.1 12/25/2018   MCV 84 12/25/2018   PLT 221.0 06/28/2017   No results found for: IRON, TIBC, FERRITIN  Obesity Behavioral Intervention:   Approximately 15 minutes were spent on the discussion below.  ASK: We discussed the diagnosis of obesity with Daniel Powers today and Daniel Powers agreed to give [name] permission to discuss obesity behavioral modification  therapy today.  ASSESS: Daniel Powers has the diagnosis of obesity and his BMI today is 40.1. Daniel Powers is in the action stage of change.   ADVISE: Daniel Powers was educated on the multiple health risks of obesity as well as the benefit of weight loss to improve his health. He was advised of the need for long term treatment and the importance of lifestyle modifications to improve his current health and to decrease his risk of future health problems.  AGREE: Multiple dietary modification options and treatment options were discussed and Daniel Powers agreed to follow the recommendations documented in the above note.  ARRANGE: Daniel Powers was educated on the importance of frequent visits to treat obesity as outlined per CMS and USPSTF guidelines and agreed to schedule his next follow up appointment today.  Attestation Statements:   Reviewed by clinician on day of visit: allergies, medications, problem list, medical history, surgical history, family history, social history, and previous encounter notes.   Daniel Powers, am acting as transcriptionist for [employer], PA-C.  I have reviewed the above documentation for accuracy and completeness, and I agree with the above. Abby Potash, PA-C

## 2021-08-22 DIAGNOSIS — R972 Elevated prostate specific antigen [PSA]: Secondary | ICD-10-CM | POA: Diagnosis not present

## 2021-09-05 ENCOUNTER — Other Ambulatory Visit: Payer: Self-pay | Admitting: Family Medicine

## 2021-09-05 DIAGNOSIS — I10 Essential (primary) hypertension: Secondary | ICD-10-CM

## 2021-09-05 DIAGNOSIS — Z20822 Contact with and (suspected) exposure to covid-19: Secondary | ICD-10-CM | POA: Diagnosis not present

## 2021-09-09 ENCOUNTER — Ambulatory Visit (INDEPENDENT_AMBULATORY_CARE_PROVIDER_SITE_OTHER): Payer: Medicare HMO | Admitting: Family Medicine

## 2021-09-09 ENCOUNTER — Other Ambulatory Visit: Payer: Self-pay | Admitting: Family Medicine

## 2021-09-09 ENCOUNTER — Encounter: Payer: Self-pay | Admitting: Family Medicine

## 2021-09-09 VITALS — BP 122/78 | HR 79 | Temp 98.1°F | Ht 71.0 in | Wt 291.5 lb

## 2021-09-09 DIAGNOSIS — J069 Acute upper respiratory infection, unspecified: Secondary | ICD-10-CM | POA: Diagnosis not present

## 2021-09-09 DIAGNOSIS — E785 Hyperlipidemia, unspecified: Secondary | ICD-10-CM

## 2021-09-09 MED ORDER — FLUTICASONE PROPIONATE 50 MCG/ACT NA SUSP: 2.0000 | Freq: Every day | NASAL | 6 refills | Status: AC

## 2021-09-09 MED ORDER — HYDROCODONE BIT-HOMATROP MBR 5-1.5 MG/5ML PO SOLN: 5.0000 mL | Freq: Four times a day (QID) | ORAL | 0 refills | Status: DC | PRN

## 2021-09-09 NOTE — Patient Instructions (Addendum)
Continue to push fluids, practice good hand hygiene, and cover your mouth if you cough.  If you start having fevers, shaking or shortness of breath, seek immediate care.  OK to take Tylenol 1000 mg (2 extra strength tabs) or 975 mg (3 regular strength tabs) every 6 hours as needed.  Go back on Flonase.  Do not drink alcohol, do any illicit/street drugs, drive or do anything that requires alertness while on this medicine.   Let us know if you need anything.

## 2021-09-09 NOTE — Progress Notes (Signed)
Chief Complaint  Patient presents with   Cough    Congestion     Daniel Powers here for URI complaints.  Duration: 6 days  Associated symptoms: Fever (99.5 F), sinus congestion, rhinorrhea, itchy watery eyes, and coughing Denies: sinus pain, ear pain, ear drainage, wheezing, shortness of breath, myalgia, and N/V/D Treatment to date: Dayquil Sick contacts: Yes; yes, wife Tested neg for covid.   Past Medical History:  Diagnosis Date   Anxiety    Arthritis    Asthma    COPD (chronic obstructive pulmonary disease) (HCC)    DVT (deep venous thrombosis) (HCC)    Gout    High cholesterol    Hypertension    Influenza with pneumonia    Lactose intolerance    Obesity    Pre-diabetes    Psychosexual dysfunction with inhibited sexual excitement     Objective BP 122/78    Pulse 79    Temp 98.1 F (36.7 C) (Oral)    Ht 5\' 11"  (1.803 m)    Wt 291 lb 8 oz (132.2 kg)    SpO2 99%    BMI 40.66 kg/m  General: Awake, alert, appears stated age HEENT: AT, [place], ears patent b/l and TM neg on R, retracted w/o fluid or erythema on L, nares patent, L turbinate edematous w discharge, pharynx pink and without exudates, MMM Neck: No masses or asymmetry Heart: RRR Lungs: CTAB, no accessory muscle use Psych: Age appropriate judgment and insight, normal mood and affect  Viral URI with cough - Plan: fluticasone (FLONASE) 50 MCG/ACT nasal spray, HYDROcodone bit-homatropine (HYCODAN) 5-1.5 MG/5ML syrup  INCS. Hycodan as above. Warnings about drowsiness discussed. He has done well with this in the past. Continue to push fluids, practice good hand hygiene, cover mouth when coughing. F/u prn. If starting to experience fevers, shaking, or shortness of breath, seek immediate care. Pt voiced understanding and agreement to the plan.  Philip, DO 09/09/21 3:56 PM

## 2021-09-14 ENCOUNTER — Ambulatory Visit (INDEPENDENT_AMBULATORY_CARE_PROVIDER_SITE_OTHER): Payer: Medicare HMO | Admitting: Physician Assistant

## 2021-09-30 DIAGNOSIS — H2513 Age-related nuclear cataract, bilateral: Secondary | ICD-10-CM | POA: Diagnosis not present

## 2021-09-30 DIAGNOSIS — E113293 Type 2 diabetes mellitus with mild nonproliferative diabetic retinopathy without macular edema, bilateral: Secondary | ICD-10-CM | POA: Diagnosis not present

## 2021-09-30 LAB — HM DIABETES EYE EXAM

## 2021-10-03 ENCOUNTER — Other Ambulatory Visit: Payer: Self-pay

## 2021-10-03 ENCOUNTER — Ambulatory Visit (INDEPENDENT_AMBULATORY_CARE_PROVIDER_SITE_OTHER): Payer: Medicare HMO | Admitting: Family Medicine

## 2021-10-03 ENCOUNTER — Encounter (INDEPENDENT_AMBULATORY_CARE_PROVIDER_SITE_OTHER): Payer: Self-pay | Admitting: Family Medicine

## 2021-10-03 VITALS — BP 134/67 | HR 66 | Temp 98.2°F | Ht 71.0 in | Wt 286.0 lb

## 2021-10-03 DIAGNOSIS — E559 Vitamin D deficiency, unspecified: Secondary | ICD-10-CM | POA: Diagnosis not present

## 2021-10-03 DIAGNOSIS — I152 Hypertension secondary to endocrine disorders: Secondary | ICD-10-CM

## 2021-10-03 DIAGNOSIS — E538 Deficiency of other specified B group vitamins: Secondary | ICD-10-CM | POA: Diagnosis not present

## 2021-10-03 DIAGNOSIS — E785 Hyperlipidemia, unspecified: Secondary | ICD-10-CM | POA: Diagnosis not present

## 2021-10-03 DIAGNOSIS — E1169 Type 2 diabetes mellitus with other specified complication: Secondary | ICD-10-CM | POA: Diagnosis not present

## 2021-10-03 DIAGNOSIS — Z6839 Body mass index (BMI) 39.0-39.9, adult: Secondary | ICD-10-CM | POA: Diagnosis not present

## 2021-10-03 DIAGNOSIS — E669 Obesity, unspecified: Secondary | ICD-10-CM

## 2021-10-03 DIAGNOSIS — E1159 Type 2 diabetes mellitus with other circulatory complications: Secondary | ICD-10-CM

## 2021-10-04 LAB — COMPREHENSIVE METABOLIC PANEL
ALT: 12 IU/L (ref 0–44)
AST: 14 IU/L (ref 0–40)
Albumin/Globulin Ratio: 1.6 (ref 1.2–2.2)
Albumin: 4.1 g/dL (ref 3.8–4.8)
Alkaline Phosphatase: 48 IU/L (ref 44–121)
BUN/Creatinine Ratio: 18 (ref 10–24)
BUN: 16 mg/dL (ref 8–27)
Bilirubin Total: 0.9 mg/dL (ref 0.0–1.2)
CO2: 21 mmol/L (ref 20–29)
Calcium: 9.1 mg/dL (ref 8.6–10.2)
Chloride: 104 mmol/L (ref 96–106)
Creatinine, Ser: 0.89 mg/dL (ref 0.76–1.27)
Globulin, Total: 2.5 g/dL (ref 1.5–4.5)
Glucose: 97 mg/dL (ref 70–99)
Potassium: 3.8 mmol/L (ref 3.5–5.2)
Sodium: 139 mmol/L (ref 134–144)
Total Protein: 6.6 g/dL (ref 6.0–8.5)
eGFR: 94 mL/min/{1.73_m2} (ref >59)

## 2021-10-04 LAB — CBC WITH DIFFERENTIAL/PLATELET
Basophils Absolute: 0 10*3/uL (ref 0.0–0.2)
Basos: 1 %
EOS (ABSOLUTE): 0.2 10*3/uL (ref 0.0–0.4)
Eos: 3 %
Hematocrit: 43.8 % (ref 37.5–51.0)
Hemoglobin: 14.5 g/dL (ref 13.0–17.7)
Immature Grans (Abs): 0 10*3/uL (ref 0.0–0.1)
Immature Granulocytes: 0 %
Lymphocytes Absolute: 1.8 10*3/uL (ref 0.7–3.1)
Lymphs: 29 %
MCH: 28.4 pg (ref 26.6–33.0)
MCHC: 33.1 g/dL (ref 31.5–35.7)
MCV: 86 fL (ref 79–97)
Monocytes Absolute: 0.6 10*3/uL (ref 0.1–0.9)
Monocytes: 9 %
Neutrophils Absolute: 3.6 10*3/uL (ref 1.4–7.0)
Neutrophils: 58 %
Platelets: 191 10*3/uL (ref 150–450)
RBC: 5.1 x10E6/uL (ref 4.14–5.80)
RDW: 13.5 % (ref 11.6–15.4)
WBC: 6.2 10*3/uL (ref 3.4–10.8)

## 2021-10-04 LAB — LIPID PANEL
Chol/HDL Ratio: 3.2 ratio (ref 0.0–5.0)
Cholesterol, Total: 142 mg/dL (ref 100–199)
HDL: 44 mg/dL (ref >39)
LDL Chol Calc (NIH): 85 mg/dL (ref 0–99)
Triglycerides: 65 mg/dL (ref 0–149)
VLDL Cholesterol Cal: 13 mg/dL (ref 5–40)

## 2021-10-04 LAB — INSULIN, RANDOM: INSULIN: 11 u[IU]/mL (ref 2.6–24.9)

## 2021-10-04 LAB — VITAMIN B12: Vitamin B-12: 448 pg/mL (ref 232–1245)

## 2021-10-04 LAB — VITAMIN D 25 HYDROXY (VIT D DEFICIENCY, FRACTURES): Vit D, 25-Hydroxy: 69.7 ng/mL (ref 30.0–100.0)

## 2021-10-04 LAB — HEMOGLOBIN A1C
Est. average glucose Bld gHb Est-mCnc: 117 mg/dL
Hgb A1c MFr Bld: 5.7 % — ABNORMAL HIGH (ref 4.8–5.6)

## 2021-10-09 NOTE — Progress Notes (Signed)
? ? ? ?Chief Complaint:  ? ?OBESITY ?Daniel Powers is here to discuss his progress with his obesity treatment plan along with follow-up of his obesity related diagnoses. Daniel Powers is on the Category 3 Plan and states he is following his eating plan approximately 50% of the time. Trygg states he is not currently exercising. ? ?Today's visit was #: 17 ?Starting weight: 309 lbs ?Starting date: 12/24/2020 ?Today's weight: 286 lbs ?Today's date: 10/03/2021 ?Total lbs lost to date: 64 ?Total lbs lost since last in-office visit: 1 ? ?Interim History: This is Daniel Powers first visit with me, as he usually sees [place] or [place]. He skips lunch and he loves his cookies and ice cream. ? ?Subjective:  ? ?1. Type 2 diabetes mellitus with other specified complication, without long-term current use of insulin ([place]) ?Daniel Powers is asymptomatic with no concerns. Never checks his blood sugars. Diet controlled-cinnamon. ? ?2. Hypertension associated with type 2 diabetes mellitus ([place]) ?Daniel Powers's blood pressure runs at 130's/70's-120's/60's. He checks his blood pressure once daily. He is on Norvasc and Benicar. ? ?3. Hyperlipidemia associated with type 2 diabetes mellitus ([place]) ?Daniel Powers's medication compliance is good. He denies side effects or concerns. He is on Lipitor and fish oil. ? ?4. Vitamin D deficiency ?Daniel Powers takes once daily OTC 4,000 IU. He denies symptoms. ? ?5. B12 deficiency ?Last B12 was checked in June 2020, and it was at 276. Daniel Powers does not take B12 supplementation or multivitamins. He was never told it was low.  ? ?Assessment/Plan:  ? ?Orders Placed This Encounter  ?Procedures  ? Comprehensive metabolic panel  ? CBC with Differential/Platelet  ? Hemoglobin A1c  ? Insulin, random  ? VITAMIN D 25 Hydroxy (Vit-D Deficiency, Fractures)  ? Lipid panel  ? Vitamin B12  ? ? ?There are no discontinued medications.  ? ?No orders of the defined types were placed in this encounter. ?  ? ?1. Type 2 diabetes mellitus with other specified complication,  without long-term current use of insulin ([place]) ?We will check labs today. Maven will decrease simple carbs and increase fiber and protein. ? ?- Hemoglobin A1c ?- Insulin, random ? ?2. Hypertension associated with type 2 diabetes mellitus ([place]) ?We will check labs today, at goal. He will continue his medications, decrease salt/pre-packaged food.  ? ?- Comprehensive metabolic panel ? ?3. Hyperlipidemia associated with type 2 diabetes mellitus ([place]) ?We will check labs today. [name] will decrease salt/trans fats. Only eat lean protein. ? ?- Comprehensive metabolic panel ?- Lipid panel ? ?4. Vitamin D deficiency ?We will check labs today. Jarrell will continue his OTC Vitamin D supplementation. ? ?- VITAMIN D 25 Hydroxy (Vit-D Deficiency, Fractures) ? ?5. B12 deficiency ?We will check labs today. Last B12 was not at goal when checked at his last office visit. The diagnosis was reviewed with the patient. Counseling provided today, see below. We will continue to monitor. Orders and follow up as documented in patient record. ? ?Counseling ?The body needs vitamin B12: to make red blood cells; to make DNA; and to help the nerves work properly so they can carry messages from the brain to the body.  ?The main causes of vitamin B12 deficiency include dietary deficiency, digestive diseases, pernicious anemia, and having a surgery in which part of the stomach or small intestine is removed.  ?Certain medicines can make it harder for the body to absorb vitamin B12. These medicines include: heartburn medications; some antibiotics; some medications used to treat diabetes, gout, and high cholesterol.  ?In some cases, there are  no symptoms of this condition. If the condition leads to anemia or nerve damage, various symptoms can occur, such as weakness or fatigue, shortness of breath, and numbness or tingling in your hands and feet.   ?Treatment:  ?May include taking vitamin B12 supplements.  ?Avoid alcohol.  ?Eat lots of healthy foods  that contain vitamin B12: ?Beef, pork, chicken, [place], and organ meats, such as liver.  ?Seafood: This includes clams, rainbow trout, salmon, tuna, and haddock. Eggs.  ?Cereal and dairy products that are fortified: This means that vitamin B12 has been added to the food.  ? ?- CBC with Differential/Platelet ?- Vitamin B12 ? ?6. Obesity with current BMI of 39.9 ?Daniel Powers is currently in the action stage of change. As such, his goal is to continue with weight loss efforts. He has agreed to the Category 3 Plan.  ? ?Focus on lunch meal and eat food. We discussed strategies and as last resort, drink 3 protein shakes at 150-160 calories each. ? ?Exercise goals: All adults should avoid inactivity. Some physical activity is better than none, and adults who participate in any amount of physical activity gain some health benefits. Start walking. ? ?Behavioral modification strategies: increasing lean protein intake, no skipping meals, better snacking choices, and avoiding temptations. ? ?Daniel Powers has agreed to follow-up with our clinic in 4 weeks with Abby Potash, PA-C. He was informed of the importance of frequent follow-up visits to maximize his success with intensive lifestyle modifications for his multiple health conditions.  ? ?Kordae was informed we would discuss his lab results at his next visit unless there is a critical issue that needs to be addressed sooner. Daniel Powers agreed to keep his next visit at the agreed upon time to discuss these results. ? ?Objective:  ? ?Blood pressure 134/67, pulse 66, temperature 98.2 ?F (36.8 ?C), height '5\' 11"'$  (1.803 m), weight 286 lb (129.7 kg), SpO2 99 %. ?Body mass index is 39.89 kg/m?. ? ?General: Cooperative, alert, well developed, in no acute distress. ?HEENT: Conjunctivae and lids unremarkable. ?Cardiovascular: Regular rhythm.  ?Lungs: Normal work of breathing. ?Neurologic: No focal deficits.  ? ?Lab Results  ?Component Value Date  ? CREATININE 0.89 10/03/2021  ? BUN 16 10/03/2021   ? NA 139 10/03/2021  ? K 3.8 10/03/2021  ? CL 104 10/03/2021  ? CO2 21 10/03/2021  ? ?Lab Results  ?Component Value Date  ? ALT 12 10/03/2021  ? AST 14 10/03/2021  ? ALKPHOS 48 10/03/2021  ? BILITOT 0.9 10/03/2021  ? ?Lab Results  ?Component Value Date  ? HGBA1C 5.7 (H) 10/03/2021  ? HGBA1C 5.8 (H) 04/18/2021  ? HGBA1C 5.7 (H) 09/30/2020  ? HGBA1C 5.9 (H) 05/31/2020  ? HGBA1C 5.8 (H) 02/04/2020  ? ?Lab Results  ?Component Value Date  ? INSULIN 11.0 10/03/2021  ? INSULIN 7.6 09/30/2020  ? INSULIN 11.1 05/31/2020  ? INSULIN 6.7 02/04/2020  ? INSULIN 9.5 12/25/2018  ? ?Lab Results  ?Component Value Date  ? TSH 0.920 12/25/2018  ? ?Lab Results  ?Component Value Date  ? CHOL 142 10/03/2021  ? HDL 44 10/03/2021  ? Thermal 85 10/03/2021  ? TRIG 65 10/03/2021  ? CHOLHDL 3.2 10/03/2021  ? ?Lab Results  ?Component Value Date  ? VD25OH 69.7 10/03/2021  ? VD25OH 72.7 04/18/2021  ? VD25OH 58.8 09/30/2020  ? ?Lab Results  ?Component Value Date  ? WBC 6.2 10/03/2021  ? HGB 14.5 10/03/2021  ? HCT 43.8 10/03/2021  ? MCV 86 10/03/2021  ? PLT 191 10/03/2021  ? ?  No results found for: IRON, TIBC, FERRITIN ? ?Obesity Behavioral Intervention:  ? ?Approximately 15 minutes were spent on the discussion below. ? ?ASK: ?We discussed the diagnosis of obesity with [name] today and Mattheu agreed to give [name] permission to discuss obesity behavioral modification therapy today. ? ?ASSESS: ?Terrian has the diagnosis of obesity and his BMI today is 39.3. Vijay is in the action stage of change.  ? ?ADVISE: ?Codie was educated on the multiple health risks of obesity as well as the benefit of weight loss to improve his health. He was advised of the need for long term treatment and the importance of lifestyle modifications to improve his current health and to decrease his risk of future health problems. ? ?AGREE: ?Multiple dietary modification options and treatment options were discussed and Kailyn agreed to follow the recommendations documented in the  above note. ? ?ARRANGE: ?Curry was educated on the importance of frequent visits to treat obesity as outlined per CMS and USPSTF guidelines and agreed to schedule his next follow up appointment today.

## 2021-10-24 ENCOUNTER — Encounter (INDEPENDENT_AMBULATORY_CARE_PROVIDER_SITE_OTHER): Payer: Self-pay

## 2021-10-31 ENCOUNTER — Ambulatory Visit (INDEPENDENT_AMBULATORY_CARE_PROVIDER_SITE_OTHER): Payer: Medicare HMO | Admitting: Physician Assistant

## 2021-11-08 ENCOUNTER — Other Ambulatory Visit: Payer: Self-pay | Admitting: Family Medicine

## 2021-11-08 DIAGNOSIS — I82401 Acute embolism and thrombosis of unspecified deep veins of right lower extremity: Secondary | ICD-10-CM

## 2021-11-28 ENCOUNTER — Encounter (INDEPENDENT_AMBULATORY_CARE_PROVIDER_SITE_OTHER): Payer: Self-pay

## 2021-11-28 ENCOUNTER — Ambulatory Visit (INDEPENDENT_AMBULATORY_CARE_PROVIDER_SITE_OTHER): Payer: Medicare HMO | Admitting: Physician Assistant

## 2021-12-01 ENCOUNTER — Telehealth: Payer: Medicare HMO | Admitting: Family Medicine

## 2021-12-01 DIAGNOSIS — R051 Acute cough: Secondary | ICD-10-CM

## 2021-12-01 MED ORDER — PROMETHAZINE-DM 6.25-15 MG/5ML PO SYRP: 5.0000 mL | ORAL_SOLUTION | Freq: Two times a day (BID) | ORAL | 0 refills | Status: DC | PRN

## 2021-12-01 MED ORDER — BENZONATATE 100 MG PO CAPS: 100.0000 mg | ORAL_CAPSULE | Freq: Two times a day (BID) | ORAL | 0 refills | Status: DC | PRN

## 2021-12-01 NOTE — Progress Notes (Signed)
We are sorry that you are not feeling well.  Here is how we plan to help!  Based on your presentation I believe you most likely have A cough due to allergies.  I recommend that you start the an over-the counter-allergy medication such as Claritin 10 mg or Zyrtec 10 mg daily.   Continue taking as you reported.   In addition you may use A prescription cough medication called Tessalon Perles '100mg'$ . You may take 1-2 capsules every 8 hours as needed for your cough. And a night time cough syrup -please do not drive when taking this medication.    From your responses in the eVisit questionnaire you describe inflammation in the upper respiratory tract which is causing a significant cough.  This is commonly called Bronchitis and has four common causes:   Allergies Viral Infections Acid Reflux Bacterial Infection Allergies, viruses and acid reflux are treated by controlling symptoms or eliminating the cause. An example might be a cough caused by taking certain blood pressure medications. You stop the cough by changing the medication. Another example might be a cough caused by acid reflux. Controlling the reflux helps control the cough.  USE OF BRONCHODILATOR ("RESCUE") INHALERS: There is a risk from using your bronchodilator too frequently.  The risk is that over-reliance on a medication which only relaxes the muscles surrounding the breathing tubes can reduce the effectiveness of medications prescribed to reduce swelling and congestion of the tubes themselves.  Although you feel brief relief from the bronchodilator inhaler, your asthma may actually be worsening with the tubes becoming more swollen and filled with mucus.  This can delay other crucial treatments, such as oral steroid medications. If you need to use a bronchodilator inhaler daily, several times per day, you should discuss this with your provider.  There are probably better treatments that could be used to keep your asthma under control.      HOME CARE Only take medications as instructed by your medical team. Complete the entire course of an antibiotic. Drink plenty of fluids and get plenty of rest. Avoid close contacts especially the very young and the elderly Cover your mouth if you cough or cough into your sleeve. Always remember to wash your hands A steam or ultrasonic humidifier can help congestion.   GET HELP RIGHT AWAY IF: You develop worsening fever. You become short of breath You cough up blood. Your symptoms persist after you have completed your treatment plan MAKE SURE YOU  Understand these instructions. Will watch your condition. Will get help right away if you are not doing well or get worse.    Thank you for choosing an e-visit.  Your e-visit answers were reviewed by a board certified advanced clinical practitioner to complete your personal care plan. Depending upon the condition, your plan could have included both over the counter or prescription medications.  Please review your pharmacy choice. Make sure the pharmacy is open so you can pick up prescription now. If there is a problem, you may contact your provider through [employer] and have the prescription routed to another pharmacy.  Your safety is important to [name]. If you have drug allergies check your prescription carefully.   For the next 24 hours you can use MyChart to ask questions about today's visit, request a non-urgent call back, or ask for a work or school excuse. You will get an email in the next two days asking about your experience. I hope that your e-visit has been valuable and will speed  your recovery.  I provided 5 minutes of non face-to-face time during this encounter for chart review, medication and order placement, as well as and documentation.

## 2021-12-14 ENCOUNTER — Ambulatory Visit (INDEPENDENT_AMBULATORY_CARE_PROVIDER_SITE_OTHER): Payer: Medicare HMO | Admitting: Adult Health

## 2021-12-14 ENCOUNTER — Encounter (INDEPENDENT_AMBULATORY_CARE_PROVIDER_SITE_OTHER): Payer: Self-pay | Admitting: Adult Health

## 2021-12-14 VITALS — BP 133/77 | HR 77 | Temp 98.1°F | Ht 71.0 in | Wt 292.0 lb

## 2021-12-14 DIAGNOSIS — E785 Hyperlipidemia, unspecified: Secondary | ICD-10-CM

## 2021-12-14 DIAGNOSIS — E559 Vitamin D deficiency, unspecified: Secondary | ICD-10-CM | POA: Diagnosis not present

## 2021-12-14 DIAGNOSIS — E1169 Type 2 diabetes mellitus with other specified complication: Secondary | ICD-10-CM | POA: Diagnosis not present

## 2021-12-14 DIAGNOSIS — Z6841 Body Mass Index (BMI) 40.0 and over, adult: Secondary | ICD-10-CM | POA: Diagnosis not present

## 2021-12-14 DIAGNOSIS — E669 Obesity, unspecified: Secondary | ICD-10-CM

## 2021-12-14 DIAGNOSIS — E1159 Type 2 diabetes mellitus with other circulatory complications: Secondary | ICD-10-CM | POA: Diagnosis not present

## 2021-12-14 DIAGNOSIS — I152 Hypertension secondary to endocrine disorders: Secondary | ICD-10-CM

## 2021-12-19 NOTE — Progress Notes (Signed)
Chief Complaint:   OBESITY Daniel Powers is here to discuss his progress with his obesity treatment plan along with follow-up of his obesity related diagnoses. [place] is on the Category 3 Plan and states he is following his eating plan approximately 0% of the time. Spero states he is walking for 60 minutes 1 time per week.  Today's visit was #: 8 Starting weight: 309 lbs Starting date: 12/24/2020 Today's weight: 292 lbs Today's date: 12/14/21 Total lbs lost to date: 17 lbs Total lbs lost since last in-office visit: +6  Interim History:  He is in maintenance phase -following Cat 3 meal plan until early May 2023.   He stopped plan compliance due to pollen exposure and acute illness-cough/sneezing/watery eyes. When ill he will consume orange juice, soups, grits, and sweets (ice cream and cake). Now he feels that allergies are almost resolved at this point and will phase back to Cat 3 meal plan.  Subjective:   1. Hypertension associated with type 2 diabetes mellitus (HCC) BP/heart rate stable at office visit. 10/03/2021 CMP - electrolytes and kidney fx both stable. He is on Norvasc '5mg'$  QD, Benicar '40mg'$  QD,  Discussed labs with patient today.  2. Hyperlipidemia associated with type 2 diabetes mellitus (HCC) Worsening. Lipid Panel     Component Value Date/Time   CHOL 142 10/03/2021 0749   TRIG 65 10/03/2021 0749   HDL 44 10/03/2021 0749   CHOLHDL 3.2 10/03/2021 0749   CHOLHDL 3 12/06/2020 0912   VLDL 9.8 12/06/2020 0912   LDLCALC 85 10/03/2021 0749   LABVLDL 13 10/03/2021 0749    PCP manages Lipitor/fish oil. Discussed labs with patient today.  3. Vitamin D deficiency 10/03/21 Vit D Level- 69.7-stable He is on OTC vitamin Ds 4,000 IU once daily. Discussed labs with patient today.  Assessment/Plan:   1. Hypertension associated with type 2 diabetes mellitus ([place]) Continue current antihypertensive therapy.  2. Hyperlipidemia associated with type 2 diabetes mellitus  ([place]) Follow-up with PCP.  3. Vitamin D deficiency Continue OTC vitamin supplement.  4. Obesity with current BMI of 40.8 Daniel Powers is currently in the action stage of change. As such, his goal is to continue with weight loss efforts. He has agreed to the Category 3 Plan.  No plan listed  Exercise goals: as is.  Behavioral modification strategies: increasing lean protein intake, decreasing simple carbohydrates, meal planning and cooking strategies, keeping healthy foods in the home, better snacking choices, and planning for success.  Willett has agreed to follow-up with our clinic in 4 weeks. He was informed of the importance of frequent follow-up visits to maximize his success with intensive lifestyle modifications for his multiple health conditions.   Objective:   Blood pressure 133/77, pulse 77, temperature 98.1 F (36.7 C), height '5\' 11"'$  (1.803 m), weight 262 lb (118.8 kg), SpO2 98 %. Body mass index is 36.54 kg/m.  General: Cooperative, alert, well developed, in no acute distress. HEENT: Conjunctivae and lids unremarkable. Cardiovascular: Regular rhythm.  Lungs: Normal work of breathing. Neurologic: No focal deficits.   Lab Results  Component Value Date   CREATININE 0.89 10/03/2021   BUN 16 10/03/2021   NA 139 10/03/2021   K 3.8 10/03/2021   CL 104 10/03/2021   CO2 21 10/03/2021   Lab Results  Component Value Date   ALT 12 10/03/2021   AST 14 10/03/2021   ALKPHOS 48 10/03/2021   BILITOT 0.9 10/03/2021   Lab Results  Component Value Date   HGBA1C 5.7 (H)  10/03/2021   HGBA1C 5.8 (H) 04/18/2021   HGBA1C 5.7 (H) 09/30/2020   HGBA1C 5.9 (H) 05/31/2020   HGBA1C 5.8 (H) 02/04/2020   Lab Results  Component Value Date   INSULIN 11.0 10/03/2021   INSULIN 7.6 09/30/2020   INSULIN 11.1 05/31/2020   INSULIN 6.7 02/04/2020   INSULIN 9.5 12/25/2018   Lab Results  Component Value Date   TSH 0.920 12/25/2018   Lab Results  Component Value Date   CHOL 142 10/03/2021    HDL 44 10/03/2021   LDLCALC 85 10/03/2021   TRIG 65 10/03/2021   CHOLHDL 3.2 10/03/2021   Lab Results  Component Value Date   VD25OH 69.7 10/03/2021   VD25OH 72.7 04/18/2021   VD25OH 58.8 09/30/2020   Lab Results  Component Value Date   WBC 6.2 10/03/2021   HGB 14.5 10/03/2021   HCT 43.8 10/03/2021   MCV 86 10/03/2021   PLT 191 10/03/2021   No results found for: IRON, TIBC, FERRITIN  Attestation Statements:   Reviewed by clinician on day of visit: allergies, medications, problem list, medical history, surgical history, family history, social history, and previous encounter notes.  I, Georgianne Fick, FNP, am acting as Location manager for Mina Marble, NP.  I have reviewed the above documentation for accuracy and completeness, and I agree with the above. -  Reana Chacko d. Dempsey Knotek, NP-C

## 2021-12-22 ENCOUNTER — Ambulatory Visit (INDEPENDENT_AMBULATORY_CARE_PROVIDER_SITE_OTHER): Payer: Medicare HMO | Admitting: Family Medicine

## 2021-12-22 ENCOUNTER — Encounter: Payer: Self-pay | Admitting: Family Medicine

## 2021-12-22 VITALS — BP 134/78 | HR 75 | Temp 98.1°F | Resp 18 | Ht 71.0 in | Wt 296.8 lb

## 2021-12-22 DIAGNOSIS — Z0000 Encounter for general adult medical examination without abnormal findings: Secondary | ICD-10-CM

## 2021-12-22 DIAGNOSIS — E1169 Type 2 diabetes mellitus with other specified complication: Secondary | ICD-10-CM

## 2021-12-22 DIAGNOSIS — E1159 Type 2 diabetes mellitus with other circulatory complications: Secondary | ICD-10-CM | POA: Diagnosis not present

## 2021-12-22 DIAGNOSIS — I10 Essential (primary) hypertension: Secondary | ICD-10-CM | POA: Diagnosis not present

## 2021-12-22 DIAGNOSIS — E119 Type 2 diabetes mellitus without complications: Secondary | ICD-10-CM

## 2021-12-22 DIAGNOSIS — E785 Hyperlipidemia, unspecified: Secondary | ICD-10-CM | POA: Diagnosis not present

## 2021-12-22 DIAGNOSIS — I152 Hypertension secondary to endocrine disorders: Secondary | ICD-10-CM | POA: Diagnosis not present

## 2021-12-22 DIAGNOSIS — I82401 Acute embolism and thrombosis of unspecified deep veins of right lower extremity: Secondary | ICD-10-CM | POA: Diagnosis not present

## 2021-12-22 MED ORDER — OLMESARTAN MEDOXOMIL 40 MG PO TABS: ORAL_TABLET | ORAL | 1 refills | Status: DC

## 2021-12-22 NOTE — Patient Instructions (Signed)
Preventive Care 65 Years and Older, Male Preventive care refers to lifestyle choices and visits with your health care provider that can promote health and wellness. Preventive care visits are also called wellness exams. What can I expect for my preventive care visit? Counseling During your preventive care visit, your health care provider may ask about your: Medical history, including: Past medical problems. Family medical history. History of falls. Current health, including: Emotional well-being. Home life and relationship well-being. Sexual activity. Memory and ability to understand (cognition). Lifestyle, including: Alcohol, nicotine or tobacco, and drug use. Access to firearms. Diet, exercise, and sleep habits. Work and work environment. Sunscreen use. Safety issues such as seatbelt and bike helmet use. Physical exam Your health care provider will check your: Height and weight. These may be used to calculate your BMI (body mass index). BMI is a measurement that tells if you are at a healthy weight. Waist circumference. This measures the distance around your waistline. This measurement also tells if you are at a healthy weight and may help predict your risk of certain diseases, such as type 2 diabetes and high blood pressure. Heart rate and blood pressure. Body temperature. Skin for abnormal spots. What immunizations do I need?  Vaccines are usually given at various ages, according to a schedule. Your health care provider will recommend vaccines for you based on your age, medical history, and lifestyle or other factors, such as travel or where you work. What tests do I need? Screening Your health care provider may recommend screening tests for certain conditions. This may include: Lipid and cholesterol levels. Diabetes screening. This is done by checking your blood sugar (glucose) after you have not eaten for a while (fasting). Hepatitis C test. Hepatitis B test. HIV (human  immunodeficiency virus) test. STI (sexually transmitted infection) testing, if you are at risk. Lung cancer screening. Colorectal cancer screening. Prostate cancer screening. Abdominal aortic aneurysm (AAA) screening. You may need this if you are a current or former smoker. Talk with your health care provider about your test results, treatment options, and if necessary, the need for more tests. Follow these instructions at home: Eating and drinking  Eat a diet that includes fresh fruits and vegetables, whole grains, lean protein, and low-fat dairy products. Limit your intake of foods with high amounts of sugar, saturated fats, and salt. Take vitamin and mineral supplements as recommended by your health care provider. Do not drink alcohol if your health care provider tells you not to drink. If you drink alcohol: Limit how much you have to 0-2 drinks a day. Know how much alcohol is in your drink. In the [place] one drink equals one 12 oz bottle of beer (355 mL), one 5 oz glass of wine (148 mL), or one 1 oz glass of hard liquor (44 mL). Lifestyle Brush your teeth every morning and night with fluoride toothpaste. Floss one time each day. Exercise for at least 30 minutes 5 or more days each week. Do not use any products that contain nicotine or tobacco. These products include cigarettes, chewing tobacco, and vaping devices, such as e-cigarettes. If you need help quitting, ask your health care provider. Do not use drugs. If you are sexually active, practice safe sex. Use a condom or other form of protection to prevent STIs. Take aspirin only as told by your health care provider. Make sure that you understand how much to take and what form to take. Work with your health care provider to find out whether it is safe   and beneficial for you to take aspirin daily. Ask your health care provider if you need to take a cholesterol-lowering medicine (statin). Find healthy ways to manage stress, such  as: Meditation, yoga, or listening to music. Journaling. Talking to a trusted person. Spending time with friends and family. Safety Always wear your seat belt while driving or riding in a vehicle. Do not drive: If you have been drinking alcohol. Do not ride with someone who has been drinking. When you are tired or distracted. While texting. If you have been using any mind-altering substances or drugs. Wear a helmet and other protective equipment during sports activities. If you have firearms in your house, make sure you follow all gun safety procedures. Minimize exposure to UV radiation to reduce your risk of skin cancer. What's next? Visit your health care provider once a year for an annual wellness visit. Ask your health care provider how often you should have your eyes and teeth checked. Stay up to date on all vaccines. This information is not intended to replace advice given to you by your health care provider. Make sure you discuss any questions you have with your health care provider. Document Revised: 12/29/2020 Document Reviewed: 12/29/2020 Elsevier Patient Education  2023 [employer]  

## 2021-12-22 NOTE — Assessment & Plan Note (Signed)
Encourage heart healthy diet such as MIND or DASH diet, increase exercise, avoid trans fats, simple carbohydrates and processed foods, consider a krill or fish or flaxseed oil cap daily.  °

## 2021-12-22 NOTE — Assessment & Plan Note (Signed)
Well controlled, no changes to meds. Encouraged heart healthy diet such as the DASH diet and exercise as tolerated.  °

## 2021-12-22 NOTE — Progress Notes (Signed)
Subjective:   By signing my name below, I, [name], attest that this documentation has been prepared under the direction and in the presence of Ann Held, DO  12/22/2021    Patient ID: Daniel Powers, male    DOB: February 11, 1956, 66 y.o.   MRN: [id number]  Chief Complaint  Patient presents with   Annual Exam    Pt states fasting     HPI Patient is in today for a comprehensive physical exam.   He continues seeing his urologist regularly and has an [place] appointment this year.  His a1c is under control at this time. He is controlling his blood sugar through diet and exercise.  Lab Results  Component Value Date   HGBA1C 5.7 (H) 10/03/2021   His cholesterol levels are good. He continues taking 10 mg Lipitor daily PO and reports no new issues while taking it.  Lab Results  Component Value Date   CHOL 142 10/03/2021   HDL 44 10/03/2021   LDLCALC 85 10/03/2021   TRIG 65 10/03/2021   CHOLHDL 3.2 10/03/2021   His blood pressure is doing well during this visit. He continues taking 5 mg amlodipine daily PO, 40 mg Benicar daily PO and reports no new issues while taking it. He is requesting a refill on 40 mg Benicar.  BP Readings from Last 3 Encounters:  12/22/21 134/78  12/14/21 133/77  10/03/21 134/67   Pulse Readings from Last 3 Encounters:  12/22/21 75  12/14/21 77  10/03/21 66   He denies having any fever, new moles, congestion, sinus pain, sore throat, chest pain, palpitations, cough, shortness of breath, wheezing, nausea, vomiting, diarrhea, constipation, dysuria, frequency, abdominal pain, hematuria, new muscle pain, new joint pain, headaches He reports his brother had a heart attack then suffered a stroke. He is doing well at his time. His niece passed away at age 44 from uterine cancer this year. Otherwise he has no change in family medical history.  He is due for his tetanus vaccine and is willing to receive it at his pharmacy. He reports receiving 5  Covid-19 vaccines including the bivalent booster vaccine.  He continues seeing a healthy weight and wellness clinic. He reports losing 40 lb's since started. He participates in regular exercise by walking.  Wt Readings from Last 3 Encounters:  12/22/21 296 lb 12.8 oz (134.6 kg)  12/14/21 262 lb (118.8 kg)  10/03/21 286 lb (129.7 kg)   He is UTD on dental and vision care.    Past Medical History:  Diagnosis Date   Anxiety    Arthritis    Asthma    COPD (chronic obstructive pulmonary disease) (HCC)    DVT (deep venous thrombosis) (HCC)    Gout    High cholesterol    Hypertension    Influenza with pneumonia    Lactose intolerance    Obesity    Pre-diabetes    Psychosexual dysfunction with inhibited sexual excitement     Past Surgical History:  Procedure Laterality Date   HAND SURGERY     right   TONSILLECTOMY AND ADENOIDECTOMY      Family History  Problem Relation Age of Onset   Hypertension Mother    Alzheimer's disease Mother    Hypertension Father    Alcohol abuse Father    Obesity Father    Cancer Sister        "male organs"   Stroke Brother    Heart disease Brother 47  MI   Hypertension Maternal Grandmother    Alzheimer's disease Maternal Grandmother    Hypertension Maternal Grandfather    Hypertension Paternal Grandmother    Hypertension Paternal Grandfather    Lung cancer Maternal Uncle    Esophageal cancer Maternal Uncle    Alzheimer's disease Paternal Aunt    Cancer Niece    Colon cancer Neg Hx     Social History   Socioeconomic History   Marital status: Married    Spouse name: Clinical cytogeneticist   Number of children: 1   Years of education: Not on file   Highest education level: Not on file  Occupational History   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    Employer: SELF  Tobacco Use   Smoking status: Never   Smokeless tobacco: Never  Substance and Sexual Activity   Alcohol use: Yes    Comment: ocass- once or twice a month   Drug use: No    Sexual activity: Yes    Partners: Female  Other Topics Concern   Not on file  Social History Narrative   Exercise --a little-- walking    Social Determinants of Health   Financial Resource Strain: Low Risk  (06/06/2021)   Overall Financial Resource Strain (CARDIA)    Difficulty of Paying Living Expenses: Not hard at all  Food Insecurity: No Food Insecurity (06/06/2021)   Hunger Vital Sign    Worried About Running Out of Food in the Last Year: Never true    [place] in the Last Year: Never true  Transportation Needs: No Transportation Needs (06/06/2021)   PRAPARE - Hydrologist (Medical): No    Lack of Transportation (Non-Medical): No  Physical Activity: Inactive (06/06/2021)   Exercise Vital Sign    Days of Exercise per Week: 0 days    Minutes of Exercise per Session: 0 min  Stress: No Stress Concern Present (06/06/2021)   [place]    Feeling of Stress : Not at all  Social Connections: [place] (06/06/2021)   Social Connection and Isolation Panel [NHANES]    Frequency of Communication with Friends and Family: More than three times a week    Frequency of Social Gatherings with Friends and Family: More than three times a week    Attends Religious Services: More than 4 times per year    Active Member of [employer] or Organizations: Yes    Attends Music therapist: More than 4 times per year    Marital Status: Married  Human resources officer Violence: Not At Risk (06/06/2021)   Humiliation, Afraid, Rape, and Kick questionnaire    Fear of Current or Ex-Partner: No    Emotionally Abused: No    Physically Abused: No    Sexually Abused: No    Outpatient Medications Prior to Visit  Medication Sig Dispense Refill   albuterol (PROAIR HFA) 108 (90 Base) MCG/ACT inhaler Inhale 2 puffs into the lungs every 6 (six) hours as needed for wheezing. 1 Inhaler 2   amLODipine  (NORVASC) 5 MG tablet TAKE 1 TABLET(5 MG) BY MOUTH DAILY 90 tablet 1   atorvastatin (LIPITOR) 10 MG tablet TAKE 1 TABLET(10 MG) BY MOUTH DAILY AT 6 PM 90 tablet 1   cetirizine (ZYRTEC) 10 MG tablet Take 10 mg by mouth daily.     Cholecalciferol (VITAMIN D3) 50 MCG (2000 UT) capsule Take 2 capsules (4,000 Units total) by mouth daily. 30 capsule 0  CINNAMON PO Take 1 tablet by mouth daily.     ELIQUIS 5 MG TABS tablet TAKE 1 TABLET(5 MG) BY MOUTH TWICE DAILY 60 tablet 5   fish oil-omega-3 fatty acids 1000 MG capsule Take 1 g by mouth daily.     Flaxseed, Linseed, 1000 MG CAPS Take 1 capsule by mouth daily.     fluticasone (FLONASE) 50 MCG/ACT nasal spray Place 2 sprays into both nostrils daily. 16 g 6   tadalafil (CIALIS) 20 MG tablet Take 20 mg by mouth daily as needed.     benzonatate (TESSALON) 100 MG capsule Take 1 capsule (100 mg total) by mouth 2 (two) times daily as needed for cough. 20 capsule 0   olmesartan (BENICAR) 40 MG tablet TAKE 1 TABLET(40 MG) BY MOUTH DAILY 90 tablet 1   promethazine-dextromethorphan (PROMETHAZINE-DM) 6.25-15 MG/5ML syrup Take 5 mLs by mouth 2 (two) times daily as needed for cough. 118 mL 0   COVID-19 mRNA bivalent vaccine, Pfizer, (PFIZER COVID-19 VAC BIVALENT) injection Inject into the muscle. (Patient not taking: Reported on 12/22/2021) 0.3 mL 0   COVID-19 mRNA Vac-TriS, Pfizer, (PFIZER-BIONT COVID-19 VAC-TRIS) SUSP injection Inject into the muscle. (Patient not taking: Reported on 12/22/2021) 0.3 mL 0   No facility-administered medications prior to visit.    Allergies  Allergen Reactions   Penicillins     REACTION: Penicillin    Review of Systems  Constitutional:  Negative for fever.  HENT:  Negative for congestion, sinus pain and sore throat.   Respiratory:  Negative for cough, shortness of breath and wheezing.   Cardiovascular:  Negative for chest pain and palpitations.  Gastrointestinal:  Negative for abdominal pain, constipation, diarrhea, nausea and  vomiting.  Genitourinary:  Negative for dysuria, frequency and hematuria.  Musculoskeletal:        (-)new muscle pain (-)new joint pain  Skin:        (-)New moles  Neurological:  Negative for headaches.       Objective:    Physical Exam Vitals and nursing note reviewed.  Constitutional:      General: He is not in acute distress.    Appearance: Normal appearance. He is not ill-appearing.  HENT:     Head: Normocephalic and atraumatic.     Right Ear: Tympanic membrane, ear canal and external ear normal.     Left Ear: Tympanic membrane, ear canal and external ear normal.  Eyes:     Extraocular Movements: Extraocular movements intact.     Pupils: Pupils are equal, round, and reactive to light.  Cardiovascular:     Rate and Rhythm: Normal rate and regular rhythm.     Heart sounds: Normal heart sounds. No murmur heard.    No gallop.  Pulmonary:     Effort: Pulmonary effort is normal. No respiratory distress.     Breath sounds: Normal breath sounds. No wheezing or rales.  Abdominal:     General: Bowel sounds are normal. There is no distension.     Palpations: Abdomen is soft.     Tenderness: There is no abdominal tenderness. There is no guarding.  Skin:    General: Skin is warm and dry.  Neurological:     Mental Status: He is alert and oriented to person, place, and time.  Psychiatric:        Judgment: Judgment normal.     BP 134/78 (BP Location: Left Arm, Patient Position: Sitting, Cuff Size: Large)   Pulse 75   Temp 98.1 F (36.7 C) (Oral)  Resp 18   Ht _0  (1.803 m)   Wt 296 lb 12.8 oz (134.6 kg)   SpO2 97%   BMI 41.40 kg/m  Wt Readings from Last 3 Encounters:  12/22/21 296 lb 12.8 oz (134.6 kg)  12/14/21 262 lb (118.8 kg)  10/03/21 286 lb (129.7 kg)    Diabetic Foot Exam - Simple   No data filed    Lab Results  Component Value Date   WBC 6.2 10/03/2021   HGB 14.5 10/03/2021   HCT 43.8 10/03/2021   PLT 191 10/03/2021   GLUCOSE 97 10/03/2021    CHOL 142 10/03/2021   TRIG 65 10/03/2021   HDL 44 10/03/2021   LDLCALC 85 10/03/2021   ALT 12 10/03/2021   AST 14 10/03/2021   NA 139 10/03/2021   K 3.8 10/03/2021   CL 104 10/03/2021   CREATININE 0.89 10/03/2021   BUN 16 10/03/2021   CO2 21 10/03/2021   TSH 0.920 12/25/2018   PSA 1.31 06/28/2017   INR 1.8 (A) 09/16/2019   HGBA1C 5.7 (H) 10/03/2021   MICROALBUR 0.7 12/06/2020    Lab Results  Component Value Date   TSH 0.920 12/25/2018   Lab Results  Component Value Date   WBC 6.2 10/03/2021   HGB 14.5 10/03/2021   HCT 43.8 10/03/2021   MCV 86 10/03/2021   PLT 191 10/03/2021   Lab Results  Component Value Date   NA 139 10/03/2021   K 3.8 10/03/2021   CO2 21 10/03/2021   GLUCOSE 97 10/03/2021   BUN 16 10/03/2021   CREATININE 0.89 10/03/2021   BILITOT 0.9 10/03/2021   ALKPHOS 48 10/03/2021   AST 14 10/03/2021   ALT 12 10/03/2021   PROT 6.6 10/03/2021   ALBUMIN 4.1 10/03/2021   CALCIUM 9.1 10/03/2021   EGFR 94 10/03/2021   GFR 89.68 09/16/2019   Lab Results  Component Value Date   CHOL 142 10/03/2021   Lab Results  Component Value Date   HDL 44 10/03/2021   Lab Results  Component Value Date   LDLCALC 85 10/03/2021   Lab Results  Component Value Date   TRIG 65 10/03/2021   Lab Results  Component Value Date   CHOLHDL 3.2 10/03/2021   Lab Results  Component Value Date   HGBA1C 5.7 (H) 10/03/2021   Colonoscopy- Last completed 05/01/2017. - One 3 mm polyp in the cecum, removed with a cold snare. Resected and retrieved. - One 3 mm polyp in the descending colon, removed with a cold snare. Resected and retrieved. - Internal hemorrhoids. - The examination was otherwise normal.     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 Encouraged heart healthy diet such as the DASH diet and exercise as tolerated.       Relevant Medications   olmesartan (BENICAR) 40 MG tablet   Hyperlipidemia associated with type 2 diabetes mellitus ([place])    Encourage heart healthy diet such as MIND or DASH diet, increase exercise, avoid trans  fats, simple carbohydrates and processed foods, consider a krill or fish or flaxseed oil cap daily.       Relevant Medications   olmesartan (BENICAR) 40 MG tablet   Hyperlipidemia    Tolerating statin, encouraged heart healthy diet, avoid trans fats, minimize simple carbs and saturated fats. Increase exercise as tolerated      Relevant Medications   olmesartan (BENICAR) 40 MG tablet   Diet-controlled diabetes mellitus ([place])    Check labs today hgba1c to be checked , minimize simple carbs. Increase exercise as tolerated. Continue current meds       Relevant Medications   olmesartan (BENICAR) 40 MG tablet   Other Visit Diagnoses     Essential hypertension       Relevant Medications   olmesartan (BENICAR) 40 MG tablet        Meds ordered this encounter  Medications   olmesartan (BENICAR) 40 MG tablet    Sig: TAKE 1 TABLET(40 MG) BY MOUTH DAILY    Dispense:  90 tablet    Refill:  1    I, Ann Held, DO, personally preformed the services described in this documentation.  All medical record entries made by the scribe were at my direction and in my presence.  I have reviewed the chart and discharge instructions (if applicable) and agree that the record reflects my personal performance and is accurate and complete. 12/22/2021   I,[name],acting as a scribe for Ann Held, DO.,have documented all relevant documentation on the behalf of Ann Held, DO,as directed  by  Ann Held, DO while in the presence of Ann Held, DO.   Ann Held, DO

## 2021-12-22 NOTE — Assessment & Plan Note (Signed)
Tolerating statin, encouraged heart healthy diet, avoid trans fats, minimize simple carbs and saturated fats. Increase exercise as tolerated 

## 2021-12-22 NOTE — Assessment & Plan Note (Signed)
Check labs today hgba1c to be checked , minimize simple carbs. Increase exercise as tolerated. Continue current meds  

## 2021-12-22 NOTE — Assessment & Plan Note (Signed)
con't healthy weight and wellness 

## 2021-12-22 NOTE — Assessment & Plan Note (Signed)
con't anticoagulations

## 2022-01-12 ENCOUNTER — Encounter (INDEPENDENT_AMBULATORY_CARE_PROVIDER_SITE_OTHER): Payer: Self-pay | Admitting: Family Medicine

## 2022-01-12 ENCOUNTER — Ambulatory Visit (INDEPENDENT_AMBULATORY_CARE_PROVIDER_SITE_OTHER): Payer: Medicare HMO | Admitting: Family Medicine

## 2022-01-12 VITALS — BP 130/79 | HR 85 | Temp 98.1°F | Ht 71.0 in | Wt 294.0 lb

## 2022-01-12 DIAGNOSIS — E1159 Type 2 diabetes mellitus with other circulatory complications: Secondary | ICD-10-CM | POA: Diagnosis not present

## 2022-01-12 DIAGNOSIS — E1169 Type 2 diabetes mellitus with other specified complication: Secondary | ICD-10-CM | POA: Diagnosis not present

## 2022-01-12 DIAGNOSIS — E669 Obesity, unspecified: Secondary | ICD-10-CM | POA: Diagnosis not present

## 2022-01-12 DIAGNOSIS — Z6841 Body Mass Index (BMI) 40.0 and over, adult: Secondary | ICD-10-CM | POA: Diagnosis not present

## 2022-01-12 DIAGNOSIS — I152 Hypertension secondary to endocrine disorders: Secondary | ICD-10-CM | POA: Diagnosis not present

## 2022-01-16 NOTE — Progress Notes (Signed)
Chief Complaint:   OBESITY Daniel Powers is here to discuss his progress with his obesity treatment plan along with follow-up of his obesity related diagnoses. Daniel Powers is on the Category 3 Plan and states he is following his eating plan approximately 50% of the time. Daniel Powers states he is  walking 60 minutes 1 times per week.  Today's visit was #: 42 lbs Starting weight: 309 lbs Starting date: 12/24/2020 Today's weight: 294 lbs Today's date: 01/12/2022 Total lbs lost to date: 15 lbs Total lbs lost since last in-office visit: +2 lbs  Interim History: Daniel Powers has been following Category 3 plan 50% of the them.  Rarely follows the lunch on plan.  Eats out for lunch daily, cracker barrel, red lobster.   Subjective:   1. Type 2 diabetes mellitus with other specified complication, without long-term current use of insulin ([place]), diet controlled. Medications reviewed. Diabetic ROS: no polyuria or polydipsia, no chest pain, dyspnea or TIA's, no numbness, tingling or pain in extremities. Daniel Powers is not checking blood sugars, he is not taking any medications. No symptoms or concerns.  2. Hypertension associated with type 2 diabetes mellitus ([place]) Review: taking medications as instructed, no medication side effects noted, no chest pain on exertion, no dyspnea on exertion, no swelling of ankles.  He is taking Benicar and Norvasc.  Assessment/Plan:  No orders of the defined types were placed in this encounter.   There are no discontinued medications.   No orders of the defined types were placed in this encounter.    1. Type 2 diabetes mellitus with other specified complication, without long-term current use of insulin (Daniel Powers) Daniel Powers declines medications today to help with weight loss.  A1c was under great control 3 months ago at 5.7.  Daniel Powers would like to take less medications not more.   2. Hypertension associated with type 2 diabetes mellitus ([place]) Daniel Powers is working on healthy weight loss and  exercise to improve blood pressure control. We will watch for signs of hypotension as he continues his lifestyle modifications.  Blood pressure is at goal.   3. Obesity with current BMI of 41. Gave patient new handouts:   Daniel Powers is currently in the action stage of change. As such, his goal is to continue with weight loss efforts. He has agreed to the Category 3 Plan.   Exercise goals:  As is.  Behavioral modification strategies: increasing lean protein intake and decreasing eating out.  Daniel Powers has agreed to follow-up with our clinic in 4 weeks. He was informed of the importance of frequent follow-up visits to maximize his success with intensive lifestyle modifications for his multiple health conditions.    Objective:   Blood pressure 130/79, pulse 85, temperature 98.1 F (36.7 C), height '5\' 11"'$  (1.803 m), weight 294 lb (133.4 kg), SpO2 99 %. Body mass index is 41 kg/m.  General: Cooperative, alert, well developed, in no acute distress. HEENT: Conjunctivae and lids unremarkable. Cardiovascular: Regular rhythm.  Lungs: Normal work of breathing. Neurologic: No focal deficits.   Lab Results  Component Value Date   CREATININE 0.89 10/03/2021   BUN 16 10/03/2021   NA 139 10/03/2021   K 3.8 10/03/2021   CL 104 10/03/2021   CO2 21 10/03/2021   Lab Results  Component Value Date   ALT 12 10/03/2021   AST 14 10/03/2021   ALKPHOS 48 10/03/2021   BILITOT 0.9 10/03/2021   Lab Results  Component Value Date   HGBA1C 5.7 (H) 10/03/2021   HGBA1C 5.8 (  H) 04/18/2021   HGBA1C 5.7 (H) 09/30/2020   HGBA1C 5.9 (H) 05/31/2020   HGBA1C 5.8 (H) 02/04/2020   Lab Results  Component Value Date   INSULIN 11.0 10/03/2021   INSULIN 7.6 09/30/2020   INSULIN 11.1 05/31/2020   INSULIN 6.7 02/04/2020   INSULIN 9.5 12/25/2018   Lab Results  Component Value Date   TSH 0.920 12/25/2018   Lab Results  Component Value Date   CHOL 142 10/03/2021   HDL 44 10/03/2021   LDLCALC 85 10/03/2021    TRIG 65 10/03/2021   CHOLHDL 3.2 10/03/2021   Lab Results  Component Value Date   VD25OH 69.7 10/03/2021   VD25OH 72.7 04/18/2021   VD25OH 58.8 09/30/2020   Lab Results  Component Value Date   WBC 6.2 10/03/2021   HGB 14.5 10/03/2021   HCT 43.8 10/03/2021   MCV 86 10/03/2021   PLT 191 10/03/2021   No results found for: "IRON", "TIBC", "FERRITIN"  Obesity Behavioral Intervention:   Approximately 15 minutes were spent on the discussion below.  ASK: We discussed the diagnosis of obesity with Daniel Powers today and Daniel Powers agreed to give [name] permission to discuss obesity behavioral modification therapy today.  ASSESS: Daniel Powers has the diagnosis of obesity and his BMI today is 41.0. Daniel Powers is in the action stage of change.   ADVISE: Daniel Powers was educated on the multiple health risks of obesity as well as the benefit of weight loss to improve his health. He was advised of the need for long term treatment and the importance of lifestyle modifications to improve his current health and to decrease his risk of future health problems.  AGREE: Multiple dietary modification options and treatment options were discussed and Daniel Powers agreed to follow the recommendations documented in the above note.  ARRANGE: Daniel Powers was educated on the importance of frequent visits to treat obesity as outlined per CMS and USPSTF guidelines and agreed to schedule his next follow up appointment today.  Attestation Statements:   Reviewed by clinician on day of visit: allergies, medications, problem list, medical history, surgical history, family history, social history, and previous encounter notes.  I, Davy Pique, RMA, am acting as Location manager for [employer], DO.  I have reviewed the above documentation for accuracy and completeness, and I agree with the above. -   I have reviewed the above documentation for accuracy and completeness, and I agree with the above. Marjory Sneddon, D.O.  The [place] was signed into law in 2016 which includes the topic of electronic health records.  This provides immediate access to information in MyChart.  This includes consultation notes, operative notes, office notes, lab results and pathology reports.  If you have any questions about what you read please let us know at your next visit so we can discuss your concerns and take corrective action if need be.  We are right here with you.

## 2022-02-01 ENCOUNTER — Other Ambulatory Visit: Payer: Self-pay | Admitting: Family Medicine

## 2022-02-01 DIAGNOSIS — I10 Essential (primary) hypertension: Secondary | ICD-10-CM

## 2022-02-16 ENCOUNTER — Ambulatory Visit (INDEPENDENT_AMBULATORY_CARE_PROVIDER_SITE_OTHER): Payer: Medicare HMO | Admitting: Family Medicine

## 2022-02-22 ENCOUNTER — Encounter (INDEPENDENT_AMBULATORY_CARE_PROVIDER_SITE_OTHER): Payer: Self-pay

## 2022-02-27 ENCOUNTER — Other Ambulatory Visit: Payer: Self-pay | Admitting: Family Medicine

## 2022-02-27 DIAGNOSIS — E785 Hyperlipidemia, unspecified: Secondary | ICD-10-CM

## 2022-03-13 ENCOUNTER — Encounter (INDEPENDENT_AMBULATORY_CARE_PROVIDER_SITE_OTHER): Payer: Self-pay | Admitting: Family Medicine

## 2022-03-13 ENCOUNTER — Ambulatory Visit (INDEPENDENT_AMBULATORY_CARE_PROVIDER_SITE_OTHER): Payer: Medicare HMO | Admitting: Family Medicine

## 2022-03-13 VITALS — BP 126/74 | HR 79 | Temp 98.3°F | Ht 71.0 in | Wt 290.0 lb

## 2022-03-13 DIAGNOSIS — E669 Obesity, unspecified: Secondary | ICD-10-CM | POA: Diagnosis not present

## 2022-03-13 DIAGNOSIS — I10 Essential (primary) hypertension: Secondary | ICD-10-CM | POA: Diagnosis not present

## 2022-03-13 DIAGNOSIS — Z6841 Body Mass Index (BMI) 40.0 and over, adult: Secondary | ICD-10-CM | POA: Diagnosis not present

## 2022-03-13 DIAGNOSIS — E559 Vitamin D deficiency, unspecified: Secondary | ICD-10-CM | POA: Diagnosis not present

## 2022-03-20 NOTE — Progress Notes (Signed)
Chief Complaint:   OBESITY Daniel Powers is here to discuss his progress with his obesity treatment plan along with follow-up of his obesity related diagnoses. Daniel Powers is on the Category 3 Plan and states he is following his eating plan approximately 50% of the time. Daniel Powers states he is walking 60 minutes 2 times per week.  Today's visit was #: 17 Starting weight: 309 lbs Starting date: 12/24/2020 Today's weight: 290 lbs Today's date: 03/13/2022 Total lbs lost to date: 19 Total lbs lost since last in-office visit: 4  Interim History: Daniel Powers was last seen 01/12/2022. He and his wife eat out most of the time at [employer], [employer], [employer]. And [employer].  Subjective:   1. Essential hypertension Asymptomatic and without concerns. Medication: Norvasc and Benicar  2. Vitamin D deficiency Daniel Powers's last Vit D check was 5 months ago. He takes OTC Vit D 4,000 IU daily without issues.  Assessment/Plan:  No orders of the defined types were placed in this encounter.   There are no discontinued medications.   No orders of the defined types were placed in this encounter.    1. Essential hypertension BP at goal. Daniel Powers is working on healthy weight loss and exercise to improve blood pressure control. We will watch for signs of hypotension as he continues his lifestyle modifications.  2. Vitamin D deficiency Low Vitamin D level contributes to fatigue and are associated with obesity, breast, and colon cancer. He agrees to continue OTC Vitamin D 4,000 IU daily and will follow-up for routine testing of Vitamin D, at least 2-3 times per year to avoid over-replacement. Recheck Vit D level at next OV. Counseling done on the importance of increasing weight bearing exercises.  3. Obesity with current BMI of 41.5 Daniel Powers is currently in the action stage of change. As such, his goal is to continue with weight loss efforts. He has agreed to the Category 3 Plan.   Eating Out Guide given and discussed  with pt 10:1 ratio items and how to look them up.   Exercise goals:  Increase walking as tolerated to 3 days a week.  Behavioral modification strategies: decreasing eating out, meal planning and cooking strategies, and planning for success.  Daniel Powers has agreed to follow-up with our clinic in 4 weeks , fasting for blood work and repeat IC. He was informed of the importance of frequent follow-up visits to maximize his success with intensive lifestyle modifications for his multiple health conditions.   Objective:   Blood pressure 126/74, pulse 79, temperature 98.3 F (36.8 C), height '5\' 11"'$  (1.803 m), weight 290 lb (131.5 kg), SpO2 99 %. Body mass index is 40.45 kg/m.  General: Cooperative, alert, well developed, in no acute distress. HEENT: Conjunctivae and lids unremarkable. Cardiovascular: Regular rhythm.  Lungs: Normal work of breathing. Neurologic: No focal deficits.   Lab Results  Component Value Date   CREATININE 0.89 10/03/2021   BUN 16 10/03/2021   NA 139 10/03/2021   K 3.8 10/03/2021   CL 104 10/03/2021   CO2 21 10/03/2021   Lab Results  Component Value Date   ALT 12 10/03/2021   AST 14 10/03/2021   ALKPHOS 48 10/03/2021   BILITOT 0.9 10/03/2021   Lab Results  Component Value Date   HGBA1C 5.7 (H) 10/03/2021   HGBA1C 5.8 (H) 04/18/2021   HGBA1C 5.7 (H) 09/30/2020   HGBA1C 5.9 (H) 05/31/2020   HGBA1C 5.8 (H) 02/04/2020   Lab Results  Component Value Date   INSULIN  11.0 10/03/2021   INSULIN 7.6 09/30/2020   INSULIN 11.1 05/31/2020   INSULIN 6.7 02/04/2020   INSULIN 9.5 12/25/2018   Lab Results  Component Value Date   TSH 0.920 12/25/2018   Lab Results  Component Value Date   CHOL 142 10/03/2021   HDL 44 10/03/2021   LDLCALC 85 10/03/2021   TRIG 65 10/03/2021   CHOLHDL 3.2 10/03/2021   Lab Results  Component Value Date   VD25OH 69.7 10/03/2021   VD25OH 72.7 04/18/2021   VD25OH 58.8 09/30/2020   Lab Results  Component Value Date   WBC 6.2  10/03/2021   HGB 14.5 10/03/2021   HCT 43.8 10/03/2021   MCV 86 10/03/2021   PLT 191 10/03/2021   No results found for: "IRON", "TIBC", "FERRITIN"  Obesity Behavioral Intervention:   Approximately 15 minutes were spent on the discussion below.  ASK: We discussed the diagnosis of obesity with Daniel Powers today and Daniel Powers agreed to give [name] permission to discuss obesity behavioral modification therapy today.  ASSESS: Daniel Powers has the diagnosis of obesity and his BMI today is 40.5. Daniel Powers is in the action stage of change.   ADVISE: Daniel Powers was educated on the multiple health risks of obesity as well as the benefit of weight loss to improve his health. He was advised of the need for long term treatment and the importance of lifestyle modifications to improve his current health and to decrease his risk of future health problems.  AGREE: Multiple dietary modification options and treatment options were discussed and Daniel Powers agreed to follow the recommendations documented in the above note.  ARRANGE: Daniel Powers was educated on the importance of frequent visits to treat obesity as outlined per CMS and USPSTF guidelines and agreed to schedule his next follow up appointment today.  Attestation Statements:   Reviewed by clinician on day of visit: allergies, medications, problem list, medical history, surgical history, family history, social history, and previous encounter notes.  I, Kathlene November, BS, CMA, am acting as transcriptionist for [employer], DO.   I have reviewed the above documentation for accuracy and completeness, and I agree with the above. Marjory Sneddon, D.O.  The [place] was signed into law in 2016 which includes the topic of electronic health records.  This provides immediate access to information in MyChart.  This includes consultation notes, operative notes, office notes, lab results and pathology reports.  If you have any questions about what you read please  let us know at your next visit so we can discuss your concerns and take corrective action if need be.  We are right here with you.

## 2022-04-12 ENCOUNTER — Ambulatory Visit (INDEPENDENT_AMBULATORY_CARE_PROVIDER_SITE_OTHER): Payer: Medicare HMO | Admitting: Family Medicine

## 2022-04-12 ENCOUNTER — Encounter (INDEPENDENT_AMBULATORY_CARE_PROVIDER_SITE_OTHER): Payer: Self-pay | Admitting: Family Medicine

## 2022-04-12 VITALS — BP 121/75 | HR 65 | Temp 98.1°F | Ht 71.0 in | Wt 292.0 lb

## 2022-04-12 DIAGNOSIS — E1159 Type 2 diabetes mellitus with other circulatory complications: Secondary | ICD-10-CM

## 2022-04-12 DIAGNOSIS — E538 Deficiency of other specified B group vitamins: Secondary | ICD-10-CM | POA: Diagnosis not present

## 2022-04-12 DIAGNOSIS — E785 Hyperlipidemia, unspecified: Secondary | ICD-10-CM | POA: Diagnosis not present

## 2022-04-12 DIAGNOSIS — R0602 Shortness of breath: Secondary | ICD-10-CM | POA: Diagnosis not present

## 2022-04-12 DIAGNOSIS — E669 Obesity, unspecified: Secondary | ICD-10-CM

## 2022-04-12 DIAGNOSIS — E1169 Type 2 diabetes mellitus with other specified complication: Secondary | ICD-10-CM | POA: Diagnosis not present

## 2022-04-12 DIAGNOSIS — I152 Hypertension secondary to endocrine disorders: Secondary | ICD-10-CM

## 2022-04-12 DIAGNOSIS — E119 Type 2 diabetes mellitus without complications: Secondary | ICD-10-CM | POA: Insufficient documentation

## 2022-04-12 DIAGNOSIS — E559 Vitamin D deficiency, unspecified: Secondary | ICD-10-CM

## 2022-04-12 DIAGNOSIS — Z6841 Body Mass Index (BMI) 40.0 and over, adult: Secondary | ICD-10-CM

## 2022-04-14 LAB — COMPREHENSIVE METABOLIC PANEL
ALT: 12 IU/L (ref 0–44)
AST: 13 IU/L (ref 0–40)
Albumin/Globulin Ratio: 1.6 (ref 1.2–2.2)
Albumin: 4.2 g/dL (ref 3.9–4.9)
Alkaline Phosphatase: 47 IU/L (ref 44–121)
BUN/Creatinine Ratio: 22 (ref 10–24)
BUN: 17 mg/dL (ref 8–27)
Bilirubin Total: 0.8 mg/dL (ref 0.0–1.2)
CO2: 22 mmol/L (ref 20–29)
Calcium: 9.3 mg/dL (ref 8.6–10.2)
Chloride: 104 mmol/L (ref 96–106)
Creatinine, Ser: 0.77 mg/dL (ref 0.76–1.27)
Globulin, Total: 2.7 g/dL (ref 1.5–4.5)
Glucose: 85 mg/dL (ref 70–99)
Potassium: 4.1 mmol/L (ref 3.5–5.2)
Sodium: 140 mmol/L (ref 134–144)
Total Protein: 6.9 g/dL (ref 6.0–8.5)
eGFR: 98 mL/min/{1.73_m2} (ref >59)

## 2022-04-14 LAB — LIPID PANEL WITH LDL/HDL RATIO
Cholesterol, Total: 128 mg/dL (ref 100–199)
HDL: 45 mg/dL (ref >39)
LDL Chol Calc (NIH): 70 mg/dL (ref 0–99)
LDL/HDL Ratio: 1.6 ratio (ref 0.0–3.6)
Triglycerides: 58 mg/dL (ref 0–149)
VLDL Cholesterol Cal: 13 mg/dL (ref 5–40)

## 2022-04-14 LAB — HEMOGLOBIN A1C
Est. average glucose Bld gHb Est-mCnc: 117 mg/dL
Hgb A1c MFr Bld: 5.7 % — ABNORMAL HIGH (ref 4.8–5.6)

## 2022-04-14 LAB — VITAMIN D 25 HYDROXY (VIT D DEFICIENCY, FRACTURES): Vit D, 25-Hydroxy: 75 ng/mL (ref 30.0–100.0)

## 2022-04-14 LAB — INSULIN, RANDOM: INSULIN: 9.6 u[IU]/mL (ref 2.6–24.9)

## 2022-04-14 LAB — TSH: TSH: 0.864 u[IU]/mL (ref 0.450–4.500)

## 2022-04-14 LAB — VITAMIN B12: Vitamin B-12: 361 pg/mL (ref 232–1245)

## 2022-04-14 LAB — T4, FREE: Free T4: 1.22 ng/dL (ref 0.82–1.77)

## 2022-04-18 NOTE — Progress Notes (Signed)
Chief Complaint:   OBESITY Daniel Powers is here to discuss his progress with his obesity treatment plan along with follow-up of his obesity related diagnoses. Daniel Powers is on the Category 3 Plan and states he is following his eating plan approximately 50% of the time. Daniel Powers states he is walking 60 minutes 2 times per week.  Today's visit was #: 41 Starting weight: 309 lbs Starting date: 6/110/2022 Today's weight: 292 lbs Today's date: 04/12/2022 Total lbs lost to date: 17 Total lbs lost since last in-office visit: +2  Interim History: Daniel Powers is only on plan 50% of the time and skips lunch often. He eats out 5+ days a week for dinner at [employer], [employer], [employer], etc. Daniel Powers has some cravings for sweets, usually after dinner.  Subjective:   1. Type 2 diabetes mellitus with obesity ([place]) [place] has sweets cravings after dinner but is not getting his proteins in. He prefers to not be on meds.  2. Hypertension associated with type 2 diabetes mellitus ([place]) [name] is tolerating medication(s) well without side effects.  Medication compliance is good as patient endorses taking it as prescribed.  The patient denies additional concerns regarding this condition. Medication: Norvasc, Benicar  3. Hyperlipidemia associated with type 2 diabetes mellitus ([facility]) Asymptomatic. Daniel Powers denies joint aches and has no issues with meds. Medications: Lipitor, Fish Oil, Flaxseed  4. B12 deficiency Daniel Powers takes OTC B12 500 mcg.  5. Vitamin D deficiency He is on OTC Vit D 2000 IU daily.  6. SOB (shortness of breath) on exertion [name] notes increasing shortness of breath with exercising and seems to be worsening over time with weight gain. He notes getting out of breath sooner with activity than he used to. This has gotten worse recently. Darcey denies shortness of breath at rest or orthopnea.  Assessment/Plan:   Orders Placed This Encounter  Procedures   VITAMIN D 25 Hydroxy (Vit-D Deficiency,  Fractures)   TSH   T4, free   Lipid Panel With LDL/HDL Ratio   Insulin, random   Hemoglobin A1c   Comprehensive metabolic panel   Vitamin V78    There are no discontinued medications.   No orders of the defined types were placed in this encounter.    1. Type 2 diabetes mellitus with obesity (Daniel Powers) Good blood sugar control is important to decrease the likelihood of diabetic complications such as nephropathy, neuropathy, limb loss, blindness, coronary artery disease, and death. Intensive lifestyle modification including diet, exercise and weight loss are the first line of treatment for diabetes.  Daniel Powers wishes to try dietary changes before starting meds to help with cravings.  Handout: Metformin  Lab/Orders today or future: - Insulin, random - Hemoglobin A1c  2. Hypertension associated with type 2 diabetes mellitus (HCC) BP well controlled. Daniel Powers is working on healthy weight loss and exercise to improve blood pressure control. We will watch for signs of hypotension as he continues his lifestyle modifications.continue meds per PCP and decrease salt intake.  Lab/Orders today or future: - Comprehensive metabolic panel  3. Hyperlipidemia associated with type 2 diabetes mellitus ([place]) Cardiovascular risk and specific lipid/LDL goals reviewed.  We discussed several lifestyle modifications today and Daniel Powers will continue to work on diet, exercise and weight loss efforts. Orders and follow up as documented in patient record.   Counseling Intensive lifestyle modifications are the first line treatment for this issue. Dietary changes: Increase soluble fiber. Decrease simple carbohydrates. Exercise changes: Moderate to vigorous-intensity aerobic activity 150 minutes per  week if tolerated. Lipid-lowering medications: see documented in medical record.  Lab/Orders today or future: - Lipid Panel With LDL/HDL Ratio  4. B12 deficiency The diagnosis was reviewed with the patient. Counseling provided  today, see below. We will continue to monitor. Orders and follow up as documented in patient record. Continue OTC B12 500 mcg supplement.  Counseling The body needs vitamin B12: to make red blood cells; to make DNA; and to help the nerves work properly so they can carry messages from the brain to the body.  The main causes of vitamin B12 deficiency include dietary deficiency, digestive diseases, pernicious anemia, and having a surgery in which part of the stomach or small intestine is removed.  Certain medicines can make it harder for the body to absorb vitamin B12. These medicines include: heartburn medications; some antibiotics; some medications used to treat diabetes, gout, and high cholesterol.  In some cases, there are no symptoms of this condition. If the condition leads to anemia or nerve damage, various symptoms can occur, such as weakness or fatigue, shortness of breath, and numbness or tingling in your hands and feet.   Treatment:  May include taking vitamin B12 supplements.  Avoid alcohol.  Eat lots of healthy foods that contain vitamin B12: Beef, pork, chicken, [place], and organ meats, such as liver.  Seafood: This includes clams, rainbow trout, salmon, tuna, and haddock. Eggs.  Cereal and dairy products that are fortified: This means that vitamin B12 has been added to the food.   Lab/Orders today or future: - Vitamin B12  5. Vitamin D deficiency Low Vitamin D level contributes to fatigue and are associated with obesity, breast, and colon cancer. He agrees to continue to take OTC Vitamin D 2,000 IU daily and will follow-up for routine testing of Vitamin D, at least 2-3 times per year to avoid over-replacement.  Lab/Orders today or future: - VITAMIN D 25 Hydroxy (Vit-D Deficiency, Fractures)  6. SOB (shortness of breath) on exertion Repeat IC went from 1579 to 1627. Since Daniel Powers is still struggling to eat all of his foods during the day, decrease from category 3 to category 2 with  lunch options. Extensive counseling done.  Lab/Orders today or future: - TSH - T4, free  7. Obesity with current BMI of 40.8 [place] is currently in the action stage of change. As such, his goal is to continue with weight loss efforts. He has agreed to change to the Category 2 Plan with lunch options.   Decrease from category 3 to category 2. Daniel Powers is bored with sandwich and prefers salads. Handout: Lunch Options  Exercise goals:  As is  Behavioral modification strategies: increasing lean protein intake, decreasing simple carbohydrates, no skipping meals, and planning for success.  Daniel Powers has agreed to follow-up with our clinic in 4 weeks. He was informed of the importance of frequent follow-up visits to maximize his success with intensive lifestyle modifications for his multiple health conditions.   Daniel Powers was informed we would discuss his lab results at his next visit unless there is a critical issue that needs to be addressed sooner. Daniel Powers agreed to keep his next visit at the agreed upon time to discuss these results.  Objective:   Blood pressure 121/75, pulse 65, temperature 98.1 F (36.7 C), height '5\' 11"'$  (1.803 m), weight 292 lb (132.5 kg), SpO2 99 %. Body mass index is 40.73 kg/m.  General: Cooperative, alert, well developed, in no acute distress. HEENT: Conjunctivae and lids unremarkable. Cardiovascular: Regular rhythm.  Lungs: Normal  work of breathing. Neurologic: No focal deficits.   Lab Results  Component Value Date   CREATININE 0.77 04/12/2022   BUN 17 04/12/2022   NA 140 04/12/2022   K 4.1 04/12/2022   CL 104 04/12/2022   CO2 22 04/12/2022   Lab Results  Component Value Date   ALT 12 04/12/2022   AST 13 04/12/2022   ALKPHOS 47 04/12/2022   BILITOT 0.8 04/12/2022   Lab Results  Component Value Date   HGBA1C 5.7 (H) 04/12/2022   HGBA1C 5.7 (H) 10/03/2021   HGBA1C 5.8 (H) 04/18/2021   HGBA1C 5.7 (H) 09/30/2020   HGBA1C 5.9 (H) 05/31/2020   Lab Results   Component Value Date   INSULIN 9.6 04/12/2022   INSULIN 11.0 10/03/2021   INSULIN 7.6 09/30/2020   INSULIN 11.1 05/31/2020   INSULIN 6.7 02/04/2020   Lab Results  Component Value Date   TSH 0.864 04/12/2022   Lab Results  Component Value Date   CHOL 128 04/12/2022   HDL 45 04/12/2022   LDLCALC 70 04/12/2022   TRIG 58 04/12/2022   CHOLHDL 3.2 10/03/2021   Lab Results  Component Value Date   VD25OH 75.0 04/12/2022   VD25OH 69.7 10/03/2021   VD25OH 72.7 04/18/2021   Lab Results  Component Value Date   WBC 6.2 10/03/2021   HGB 14.5 10/03/2021   HCT 43.8 10/03/2021   MCV 86 10/03/2021   PLT 191 10/03/2021     Attestation Statements:   Reviewed by clinician on day of visit: allergies, medications, problem list, medical history, surgical history, family history, social history, and previous encounter notes.  Time spent on visit including pre-visit chart review and post-visit care and charting was 40 minutes.   I, Kathlene November, BS, CMA, am acting as transcriptionist for [employer], DO.  I have reviewed the above documentation for accuracy and completeness, and I agree with the above. -   I have reviewed the above documentation for accuracy and completeness, and I agree with the above. Marjory Sneddon, D.O.  The [place] was signed into law in 2016 which includes the topic of electronic health records.  This provides immediate access to information in MyChart.  This includes consultation notes, operative notes, office notes, lab results and pathology reports.  If you have any questions about what you read please let us know at your next visit so we can discuss your concerns and take corrective action if need be.  We are right here with you.

## 2022-04-28 ENCOUNTER — Other Ambulatory Visit: Payer: Self-pay | Admitting: Family Medicine

## 2022-04-28 DIAGNOSIS — I82401 Acute embolism and thrombosis of unspecified deep veins of right lower extremity: Secondary | ICD-10-CM

## 2022-05-11 ENCOUNTER — Ambulatory Visit (INDEPENDENT_AMBULATORY_CARE_PROVIDER_SITE_OTHER): Payer: Medicare HMO | Admitting: Family Medicine

## 2022-05-17 DIAGNOSIS — R972 Elevated prostate specific antigen [PSA]: Secondary | ICD-10-CM | POA: Diagnosis not present

## 2022-05-24 DIAGNOSIS — R972 Elevated prostate specific antigen [PSA]: Secondary | ICD-10-CM | POA: Diagnosis not present

## 2022-05-24 DIAGNOSIS — R3915 Urgency of urination: Secondary | ICD-10-CM | POA: Diagnosis not present

## 2022-05-24 DIAGNOSIS — N401 Enlarged prostate with lower urinary tract symptoms: Secondary | ICD-10-CM | POA: Diagnosis not present

## 2022-05-31 ENCOUNTER — Other Ambulatory Visit (HOSPITAL_BASED_OUTPATIENT_CLINIC_OR_DEPARTMENT_OTHER): Payer: Self-pay

## 2022-05-31 ENCOUNTER — Ambulatory Visit (INDEPENDENT_AMBULATORY_CARE_PROVIDER_SITE_OTHER): Payer: Medicare HMO | Admitting: Family Medicine

## 2022-05-31 VITALS — BP 142/75 | HR 75 | Temp 98.2°F | Ht 71.0 in | Wt 290.0 lb

## 2022-05-31 DIAGNOSIS — R7303 Prediabetes: Secondary | ICD-10-CM

## 2022-05-31 DIAGNOSIS — I10 Essential (primary) hypertension: Secondary | ICD-10-CM | POA: Diagnosis not present

## 2022-05-31 DIAGNOSIS — Z6841 Body Mass Index (BMI) 40.0 and over, adult: Secondary | ICD-10-CM

## 2022-05-31 DIAGNOSIS — I82401 Acute embolism and thrombosis of unspecified deep veins of right lower extremity: Secondary | ICD-10-CM

## 2022-05-31 MED ORDER — COMIRNATY 30 MCG/0.3ML IM SUSY
PREFILLED_SYRINGE | INTRAMUSCULAR | 0 refills | Status: DC
  Filled 2022-05-31: qty 0.3, 1d supply, fill #0

## 2022-06-12 ENCOUNTER — Ambulatory Visit (INDEPENDENT_AMBULATORY_CARE_PROVIDER_SITE_OTHER): Payer: Medicare HMO | Admitting: *Deleted

## 2022-06-12 VITALS — BP 150/77 | HR 75 | Ht 71.0 in | Wt 297.2 lb

## 2022-06-12 DIAGNOSIS — Z0000 Encounter for general adult medical examination without abnormal findings: Secondary | ICD-10-CM | POA: Diagnosis not present

## 2022-06-12 DIAGNOSIS — Z23 Encounter for immunization: Secondary | ICD-10-CM

## 2022-06-12 NOTE — Patient Instructions (Signed)
[name] , Thank you for taking time to come for your Medicare Wellness Visit. I appreciate your ongoing commitment to your health goals. Please review the following plan we discussed and let me know if I can assist you in the future.   These are the goals we discussed:  Goals      Patient Stated     Increase exercise        This is a list of the screening recommended for you and due dates:  Health Maintenance  Topic Date Due   Yearly kidney health urinalysis for diabetes  12/06/2021   COVID-19 Vaccine (6 - 2023-24 season) 03/17/2022   Colon Cancer Screening  05/01/2022   Eye exam for diabetics  10/01/2022   Hemoglobin A1C  10/11/2022   Complete foot exam   12/23/2022   Yearly kidney function blood test for diabetes  04/13/2023   Medicare Annual Wellness Visit  06/13/2023   Pneumonia Vaccine  Completed   Flu Shot  Completed   Hepatitis C Screening: USPSTF Recommendation to screen - Ages 18-79 yo.  Completed   Zoster (Shingles) Vaccine  Completed   HPV Vaccine  Aged Out     Next appointment: Follow up in one year for your annual wellness visit.   Preventive Care 14 Years and Older, Male Preventive care refers to lifestyle choices and visits with your health care provider that can promote health and wellness. What does preventive care include? A yearly physical exam. This is also called an annual well check. Dental exams once or twice a year. Routine eye exams. Ask your health care provider how often you should have your eyes checked. Personal lifestyle choices, including: Daily care of your teeth and gums. Regular physical activity. Eating a healthy diet. Avoiding tobacco and drug use. Limiting alcohol use. Practicing safe sex. Taking low doses of aspirin every day. Taking vitamin and mineral supplements as recommended by your health care provider. What happens during an annual well check? The services and screenings done by your health care provider during your  annual well check will depend on your age, overall health, lifestyle risk factors, and family history of disease. Counseling  Your health care provider may ask you questions about your: Alcohol use. Tobacco use. Drug use. Emotional well-being. Home and relationship well-being. Sexual activity. Eating habits. History of falls. Memory and ability to understand (cognition). Work and work Statistician. Screening  You may have the following tests or measurements: Height, weight, and BMI. Blood pressure. Lipid and cholesterol levels. These may be checked every 5 years, or more frequently if you are over 51 years old. Skin check. Lung cancer screening. You may have this screening every year starting at age 40 if you have a 30-pack-year history of smoking and currently smoke or have quit within the past 15 years. Fecal occult blood test (FOBT) of the stool. You may have this test every year starting at age 52. Flexible sigmoidoscopy or colonoscopy. You may have a sigmoidoscopy every 5 years or a colonoscopy every 10 years starting at age 76. Prostate cancer screening. Recommendations will vary depending on your family history and other risks. Hepatitis C blood test. Hepatitis B blood test. Sexually transmitted disease (STD) testing. Diabetes screening. This is done by checking your blood sugar (glucose) after you have not eaten for a while (fasting). You may have this done every 1-3 years. Abdominal aortic aneurysm (AAA) screening. You may need this if you are a current or former smoker. Osteoporosis. You may  be screened starting at age 45 if you are at high risk. Talk with your health care provider about your test results, treatment options, and if necessary, the need for more tests. Vaccines  Your health care provider may recommend certain vaccines, such as: Influenza vaccine. This is recommended every year. Tetanus, diphtheria, and acellular pertussis (Tdap, Td) vaccine. You may need a Td  booster every 10 years. Zoster vaccine. You may need this after age 62. Pneumococcal 13-valent conjugate (PCV13) vaccine. One dose is recommended after age 1. Pneumococcal polysaccharide (PPSV23) vaccine. One dose is recommended after age 56. Talk to your health care provider about which screenings and vaccines you need and how often you need them. This information is not intended to replace advice given to you by your health care provider. Make sure you discuss any questions you have with your health care provider. Document Released: 07/30/2015 Document Revised: 03/22/2016 Document Reviewed: 05/04/2015 Elsevier Interactive Patient Education  2017 [place] Prevention in the Home Falls can cause injuries. They can happen to people of all ages. There are many things you can do to make your home safe and to help prevent falls. What can I do on the outside of my home? Regularly fix the edges of walkways and driveways and fix any cracks. Remove anything that might make you trip as you walk through a door, such as a raised step or threshold. Trim any bushes or trees on the path to your home. Use bright outdoor lighting. Clear any walking paths of anything that might make someone trip, such as rocks or tools. Regularly check to see if handrails are loose or broken. Make sure that both sides of any steps have handrails. Any raised decks and porches should have guardrails on the edges. Have any leaves, snow, or ice cleared regularly. Use sand or salt on walking paths during winter. Clean up any spills in your garage right away. This includes oil or grease spills. What can I do in the bathroom? Use night lights. Install grab bars by the toilet and in the tub and shower. Do not use towel bars as grab bars. Use non-skid mats or decals in the tub or shower. If you need to sit down in the shower, use a plastic, non-slip stool. Keep the floor dry. Clean up any water that spills on the floor  as soon as it happens. Remove soap buildup in the tub or shower regularly. Attach bath mats securely with double-sided non-slip rug tape. Do not have throw rugs and other things on the floor that can make you trip. What can I do in the bedroom? Use night lights. Make sure that you have a light by your bed that is easy to reach. Do not use any sheets or blankets that are too big for your bed. They should not hang down onto the floor. Have a firm chair that has side arms. You can use this for support while you get dressed. Do not have throw rugs and other things on the floor that can make you trip. What can I do in the kitchen? Clean up any spills right away. Avoid walking on wet floors. Keep items that you use a lot in easy-to-reach places. If you need to reach something above you, use a strong step stool that has a grab bar. Keep electrical cords out of the way. Do not use floor polish or wax that makes floors slippery. If you must use wax, use non-skid floor wax. Do not  have throw rugs and other things on the floor that can make you trip. What can I do with my stairs? Do not leave any items on the stairs. Make sure that there are handrails on both sides of the stairs and use them. Fix handrails that are broken or loose. Make sure that handrails are as long as the stairways. Check any carpeting to make sure that it is firmly attached to the stairs. Fix any carpet that is loose or worn. Avoid having throw rugs at the top or bottom of the stairs. If you do have throw rugs, attach them to the floor with carpet tape. Make sure that you have a light switch at the top of the stairs and the bottom of the stairs. If you do not have them, ask someone to add them for you. What else can I do to help prevent falls? Wear shoes that: Do not have high heels. Have rubber bottoms. Are comfortable and fit you well. Are closed at the toe. Do not wear sandals. If you use a stepladder: Make sure that it is  fully opened. Do not climb a closed stepladder. Make sure that both sides of the stepladder are locked into place. Ask someone to hold it for you, if possible. Clearly mark and make sure that you can see: Any grab bars or handrails. First and last steps. Where the edge of each step is. Use tools that help you move around (mobility aids) if they are needed. These include: Canes. Walkers. Scooters. Crutches. Turn on the lights when you go into a dark area. Replace any light bulbs as soon as they burn out. Set up your furniture so you have a clear path. Avoid moving your furniture around. If any of your floors are uneven, fix them. If there are any pets around you, be aware of where they are. Review your medicines with your doctor. Some medicines can make you feel dizzy. This can increase your chance of falling. Ask your doctor what other things that you can do to help prevent falls. This information is not intended to replace advice given to you by your health care provider. Make sure you discuss any questions you have with your health care provider. Document Released: 04/29/2009 Document Revised: 12/09/2015 Document Reviewed: 08/07/2014 Elsevier Interactive Patient Education  2017 [employer].

## 2022-06-12 NOTE — Progress Notes (Signed)
Subjective:   Daniel Powers is a 66 y.o. male who presents for Medicare Annual/Subsequent preventive examination.  Review of Systems    Defer to PCP Cardiac Risk Factors include: advanced age (>36mn, >>77women);diabetes mellitus;dyslipidemia;hypertension;male gender;obesity (BMI >30kg/m2)     Objective:    Today's Vitals   06/12/22 0819  BP: (!) 150/77  Pulse: 75  Weight: 297 lb 3.2 oz (134.8 kg)  Height: '5\' 11"'$  (1.803 m)   Body mass index is 41.45 kg/m.     06/12/2022    8:25 AM 06/06/2021    8:09 AM  Advanced Directives  Does Patient Have a Medical Advance Directive? Yes Yes  Type of [place]amed[place]of [place] will  Does patient want to make changes to medical advance directive? No - Patient declined   Copy of [place] Chart? No - copy requested No - copy requested    Current Medications (verified) Outpatient Encounter Medications as of 06/12/2022  Medication Sig   albuterol (PROAIR HFA) 108 (90 Base) MCG/ACT inhaler Inhale 2 puffs into the lungs every 6 (six) hours as needed for wheezing.   amLODipine (NORVASC) 5 MG tablet TAKE 1 TABLET(5 MG) BY MOUTH DAILY   atorvastatin (LIPITOR) 10 MG tablet TAKE 1 TABLET(10 MG) BY MOUTH DAILY AT 6 PM   cetirizine (ZYRTEC) 10 MG tablet Take 10 mg by mouth daily.   Cholecalciferol (VITAMIN D3) 50 MCG (2000 UT) capsule Take 2 capsules (4,000 Units total) by mouth daily.   CINNAMON PO Take 1 tablet by mouth daily.   COVID-19 mRNA vaccine 2023-2024 (COMIRNATY) syringe Inject into the muscle.   ELIQUIS 5 MG TABS tablet TAKE 1 TABLET(5 MG) BY MOUTH TWICE DAILY   fish oil-omega-3 fatty acids 1000 MG capsule Take 1 g by mouth daily.   Flaxseed, Linseed, 1000 MG CAPS Take 1 capsule by mouth daily.   fluticasone (FLONASE) 50 MCG/ACT nasal spray Place 2 sprays into both nostrils daily.   olmesartan (BENICAR) 40 MG tablet TAKE 1 TABLET(40 MG) BY MOUTH DAILY    tadalafil (CIALIS) 20 MG tablet Take 20 mg by mouth daily as needed.   No facility-administered encounter medications on file as of 06/12/2022.    Allergies (verified) Penicillins   History: Past Medical History:  Diagnosis Date   Anxiety    Arthritis    Asthma    COPD (chronic obstructive pulmonary disease) (HCC)    DVT (deep venous thrombosis) (HCC)    Gout    High cholesterol    Hypertension    Influenza with pneumonia    Lactose intolerance    Obesity    Pre-diabetes    Psychosexual dysfunction with inhibited sexual excitement    Past Surgical History:  Procedure Laterality Date   HAND SURGERY     right   TONSILLECTOMY AND ADENOIDECTOMY     Family History  Problem Relation Age of Onset   Hypertension Mother    Alzheimer's disease Mother    Hypertension Father    Alcohol abuse Father    Obesity Father    Cancer Sister        "male organs"   Stroke Brother    Heart disease Brother 636      MI   Hypertension Maternal Grandmother    Alzheimer's disease Maternal Grandmother    Hypertension Maternal Grandfather    Hypertension Paternal Grandmother    Hypertension Paternal Grandfather    Lung cancer Maternal Uncle  Esophageal cancer Maternal Uncle    Alzheimer's disease Paternal Aunt    Cancer Niece    Colon cancer Neg Hx    Social History   Socioeconomic History   Marital status: Married    Spouse name: Becky   Number of children: 1   Years of education: Not on file   Highest education level: Not on file  Occupational History   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    Employer: SELF  Tobacco Use   Smoking status: Never   Smokeless tobacco: Never  Substance and Sexual Activity   Alcohol use: Yes    Comment: ocass- once or twice a month   Drug use: No   Sexual activity: Yes    Partners: Female  Other Topics Concern   Not on file  Social History Narrative   Exercise --a little-- walking    Social Determinants of Health   Financial Resource  Strain: Low Risk  (06/06/2021)   Overall Financial Resource Strain (CARDIA)    Difficulty of Paying Living Expenses: Not hard at all  Food Insecurity: No Food Insecurity (06/12/2022)   Hunger Vital Sign    Worried About Running Out of Food in the Last Year: Never true    [place] in the Last Year: Never true  Transportation Needs: No Transportation Needs (06/12/2022)   PRAPARE - Hydrologist (Medical): No    Lack of Transportation (Non-Medical): No  Physical Activity: Inactive (06/06/2021)   Exercise Vital Sign    Days of Exercise per Week: 0 days    Minutes of Exercise per Session: 0 min  Stress: No Stress Concern Present (06/06/2021)   [place]    Feeling of Stress : Not at all  Social Connections: [place] (06/06/2021)   Social Connection and Isolation Panel [NHANES]    Frequency of Communication with Friends and Family: More than three times a week    Frequency of Social Gatherings with Friends and Family: More than three times a week    Attends Religious Services: More than 4 times per year    Active Member of [employer] or Organizations: Yes    Attends Music therapist: More than 4 times per year    Marital Status: Married    Tobacco Counseling Counseling given: Not Answered   Clinical Intake:  Pre-visit preparation completed: Yes  Pain : No/denies pain  How often do you need to have someone help you when you read instructions, pamphlets, or other written materials from your doctor or pharmacy?: 1 - Never  Diabetic? Nutrition Risk Assessment:  Has the patient had any N/V/D within the last 2 months?  No  Does the patient have any non-healing wounds?  No  Has the patient had any unintentional weight loss or weight gain?  No   Diabetes:  Is the patient diabetic?  Yes  If diabetic, was a CBG obtained today?  No  Did the patient bring in their  glucometer from home?  No  How often do you monitor your CBG's? never.   Financial Strains and Diabetes Management:  Are you having any financial strains with the device, your supplies or your medication? No .  Does the patient want to be seen by Chronic Care Management for management of their diabetes?  No  Would the patient like to be referred to a Nutritionist or for Diabetic Management?  No   Diabetic Exams:  Diabetic Eye Exam: Completed 09/30/21 Diabetic Foot Exam: Completed 12/22/21    Interpreter Needed?: No  Information entered by :: Beatris Ship, [place]   Activities of Daily Living    06/12/2022    8:26 AM  In your present state of health, do you have any difficulty performing the following activities:  Hearing? 0  Vision? 0  Difficulty concentrating or making decisions? 0  Walking or climbing stairs? 0  Dressing or bathing? 0  Doing errands, shopping? 0  Preparing Food and eating ? N  Using the Toilet? N  In the past six months, have you accidently leaked urine? Y  Do you have problems with loss of bowel control? N  Managing your Medications? N  Managing your Finances? N  Housekeeping or managing your Housekeeping? N    Patient Care Team: Carollee Herter, Alferd Apa, DO as PCP - General Irine Seal, MD as Attending Physician (Urology) Irine Seal, MD as Attending Physician (Urology) Donell Sievert, MD as Referring Physician (Ophthalmology)  Indicate any recent Medical Services you may have received from other than [facility] providers in the past year (date may be approximate).     Assessment:   This is a routine wellness examination for Daniel Powers.  Hearing/Vision screen No results found.  Dietary issues and exercise activities discussed: Current Exercise Habits: Home exercise routine, Type of exercise: walking, Time (Minutes): 60, Frequency (Times/Week): 1, Weekly Exercise (Minutes/Week): 60, Intensity: Mild, Exercise limited by: None identified   Goals Addressed              This Visit's Progress    Patient Stated   On track    Increase exercise       Depression Screen    06/12/2022    8:25 AM 12/22/2021    8:52 AM 06/06/2021    8:11 AM 12/04/2019    8:42 AM 12/25/2018    8:23 AM 08/05/2018   10:04 AM 09/09/2016    1:47 PM  PHQ 2/9 Scores  PHQ - 2 Score 0 0 0 0 0 0 0  PHQ- 9 Score     1 0   Exception Documentation     Medical reason      Fall Risk    06/12/2022    8:25 AM 12/22/2021    8:52 AM 06/06/2021    8:09 AM 12/04/2019    8:42 AM 09/09/2016    1:47 PM  Fall Risk   Falls in the past year? 0 0 0 0 No  Number falls in past yr: 0 0 0 0   Injury with Fall? 0 0 0 0   Risk for fall due to : No Fall Risks      Follow up Falls evaluation completed Falls evaluation completed Falls prevention discussed Falls evaluation completed     [place]:  Any stairs in or around the home? Yes  If so, are there any without handrails? No  Home free of loose throw rugs in walkways, pet beds, electrical cords, etc? Yes  Adequate lighting in your home to reduce risk of falls? Yes   ASSISTIVE DEVICES UTILIZED TO PREVENT FALLS:  Life alert? No  Use of a cane, walker or w/c? No  Grab bars in the bathroom? Yes  Shower chair or bench in shower? No  Elevated toilet seat or a handicapped toilet? No   TIMED UP AND GO:  Was the test performed? Yes .  Length of time to ambulate 10 feet: 5 sec.  Gait steady and fast without use of assistive device  Cognitive Function:        06/12/2022    8:29 AM  6CIT Screen  What Year? 0 points  What month? 0 points  What time? 0 points  Count back from 20 0 points  Months in reverse 0 points  Repeat phrase 0 points  Total Score 0 points    Immunizations Immunization History  Administered Date(s) Administered   COVID-19, mRNA, vaccine(Comirnaty)12 years and older 05/31/2022   Fluad Quad(high Dose 65+) 06/07/2020, 06/06/2021, 06/12/2022   Influenza Split 05/04/2011    Influenza Whole 07/26/2010   Influenza,inj,Quad PF,6+ Mos 06/03/2013, 06/18/2014, 07/29/2015, 06/29/2016, 05/08/2017, 04/24/2019   Influenza-Unspecified 06/20/2018   PFIZER Comirnaty(Gray Top)Covid-19 Tri-Sucrose Vaccine 11/17/2020   PFIZER(Purple Top)SARS-COV-2 Vaccination 09/07/2019, 10/04/2019, 04/28/2020   PNEUMOCOCCAL CONJUGATE-20 12/06/2020   Pfizer Covid-19 Vaccine Bivalent Booster 107yr & up 06/13/2021   Pneumococcal Polysaccharide-23 09/12/2010   Td 09/06/2009   Zoster Recombinat (Shingrix) 06/28/2017, 11/08/2017   Zoster, Live 11/26/2014    TDAP status: Due, Education has been provided regarding the importance of this vaccine. Advised may receive this vaccine at local pharmacy or Health Dept. Aware to provide a copy of the vaccination record if obtained from local pharmacy or Health Dept. Verbalized acceptance and understanding.  Flu Vaccine status: Completed at today's visit  Pneumococcal vaccine status: Up to date  Covid-19 vaccine status: Information provided on how to obtain vaccines.   Qualifies for Shingles Vaccine? Yes   Zostavax completed Yes   Shingrix Completed?: Yes  Screening Tests Health Maintenance  Topic Date Due   Diabetic kidney evaluation - Urine ACR  12/06/2021   COVID-19 Vaccine (6 - 2023-24 season) 03/17/2022   COLONOSCOPY (Pts 45-493yrInsurance coverage will need to be confirmed)  05/01/2022   Medicare Annual Wellness (AWV)  06/06/2022   OPHTHALMOLOGY EXAM  10/01/2022   HEMOGLOBIN A1C  10/11/2022   FOOT EXAM  12/23/2022   Diabetic kidney evaluation - GFR measurement  04/13/2023   Pneumonia Vaccine 6557Years old  Completed   INFLUENZA VACCINE  Completed   Hepatitis C Screening  Completed   Zoster Vaccines- Shingrix  Completed   HPV VACCINES  Aged Out    Health Maintenance  Health Maintenance Due  Topic Date Due   Diabetic kidney evaluation - Urine ACR  12/06/2021   COVID-19 Vaccine (6 - 2023-24 season) 03/17/2022   COLONOSCOPY (Pts  [id number] coverage will need to be confirmed)  05/01/2022   Medicare Annual Wellness (AWV)  06/06/2022    Colorectal cancer screening: Type of screening: Colonoscopy. Completed 05/01/17. Repeat every 5 years  Lung Cancer Screening: (Low Dose CT Chest recommended if Age 49-74-80ars, 30 pack-year currently smoking OR have quit w/in 15years.) does not qualify.   Additional Screening:  Hepatitis C Screening: does qualify; Completed 06/29/16  Vision Screening: Recommended annual ophthalmology exams for early detection of glaucoma and other disorders of the eye. Is the patient up to date with their annual eye exam?  Yes  Who is the provider or what is the name of the office in which the patient attends annual eye exams? Dr. JamDonell Sievert pt is not established with a provider, would they like to be referred to a provider to establish care? No .   Dental Screening: Recommended annual dental exams for proper oral hygiene  Community Resource Referral / Chronic Care Management: CRR required this visit?  No   CCM required this visit?  No  Plan:     I have personally reviewed and noted the following in the patient's chart:   Medical and social history Use of alcohol, tobacco or illicit drugs  Current medications and supplements including opioid prescriptions. Patient is not currently taking opioid prescriptions. Functional ability and status Nutritional status Physical activity Advanced directives List of other physicians Hospitalizations, surgeries, and ER visits in previous 12 months Vitals Screenings to include cognitive, depression, and falls Referrals and appointments  In addition, I have reviewed and discussed with patient certain preventive protocols, quality metrics, and best practice recommendations. A written personalized care plan for preventive services as well as general preventive health recommendations were provided to patient.     Beatris Ship,  [place]   06/12/2022   Nurse Notes: None

## 2022-06-13 NOTE — Progress Notes (Signed)
Chief Complaint:   OBESITY Daniel Powers is here to discuss his progress with his obesity treatment plan along with follow-up of his obesity related diagnoses. Daniel Powers is on the Category 2 Plan with lunch options and states he is following his eating plan approximately 50% of the time. Daniel Powers states he is walking for 60 minutes 1 time per week.  Today's visit was #: 60 Starting weight: 309 lbs Starting date: 12/25/2018 Today's weight: 290 lbs Today's date: 05/31/2022 Total lbs lost to date: 19 Total lbs lost since last in-office visit: 2  Interim History: Daniel Powers has done well with his weight loss overall.  He reports cravings especially for ice cream and cookies.  Subjective:   1. Essential hypertension [place] is taking Norvasc and Benicar with no side effects noted.  His blood pressure is slightly elevated today but usually good.  2. Prediabetes Daniel Powers's last A1c was 5.7, and he is not on medications.  He is working on his diet and exercise.  3. 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  He is working on his eating plan to promote weight loss.  Assessment/Plan:   1. Essential hypertension Daniel Powers will continue with his medications and will continue to monitor.  He will continue with his eating plan and exercise.  2. Prediabetes Daniel Powers will continue with his healthy eating plan and exercise.  3. 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  4. Class 3 severe obesity with serious comorbidity and body mass index (BMI) of 40.0 to 44.9 in adult, unspecified obesity type Daniel Powers) Daniel Powers is currently in the action stage of change. As such, his goal is to continue with weight loss efforts. He has agreed to the Category 2 Plan.   Exercise goals: As is.   Behavioral modification strategies: increasing lean  protein intake, decreasing simple carbohydrates, and holiday eating strategies .  Daniel Powers has agreed to follow-up with our clinic in 4 weeks. He was informed of the importance of frequent follow-up visits to maximize his success with intensive lifestyle modifications for his multiple health conditions.   Objective:   Blood pressure (!) 142/75, pulse 75, temperature 98.2 F (36.8 C), height '5\' 11"'$  (1.803 m), weight 290 lb (131.5 kg), SpO2 97 %. Body mass index is 40.45 kg/m.  General: Cooperative, alert, well developed, in no acute distress. HEENT: Conjunctivae and lids unremarkable. Cardiovascular: Regular rhythm.  Lungs: Normal work of breathing. Neurologic: No focal deficits.   Lab Results  Component Value Date   CREATININE 0.77 04/12/2022   BUN 17 04/12/2022   NA 140 04/12/2022   K 4.1 04/12/2022   CL 104 04/12/2022   CO2 22 04/12/2022   Lab Results  Component Value Date   ALT 12 04/12/2022   AST 13 04/12/2022   ALKPHOS 47 04/12/2022   BILITOT 0.8 04/12/2022   Lab Results  Component Value Date   HGBA1C 5.7 (H) 04/12/2022   HGBA1C 5.7 (H) 10/03/2021   HGBA1C 5.8 (H) 04/18/2021   HGBA1C 5.7 (H) 09/30/2020   HGBA1C 5.9 (H) 05/31/2020   Lab Results  Component Value Date   INSULIN 9.6 04/12/2022   INSULIN 11.0 10/03/2021   INSULIN 7.6 09/30/2020   INSULIN 11.1 05/31/2020   INSULIN 6.7 02/04/2020   Lab Results  Component Value Date   TSH 0.864 04/12/2022   Lab Results  Component Value Date   CHOL 128 04/12/2022   HDL 45 04/12/2022   LDLCALC 70 04/12/2022   TRIG 58 04/12/2022   CHOLHDL 3.2 10/03/2021   Lab Results  Component Value Date   VD25OH 75.0 04/12/2022   VD25OH 69.7 10/03/2021   VD25OH 72.7 04/18/2021   Lab Results  Component Value Date   WBC 6.2 10/03/2021   HGB 14.5 10/03/2021   HCT 43.8 10/03/2021   MCV 86 10/03/2021   PLT 191 10/03/2021   No results found for: "IRON", "TIBC", "FERRITIN"  Attestation Statements:   Reviewed by  clinician on day of visit: allergies, medications, problem list, medical history, surgical history, family history, social history, and previous encounter notes.  Time spent on visit including pre-visit chart review and post-visit care and charting was 20 minutes.   I, Trixie Dredge, am acting as transcriptionist for Dennard Nip, MD.  I have reviewed the above documentation for accuracy and completeness, and I agree with the above. -  Dennard Nip, MD

## 2022-06-20 ENCOUNTER — Encounter: Payer: Self-pay | Admitting: Family Medicine

## 2022-06-20 ENCOUNTER — Other Ambulatory Visit (HOSPITAL_BASED_OUTPATIENT_CLINIC_OR_DEPARTMENT_OTHER): Payer: Self-pay

## 2022-06-20 ENCOUNTER — Ambulatory Visit (INDEPENDENT_AMBULATORY_CARE_PROVIDER_SITE_OTHER): Payer: Medicare HMO | Admitting: Family Medicine

## 2022-06-20 VITALS — BP 138/80 | HR 80 | Temp 98.5°F | Resp 18 | Ht 71.0 in | Wt 295.6 lb

## 2022-06-20 DIAGNOSIS — I152 Hypertension secondary to endocrine disorders: Secondary | ICD-10-CM | POA: Diagnosis not present

## 2022-06-20 DIAGNOSIS — E785 Hyperlipidemia, unspecified: Secondary | ICD-10-CM

## 2022-06-20 DIAGNOSIS — E1159 Type 2 diabetes mellitus with other circulatory complications: Secondary | ICD-10-CM

## 2022-06-20 DIAGNOSIS — E669 Obesity, unspecified: Secondary | ICD-10-CM

## 2022-06-20 DIAGNOSIS — K635 Polyp of colon: Secondary | ICD-10-CM | POA: Diagnosis not present

## 2022-06-20 DIAGNOSIS — E1169 Type 2 diabetes mellitus with other specified complication: Secondary | ICD-10-CM | POA: Diagnosis not present

## 2022-06-20 DIAGNOSIS — G4733 Obstructive sleep apnea (adult) (pediatric): Secondary | ICD-10-CM

## 2022-06-20 DIAGNOSIS — M1000 Idiopathic gout, unspecified site: Secondary | ICD-10-CM | POA: Diagnosis not present

## 2022-06-20 LAB — MICROALBUMIN / CREATININE URINE RATIO
Creatinine,U: 213.7 mg/dL
Microalb Creat Ratio: 0.5 mg/g (ref 0.0–30.0)
Microalb, Ur: 1 mg/dL (ref 0.0–1.9)

## 2022-06-20 MED ORDER — AREXVY 120 MCG/0.5ML IM SUSR
INTRAMUSCULAR | 0 refills | Status: DC
  Filled 2022-06-20: qty 1, 1d supply, fill #0

## 2022-06-20 MED ORDER — BOOSTRIX 5-2.5-18.5 LF-MCG/0.5 IM SUSY
PREFILLED_SYRINGE | INTRAMUSCULAR | 0 refills | Status: DC
  Filled 2022-06-20: qty 0.5, 1d supply, fill #0

## 2022-06-20 NOTE — Assessment & Plan Note (Signed)
Diet controlled  Doing well with diet and exercise

## 2022-06-20 NOTE — Assessment & Plan Note (Signed)
Encourage heart healthy diet such as MIND or DASH diet, increase exercise, avoid trans fats, simple carbohydrates and processed foods, consider a krill or fish or flaxseed oil cap daily.  °

## 2022-06-20 NOTE — Assessment & Plan Note (Signed)
Since avoiding red meat he has not had any problems

## 2022-06-20 NOTE — Patient Instructions (Signed)
Hypertension, Adult High blood pressure (hypertension) is when the force of blood pumping through the arteries is too strong. The arteries are the blood vessels that carry blood from the heart throughout the body. Hypertension forces the heart to work harder to pump blood and may cause arteries to become narrow or stiff. Untreated or uncontrolled hypertension can lead to a heart attack, heart failure, a stroke, kidney disease, and other problems. A blood pressure reading consists of a higher number over a lower number. Ideally, your blood pressure should be below 120/80. The first ("top") number is called the systolic pressure. It is a measure of the pressure in your arteries as your heart beats. The second ("bottom") number is called the diastolic pressure. It is a measure of the pressure in your arteries as the heart relaxes. What are the causes? The exact cause of this condition is not known. There are some conditions that result in high blood pressure. What increases the risk? Certain factors may make you more likely to develop high blood pressure. Some of these risk factors are under your control, including: Smoking. Not getting enough exercise or physical activity. Being overweight. Having too much fat, sugar, calories, or salt (sodium) in your diet. Drinking too much alcohol. Other risk factors include: Having a personal history of heart disease, diabetes, high cholesterol, or kidney disease. Stress. Having a family history of high blood pressure and high cholesterol. Having obstructive sleep apnea. Age. The risk increases with age. What are the signs or symptoms? High blood pressure may not cause symptoms. Very high blood pressure (hypertensive crisis) may cause: Headache. Fast or irregular heartbeats (palpitations). Shortness of breath. Nosebleed. Nausea and vomiting. Vision changes. Severe chest pain, dizziness, and seizures. How is this diagnosed? This condition is diagnosed by  measuring your blood pressure while you are seated, with your arm resting on a flat surface, your legs uncrossed, and your feet flat on the floor. The cuff of the blood pressure monitor will be placed directly against the skin of your upper arm at the level of your heart. Blood pressure should be measured at least twice using the same arm. Certain conditions can cause a difference in blood pressure between your right and left arms. If you have a high blood pressure reading during one visit or you have normal blood pressure with other risk factors, you may be asked to: Return on a different day to have your blood pressure checked again. Monitor your blood pressure at home for 1 week or longer. If you are diagnosed with hypertension, you may have other blood or imaging tests to help your health care provider understand your overall risk for other conditions. How is this treated? This condition is treated by making healthy lifestyle changes, such as eating healthy foods, exercising more, and reducing your alcohol intake. You may be referred for counseling on a healthy diet and physical activity. Your health care provider may prescribe medicine if lifestyle changes are not enough to get your blood pressure under control and if: Your systolic blood pressure is above 130. Your diastolic blood pressure is above 80. Your personal target blood pressure may vary depending on your medical conditions, your age, and other factors. Follow these instructions at home: Eating and drinking  Eat a diet that is high in fiber and potassium, and low in sodium, added sugar, and fat. An example of this eating plan is called the DASH diet. DASH stands for Dietary Approaches to Stop Hypertension. To eat this way: Eat   plenty of fresh fruits and vegetables. Try to fill one half of your plate at each meal with fruits and vegetables. Eat whole grains, such as whole-wheat pasta, brown rice, or whole-grain bread. Fill about one  fourth of your plate with whole grains. Eat or drink low-fat dairy products, such as skim milk or low-fat yogurt. Avoid fatty cuts of meat, processed or cured meats, and poultry with skin. Fill about one fourth of your plate with lean proteins, such as fish, chicken without skin, beans, eggs, or tofu. Avoid pre-made and processed foods. These tend to be higher in sodium, added sugar, and fat. Reduce your daily sodium intake. Many people with hypertension should eat less than 1,500 mg of sodium a day. Do not drink alcohol if: Your health care provider tells you not to drink. You are pregnant, may be pregnant, or are planning to become pregnant. If you drink alcohol: Limit how much you have to: 0-1 drink a day for women. 0-2 drinks a day for men. Know how much alcohol is in your drink. In the [place] one drink equals one 12 oz bottle of beer (355 mL), one 5 oz glass of wine (148 mL), or one 1 oz glass of hard liquor (44 mL). Lifestyle  Work with your health care provider to maintain a healthy body weight or to lose weight. Ask what an ideal weight is for you. Get at least 30 minutes of exercise that causes your heart to beat faster (aerobic exercise) most days of the week. Activities may include walking, swimming, or biking. Include exercise to strengthen your muscles (resistance exercise), such as Pilates or lifting weights, as part of your weekly exercise routine. Try to do these types of exercises for 30 minutes at least 3 days a week. Do not use any products that contain nicotine or tobacco. These products include cigarettes, chewing tobacco, and vaping devices, such as e-cigarettes. If you need help quitting, ask your health care provider. Monitor your blood pressure at home as told by your health care provider. Keep all follow-up visits. This is important. Medicines Take over-the-counter and prescription medicines only as told by your health care provider. Follow directions carefully. Blood  pressure medicines must be taken as prescribed. Do not skip doses of blood pressure medicine. Doing this puts you at risk for problems and can make the medicine less effective. Ask your health care provider about side effects or reactions to medicines that you should watch for. Contact a health care provider if you: Think you are having a reaction to a medicine you are taking. Have headaches that keep coming back (recurring). Feel dizzy. Have swelling in your ankles. Have trouble with your vision. Get help right away if you: Develop a severe headache or confusion. Have unusual weakness or numbness. Feel faint. Have severe pain in your chest or abdomen. Vomit repeatedly. Have trouble breathing. These symptoms may be an emergency. Get help right away. Call 911. Do not wait to see if the symptoms will go away. Do not drive yourself to the hospital. Summary Hypertension is when the force of blood pumping through your arteries is too strong. If this condition is not controlled, it may put you at risk for serious complications. Your personal target blood pressure may vary depending on your medical conditions, your age, and other factors. For most people, a normal blood pressure is less than 120/80. Hypertension is treated with lifestyle changes, medicines, or a combination of both. Lifestyle changes include losing weight, eating a healthy,   low-sodium diet, exercising more, and limiting alcohol. This information is not intended to replace advice given to you by your health care provider. Make sure you discuss any questions you have with your health care provider. Document Revised: 05/10/2021 Document Reviewed: 05/10/2021 Elsevier Patient Education  2023 Elsevier Inc.  

## 2022-06-20 NOTE — Assessment & Plan Note (Signed)
Since he lost weight no cpap

## 2022-06-20 NOTE — Assessment & Plan Note (Signed)
Well controlled, no changes to meds. Encouraged heart healthy diet such as the DASH diet and exercise as tolerated.  °

## 2022-06-20 NOTE — Progress Notes (Signed)
Subjective:   By signing my name below, I, Daniel Powers, attest that this documentation has been prepared under the direction and in the presence of Dr. Roma Schanz, DO. 06/20/2022    Patient ID: Daniel Powers, male    DOB: [birth year], 66 y.o.   MRN: [id number]  Chief Complaint  Patient presents with   Hypertension   Hyperlipidemia   Follow-up    Hypertension Pertinent negatives include no blurred vision, chest pain, headaches, malaise/fatigue, palpitations or shortness of breath.  Hyperlipidemia Pertinent negatives include no chest pain or shortness of breath.   Patient is in today for a follow up visit.   He continues seeing a healthy weight and wellness clinic. He is struggling to lose weight past 290 lb's. His insulin level decreased from 11 to 9. His A1c has also decreased. His last cholesterol levels were good.  Wt Readings from Last 3 Encounters:  06/20/22 295 lb 9.6 oz (134.1 kg)  06/12/22 297 lb 3.2 oz (134.8 kg)  05/31/22 290 lb (131.5 kg)   Lab Results  Component Value Date   HGBA1C 5.7 (H) 04/12/2022   His blood pressure is doing ok during this visit. He continues taking 5 mg amlodipine daily PO, 40 mg benicar and reports no new issues while taking them. He denies having any leg swelling.  BP Readings from Last 3 Encounters:  06/20/22 138/80  06/12/22 (!) 150/77  05/31/22 (!) 142/75   Pulse Readings from Last 3 Encounters:  06/20/22 80  06/12/22 75  05/31/22 75   He is overdue for a colonoscopy. Patient did not receive a letter to notify him to schedule and appointment.  He continues taking 5 mg eliquis daily PO and reports no new issues while taking it.  He has no recent gout flare ups. Red meat typically triggers his gout so he has been avoiding it and ha no new issues since.  He has an Albuterol inhaler he uses PRN but has not used it in a while.  He does not use a CPAP machine since it felt too uncomfortable using it. He is sleeping better since  he lost weight.  He received the latest Covid-19 booster vaccine 3 weeks ago at his pharmacy. He is interested in receiving the RSV vaccine at his pharmacy.   He is UTD on medication refills at this time.    Past Medical History:  Diagnosis Date   Anxiety    Arthritis    Asthma    COPD (chronic obstructive pulmonary disease) (HCC)    DVT (deep venous thrombosis) (HCC)    Gout    High cholesterol    Hypertension    Influenza with pneumonia    Lactose intolerance    Obesity    Pre-diabetes    Psychosexual dysfunction with inhibited sexual excitement     Past Surgical History:  Procedure Laterality Date   HAND SURGERY     right   TONSILLECTOMY AND ADENOIDECTOMY      Family History  Problem Relation Age of Onset   Hypertension Mother    Alzheimer's disease Mother    Hypertension Father    Alcohol abuse Father    Obesity Father    Cancer Sister        "male organs"   Stroke Brother    Heart disease Brother 71       MI   Hypertension Maternal Grandmother    Alzheimer's disease Maternal Grandmother    Hypertension Maternal Grandfather    Hypertension  Paternal Grandmother    Hypertension Paternal Grandfather    Lung cancer Maternal Uncle    Esophageal cancer Maternal Uncle    Alzheimer's disease Paternal Aunt    Cancer Niece    Colon cancer Neg Hx     Social History   Socioeconomic History   Marital status: Married    Spouse name: Becky   Number of children: 1   Years of education: Not on file   Highest education level: Not on file  Occupational History   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    Employer: S[employer]  Tobacco Use   Smoking status: Never   Smokeless tobacco: Never  Substance and Sexual Activity   Alcohol use: Yes    Comment: ocass- once or twice a month   Drug use: No   Sexual activity: Yes    Partners: Female  Other Topics Concern   Not on file  Social History Narrative   Exercise --a little-- walking    Social Determinants of Health    Financial Resource Strain: Low Risk  (06/06/2021)   Overall Financial Resource Strain (CARDIA)    Difficulty of Paying Living Expenses: Not hard at all  Food Insecurity: No Food Insecurity (06/12/2022)   Hunger Vital Sign    Worried About Running Out of Food in the Last Year: Never true    [place] in the Last Year: Never true  Transportation Needs: No Transportation Needs (06/12/2022)   PRAPARE - Hydrologist (Medical): No    Lack of Transportation (Non-Medical): No  Physical Activity: Inactive (06/06/2021)   Exercise Vital Sign    Days of Exercise per Week: 0 days    Minutes of Exercise per Session: 0 min  Stress: No Stress Concern Present (06/06/2021)   [place]    Feeling of Stress : Not at all  Social Connections: [place] (06/06/2021)   Social Connection and Isolation Panel [NHANES]    Frequency of Communication with Friends and Family: More than three times a week    Frequency of Social Gatherings with Friends and Family: More than three times a week    Attends Religious Services: More than 4 times per year    Active Member of [employer] or Organizations: Yes    Attends Music therapist: More than 4 times per year    Marital Status: Married  Human resources officer Violence: Not At Risk (06/12/2022)   Humiliation, Afraid, Rape, and Kick questionnaire    Fear of Current or Ex-Partner: No    Emotionally Abused: No    Physically Abused: No    Sexually Abused: No    Outpatient Medications Prior to Visit  Medication Sig Dispense Refill   albuterol (PROAIR HFA) 108 (90 Base) MCG/ACT inhaler Inhale 2 puffs into the lungs every 6 (six) hours as needed for wheezing. 1 Inhaler 2   amLODipine (NORVASC) 5 MG tablet TAKE 1 TABLET(5 MG) BY MOUTH DAILY 90 tablet 1   atorvastatin (LIPITOR) 10 MG tablet TAKE 1 TABLET(10 MG) BY MOUTH DAILY AT 6 PM 90 tablet 1   cetirizine  (ZYRTEC) 10 MG tablet Take 10 mg by mouth daily.     Cholecalciferol (VITAMIN D3) 50 MCG (2000 UT) capsule Take 2 capsules (4,000 Units total) by mouth daily. 30 capsule 0   CINNAMON PO Take 1 tablet by mouth daily.     ELIQUIS 5 MG TABS tablet TAKE 1 TABLET(5 MG) BY  MOUTH TWICE DAILY 60 tablet 5   fish oil-omega-3 fatty acids 1000 MG capsule Take 1 g by mouth daily.     Flaxseed, Linseed, 1000 MG CAPS Take 1 capsule by mouth daily.     fluticasone (FLONASE) 50 MCG/ACT nasal spray Place 2 sprays into both nostrils daily. 16 g 6   olmesartan (BENICAR) 40 MG tablet TAKE 1 TABLET(40 MG) BY MOUTH DAILY 90 tablet 1   tadalafil (CIALIS) 20 MG tablet Take 20 mg by mouth daily as needed.     COVID-19 mRNA vaccine 2023-2024 (COMIRNATY) syringe Inject into the muscle. (Patient not taking: Reported on 06/20/2022) 0.3 mL 0   No facility-administered medications prior to visit.    Allergies  Allergen Reactions   Penicillins     REACTION: Penicillin    Review of Systems  Constitutional:  Negative for fever and malaise/fatigue.  HENT:  Negative for congestion.   Eyes:  Negative for blurred vision.  Respiratory:  Negative for shortness of breath.   Cardiovascular:  Negative for chest pain, palpitations and leg swelling.  Gastrointestinal:  Negative for abdominal pain, blood in stool and nausea.  Genitourinary:  Negative for dysuria and frequency.  Musculoskeletal:  Negative for falls.  Skin:  Negative for rash.  Neurological:  Negative for dizziness, loss of consciousness and headaches.  Endo/Heme/Allergies:  Negative for environmental allergies.  Psychiatric/Behavioral:  Negative for depression. The patient is not nervous/anxious.        Objective:    Physical Exam Constitutional:      General: He is not in acute distress.    Appearance: Normal appearance. He is not ill-appearing.  HENT:     Head: Normocephalic and atraumatic.     Right Ear: External ear normal.     Left Ear: External  ear normal.  Eyes:     Extraocular Movements: Extraocular movements intact.     Pupils: Pupils are equal, round, and reactive to light.  Cardiovascular:     Rate and Rhythm: Normal rate and regular rhythm.     Heart sounds: Normal heart sounds. No murmur heard.    No gallop.  Pulmonary:     Effort: Pulmonary effort is normal. No respiratory distress.     Breath sounds: Normal breath sounds. No wheezing or rales.  Skin:    General: Skin is warm and dry.  Neurological:     Mental Status: He is alert and oriented to person, place, and time.  Psychiatric:        Judgment: Judgment normal.     BP 138/80 (BP Location: Left Arm, Patient Position: Sitting, Cuff Size: Large)   Pulse 80   Temp 98.5 F (36.9 C) (Oral)   Resp 18   Ht _0  (1.803 m)   Wt 295 lb 9.6 oz (134.1 kg)   SpO2 96%   BMI 41.23 kg/m  Wt Readings from Last 3 Encounters:  06/20/22 295 lb 9.6 oz (134.1 kg)  06/12/22 297 lb 3.2 oz (134.8 kg)  05/31/22 290 lb (131.5 kg)    Diabetic Foot Exam - Simple   No data filed    Lab Results  Component Value Date   WBC 6.2 10/03/2021   HGB 14.5 10/03/2021   HCT 43.8 10/03/2021   PLT 191 10/03/2021   GLUCOSE 85 04/12/2022   CHOL 128 04/12/2022   TRIG 58 04/12/2022   HDL 45 04/12/2022   LDLCALC 70 04/12/2022   ALT 12 04/12/2022   AST 13 04/12/2022   NA 140 04/12/2022  K 4.1 04/12/2022   CL 104 04/12/2022   CREATININE 0.77 04/12/2022   BUN 17 04/12/2022   CO2 22 04/12/2022   TSH 0.864 04/12/2022   PSA 1.31 06/28/2017   INR 1.8 (A) 09/16/2019   HGBA1C 5.7 (H) 04/12/2022   MICROALBUR 0.7 12/06/2020    Lab Results  Component Value Date   TSH 0.864 04/12/2022   Lab Results  Component Value Date   WBC 6.2 10/03/2021   HGB 14.5 10/03/2021   HCT 43.8 10/03/2021   MCV 86 10/03/2021   PLT 191 10/03/2021   Lab Results  Component Value Date   NA 140 04/12/2022   K 4.1 04/12/2022   CO2 22 04/12/2022   GLUCOSE 85 04/12/2022   BUN 17 04/12/2022    CREATININE 0.77 04/12/2022   BILITOT 0.8 04/12/2022   ALKPHOS 47 04/12/2022   AST 13 04/12/2022   ALT 12 04/12/2022   PROT 6.9 04/12/2022   ALBUMIN 4.2 04/12/2022   CALCIUM 9.3 04/12/2022   EGFR 98 04/12/2022   GFR 89.68 09/16/2019   Lab Results  Component Value Date   CHOL 128 04/12/2022   Lab Results  Component Value Date   HDL 45 04/12/2022   Lab Results  Component Value Date   LDLCALC 70 04/12/2022   Lab Results  Component Value Date   TRIG 58 04/12/2022   Lab Results  Component Value Date   CHOLHDL 3.2 10/03/2021   Lab Results  Component Value Date   HGBA1C 5.7 (H) 04/12/2022       Assessment & Plan:   Problem List Items Addressed This Visit   None    No orders of the defined types were placed in this encounter.   I, Daniel Reeves [place], personally preformed the services described in this documentation.  All medical record entries made by the scribe were at my direction and in my presence.  I have reviewed the chart and discharge instructions (if applicable) and agree that the record reflects my personal performance and is accurate and complete. 06/20/2022   I,Daniel Powers,acting as a scribe for Ann Held, DO.,have documented all relevant documentation on the behalf of Ann Held, DO,as directed by  Ann Held, DO while in the presence of Ann Held, DO.   Daniel [employer]

## 2022-06-27 ENCOUNTER — Other Ambulatory Visit: Payer: Self-pay | Admitting: Family Medicine

## 2022-06-27 DIAGNOSIS — I10 Essential (primary) hypertension: Secondary | ICD-10-CM

## 2022-06-28 ENCOUNTER — Ambulatory Visit (INDEPENDENT_AMBULATORY_CARE_PROVIDER_SITE_OTHER): Payer: Medicare HMO | Admitting: Family Medicine

## 2022-07-27 ENCOUNTER — Ambulatory Visit (INDEPENDENT_AMBULATORY_CARE_PROVIDER_SITE_OTHER): Payer: Medicare HMO | Admitting: Family Medicine

## 2022-07-27 ENCOUNTER — Encounter (INDEPENDENT_AMBULATORY_CARE_PROVIDER_SITE_OTHER): Payer: Self-pay | Admitting: Family Medicine

## 2022-07-27 VITALS — BP 138/83 | HR 69 | Temp 98.7°F | Ht 71.0 in | Wt 291.8 lb

## 2022-07-27 DIAGNOSIS — Z6841 Body Mass Index (BMI) 40.0 and over, adult: Secondary | ICD-10-CM

## 2022-07-27 DIAGNOSIS — E559 Vitamin D deficiency, unspecified: Secondary | ICD-10-CM | POA: Diagnosis not present

## 2022-07-27 DIAGNOSIS — E1159 Type 2 diabetes mellitus with other circulatory complications: Secondary | ICD-10-CM

## 2022-07-27 DIAGNOSIS — E785 Hyperlipidemia, unspecified: Secondary | ICD-10-CM | POA: Diagnosis not present

## 2022-07-27 DIAGNOSIS — I152 Hypertension secondary to endocrine disorders: Secondary | ICD-10-CM | POA: Diagnosis not present

## 2022-07-27 DIAGNOSIS — E669 Obesity, unspecified: Secondary | ICD-10-CM

## 2022-07-27 DIAGNOSIS — E1169 Type 2 diabetes mellitus with other specified complication: Secondary | ICD-10-CM | POA: Diagnosis not present

## 2022-08-01 ENCOUNTER — Encounter: Payer: Self-pay | Admitting: Gastroenterology

## 2022-08-09 ENCOUNTER — Other Ambulatory Visit: Payer: Self-pay | Admitting: Family Medicine

## 2022-08-09 DIAGNOSIS — E785 Hyperlipidemia, unspecified: Secondary | ICD-10-CM

## 2022-08-09 DIAGNOSIS — I10 Essential (primary) hypertension: Secondary | ICD-10-CM

## 2022-08-13 NOTE — Progress Notes (Unsigned)
Chief Complaint:   OBESITY Daniel Powers is here to discuss his progress with his obesity treatment plan along with follow-up of his obesity related diagnoses. Daniel Powers is on the Category 2 Plan and states he is following his eating plan approximately 0% of the time. Daniel Powers states he is not currently exercising.  Today's visit was #: 45 Starting weight: 309 lbs Starting date: 12/25/2018 Today's weight: 291 lbs Today's date: 07/27/2022 Total lbs lost to date: 18 Total lbs lost since last in-office visit: +1  Interim History: Daniel Powers spent the holidays in [place] with family. He only gained 1 lb and meal plan is going well.  Subjective:   1. Type 2 diabetes mellitus with obesity ([place]) Daniel Powers is not checking his blood sugar. He has no symptoms or concerns and is not on medication.  2. Vitamin D deficiency Vitamin D level at goal. Pt is taking OTC Vitamin D 4,000 IU daily.  3. Hyperlipidemia associated with type 2 diabetes mellitus ([place]) Pt with history of blood clots, even in late teens. He is on Eliquis, Lipitor, and Omega 3.  4. Hypertension associated with type 2 diabetes mellitus ([place]) Asymptomatic. Daniel Powers denies headaches, visual changes, chest pressure. He gets little to no exercise. Pt still works as an Mudlogger.  Assessment/Plan:  No orders of the defined types were placed in this encounter.   There are no discontinued medications.   No orders of the defined types were placed in this encounter.    1. Type 2 diabetes mellitus with obesity ([place]) A1c last checked and is stable at 5.7. Patient prefers no change to meds, if possible. Pt provided with the names of Ozempic and Mounjaro, so the he can read about them to aid with weight loss.  2. Vitamin D deficiency Increase weight bearing activity. Start walking. Recheck Vitamin D level in the near future.  3. Hyperlipidemia associated with type 2 diabetes mellitus ([place]) Continue meds per PCP/specialists.   4.  Hypertension associated with type 2 diabetes mellitus (HCC) BP at goal. Continue Norvasc and Benicar.  5. Obesity with current BMI 40.7 [name] is currently in the action stage of change. As such, his goal is to continue with weight loss efforts. He has agreed to the Category 2 Plan with breakfast and lunch options.   Exercise goals: All adults should avoid inactivity. Some physical activity is better than none, and adults who participate in any amount of physical activity gain some health benefits.  Behavioral modification strategies: increasing lean protein intake and decreasing simple carbohydrates.  Daniel Powers has agreed to follow-up with our clinic in 4 weeks. He was informed of the importance of frequent follow-up visits to maximize his success with intensive lifestyle modifications for his multiple health conditions.   Objective:   Blood pressure 138/83, pulse 69, temperature 98.7 F (37.1 C), height '5\' 11"'$  (1.803 m), weight 291 lb 12.8 oz (132.4 kg), SpO2 98 %. Body mass index is 40.7 kg/m.  General: Cooperative, alert, well developed, in no acute distress. HEENT: Conjunctivae and lids unremarkable. Cardiovascular: Regular rhythm.  Lungs: Normal work of breathing. Neurologic: No focal deficits.   Lab Results  Component Value Date   CREATININE 0.77 04/12/2022   BUN 17 04/12/2022   NA 140 04/12/2022   K 4.1 04/12/2022   CL 104 04/12/2022   CO2 22 04/12/2022   Lab Results  Component Value Date   ALT 12 04/12/2022   AST 13 04/12/2022   ALKPHOS 47 04/12/2022   BILITOT 0.8 04/12/2022  Lab Results  Component Value Date   HGBA1C 5.7 (H) 04/12/2022   HGBA1C 5.7 (H) 10/03/2021   HGBA1C 5.8 (H) 04/18/2021   HGBA1C 5.7 (H) 09/30/2020   HGBA1C 5.9 (H) 05/31/2020   Lab Results  Component Value Date   INSULIN 9.6 04/12/2022   INSULIN 11.0 10/03/2021   INSULIN 7.6 09/30/2020   INSULIN 11.1 05/31/2020   INSULIN 6.7 02/04/2020   Lab Results  Component Value Date   TSH  0.864 04/12/2022   Lab Results  Component Value Date   CHOL 128 04/12/2022   HDL 45 04/12/2022   LDLCALC 70 04/12/2022   TRIG 58 04/12/2022   CHOLHDL 3.2 10/03/2021   Lab Results  Component Value Date   VD25OH 75.0 04/12/2022   VD25OH 69.7 10/03/2021   VD25OH 72.7 04/18/2021   Lab Results  Component Value Date   WBC 6.2 10/03/2021   HGB 14.5 10/03/2021   HCT 43.8 10/03/2021   MCV 86 10/03/2021   PLT 191 10/03/2021    Attestation Statements:   Reviewed by clinician on day of visit: allergies, medications, problem list, medical history, surgical history, family history, social history, and previous encounter notes.  Time spent on visit including pre-visit chart review and post-visit care and charting was 30 minutes.   I, Kathlene November, BS, CMA, am acting as transcriptionist for [employer], DO.  I have reviewed the above documentation for accuracy and completeness, and I agree with the above. Marjory Sneddon, D.O.  The [place] was signed into law in 2016 which includes the topic of electronic health records.  This provides immediate access to information in MyChart.  This includes consultation notes, operative notes, office notes, lab results and pathology reports.  If you have any questions about what you read please let us know at your next visit so we can discuss your concerns and take corrective action if need be.  We are right here with you.

## 2022-08-31 ENCOUNTER — Ambulatory Visit (INDEPENDENT_AMBULATORY_CARE_PROVIDER_SITE_OTHER): Payer: Medicare HMO | Admitting: Family Medicine

## 2022-09-05 ENCOUNTER — Ambulatory Visit (INDEPENDENT_AMBULATORY_CARE_PROVIDER_SITE_OTHER): Payer: Medicare HMO | Admitting: Family Medicine

## 2022-09-05 ENCOUNTER — Encounter (INDEPENDENT_AMBULATORY_CARE_PROVIDER_SITE_OTHER): Payer: Self-pay | Admitting: Family Medicine

## 2022-09-05 VITALS — BP 148/81 | HR 76 | Temp 98.5°F | Ht 71.0 in | Wt 292.0 lb

## 2022-09-05 DIAGNOSIS — E538 Deficiency of other specified B group vitamins: Secondary | ICD-10-CM | POA: Diagnosis not present

## 2022-09-05 DIAGNOSIS — E559 Vitamin D deficiency, unspecified: Secondary | ICD-10-CM

## 2022-09-05 DIAGNOSIS — E1169 Type 2 diabetes mellitus with other specified complication: Secondary | ICD-10-CM | POA: Diagnosis not present

## 2022-09-05 DIAGNOSIS — E669 Obesity, unspecified: Secondary | ICD-10-CM

## 2022-09-05 DIAGNOSIS — Z6841 Body Mass Index (BMI) 40.0 and over, adult: Secondary | ICD-10-CM | POA: Diagnosis not present

## 2022-09-07 DIAGNOSIS — Z6841 Body Mass Index (BMI) 40.0 and over, adult: Secondary | ICD-10-CM | POA: Insufficient documentation

## 2022-09-18 NOTE — Progress Notes (Signed)
Chief Complaint:   OBESITY Daniel Powers is here to discuss his progress with his obesity treatment plan along with follow-up of his obesity related diagnoses. Daniel Powers is on the Category 2 Plan with breakfast and lunch options and states he is following his eating plan approximately 25% of the time. Daniel Powers states he is doing little.   Today's visit was #: 7 Starting weight: 309 lbs Starting date: 12/25/2018 Today's weight: 292 lbs Today's date: 09/05/2022 Total lbs lost to date: 17 lbs Total lbs lost since last in-office visit: +1 lb  Interim History: Patient has been eating more fruits and veggies and has cut back on potatoes, rice etc.  Trying to avoid cookies but has not been as good as hoped.  Subjective:   1. Type 2 diabetes mellitus with obesity (Daniel Powers) A1c 5.7 about 4 months ago.  Patient is not taking any medication.  2. B12 deficiency B12 level 46 about 3 to 4 months ago.  Patient takes a daily supplement, unknown dose.  3. Vitamin D deficiency Patient is taking vitamin D 4000 IU daily OTC.  No symptoms or concerns, OTC medication compliance is good.  Assessment/Plan:  No orders of the defined types were placed in this encounter.   There are no discontinued medications.   No orders of the defined types were placed in this encounter.    1. Type 2 diabetes mellitus with obesity (Daniel Powers) Patient declines medication.  Check A1c at next office visit.  2. B12 deficiency Vitamin B12 not at goal continue PNP and supplements.  Patient will let us know the dose of his OTC B12 at next office visit.  3. Vitamin D deficiency Check vitamin D level at next office visit.  Continue weight loss.  4. BMI 40.0-44.9, adult (Daniel Powers)-current bmi 40.8  5. Morbid obesity (Daniel Powers)-start bmi 43.10 Daniel Powers is currently in the action stage of change. As such, his goal is to continue with weight loss efforts. He has agreed to the Category 2 Plan for picking lunch options.   Exercise goals: All  adults should avoid inactivity. Some physical activity is better than none, and adults who participate in any amount of physical activity gain some health benefits.  Behavioral modification strategies: increasing lean protein intake, decreasing simple carbohydrates, and better snacking choices.  Daniel Powers has agreed to follow-up with our clinic in 3-4 weeks. He was informed of the importance of frequent follow-up visits to maximize his success with intensive lifestyle modifications for his multiple health conditions.   Objective:   Blood pressure (!) 148/81, pulse 76, temperature 98.5 F (36.9 C), height '5\' 11"'$  (1.803 m), weight 292 lb (132.5 kg), SpO2 96 %. Body mass index is 40.73 kg/m.  General: Cooperative, alert, well developed, in no acute distress. HEENT: Conjunctivae and lids unremarkable. Cardiovascular: Regular rhythm.  Lungs: Normal work of breathing. Neurologic: No focal deficits.   Lab Results  Component Value Date   CREATININE 0.77 04/12/2022   BUN 17 04/12/2022   NA 140 04/12/2022   K 4.1 04/12/2022   CL 104 04/12/2022   CO2 22 04/12/2022   Lab Results  Component Value Date   ALT 12 04/12/2022   AST 13 04/12/2022   ALKPHOS 47 04/12/2022   BILITOT 0.8 04/12/2022   Lab Results  Component Value Date   HGBA1C 5.7 (H) 04/12/2022   HGBA1C 5.7 (H) 10/03/2021   HGBA1C 5.8 (H) 04/18/2021   HGBA1C 5.7 (H) 09/30/2020   HGBA1C 5.9 (H) 05/31/2020   Lab Results  Component  Value Date   INSULIN 9.6 04/12/2022   INSULIN 11.0 10/03/2021   INSULIN 7.6 09/30/2020   INSULIN 11.1 05/31/2020   INSULIN 6.7 02/04/2020   Lab Results  Component Value Date   TSH 0.864 04/12/2022   Lab Results  Component Value Date   CHOL 128 04/12/2022   HDL 45 04/12/2022   LDLCALC 70 04/12/2022   TRIG 58 04/12/2022   CHOLHDL 3.2 10/03/2021   Lab Results  Component Value Date   VD25OH 75.0 04/12/2022   VD25OH 69.7 10/03/2021   VD25OH 72.7 04/18/2021   Lab Results  Component Value  Date   WBC 6.2 10/03/2021   HGB 14.5 10/03/2021   HCT 43.8 10/03/2021   MCV 86 10/03/2021   PLT 191 10/03/2021   No results found for: "IRON", "TIBC", "FERRITIN"  Attestation Statements:   Reviewed by clinician on day of visit: allergies, medications, problem list, medical history, surgical history, family history, social history, and previous encounter notes.  I, Davy Pique, RMA, am acting as Location manager for [employer], DO.   I have reviewed the above documentation for accuracy and completeness, and I agree with the above. Marjory Sneddon, D.O.  The [place] was signed into law in 2016 which includes the topic of electronic health records.  This provides immediate access to information in MyChart.  This includes consultation notes, operative notes, office notes, lab results and pathology reports.  If you have any questions about what you read please let us know at your next visit so we can discuss your concerns and take corrective action if need be.  We are right here with you.

## 2022-09-20 ENCOUNTER — Other Ambulatory Visit: Payer: Self-pay | Admitting: Family Medicine

## 2022-09-20 DIAGNOSIS — I10 Essential (primary) hypertension: Secondary | ICD-10-CM

## 2022-10-05 ENCOUNTER — Encounter (INDEPENDENT_AMBULATORY_CARE_PROVIDER_SITE_OTHER): Payer: Self-pay | Admitting: Family Medicine

## 2022-10-05 ENCOUNTER — Ambulatory Visit (INDEPENDENT_AMBULATORY_CARE_PROVIDER_SITE_OTHER): Payer: Medicare HMO | Admitting: Family Medicine

## 2022-10-05 VITALS — BP 146/79 | HR 73 | Temp 98.5°F | Ht 71.0 in | Wt 292.6 lb

## 2022-10-05 DIAGNOSIS — E1169 Type 2 diabetes mellitus with other specified complication: Secondary | ICD-10-CM

## 2022-10-05 DIAGNOSIS — E559 Vitamin D deficiency, unspecified: Secondary | ICD-10-CM

## 2022-10-05 DIAGNOSIS — Z6841 Body Mass Index (BMI) 40.0 and over, adult: Secondary | ICD-10-CM

## 2022-10-05 DIAGNOSIS — I152 Hypertension secondary to endocrine disorders: Secondary | ICD-10-CM | POA: Diagnosis not present

## 2022-10-05 DIAGNOSIS — E669 Obesity, unspecified: Secondary | ICD-10-CM | POA: Diagnosis not present

## 2022-10-05 DIAGNOSIS — E1159 Type 2 diabetes mellitus with other circulatory complications: Secondary | ICD-10-CM

## 2022-10-05 DIAGNOSIS — E538 Deficiency of other specified B group vitamins: Secondary | ICD-10-CM | POA: Diagnosis not present

## 2022-10-05 MED ORDER — VITAMIN D3 50 MCG (2000 UT) PO CAPS: 4000.0000 [IU] | ORAL_CAPSULE | Freq: Every day | ORAL | 0 refills | Status: DC

## 2022-10-05 MED ORDER — CYANOCOBALAMIN 500 MCG PO TABS: 500.0000 ug | ORAL_TABLET | Freq: Every day | ORAL | Status: DC

## 2022-10-05 NOTE — Progress Notes (Signed)
Marjory Sneddon, D.O.  ABFM, ABOM Specializing in Clinical Bariatric Medicine  Office located at: [address], [place]  [address]     Assessment and Plan:   Orders Placed This Encounter  Procedures   Hemoglobin A1c   VITAMIN D 25 Hydroxy (Vit-D Deficiency, Fractures)   Vitamin B12    Medications Discontinued During This Encounter  Medication Reason   Cholecalciferol (VITAMIN D3) 50 MCG (2000 UT) capsule Reorder     Meds ordered this encounter  Medications   cyanocobalamin (VITAMIN B12) 500 MCG tablet    Sig: Take 1 tablet (500 mcg total) by mouth daily.   Cholecalciferol (VITAMIN D3) 50 MCG (2000 UT) CAPS    Sig: Take 2 capsules (4,000 Units total) by mouth daily.    Dispense:  30 capsule    Refill:  0     Type 2 diabetes mellitus with obesity (HCC)  Assessment: Condition is Improving, but not optimized..  Lab Results  Component Value Date   HGBA1C 5.7 (H) 04/12/2022   HGBA1C 5.7 (H) 10/03/2021   HGBA1C 5.8 (H) 04/18/2021   INSULIN 9.6 04/12/2022   INSULIN 11.0 10/03/2021   INSULIN 7.6 09/30/2020  He is currently not on any medication for his Type 2 Diabetes mellitus with obesity. This is managed with nutritional plan.   Plan: Recheck A1c today - He declines GLP and metformin for diabetes/weight loss at the moment  - Jakyri will continue to work on weight loss via their meal plan we devised to help decrease the risk of progressing to diabetes.    Vitamin D deficiency  Assessment: Condition is Controlled.  Lab Results  Component Value Date   VD25OH 75.0 04/12/2022   VD25OH 69.7 10/03/2021   VD25OH 72.7 04/18/2021  Patient is compliant with Cholecalciferol 4K IU daily. Denies any side effects.   Plan: Recheck. Continue with Cholecalciferol 4K IU daily.  - weight loss will likely improve availability of vitamin D, thus encouraged Laine to continue with meal plan and their weight loss efforts to further improve this condition.  Thus, we will  need to monitor levels regularly (every 3-4 mo on average) to keep levels within normal limits and prevent over supplementation.   B12 deficiency Assessment: Condition is Not optimized..  Lab Results  Component Value Date   VITAMINB12 361 04/12/2022  He has been taking OTC B12 500mg  daily for the last 4 months.   Plan: Recheck today. Continue with OTC B12 500 mg daily.   Continue his prudent nutritional plan and continue to advance exercise and cardiovascular fitness as tolerated.    Hypertension associated with Type 2 Diabetes mellitus   Assessment: Condition is At goal.  At home tends to be well controlled at home Last 3 blood pressure readings in our office are as follows: BP Readings from Last 3 Encounters:  10/05/22 (!) 146/79  09/05/22 (!) 148/81  07/27/22 138/83  His blood pressure is high likely due to white coat syndrome. At home when he checks it is in the 120s-130s/60s-70s, well controlled. He is compliant with Norvasc 5 mg daily and Benicar 40 mg daily.    Plan: Continue with Norvasc 5 mg daily and Benicar 40 mg daily as recommended PCP/specialist - We will continue to monitor symptoms as they relate to the his weight loss journey.    TREATMENT PLAN FOR OBESITY: BMI 40.0-44.9, adult (HCC)-current bmi 40.8 Morbid obesity (HCC)-start bmi 43.10/DATE 12/25/2018 Assessment: Condition is Improving, but not optimized.. Biometric data collected  today, was reviewed with patient.  Fat mass has decreased by 1.8lb. Muscle mass has increased by 1.8lb. Total body water has increased by .2lb.   Plan: Continue with Category 2 meal plan with lunch options . I extensively counseled him on the importance of focusing on himself for weight loss.   Behavioral Intervention Additional resources provided today: breakfast options, lunch options, and category 2 meal plan information Evidence-based interventions for health behavior change were utilized today including the discussion of self  monitoring techniques, problem-solving barriers and SMART goal setting techniques.   Regarding patient's less desirable eating habits and patterns, we employed the technique of small changes.  Pt will specifically work on: eating 300 calories for lunch (it is okay if he has two protein shakes)  for next visit.    Recommended Physical Activity Goals Baboucarr has been advised to work up to 150 minutes of moderate intensity aerobic activity a week and strengthening exercises 2-3 times per week for cardiovascular health, weight loss maintenance and preservation of muscle mass.  He has agreed to Continue current level of physical activity   FOLLOW UP: Return in about 4 weeks (around 11/02/2022). He was informed of the importance of frequent follow up visits to maximize his success with intensive lifestyle modifications for his multiple health conditions. [name] is aware that we will review all of his lab results at our next visit.  He is aware that if anything is critical/ life threatening with the results, we will be contacting him via [place] prior to the office visit to discuss management.    Subjective:   Chief complaint: Obesity Terelle is here to discuss his progress with his obesity treatment plan. He is on the the Category 2 Plan with lunch options and states he is following his eating plan approximately 50% of the time. He states he is not exercising.   Interval History:  JASTON BARTOLOMUCCI is here for a follow up office visit. We reviewed his meal plan and all questions were answered. Patient's food recall appears to be accurate and consistent with what is on plan when he is following it. When eating on plan, his hunger and cravings are well controlled.     Since last office visit he has been following his meal plan well. He is making sure he gets enough protein and tries to avoid carbs. Sometimes he skips lunch due to busy schedule. This last month, has not had problems with hungers/cravings.     Review of Systems:  Pertinent positives were addressed with patient today.  Weight Summary and Biometrics   Weight Lost Since Last Visit: 0  Weight Gained Since Last Visit: 0    Vitals Temp: 98.5 F (36.9 C) BP: (!) 146/79 Pulse Rate: 73 SpO2: 98 %   Anthropometric Measurements Height: 5\' 11"  (1.803 m) Weight: 292 lb 9.6 oz (132.7 kg) BMI (Calculated): 40.83 Weight at Last Visit: 292LB Weight Lost Since Last Visit: 0 Weight Gained Since Last Visit: 0 Starting Weight: 309 Total Weight Loss (lbs): 17 lb (7.711 kg) Peak Weight: 340lb   Body Composition  Body Fat %: 36.7 % Fat Mass (lbs): 107.4 lbs Muscle Mass (lbs): 176.2 lbs Total Body Water (lbs): 130.2 lbs Visceral Fat Rating : 25   Other Clinical Data Fasting: yes Labs: yes Today's Visit #: 15 Starting Date: 12/25/18    Objective:   PHYSICAL EXAM:  Blood pressure (!) 146/79, pulse 73, temperature 98.5 F (36.9 C), height 5\' 11"  (1.803 m), weight 292  lb 9.6 oz (132.7 kg), SpO2 98 %. Body mass index is 40.81 kg/m.  General: Well Developed, well nourished, and in no acute distress.  HEENT: Normocephalic, atraumatic Skin: Warm and dry, cap RF less 2 sec, good turgor Chest:  Normal excursion, shape, no gross abn Respiratory: speaking in full sentences, no conversational dyspnea NeuroM-Sk: Ambulates w/o assistance, moves * 4 Psych: A and O *3, insight good, mood-full  DIAGNOSTIC DATA REVIEWED:  BMET    Component Value Date/Time   NA 140 04/12/2022 0952   K 4.1 04/12/2022 0952   CL 104 04/12/2022 0952   CO2 22 04/12/2022 0952   GLUCOSE 85 04/12/2022 0952   GLUCOSE 75 09/16/2019 1350   GLUCOSE 85 05/07/2006 1230   BUN 17 04/12/2022 0952   CREATININE 0.77 04/12/2022 0952   CREATININE 0.85 08/01/2013 1250   CALCIUM 9.3 04/12/2022 0952   GFRNONAA 90 05/31/2020 0912   GFRAA 104 05/31/2020 0912   Lab Results  Component Value Date   HGBA1C 5.7 (H) 04/12/2022   HGBA1C 6.2 02/01/2018   Lab  Results  Component Value Date   INSULIN 9.6 04/12/2022   INSULIN 9.5 12/25/2018   Lab Results  Component Value Date   TSH 0.864 04/12/2022   CBC    Component Value Date/Time   WBC 6.2 10/03/2021 0749   WBC 7.9 06/28/2017 1612   RBC 5.10 10/03/2021 0749   RBC 5.31 06/28/2017 1612   HGB 14.5 10/03/2021 0749   HGB 12.7 (L) 10/10/2010 0935   HCT 43.8 10/03/2021 0749   HCT 39.2 10/10/2010 0935   PLT 191 10/03/2021 0749   MCV 86 10/03/2021 0749   MCV 77 (L) 10/10/2010 0935   MCH 28.4 10/03/2021 0749   MCH 27.9 09/09/2016 1436   MCH 24.9 (L) 10/10/2010 0935   MCH 26.8 02/28/2010 2240   MCHC 33.1 10/03/2021 0749   MCHC 32.5 06/28/2017 1612   RDW 13.5 10/03/2021 0749   RDW 16.3 (H) 10/10/2010 0935   Iron Studies No results found for: "IRON", "TIBC", "FERRITIN", "IRONPCTSAT" Lipid Panel     Component Value Date/Time   CHOL 128 04/12/2022 0952   TRIG 58 04/12/2022 0952   HDL 45 04/12/2022 0952   CHOLHDL 3.2 10/03/2021 0749   CHOLHDL 3 12/06/2020 0912   VLDL 9.8 12/06/2020 0912   LDLCALC 70 04/12/2022 0952   Hepatic Function Panel     Component Value Date/Time   PROT 6.9 04/12/2022 0952   ALBUMIN 4.2 04/12/2022 0952   AST 13 04/12/2022 0952   ALT 12 04/12/2022 0952   ALKPHOS 47 04/12/2022 0952   BILITOT 0.8 04/12/2022 0952   BILIDIR 0.1 11/26/2014 1049      Component Value Date/Time   TSH 0.864 04/12/2022 0952   Nutritional Lab Results  Component Value Date   VD25OH 75.0 04/12/2022   VD25OH 69.7 10/03/2021   VD25OH 72.7 04/18/2021    Attestations:   Reviewed by clinician on day of visit: allergies, medications, problem list, medical history, surgical history, family history, social history, and previous encounter notes.   Patient was in the office today and time spent on visit including pre-visit chart review and post-visit care/coordination of care and electronic medical record documentation was 40 minutes. 50% of the time was in face to face counseling of  this patient's medical condition(s) and providing education on treatment options to include the first-line treatment of diet and lifestyle modification.   I,Special Puri,acting as a scribe for [employer], DO.,have documented all relevant documentation  on the behalf of Mellody Dance, DO,as directed by  Mellody Dance, DO while in the presence of Mellody Dance, DO.   I, Mellody Dance, DO, have reviewed all documentation for this visit. The documentation on 10/05/22 for the exam, diagnosis, procedures, and orders are all accurate and complete.

## 2022-10-06 LAB — VITAMIN B12: Vitamin B-12: 869 pg/mL (ref 232–1245)

## 2022-10-06 LAB — VITAMIN D 25 HYDROXY (VIT D DEFICIENCY, FRACTURES): Vit D, 25-Hydroxy: 74.2 ng/mL (ref 30.0–100.0)

## 2022-10-06 LAB — HEMOGLOBIN A1C
Est. average glucose Bld gHb Est-mCnc: 126 mg/dL
Hgb A1c MFr Bld: 6 % — ABNORMAL HIGH (ref 4.8–5.6)

## 2022-10-17 DIAGNOSIS — H2513 Age-related nuclear cataract, bilateral: Secondary | ICD-10-CM | POA: Diagnosis not present

## 2022-10-17 DIAGNOSIS — E113293 Type 2 diabetes mellitus with mild nonproliferative diabetic retinopathy without macular edema, bilateral: Secondary | ICD-10-CM | POA: Diagnosis not present

## 2022-10-20 ENCOUNTER — Other Ambulatory Visit: Payer: Self-pay | Admitting: Family Medicine

## 2022-10-20 DIAGNOSIS — I82401 Acute embolism and thrombosis of unspecified deep veins of right lower extremity: Secondary | ICD-10-CM

## 2022-11-02 ENCOUNTER — Ambulatory Visit (INDEPENDENT_AMBULATORY_CARE_PROVIDER_SITE_OTHER): Payer: Medicare HMO | Admitting: Family Medicine

## 2022-11-02 ENCOUNTER — Encounter (INDEPENDENT_AMBULATORY_CARE_PROVIDER_SITE_OTHER): Payer: Self-pay | Admitting: Family Medicine

## 2022-11-02 VITALS — BP 125/75 | HR 74 | Temp 98.3°F | Ht 71.0 in | Wt 294.6 lb

## 2022-11-02 DIAGNOSIS — E559 Vitamin D deficiency, unspecified: Secondary | ICD-10-CM

## 2022-11-02 DIAGNOSIS — Z6841 Body Mass Index (BMI) 40.0 and over, adult: Secondary | ICD-10-CM

## 2022-11-02 DIAGNOSIS — E538 Deficiency of other specified B group vitamins: Secondary | ICD-10-CM | POA: Diagnosis not present

## 2022-11-02 DIAGNOSIS — E1169 Type 2 diabetes mellitus with other specified complication: Secondary | ICD-10-CM | POA: Diagnosis not present

## 2022-11-02 NOTE — Progress Notes (Signed)
[name], D.O.  ABFM, ABOM Specializing in Clinical Bariatric Medicine  Office located at: [address] [place], [place]  [address]     Assessment and Plan:   Type 2 diabetes mellitus with obesity Assessment: Condition is Worsening. Last OV labs were obtained and today, labs were reviewed with pt.  Lab Results  Component Value Date   HGBA1C 6.0 (H) 10/05/2022   HGBA1C 5.7 (H) 04/12/2022   HGBA1C 5.7 (H) 10/03/2021   INSULIN 9.6 04/12/2022   INSULIN 11.0 10/03/2021   INSULIN 7.6 09/30/2020  He is currently not on any medication for his Type 2 Diabetes mellitus with obesity. This is managed with nutritional plan.   Plan: Continue to manage with nutritional plan and start exercise. We discussed starting ozempic, we reviewed the cardio vascular benefits of starting ozempic as well as wt loss. Patient declined starting this office visit, will consider it next time.  Patient denies a personal history of pancreatitis;and denies a family history of medullary thyroid carcinoma or multiple endocrine neoplasia type II.  - Reminded Daniel Powers if he feels poorly- check Blood Sugar and Blood Pressure at that time.    - Hypoglycemia prevention discussed with the patient.  Eat on a regular basis- no skipping or going long periods without eating.     - Recommend that any concerns about medicines should be directed at the prescribing provider   Vitamin D deficiency Assessment: Condition is At goal.. Labs were reviewed. Patient is compliant with Cholecalciferol 4K IU daily. Denies any side effects.  Lab Results  Component Value Date   VD25OH 74.2 10/05/2022   VD25OH 75.0 04/12/2022   VD25OH 69.7 10/03/2021    Plan: Continue with Cholecalciferol OTC but decrease dose to 3,000 IU QD - esp since we are going into summer - It has been show that administration of vitamin D supplementation leads to improved satiety and a decrease in inflammatory markers.  Hence, low Vitamin D  levels may be linked to an increased risk of cardiovascular events and even increased risk of cancers- such as colon and breast. - weight loss will likely improve availability of vitamin D, thus encouraged Daniel Powers to continue with meal plan and their weight loss efforts to further improve this condition.  Thus, we will need to monitor levels regularly (every 3-4 mo on average) to keep levels within normal limits and prevent over supplementation. - pt's questions and concerns regarding this condition addressed.     B12 deficiency Assessment: Condition is Improving, optimized.. Labs were reviewed. He has been taking OTC B12 500mg  daily  Lab Results  Component Value Date   VITAMINB12 869 10/05/2022    Plan:Continue OTC B12 500mg  daily and prudent nutritional meal plan.    TREATMENT PLAN FOR OBESITY: BMI 40.0-44.9, adult (HCC)-current bmi 41.1 Morbid obesity (HCC)-start bmi 43.10/DATE 12/25/2018 Assessment: Condition is not optimized. Biometric data collected today, was reviewed with patient.  Fat mass has increased by 1.6lb. Muscle mass has increased by 0.2lb. Total body water has increased by 2.2lb.   Plan:  Daniel Powers is currently in the action stage of change. As such, his goal is to continue weight management plan. Daniel Powers will work on healthier eating habits and try their best to follow the Continue category 2 meal plan information and lunch options  best they can.   Behavioral Intervention Additional resources provided today: category 2 meal plan information and lunch options Evidence-based interventions for health behavior change were utilized today including the discussion  of self monitoring techniques, problem-solving barriers and SMART goal setting techniques.   Regarding patient's less desirable eating habits and patterns, we employed the technique of small changes.  Pt will specifically work on: START exercise by walking 20-30 every other day for next visit.    Recommended Physical  Activity Goals Kivon has been advised to work up to 150 minutes of moderate intensity aerobic activity a week and strengthening exercises 2-3 times per week for cardiovascular health, weight loss maintenance and preservation of muscle mass.  He has agreed to Think about ways to increase physical activity   FOLLOW UP: Return in about 4 weeks (around 11/30/2022).   He was informed of the importance of frequent follow up visits to maximize his success with intensive lifestyle modifications for his multiple health conditions.   Subjective:   Chief complaint: Obesity Daniel Powers is here to discuss his progress with his obesity treatment plan. He is on the the Category 2 Plan with lunch options and states he is following his eating plan approximately 75% of the time. He states he is not exercising.  Interval History:  Daniel Powers is here for a follow up office visit. Since last office visit he reports feeling stressed.   He states that he was skipping meals but incorporating protein shakes.  He states that he wasn't mindful of his food choices lately.     Pharmacotherapy for weight loss: He is not currently taking. We discussed starting ozempic, we reviewed the cardio vascular benefits of starting ozempic. Patient declined starting this office visit, will consider it next time.  Patient denies a personal history of pancreatitis;and denies a family history of medullary thyroid carcinoma or multiple endocrine neoplasia type II.    Review of Systems:  Pertinent positives were addressed with patient today.   Weight Summary and Biometrics   Weight Lost Since Last Visit: 0  Weight Gained Since Last Visit: 2lb   Vitals Temp: 98.3 F (36.8 C) BP: 125/75 Pulse Rate: 74 SpO2: 99 %   Anthropometric Measurements Height: 5\' 11"  (1.803 m) Weight: 294 lb 9.6 oz (133.6 kg) BMI (Calculated): 41.11 Weight at Last Visit: 292lb Weight Lost Since Last Visit: 0 Weight Gained Since Last Visit:  2lb Starting Weight: 309lb Total Weight Loss (lbs): 15 lb (6.804 kg) Peak Weight: 340lb   Body Composition  Body Fat %: 37.1 % Fat Mass (lbs): 109.2 lbs Muscle Mass (lbs): 176.4 lbs Total Body Water (lbs): 132.4 lbs Visceral Fat Rating : 25   Other Clinical Data Fasting: yes Labs: no Today's Visit #: 49 Starting Date: 12/25/18     Objective:   PHYSICAL EXAM: Blood pressure 125/75, pulse 74, temperature 98.3 F (36.8 C), height 5\' 11"  (1.803 m), weight 294 lb 9.6 oz (133.6 kg), SpO2 99 %. Body mass index is 41.09 kg/m.  General: Well Developed, well nourished, and in no acute distress.  HEENT: Normocephalic, atraumatic Skin: Warm and dry, cap RF less 2 sec, good turgor Chest:  Normal excursion, shape, no gross abn Respiratory: speaking in full sentences, no conversational dyspnea NeuroM-Sk: Ambulates w/o assistance, moves * 4 Psych: A and O *3, insight good, mood-full  DIAGNOSTIC DATA REVIEWED:  BMET    Component Value Date/Time   NA 140 04/12/2022 0952   K 4.1 04/12/2022 0952   CL 104 04/12/2022 0952   CO2 22 04/12/2022 0952   GLUCOSE 85 04/12/2022 0952   GLUCOSE 75 09/16/2019 1350   GLUCOSE 85 05/07/2006 1230   BUN 17 04/12/2022 0952  CREATININE 0.77 04/12/2022 0952   CREATININE 0.85 08/01/2013 1250   CALCIUM 9.3 04/12/2022 0952   GFRNONAA 90 05/31/2020 0912   GFRAA 104 05/31/2020 0912   Lab Results  Component Value Date   HGBA1C 6.0 (H) 10/05/2022   HGBA1C 6.2 02/01/2018   Lab Results  Component Value Date   INSULIN 9.6 04/12/2022   INSULIN 9.5 12/25/2018   Lab Results  Component Value Date   TSH 0.864 04/12/2022   CBC    Component Value Date/Time   WBC 6.2 10/03/2021 0749   WBC 7.9 06/28/2017 1612   RBC 5.10 10/03/2021 0749   RBC 5.31 06/28/2017 1612   HGB 14.5 10/03/2021 0749   HGB 12.7 (L) 10/10/2010 0935   HCT 43.8 10/03/2021 0749   HCT 39.2 10/10/2010 0935   PLT 191 10/03/2021 0749   MCV 86 10/03/2021 0749   MCV 77 (L)  10/10/2010 0935   MCH 28.4 10/03/2021 0749   MCH 27.9 09/09/2016 1436   MCH 24.9 (L) 10/10/2010 0935   MCH 26.8 02/28/2010 2240   MCHC 33.1 10/03/2021 0749   MCHC 32.5 06/28/2017 1612   RDW 13.5 10/03/2021 0749   RDW 16.3 (H) 10/10/2010 0935   Iron Studies No results found for: "IRON", "TIBC", "FERRITIN", "IRONPCTSAT" Lipid Panel     Component Value Date/Time   CHOL 128 04/12/2022 0952   TRIG 58 04/12/2022 0952   HDL 45 04/12/2022 0952   CHOLHDL 3.2 10/03/2021 0749   CHOLHDL 3 12/06/2020 0912   VLDL 9.8 12/06/2020 0912   LDLCALC 70 04/12/2022 0952   Hepatic Function Panel     Component Value Date/Time   PROT 6.9 04/12/2022 0952   ALBUMIN 4.2 04/12/2022 0952   AST 13 04/12/2022 0952   ALT 12 04/12/2022 0952   ALKPHOS 47 04/12/2022 0952   BILITOT 0.8 04/12/2022 0952   BILIDIR 0.1 11/26/2014 1049      Component Value Date/Time   TSH 0.864 04/12/2022 0952   Nutritional Lab Results  Component Value Date   VD25OH 74.2 10/05/2022   VD25OH 75.0 04/12/2022   VD25OH 69.7 10/03/2021    Attestations:   Reviewed by clinician on day of visit: allergies, medications, problem list, medical history, surgical history, family history, social history, and previous encounter notes.   Patient was in the office today and time spent on visit including pre-visit chart review and post-visit care/coordination of care and electronic medical record documentation was 40 minutes. 50% of the time was in face to face counseling of this patient's medical condition(s) and providing education on treatment options to include the first-line treatment of diet and lifestyle modification.   I,Safa M Kadhim,acting as a scribe for [employer], DO.,have documented all relevant documentation on the behalf of Thomasene Lot, DO,as directed by  Thomasene Lot, DO while in the presence of Thomasene Lot, DO.   I, Thomasene Lot, DO, have reviewed all documentation for this visit. The documentation on  11/02/22 for the exam, diagnosis, procedures, and orders are all accurate and complete.

## 2022-11-19 ENCOUNTER — Other Ambulatory Visit: Payer: Self-pay | Admitting: Family Medicine

## 2022-11-19 DIAGNOSIS — E785 Hyperlipidemia, unspecified: Secondary | ICD-10-CM

## 2022-11-30 ENCOUNTER — Ambulatory Visit (INDEPENDENT_AMBULATORY_CARE_PROVIDER_SITE_OTHER): Payer: Medicare HMO | Admitting: Family Medicine

## 2022-12-19 ENCOUNTER — Encounter (INDEPENDENT_AMBULATORY_CARE_PROVIDER_SITE_OTHER): Payer: Self-pay | Admitting: Family Medicine

## 2022-12-19 ENCOUNTER — Ambulatory Visit (INDEPENDENT_AMBULATORY_CARE_PROVIDER_SITE_OTHER): Payer: Medicare HMO | Admitting: Family Medicine

## 2022-12-19 VITALS — BP 136/80 | HR 69 | Temp 98.2°F | Ht 71.0 in | Wt 291.0 lb

## 2022-12-19 DIAGNOSIS — Z6841 Body Mass Index (BMI) 40.0 and over, adult: Secondary | ICD-10-CM

## 2022-12-19 DIAGNOSIS — E559 Vitamin D deficiency, unspecified: Secondary | ICD-10-CM | POA: Diagnosis not present

## 2022-12-19 DIAGNOSIS — E1169 Type 2 diabetes mellitus with other specified complication: Secondary | ICD-10-CM | POA: Diagnosis not present

## 2022-12-19 DIAGNOSIS — E65 Localized adiposity: Secondary | ICD-10-CM

## 2022-12-19 NOTE — Progress Notes (Signed)
[name], D.O.  ABFM, ABOM Specializing in Clinical Bariatric Medicine  Office located at: [address] [place], [place]  [address]     Assessment and Plan:   Type 2 diabetes mellitus with obesity (HCC) Assessment: Condition is not optimized. Lab Results  Component Value Date   HGBA1C 6.0 (H) 10/05/2022   HGBA1C 5.7 (H) 04/12/2022   HGBA1C 5.7 (H) 10/03/2021   INSULIN 9.6 04/12/2022   INSULIN 11.0 10/03/2021   INSULIN 7.6 09/30/2020  - Christin's diabetes mellitus is being managed without any medication. This is a diet controlled condition.  - When Abid is following the prudent nutritional plan, his hunger and cravings are stable.   Plan: - Continue his prudent nutritional plan that is low in simple carbohydrates, saturated fats and trans fats to goal of 5-10% weight loss to achieve significant health benefits.  Pt encouraged to continually advance exercise and cardiovascular fitness as tolerated throughout weight loss journey.    Visceral obesity Assessment: Condition is not optimized. Current visceral fat rating: 25.  Plan: - Counseling done on: The visceral fat rating should be < 15 in a 67 yr old male.  Visceral adipose tissue is a hormonally active component of total body fat. This body composition phenotype is associated with medical disorders such as metabolic syndrome, cardiovascular disease and several malignancies including prostate, breast, and colorectal cancers. - Goal: Lose 7-10% of starting weight.   - Limit Simple Carbs and High Fat foods- follow our meal plan.       Vitamin D deficiency Assessment: Condition is stable. Lab Results  Component Value Date   VD25OH 74.2 10/05/2022   VD25OH 75.0 04/12/2022   VD25OH 69.7 10/03/2021  - No issues with  OTC Cholecalciferol 3,000 IU QD. Denies any adverse effects.   Plan: - Continue with OTC supplement.  - weight loss will likely improve availability of vitamin D, thus encouraged Angus to  continue with meal plan and their weight loss efforts to further improve this condition.  Thus, we will need to monitor levels regularly (every 3-4 mo on average) to keep levels within normal limits and prevent over supplementation.   TREATMENT PLAN FOR OBESITY: BMI 40.0-44.9, adult (HCC)-current bmi 40.6  Morbid obesity (HCC)-start bmi 43.10/DATE 12/25/2018 Assessment:  ROGERS REGIMBAL is here to discuss his progress with his obesity treatment plan along with follow-up of his obesity related diagnoses. See Medical Weight Management Flowsheet for complete bioelectrical impedance results.  Condition is not optimized. Biometric data collected today, was reviewed with patient.   Since last office visit on 11/02/22 patient's  Muscle mass has decreased by 2.2 lb. Fat mass has decreased by 0.6 lb. Total body water has decreased by 0.2 lb.  Counseling done on how various foods will affect these numbers and how to maximize success  Total lbs lost to date: 18  Total weight loss percentage to date: 5.83   Plan:  - Continue the Category 2 meal plan with breakfast and lunch options and if he desires he can journal 1200-1300 calories and 85+ grams protein daily.   - Highly encouraged patient to review and download nutritional menus before eating out. Also discussed with pt how meals in restaurants/fast foods are typically higher in calories compared to eating at home.   Behavioral Intervention Additional resources provided today: Food journaling plan information and Eating Out Guide Evidence-based interventions for health behavior change were utilized today including the discussion of self monitoring techniques, problem-solving barriers and SMART goal  setting techniques.   Regarding patient's less desirable eating habits and patterns, we employed the technique of small changes.  Pt will specifically work on: Passenger transport manager choices when eating out and journaling his intake if he desires for next  visit.    Recommended Physical Activity Goals  Shayan has been advised to slowly work up to 150 minutes of moderate intensity aerobic activity a week and strengthening exercises 2-3 times per week for cardiovascular health, weight loss maintenance and preservation of muscle mass.   He has agreed to Continue current level of physical activity   FOLLOW UP: Return in about 4 weeks (around 01/16/2023). He was informed of the importance of frequent follow up visits to maximize his success with intensive lifestyle modifications for his multiple health conditions.   Subjective:   Chief complaint: Obesity Oswaldo is here to discuss his progress with his obesity treatment plan. He is on the the Category 2 Plan with breakfast and lunch options and states he is following his eating plan approximately 50% of the time. He states he is walking 30 minutes 2 days per week.  Interval History:  GUYTON MARESCA is here for a follow up office visit.     Since last office visit:   - Patient endorses that he is not eating all the food on the meal plan.  - He typically skips lunch due to being busy from work. If he does have lunch, he drinks 1-2 protein shakes.  - Patient states that on several occasions, he has been eating out for breakfast and dinner. He eats out at places such as [employer], [employer], [employer], etc..    Review of Systems:  Pertinent positives were addressed with patient today.  Weight Summary and Biometrics   Weight Lost Since Last Visit: 1  Weight Gained Since Last Visit: 0   Vitals Temp: 98.2 F (36.8 C) BP: 136/80 Pulse Rate: 69 SpO2: 98 %   Anthropometric Measurements Height: 5\' 11"  (1.803 m) Weight: 291 lb (132 kg) BMI (Calculated): 40.6 Weight at Last Visit: 292lb Weight Lost Since Last Visit: 1 Weight Gained Since Last Visit: 0 Starting Weight: 309lb Total Weight Loss (lbs): 16 lb (7.258 kg) Peak Weight: 340lb   Body Composition  Body Fat %: 37.2 % Fat  Mass (lbs): 108.6 lbs Muscle Mass (lbs): 174.2 lbs Total Body Water (lbs): 132.2 lbs Visceral Fat Rating : 25   Other Clinical Data Fasting: no Labs: no Today's Visit #: 50 Starting Date: 12/25/18   Objective:   PHYSICAL EXAM: Blood pressure 136/80, pulse 69, temperature 98.2 F (36.8 C), height 5\' 11"  (1.803 m), weight 291 lb (132 kg), SpO2 98 %. Body mass index is 40.59 kg/m.  General: Well Developed, well nourished, and in no acute distress.  HEENT: Normocephalic, atraumatic Skin: Warm and dry, cap RF less 2 sec, good turgor Chest:  Normal excursion, shape, no gross abn Respiratory: speaking in full sentences, no conversational dyspnea NeuroM-Sk: Ambulates w/o assistance, moves * 4 Psych: A and O *3, insight good, mood-full  DIAGNOSTIC DATA REVIEWED:  BMET    Component Value Date/Time   NA 140 04/12/2022 0952   K 4.1 04/12/2022 0952   CL 104 04/12/2022 0952   CO2 22 04/12/2022 0952   GLUCOSE 85 04/12/2022 0952   GLUCOSE 75 09/16/2019 1350   GLUCOSE 85 05/07/2006 1230   BUN 17 04/12/2022 0952   CREATININE 0.77 04/12/2022 0952   CREATININE 0.85 08/01/2013 1250   CALCIUM 9.3 04/12/2022 0952  GFRNONAA 90 05/31/2020 0912   GFRAA 104 05/31/2020 0912   Lab Results  Component Value Date   HGBA1C 6.0 (H) 10/05/2022   HGBA1C 6.2 02/01/2018   Lab Results  Component Value Date   INSULIN 9.6 04/12/2022   INSULIN 9.5 12/25/2018   Lab Results  Component Value Date   TSH 0.864 04/12/2022   CBC    Component Value Date/Time   WBC 6.2 10/03/2021 0749   WBC 7.9 06/28/2017 1612   RBC 5.10 10/03/2021 0749   RBC 5.31 06/28/2017 1612   HGB 14.5 10/03/2021 0749   HGB 12.7 (L) 10/10/2010 0935   HCT 43.8 10/03/2021 0749   HCT 39.2 10/10/2010 0935   PLT 191 10/03/2021 0749   MCV 86 10/03/2021 0749   MCV 77 (L) 10/10/2010 0935   MCH 28.4 10/03/2021 0749   MCH 27.9 09/09/2016 1436   MCH 24.9 (L) 10/10/2010 0935   MCH 26.8 02/28/2010 2240   MCHC 33.1 10/03/2021  0749   MCHC 32.5 06/28/2017 1612   RDW 13.5 10/03/2021 0749   RDW 16.3 (H) 10/10/2010 0935   Iron Studies No results found for: "IRON", "TIBC", "FERRITIN", "IRONPCTSAT" Lipid Panel     Component Value Date/Time   CHOL 128 04/12/2022 0952   TRIG 58 04/12/2022 0952   HDL 45 04/12/2022 0952   CHOLHDL 3.2 10/03/2021 0749   CHOLHDL 3 12/06/2020 0912   VLDL 9.8 12/06/2020 0912   LDLCALC 70 04/12/2022 0952   Hepatic Function Panel     Component Value Date/Time   PROT 6.9 04/12/2022 0952   ALBUMIN 4.2 04/12/2022 0952   AST 13 04/12/2022 0952   ALT 12 04/12/2022 0952   ALKPHOS 47 04/12/2022 0952   BILITOT 0.8 04/12/2022 0952   BILIDIR 0.1 11/26/2014 1049      Component Value Date/Time   TSH 0.864 04/12/2022 0952   Nutritional Lab Results  Component Value Date   VD25OH 74.2 10/05/2022   VD25OH 75.0 04/12/2022   VD25OH 69.7 10/03/2021    Attestations:   Reviewed by clinician on day of visit: allergies, medications, problem list, medical history, surgical history, family history, social history, and previous encounter notes.   I,Special Puri,acting as a Neurosurgeon for [employer], DO.,have documented all relevant documentation on the behalf of Thomasene Lot, DO,as directed by  Thomasene Lot, DO while in the presence of Thomasene Lot, DO.   I, Thomasene Lot, DO, have reviewed all documentation for this visit. The documentation on 12/19/22 for the exam, diagnosis, procedures, and orders are all accurate and complete.

## 2023-01-09 ENCOUNTER — Encounter: Payer: Self-pay | Admitting: Family Medicine

## 2023-01-09 ENCOUNTER — Ambulatory Visit (INDEPENDENT_AMBULATORY_CARE_PROVIDER_SITE_OTHER): Payer: Medicare HMO | Admitting: Family Medicine

## 2023-01-09 VITALS — BP 140/80 | HR 78 | Temp 98.3°F | Resp 18 | Ht 71.0 in | Wt 299.2 lb

## 2023-01-09 DIAGNOSIS — E785 Hyperlipidemia, unspecified: Secondary | ICD-10-CM

## 2023-01-09 DIAGNOSIS — I10 Essential (primary) hypertension: Secondary | ICD-10-CM | POA: Diagnosis not present

## 2023-01-09 DIAGNOSIS — I152 Hypertension secondary to endocrine disorders: Secondary | ICD-10-CM

## 2023-01-09 DIAGNOSIS — E559 Vitamin D deficiency, unspecified: Secondary | ICD-10-CM

## 2023-01-09 DIAGNOSIS — I82401 Acute embolism and thrombosis of unspecified deep veins of right lower extremity: Secondary | ICD-10-CM

## 2023-01-09 DIAGNOSIS — E1169 Type 2 diabetes mellitus with other specified complication: Secondary | ICD-10-CM

## 2023-01-09 DIAGNOSIS — Z6841 Body Mass Index (BMI) 40.0 and over, adult: Secondary | ICD-10-CM

## 2023-01-09 DIAGNOSIS — E669 Obesity, unspecified: Secondary | ICD-10-CM

## 2023-01-09 DIAGNOSIS — G4733 Obstructive sleep apnea (adult) (pediatric): Secondary | ICD-10-CM

## 2023-01-09 DIAGNOSIS — R351 Nocturia: Secondary | ICD-10-CM | POA: Diagnosis not present

## 2023-01-09 DIAGNOSIS — M1000 Idiopathic gout, unspecified site: Secondary | ICD-10-CM

## 2023-01-09 DIAGNOSIS — Z0000 Encounter for general adult medical examination without abnormal findings: Secondary | ICD-10-CM

## 2023-01-09 DIAGNOSIS — E1159 Type 2 diabetes mellitus with other circulatory complications: Secondary | ICD-10-CM

## 2023-01-09 DIAGNOSIS — E119 Type 2 diabetes mellitus without complications: Secondary | ICD-10-CM

## 2023-01-09 DIAGNOSIS — E538 Deficiency of other specified B group vitamins: Secondary | ICD-10-CM | POA: Diagnosis not present

## 2023-01-09 NOTE — Assessment & Plan Note (Signed)
hgba1c acceptable, minimize simple carbs. Increase exercise as tolerated. Continue current meds Lab Results  Component Value Date   HGBA1C 6.0 (H) 10/05/2022

## 2023-01-09 NOTE — Assessment & Plan Note (Signed)
Hgba1c to be checked , minimize simple carbs. Increase exercise as tolerated. Continue current meds  

## 2023-01-09 NOTE — Assessment & Plan Note (Signed)
Tolerating statin, encouraged heart healthy diet, avoid trans fats, minimize simple carbs and saturated fats. Increase exercise as tolerated 

## 2023-01-09 NOTE — Assessment & Plan Note (Signed)
Well controlled, no changes to meds. Encouraged heart healthy diet such as the DASH diet and exercise as tolerated.  °

## 2023-01-09 NOTE — Assessment & Plan Note (Signed)
Ghm utd Check labs  See [facility] Health Maintenance  Topic Date Due   OPHTHALMOLOGY EXAM  10/01/2022   FOOT EXAM  12/23/2022   INFLUENZA VACCINE  02/15/2023   HEMOGLOBIN A1C  04/07/2023   Diabetic kidney evaluation - eGFR measurement  04/13/2023   Medicare Annual Wellness (AWV)  06/13/2023   Diabetic kidney evaluation - Urine ACR  06/21/2023   Colonoscopy  05/01/2024   DTaP/Tdap/Td (3 - Td or Tdap) 06/20/2032   Pneumonia Vaccine 76+ Years old  Completed   COVID-19 Vaccine  Completed   Hepatitis C Screening  Completed   Zoster Vaccines- Shingrix  Completed   HPV VACCINES  Aged Out

## 2023-01-09 NOTE — Patient Instructions (Signed)
Preventive Care 65 Years and Older, Male Preventive care refers to lifestyle choices and visits with your health care provider that can promote health and wellness. Preventive care visits are also called wellness exams. What can I expect for my preventive care visit? Counseling During your preventive care visit, your health care provider may ask about your: Medical history, including: Past medical problems. Family medical history. History of falls. Current health, including: Emotional well-being. Home life and relationship well-being. Sexual activity. Memory and ability to understand (cognition). Lifestyle, including: Alcohol, nicotine or tobacco, and drug use. Access to firearms. Diet, exercise, and sleep habits. Work and work environment. Sunscreen use. Safety issues such as seatbelt and bike helmet use. Physical exam Your health care provider will check your: Height and weight. These may be used to calculate your BMI (body mass index). BMI is a measurement that tells if you are at a healthy weight. Waist circumference. This measures the distance around your waistline. This measurement also tells if you are at a healthy weight and may help predict your risk of certain diseases, such as type 2 diabetes and high blood pressure. Heart rate and blood pressure. Body temperature. Skin for abnormal spots. What immunizations do I need?  Vaccines are usually given at various ages, according to a schedule. Your health care provider will recommend vaccines for you based on your age, medical history, and lifestyle or other factors, such as travel or where you work. What tests do I need? Screening Your health care provider may recommend screening tests for certain conditions. This may include: Lipid and cholesterol levels. Diabetes screening. This is done by checking your blood sugar (glucose) after you have not eaten for a while (fasting). Hepatitis C test. Hepatitis B test. HIV (human  immunodeficiency virus) test. STI (sexually transmitted infection) testing, if you are at risk. Lung cancer screening. Colorectal cancer screening. Prostate cancer screening. Abdominal aortic aneurysm (AAA) screening. You may need this if you are a current or former smoker. Talk with your health care provider about your test results, treatment options, and if necessary, the need for more tests. Follow these instructions at home: Eating and drinking  Eat a diet that includes fresh fruits and vegetables, whole grains, lean protein, and low-fat dairy products. Limit your intake of foods with high amounts of sugar, saturated fats, and salt. Take vitamin and mineral supplements as recommended by your health care provider. Do not drink alcohol if your health care provider tells you not to drink. If you drink alcohol: Limit how much you have to 0-2 drinks a day. Know how much alcohol is in your drink. In the [place] one drink equals one 12 oz bottle of beer (355 mL), one 5 oz glass of wine (148 mL), or one 1 oz glass of hard liquor (44 mL). Lifestyle Brush your teeth every morning and night with fluoride toothpaste. Floss one time each day. Exercise for at least 30 minutes 5 or more days each week. Do not use any products that contain nicotine or tobacco. These products include cigarettes, chewing tobacco, and vaping devices, such as e-cigarettes. If you need help quitting, ask your health care provider. Do not use drugs. If you are sexually active, practice safe sex. Use a condom or other form of protection to prevent STIs. Take aspirin only as told by your health care provider. Make sure that you understand how much to take and what form to take. Work with your health care provider to find out whether it is safe   and beneficial for you to take aspirin daily. Ask your health care provider if you need to take a cholesterol-lowering medicine (statin). Find healthy ways to manage stress, such  as: Meditation, yoga, or listening to music. Journaling. Talking to a trusted person. Spending time with friends and family. Safety Always wear your seat belt while driving or riding in a vehicle. Do not drive: If you have been drinking alcohol. Do not ride with someone who has been drinking. When you are tired or distracted. While texting. If you have been using any mind-altering substances or drugs. Wear a helmet and other protective equipment during sports activities. If you have firearms in your house, make sure you follow all gun safety procedures. Minimize exposure to UV radiation to reduce your risk of skin cancer. What's next? Visit your health care provider once a year for an annual wellness visit. Ask your health care provider how often you should have your eyes and teeth checked. Stay up to date on all vaccines. This information is not intended to replace advice given to you by your health care provider. Make sure you discuss any questions you have with your health care provider. Document Revised: 12/29/2020 Document Reviewed: 12/29/2020 Elsevier Patient Education  2024 Elsevier Inc.  

## 2023-01-09 NOTE — Progress Notes (Signed)
Established Patient Office Visit  Subjective   Patient ID: Daniel Powers, male    DOB: 1956/02/07  Age: 67 y.o. MRN: [id number]  Chief Complaint  Patient presents with   Annual Exam    Pt states not fasting     HPI Discussed the use of AI scribe software for clinical note transcription with the patient, who gave verbal consent to proceed.  History of Present Illness   The patient, with a history of diabetes, presents for an annual physical. He reports no need for medication refills and has had a recent eye exam. He does not regularly check his blood sugar levels, but reports that his last A1C was slightly elevated. He is working to lower it. He has been struggling with weight loss, experiencing fluctuations. He has tried using a CPAP machine for sleep apnea, but found it uncomfortable and has not had any recent problems. He reports waking up to urinate around 5:00 AM daily. He has a spot on his skin that may have grown slightly, but is not raised or bothersome. He also has visible veins, but they are not causing any discomfort. He has been wearing diabetic socks for compression. He has a family history of cancer, with a sister and niece both dying from a type of gynecological cancer.      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   Preventative use of agents affecting  estrogen receptors or levels 11/26/2014   Preventative health care 11/26/2014   Class 3 severe obesity with serious comorbidity and body mass index (BMI) of 40.0 to 44.9 in adult (HCC) 12/02/2013   Morbid obesity (HCC) 08/08/2013   Long term (current) use of anticoagulants 08/08/2013   Hyperlipidemia 10/22/2012   Personal history of colonic polyps 01/02/2012   Musculoskeletal pain 01/02/2012   Anxiety 11/21/2011   OSA (obstructive sleep apnea) 11/21/2011   Special screening for malignant neoplasms, colon 09/20/2011   DVT, HX OF 09/12/2010   DVT 03/03/2010   GOUT 09/06/2009   COSTOCHONDRITIS 09/06/2009   FOREIGN BODY, HAND 01/16/2008   LEG PAIN, RIGHT 01/14/2008   PNEUMONIA DUE TO OTHER SPECIFIED BACTERIA 10/09/2007   INFLUENZA WITH PNEUMONIA 10/09/2007   ERECTILE DYSFUNCTION 09/02/2006   Hypertension associated with type 2 diabetes mellitus (HCC) 09/02/2006   Allergic rhinitis 09/02/2006   Past Medical History:  Diagnosis Date   Anxiety    Arthritis    Asthma    COPD (chronic obstructive pulmonary disease) (HCC)    DVT (deep venous thrombosis) (HCC)    Gout    High cholesterol    Hypertension    Influenza with pneumonia    Lactose intolerance    Obesity    Pre-diabetes    Psychosexual dysfunction with inhibited sexual excitement    Past Surgical History:  Procedure Laterality Date   HAND SURGERY     right   TONSILLECTOMY AND ADENOIDECTOMY  Social History   Tobacco Use   Smoking status: Never   Smokeless tobacco: Never  Substance Use Topics   Alcohol use: Yes    Comment: ocass- once or twice a month   Drug use: No   Social History   Socioeconomic History   Marital status: Married    Spouse name: Becky   Number of children: 1   Years of education: Not on file   Highest education level: Not on file  Occupational History   Occupation: attorney at Social worker--  w/s    Employer: [employer]  Tobacco Use   Smoking status: Never   Smokeless tobacco: Never  Substance  and Sexual Activity   Alcohol use: Yes    Comment: ocass- once or twice a month   Drug use: No   Sexual activity: Yes    Partners: Female  Other Topics Concern   Not on file  Social History Narrative   Exercise --a little-- walking    Social Determinants of Health   Financial Resource Strain: Low Risk  (06/06/2021)   Overall Financial Resource Strain (CARDIA)    Difficulty of Paying Living Expenses: Not hard at all  Food Insecurity: No Food Insecurity (06/12/2022)   Hunger Vital Sign    Worried About Running Out of Food in the Last Year: Never true    Ran Out of Food in the Last Year: Never true  Transportation Needs: No Transportation Needs (06/12/2022)   PRAPARE - Administrator, Civil Service (Medical): No    Lack of Transportation (Non-Medical): No  Physical Activity: Inactive (06/06/2021)   Exercise Vital Sign    Days of Exercise per Week: 0 days    Minutes of Exercise per Session: 0 min  Stress: No Stress Concern Present (06/06/2021)   [employer] of Occupational Health - Occupational Stress Questionnaire    Feeling of Stress : Not at all  Social Connections: Socially Integrated (06/06/2021)   Social Connection and Isolation Panel [NHANES]    Frequency of Communication with Friends and Family: More than three times a week    Frequency of Social Gatherings with Friends and Family: More than three times a week    Attends Religious Services: More than 4 times per year    Active Member of [employer] or Organizations: Yes    Attends Banker Meetings: More than 4 times per year    Marital Status: Married  Catering manager Violence: Not At Risk (06/12/2022)   Humiliation, Afraid, Rape, and Kick questionnaire    Fear of Current or Ex-Partner: No    Emotionally Abused: No    Physically Abused: No    Sexually Abused: No   Family Status  Relation Name Status   Mother  Alive   Father  Deceased at age 57       cirrhosis   Sister  Deceased at age 76    Brother  Alive   MGM  Deceased   MGF  Deceased   PGM  Deceased   PGF  Deceased   Mat Uncle  Deceased at age 59   Pat Aunt  (Not Specified)   Niece  Deceased at age 81       uterine cancer   Neg Hx  (Not Specified)   Family History  Problem Relation Age of Onset   Hypertension Mother    Alzheimer's disease Mother    Hypertension Father    Alcohol abuse Father    Obesity Father    Cancer Sister        "  male organs"   Stroke Brother    Heart disease Brother 77       MI   Hypertension Maternal Grandmother    Alzheimer's disease Maternal Grandmother    Hypertension Maternal Grandfather    Hypertension Paternal Grandmother    Hypertension Paternal Grandfather    Lung cancer Maternal Uncle    Esophageal cancer Maternal Uncle    Alzheimer's disease Paternal Aunt    Cancer Niece    Colon cancer Neg Hx    Allergies  Allergen Reactions   Penicillins     REACTION: Penicillin      Review of Systems  Constitutional:  Negative for fever and malaise/fatigue.  HENT:  Negative for congestion.   Eyes:  Negative for blurred vision.  Respiratory:  Negative for shortness of breath.   Cardiovascular:  Negative for chest pain, palpitations and leg swelling.  Gastrointestinal:  Negative for abdominal pain, blood in stool and nausea.  Genitourinary:  Negative for dysuria and frequency.  Musculoskeletal:  Negative for falls.  Skin:  Negative for rash.  Neurological:  Negative for dizziness, loss of consciousness and headaches.  Endo/Heme/Allergies:  Negative for environmental allergies.  Psychiatric/Behavioral:  Negative for depression. The patient is not nervous/anxious.       Objective:     BP (!) 140/80 (BP Location: Left Arm, Patient Position: Sitting, Cuff Size: Large)   Pulse 78   Temp 98.3 F (36.8 C) (Oral)   Resp 18   Ht 5\' 11"  (1.803 m)   Wt 299 lb 3.2 oz (135.7 kg)   SpO2 97%   BMI 41.73 kg/m  BP Readings from Last 3 Encounters:  01/09/23 (!) 140/80   12/19/22 136/80  11/02/22 125/75   Wt Readings from Last 3 Encounters:  01/09/23 299 lb 3.2 oz (135.7 kg)  12/19/22 291 lb (132 kg)  11/02/22 294 lb 9.6 oz (133.6 kg)   SpO2 Readings from Last 3 Encounters:  01/09/23 97%  12/19/22 98%  11/02/22 99%      Physical Exam Vitals and nursing note reviewed.  Constitutional:      General: He is not in acute distress.    Appearance: Normal appearance. He is well-developed. He is not ill-appearing.  HENT:     Head: Normocephalic and atraumatic.     Right Ear: Tympanic membrane, ear canal and external ear normal.     Left Ear: Tympanic membrane, ear canal and external ear normal.     Nose: Nose normal.     Mouth/Throat:     Mouth: Mucous membranes are moist.     Pharynx: Oropharynx is clear.  Eyes:     Pupils: Pupils are equal, round, and reactive to light.  Neck:     Thyroid: No thyromegaly.  Cardiovascular:     Rate and Rhythm: Normal rate and regular rhythm.     Heart sounds: No murmur heard. Pulmonary:     Effort: Pulmonary effort is normal. No respiratory distress.     Breath sounds: Normal breath sounds. No wheezing or rales.  Chest:     Chest wall: No tenderness.  Abdominal:     General: Abdomen is flat.     Tenderness: There is no abdominal tenderness.  Musculoskeletal:        General: No tenderness. Normal range of motion.     Cervical back: Normal range of motion and neck supple.     Right hip: No tenderness. Normal range of motion. Normal strength.     Left hip: No tenderness.  Normal range of motion. Normal strength.     Right foot: No swelling or bony tenderness.     Left foot: No swelling or bony tenderness.  Skin:    General: Skin is warm and dry.  Neurological:     General: No focal deficit present.     Mental Status: He is alert and oriented to person, place, and time.  Psychiatric:        Behavior: Behavior normal.        Thought Content: Thought content normal.        Judgment: Judgment normal.      No results found for any visits on 01/09/23.  Last CBC Lab Results  Component Value Date   WBC 6.2 10/03/2021   HGB 14.5 10/03/2021   HCT 43.8 10/03/2021   MCV 86 10/03/2021   MCH 28.4 10/03/2021   RDW 13.5 10/03/2021   PLT 191 10/03/2021   Last metabolic panel Lab Results  Component Value Date   GLUCOSE 85 04/12/2022   NA 140 04/12/2022   K 4.1 04/12/2022   CL 104 04/12/2022   CO2 22 04/12/2022   BUN 17 04/12/2022   CREATININE 0.77 04/12/2022   EGFR 98 04/12/2022   CALCIUM 9.3 04/12/2022   PROT 6.9 04/12/2022   ALBUMIN 4.2 04/12/2022   LABGLOB 2.7 04/12/2022   AGRATIO 1.6 04/12/2022   BILITOT 0.8 04/12/2022   ALKPHOS 47 04/12/2022   AST 13 04/12/2022   ALT 12 04/12/2022   Last lipids Lab Results  Component Value Date   CHOL 128 04/12/2022   HDL 45 04/12/2022   LDLCALC 70 04/12/2022   TRIG 58 04/12/2022   CHOLHDL 3.2 10/03/2021   Last hemoglobin A1c Lab Results  Component Value Date   HGBA1C 6.0 (H) 10/05/2022   Last thyroid functions Lab Results  Component Value Date   TSH 0.864 04/12/2022   T3TOTAL 92 12/25/2018   Last vitamin D Lab Results  Component Value Date   VD25OH 74.2 10/05/2022   Last vitamin B12 and Folate Lab Results  Component Value Date   VITAMINB12 869 10/05/2022   FOLATE 4.5 12/25/2018      The ASCVD Risk score (Arnett DK, et al., 2019) failed to calculate for the following reasons:   The valid total cholesterol range is 130 to 320 mg/dL    Assessment & Plan:   Problem List Items Addressed This Visit       Unprioritized   Vitamin D deficiency   Relevant Orders   VITAMIN D 25 Hydroxy (Vit-D Deficiency, Fractures)   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 Increase exercise as tolerated. Continue current meds Lab Results  Component Value Date   HGBA1C 6.0 (H) 10/05/2022        Relevant Orders   Lipid panel   Comprehensive metabolic panel   CBC with Differential/Platelet   Hemoglobin A1c   Preventative health care - Primary    Ghm utd Check labs  See AVS Health Maintenance  Topic Date Due   OPHTHALMOLOGY EXAM  10/01/2022   FOOT EXAM  12/23/2022   INFLUENZA VACCINE  02/15/2023   HEMOGLOBIN A1C  04/07/2023  Diabetic kidney evaluation - eGFR measurement  04/13/2023   Medicare Annual Wellness (AWV)  06/13/2023   Diabetic kidney evaluation - Urine ACR  06/21/2023   Colonoscopy  05/01/2024   DTaP/Tdap/Td (3 - Td or Tdap) 06/20/2032   Pneumonia Vaccine 22+ Years old  Completed   COVID-19 Vaccine  Completed   Hepatitis C Screening  Completed   Zoster Vaccines- Shingrix  Completed   HPV VACCINES  Aged Out        Hypertension associated with type 2 diabetes mellitus (HCC)    Well controlled, no changes to meds. Encouraged heart healthy diet such as the DASH diet and exercise as tolerated.        Relevant Orders   Lipid panel   TSH   Comprehensive metabolic panel   CBC with Differential/Platelet   Hemoglobin A1c   Hyperlipidemia associated with type 2 diabetes mellitus (HCC)    Tolerating statin, encouraged heart healthy diet, avoid trans fats, minimize simple carbs and saturated fats. Increase exercise as tolerated       Relevant Orders   Lipid panel   TSH   Comprehensive metabolic panel   CBC with Differential/Platelet   Hemoglobin A1c   Essential hypertension    Well controlled, no changes to meds. Encouraged heart healthy diet such as the DASH diet and exercise as tolerated.        Diet-controlled diabetes mellitus (HCC)    Hgba1c to be checked,  minimize simple carbs. Increase exercise as tolerated. Continue current meds       Other Visit Diagnoses     Nocturia        Relevant Orders   PSA       Return in about 6 months (around 07/11/2023), or if symptoms worsen or fail to improve.    Donato Schultz, DO

## 2023-01-10 LAB — COMPREHENSIVE METABOLIC PANEL
ALT: 11 U/L (ref 0–53)
AST: 12 U/L (ref 0–37)
Albumin: 3.8 g/dL (ref 3.5–5.2)
Alkaline Phosphatase: 37 U/L — ABNORMAL LOW (ref 39–117)
BUN: 14 mg/dL (ref 6–23)
CO2: 27 mEq/L (ref 19–32)
Calcium: 9.3 mg/dL (ref 8.4–10.5)
Chloride: 104 mEq/L (ref 96–112)
Creatinine, Ser: 0.94 mg/dL (ref 0.40–1.50)
GFR: 83.36 mL/min (ref >60.00)
Glucose, Bld: 85 mg/dL (ref 70–99)
Potassium: 4.2 mEq/L (ref 3.5–5.1)
Sodium: 138 mEq/L (ref 135–145)
Total Bilirubin: 0.7 mg/dL (ref 0.2–1.2)
Total Protein: 6.4 g/dL (ref 6.0–8.3)

## 2023-01-10 LAB — HEMOGLOBIN A1C: Hgb A1c MFr Bld: 5.7 % (ref 4.6–6.5)

## 2023-01-10 LAB — VITAMIN B12: Vitamin B-12: 802 pg/mL (ref 211–911)

## 2023-01-10 LAB — CBC WITH DIFFERENTIAL/PLATELET
Basophils Absolute: 0.1 10*3/uL (ref 0.0–0.1)
Basophils Relative: 0.8 % (ref 0.0–3.0)
Eosinophils Absolute: 0.2 10*3/uL (ref 0.0–0.7)
Eosinophils Relative: 2.8 % (ref 0.0–5.0)
HCT: 43.4 % (ref 39.0–52.0)
Hemoglobin: 14 g/dL (ref 13.0–17.0)
Lymphocytes Relative: 26.7 % (ref 12.0–46.0)
Lymphs Abs: 1.8 10*3/uL (ref 0.7–4.0)
MCHC: 32.2 g/dL (ref 30.0–36.0)
MCV: 87.3 fl (ref 78.0–100.0)
Monocytes Absolute: 0.7 10*3/uL (ref 0.1–1.0)
Monocytes Relative: 10.1 % (ref 3.0–12.0)
Neutro Abs: 4.1 10*3/uL (ref 1.4–7.7)
Neutrophils Relative %: 59.6 % (ref 43.0–77.0)
Platelets: 201 10*3/uL (ref 150.0–400.0)
RBC: 4.97 Mil/uL (ref 4.22–5.81)
RDW: 13.8 % (ref 11.5–15.5)
WBC: 6.9 10*3/uL (ref 4.0–10.5)

## 2023-01-10 LAB — LIPID PANEL
Cholesterol: 115 mg/dL (ref 0–200)
HDL: 38.8 mg/dL — ABNORMAL LOW (ref >39.00)
LDL Cholesterol: 63 mg/dL (ref 0–99)
NonHDL: 75.81
Total CHOL/HDL Ratio: 3
Triglycerides: 64 mg/dL (ref 0.0–149.0)
VLDL: 12.8 mg/dL (ref 0.0–40.0)

## 2023-01-10 LAB — TSH: TSH: 0.85 u[IU]/mL (ref 0.35–5.50)

## 2023-01-10 LAB — PSA: PSA: 1.84 ng/mL (ref 0.10–4.00)

## 2023-01-10 LAB — INSULIN, RANDOM: Insulin: 7.8 u[IU]/mL

## 2023-01-10 LAB — VITAMIN D 25 HYDROXY (VIT D DEFICIENCY, FRACTURES): VITD: 71.25 ng/mL (ref 30.00–100.00)

## 2023-01-18 ENCOUNTER — Other Ambulatory Visit: Payer: Self-pay | Admitting: Family Medicine

## 2023-01-18 DIAGNOSIS — I10 Essential (primary) hypertension: Secondary | ICD-10-CM

## 2023-01-25 ENCOUNTER — Ambulatory Visit (INDEPENDENT_AMBULATORY_CARE_PROVIDER_SITE_OTHER): Payer: Medicare HMO | Admitting: Family Medicine

## 2023-02-15 ENCOUNTER — Encounter (INDEPENDENT_AMBULATORY_CARE_PROVIDER_SITE_OTHER): Payer: Self-pay | Admitting: Family Medicine

## 2023-02-15 ENCOUNTER — Ambulatory Visit (INDEPENDENT_AMBULATORY_CARE_PROVIDER_SITE_OTHER): Payer: Medicare HMO | Admitting: Family Medicine

## 2023-02-15 VITALS — BP 136/84 | HR 75 | Temp 98.1°F | Ht 71.0 in | Wt 292.0 lb

## 2023-02-15 DIAGNOSIS — E1159 Type 2 diabetes mellitus with other circulatory complications: Secondary | ICD-10-CM | POA: Diagnosis not present

## 2023-02-15 DIAGNOSIS — E1169 Type 2 diabetes mellitus with other specified complication: Secondary | ICD-10-CM | POA: Diagnosis not present

## 2023-02-15 DIAGNOSIS — Z7985 Long-term (current) use of injectable non-insulin antidiabetic drugs: Secondary | ICD-10-CM

## 2023-02-15 DIAGNOSIS — E785 Hyperlipidemia, unspecified: Secondary | ICD-10-CM | POA: Diagnosis not present

## 2023-02-15 DIAGNOSIS — I152 Hypertension secondary to endocrine disorders: Secondary | ICD-10-CM

## 2023-02-15 DIAGNOSIS — Z6841 Body Mass Index (BMI) 40.0 and over, adult: Secondary | ICD-10-CM

## 2023-02-15 MED ORDER — TIRZEPATIDE 2.5 MG/0.5ML ~~LOC~~ SOAJ
2.5000 mg | SUBCUTANEOUS | 0 refills | Status: DC
Start: 2023-02-15 — End: 2023-09-20

## 2023-02-15 NOTE — Progress Notes (Signed)
Daniel Powers, D.O.  ABFM, ABOM Specializing in Clinical Bariatric Medicine  Office located at: [address] [place], [place]  [address]     Assessment and Plan:   Orders Placed This Encounter  Procedures   CT CARDIAC SCORING (SELF PAY ONLY)   Meds ordered this encounter  Medications   tirzepatide (MOUNJARO) 2.5 MG/0.5ML Pen    Sig: Inject 2.5 mg into the skin once a week.    Dispense:  2 mL    Refill:  0     Type 2 diabetes mellitus with obesity (HCC) Assessment: His diabetes mellitus is currently diet/exercise controlled. Pt has only lost a total of 17 lbs since starting the program in 2020. He has struggled to follow the meal plan and craves crabs and "[employer]".   Lab Results  Component Value Date   HGBA1C 6.0 (H) 10/05/2022   HGBA1C 5.7 (H) 04/12/2022   INSULIN 9.6 04/12/2022   INSULIN 11.0 10/03/2021   INSULIN 7.6 09/30/2020    Begin Mounjaro 2.5 mg once weekly. Patient denies a personal history of pancreatitis;  and denies a family history of medullary thyroid carcinoma or multiple endocrine neoplasia type II.  I informed the patient that the demand for GLP-1-like medications is high and supply is low, therefore the medication can be difficult to obtain.  Strategies to obtain discussed with patient.  Potential risks/ benefits of the medication were explained to patient. Long discussion had regarding how this medication can help protect his kidneys and heart in regards to his significantly increased risk for cardiovascular events being a morbidly obese diabetic with multiple medical problems. Explained that the more he eats "off-plan", the more likely for him to have side effects of the drug.  All questions were answered appropriately and alternative treatment options were discussed. I highly encouraged pt to take this medication.    Hypertension associated with type 2 diabetes mellitus (HCC) Assessment & Plan: HTN is being treated with Norvasc 5 mg  daily and Benicar 40 mg daily. Blood pressure is stable. No concerns in this regard today.   Last 3 blood pressure readings in our office are as follows: BP Readings from Last 3 Encounters:  02/15/23 136/84  01/09/23 (!) 140/80  12/19/22 136/80   Continue with both antihypertensive medications per PCP. Continue with Prudent nutritional plan and low sodium diet, advance exercise as tolerated.   We will continue to monitor closely alongside PCP/ specialists.  Pt reminded to also f/up with those individuals as instructed by them.    Hyperlipidemia associated with type 2 diabetes mellitus (HCC)- low HDL Assessment & Plan: Condition is being treated with Lipitor 10 mg daily. Labs from PCP below show that his HDL levels are too low. CHOL, LDL, and TRIG levels are stable.   Lab Results  Component Value Date   CHOL 115 01/09/2023   HDL 38.80 (L) 01/09/2023   LDLCALC 63 01/09/2023   TRIG 64.0 01/09/2023   CHOLHDL 3 01/09/2023   Continue with Lipitor per PCP and our treatment plan of a heart-heathy, low cholesterol meal plan. To improve his HDL levels, I  recommend: aerobic activity with eventual goal of a minimum of 150+ min wk plus 2 days/ week of resistance or strength training.     TREATMENT PLAN FOR OBESITY: BMI 40.0-44.9, adult (HCC) - current BMI 40.74  Morbid obesity (HCC)-start bmi 43.10/DATE 12/25/2018 Assessment & Plan: Daniel Powers is here to discuss his progress with his obesity treatment plan along  with follow-up of his obesity related diagnoses. See Medical Weight Management Flowsheet for complete bioelectrical impedance results.  Condition is not optimized. Biometric data collected today, was reviewed with patient.   Since last office visit on 12/19/22 patient's  Muscle mass has decreased by 1.2 lb. Fat mass has increased by 1.8 lb. Total body water has increased by 2.6 lb.  Counseling done on how various foods will affect these numbers and how to maximize success  Total lbs  lost to date: 17 lbs Total weight loss percentage to date: 5.50%   Since pt frequently eats out for breakfast, I recommended him to try the Egg Job Founds with Extra Grilled Chicken Breast from [employer] or the Grilled Chicken Egg & Cheese English Muffin from [employer]. Pt agrees to continue the Category 2 meal plan with breakfast and lunch options and if he desires he can journal 1200-1300 calories and 85+ grams protein daily.   Behavioral Intervention Additional resources provided today:  Eating Out Guide Evidence-based interventions for health behavior change were utilized today including the discussion of self monitoring techniques, problem-solving barriers and SMART goal setting techniques.   Regarding patient's less desirable eating habits and patterns, we employed the technique of small changes.  Pt will specifically work on: continuing with prescribed meal plan for next visit.    Recommended Physical Activity Goals Daniel Powers has been advised to slowly work up to 150 minutes of moderate intensity aerobic activity a week and strengthening exercises 2-3 times per week for cardiovascular health, weight loss maintenance and preservation of muscle mass. He has agreed to Continue current level of physical activity   FOLLOW UP: Return in about 4 weeks (around 03/15/2023). He was informed of the importance of frequent follow up visits to maximize his success with intensive lifestyle modifications for his multiple health conditions.  Subjective:   Chief complaint: Obesity Daniel Powers is here to discuss his progress with his obesity treatment plan. He is on the the Category 2 Plan and keeping a food journal and adhering to recommended goals of 1200-1300 calories and 85+ protein and states he is following his eating plan approximately 90% of the time. He states he is walking 30 minutes 2 days per week.  Interval History:  Daniel Powers is here for a follow up office visit. Since last OV, Daniel Powers has  been doing well. He reports not journaling his intake, however he is working on portion control and eating a more balanced diet. For breakfast, he ordinarily has the [employer] from [employer]. For lunch, he drinks two protein shakes. For dinner, he tends to follow the meal plan.   Pharmacotherapy for weight loss: He is not currently taking anything for medical weight loss.  Denies side effects.    Review of Systems:  Pertinent positives were addressed with patient today.  Reviewed by clinician on day of visit: allergies, medications, problem list, medical history, surgical history, family history, social history, and previous encounter notes.  Weight Summary and Biometrics   Weight Lost Since Last Visit: 0lb  Weight Gained Since Last Visit: 1lb   Vitals Temp: 98.1 F (36.7 C) BP: 136/84 Pulse Rate: 75 SpO2: 95 %   Anthropometric Measurements Height: 5\' 11"  (1.803 m) Weight: 292 lb (132.5 kg) BMI (Calculated): 40.74 Weight at Last Visit: 291lb Weight Lost Since Last Visit: 0lb Weight Gained Since Last Visit: 1lb Starting Weight: 309lb Total Weight Loss (lbs): 17 lb (7.711 kg) Peak Weight: 340lb   Body Composition  Body Fat %:  37.8 % Fat Mass (lbs): 110.4 lbs Muscle Mass (lbs): 173 lbs Total Body Water (lbs): 134.8 lbs Visceral Fat Rating : 26   Other Clinical Data Fasting: no Labs: no Today's Visit #: 51 Starting Date: 12/25/18   Objective:   PHYSICAL EXAM: Blood pressure 136/84, pulse 75, temperature 98.1 F (36.7 C), height 5\' 11"  (1.803 m), weight 292 lb (132.5 kg), SpO2 95%. Body mass index is 40.73 kg/m.  General: Well Developed, well nourished, and in no acute distress.  HEENT: Normocephalic, atraumatic Skin: Warm and dry, cap RF less 2 sec, good turgor Chest:  Normal excursion, shape, no gross abn Respiratory: speaking in full sentences, no conversational dyspnea NeuroM-Sk: Ambulates w/o assistance, moves * 4 Psych: A and O *3, insight  good, mood-full  DIAGNOSTIC DATA REVIEWED:  BMET    Component Value Date/Time   NA 138 01/09/2023 1353   NA 140 04/12/2022 0952   K 4.2 01/09/2023 1353   CL 104 01/09/2023 1353   CO2 27 01/09/2023 1353   GLUCOSE 85 01/09/2023 1353   GLUCOSE 85 05/07/2006 1230   BUN 14 01/09/2023 1353   BUN 17 04/12/2022 0952   CREATININE 0.94 01/09/2023 1353   CREATININE 0.85 08/01/2013 1250   CALCIUM 9.3 01/09/2023 1353   GFRNONAA 90 05/31/2020 0912   GFRAA 104 05/31/2020 0912   Lab Results  Component Value Date   HGBA1C 5.7 01/09/2023   HGBA1C 6.2 02/01/2018   Lab Results  Component Value Date   INSULIN 9.6 04/12/2022   INSULIN 9.5 12/25/2018   Lab Results  Component Value Date   TSH 0.85 01/09/2023   CBC    Component Value Date/Time   WBC 6.9 01/09/2023 1353   RBC 4.97 01/09/2023 1353   HGB 14.0 01/09/2023 1353   HGB 14.5 10/03/2021 0749   HGB 12.7 (L) 10/10/2010 0935   HCT 43.4 01/09/2023 1353   HCT 43.8 10/03/2021 0749   HCT 39.2 10/10/2010 0935   PLT 201.0 01/09/2023 1353   PLT 191 10/03/2021 0749   MCV 87.3 01/09/2023 1353   MCV 86 10/03/2021 0749   MCV 77 (L) 10/10/2010 0935   MCH 28.4 10/03/2021 0749   MCH 27.9 09/09/2016 1436   MCH 24.9 (L) 10/10/2010 0935   MCH 26.8 02/28/2010 2240   MCHC 32.2 01/09/2023 1353   RDW 13.8 01/09/2023 1353   RDW 13.5 10/03/2021 0749   RDW 16.3 (H) 10/10/2010 0935   Iron Studies No results found for: "IRON", "TIBC", "FERRITIN", "IRONPCTSAT" Lipid Panel     Component Value Date/Time   CHOL 115 01/09/2023 1353   CHOL 128 04/12/2022 0952   TRIG 64.0 01/09/2023 1353   HDL 38.80 (L) 01/09/2023 1353   HDL 45 04/12/2022 0952   CHOLHDL 3 01/09/2023 1353   VLDL 12.8 01/09/2023 1353   LDLCALC 63 01/09/2023 1353   LDLCALC 70 04/12/2022 0952   Hepatic Function Panel     Component Value Date/Time   PROT 6.4 01/09/2023 1353   PROT 6.9 04/12/2022 0952   ALBUMIN 3.8 01/09/2023 1353   ALBUMIN 4.2 04/12/2022 0952   AST 12  01/09/2023 1353   ALT 11 01/09/2023 1353   ALKPHOS 37 (L) 01/09/2023 1353   BILITOT 0.7 01/09/2023 1353   BILITOT 0.8 04/12/2022 0952   BILIDIR 0.1 11/26/2014 1049      Component Value Date/Time   TSH 0.85 01/09/2023 1353   Nutritional Lab Results  Component Value Date   VD25OH 71.25 01/09/2023   VD25OH 74.2 10/05/2022  VD25OH 75.0 04/12/2022    Attestations:   Patient was in the office today and time spent on visit including pre-visit chart review and post-visit care/coordination of care and electronic medical record documentation was 40 minutes. 50% of the time was in face to face counseling of this patient's medical condition(s) and providing education on treatment options to include the first-line treatment of diet and lifestyle modification.   I, Special Randolm Idol , acting as a Stage manager for [employer], DO., have compiled all relevant documentation for today's office visit on behalf of Thomasene Lot, DO, while in the presence of [employer], DO.  I have reviewed the above documentation for accuracy and completeness, and I agree with the above. Daniel Powers, D.O.  The 21st Century Cures Act was signed into law in 2016 which includes the topic of electronic health records.  This provides immediate access to information in MyChart.  This includes consultation notes, operative notes, office notes, lab results and pathology reports.  If you have any questions about what you read please let us know at your next visit so we can discuss your concerns and take corrective action if need be.  We are right here with you.

## 2023-02-17 ENCOUNTER — Other Ambulatory Visit: Payer: Self-pay | Admitting: Family Medicine

## 2023-02-17 DIAGNOSIS — E785 Hyperlipidemia, unspecified: Secondary | ICD-10-CM

## 2023-02-27 ENCOUNTER — Ambulatory Visit (HOSPITAL_BASED_OUTPATIENT_CLINIC_OR_DEPARTMENT_OTHER)
Admission: RE | Admit: 2023-02-27 | Discharge: 2023-02-27 | Disposition: A | Discharge status: Home / Self Care | Payer: Medicare HMO | Source: Ambulatory Visit | Attending: Family Medicine | Admitting: Family Medicine

## 2023-02-27 DIAGNOSIS — E1159 Type 2 diabetes mellitus with other circulatory complications: Secondary | ICD-10-CM | POA: Insufficient documentation

## 2023-02-27 DIAGNOSIS — E785 Hyperlipidemia, unspecified: Secondary | ICD-10-CM | POA: Insufficient documentation

## 2023-02-27 DIAGNOSIS — E669 Obesity, unspecified: Secondary | ICD-10-CM | POA: Insufficient documentation

## 2023-02-27 DIAGNOSIS — E1169 Type 2 diabetes mellitus with other specified complication: Secondary | ICD-10-CM | POA: Insufficient documentation

## 2023-02-27 DIAGNOSIS — I152 Hypertension secondary to endocrine disorders: Secondary | ICD-10-CM | POA: Insufficient documentation

## 2023-03-12 ENCOUNTER — Encounter (INDEPENDENT_AMBULATORY_CARE_PROVIDER_SITE_OTHER): Payer: Self-pay | Admitting: Family Medicine

## 2023-03-12 ENCOUNTER — Ambulatory Visit (INDEPENDENT_AMBULATORY_CARE_PROVIDER_SITE_OTHER): Payer: Medicare HMO | Admitting: Family Medicine

## 2023-03-12 VITALS — BP 134/82 | HR 67 | Temp 98.7°F | Ht 71.0 in | Wt 294.0 lb

## 2023-03-12 DIAGNOSIS — K5909 Other constipation: Secondary | ICD-10-CM | POA: Diagnosis not present

## 2023-03-12 DIAGNOSIS — I152 Hypertension secondary to endocrine disorders: Secondary | ICD-10-CM

## 2023-03-12 DIAGNOSIS — E1169 Type 2 diabetes mellitus with other specified complication: Secondary | ICD-10-CM

## 2023-03-12 DIAGNOSIS — E785 Hyperlipidemia, unspecified: Secondary | ICD-10-CM

## 2023-03-12 DIAGNOSIS — E1159 Type 2 diabetes mellitus with other circulatory complications: Secondary | ICD-10-CM

## 2023-03-12 DIAGNOSIS — E669 Obesity, unspecified: Secondary | ICD-10-CM

## 2023-03-12 DIAGNOSIS — Z6841 Body Mass Index (BMI) 40.0 and over, adult: Secondary | ICD-10-CM

## 2023-03-12 NOTE — Progress Notes (Signed)
Daniel Powers, D.O.  ABFM, ABOM Specializing in Clinical Bariatric Medicine  Office located at: [address] [place], [place]  [address]     Assessment and Plan:    Type 2 diabetes mellitus with obesity (HCC) Assessment: Condition is Improving, but not optimized. This is exercise/diet controlled. His A1c improved from 6.0 on 10/05/2022 to 5.7 on 01/09/2023. He does not check his blood sugar at home. Pt informed me that he hasn't started [facility] yet as he is hesitant to start weight loss medication. He is worried about staying on it forever. He endorses wanting to lose weight the "right way" with exercise and diet. He will wait another month before deciding if he wants to start this or not. Pt had a coronary calcium score done on 03/06/2023 that revealed no acute or unexpected extracardiac findings. Is coronary calcium score is 209 and is placed in the 52nd percentile.  Lab Results  Component Value Date   HGBA1C 5.7 01/09/2023   HGBA1C 6.0 (H) 10/05/2022   HGBA1C 5.7 (H) 04/12/2022   INSULIN 9.6 04/12/2022   INSULIN 11.0 10/03/2021   INSULIN 7.6 09/30/2020    Plan: - Continue to follow our prudent nutritional meal plan.   - I explained some of the benefits that [facility] can do such as help improve/prevent heart and kidney conditions for disease management.   - I explained any potential side effects that GLP-1 and Mounjaro can have on the body such as an increased risk of pancreatitis.    - Intensive lifestyle modification including diet, exercise and weight loss are the first line of treatment for diabetes. Hypoglycemia prevention discussed with the patient.  Eat on a regular basis- no skipping or going long periods without eating.      - Recheck labs in 3 months if not done at Endo provider / PCP.    Hypertension associated with type 2 diabetes mellitus (HCC) Assessment: Condition is Controlled. Pt last 2 readings were controlled. This is maintained with  Norvasc and  Benicar. He tolerates these well and denies any adverse side effects.  Last 3 blood pressure readings in our office are as follows: BP Readings from Last 3 Encounters:  03/12/23 134/82  02/15/23 136/84  01/09/23 (!) 140/80    Plan: - Continue with Norvasc 5 mg daily and Benicar 40 mg daily.   - Daniel Powers BP is 134/82 at goal today.   - Lifestyle changes such as following our low salt, heart healthy meal plan and engaging in a regular exercise program discussed   - Ambulatory blood pressure monitoring encouraged.   - We will continue to monitor symptoms closely alongside PCP and any specialists as they relate to the his weight loss journey.  Pt reminded to also f/up with those individuals as instructed by them.    Hyperlipidemia associated with type 2 diabetes mellitus (HCC)- low HDL Assessment: Condition is Not at goal. This is being maintained with Lipitor. He tolerates this well and denies any adverse side effects.  Lab Results  Component Value Date   CHOL 115 01/09/2023   HDL 38.80 (L) 01/09/2023   LDLCALC 63 01/09/2023   TRIG 64.0 01/09/2023   CHOLHDL 3 01/09/2023   Plan:  - Continue Lipitor 10mg  daily as directed.   - I stressed the importance that patient continue with our prudent nutritional plan that is low in saturated and trans fats, and low in fatty carbs to improve these numbers.   - We will continue routine  screening as patient continues to achieve health goals along their weight loss journey    Other constipation Assessment: Condition is not controlled. Pt informed me that he has dealt with constipation for years. When not exercising or following the prescribed meal plan he poops about once every 2-3 days and feels bloated and pressure in his lower abdomen. He notes when following the meal plan he poops daily.   Plan: - Take MiraLAX as needed.    - Unless pre-existing renal or cardiopulmonary conditions exist which patient was told to limit their fluid intake  by another provider, I recommended roughly one half of their weight in pounds, to be the approximate ounces of non-caloric, non-caffeinated beverages they should drink per day; including more if they are engaging in exercise.   - Continue his prudent nutritional plan that is low in simple carbohydrates, saturated fats and trans fats to goal of 5-10% weight loss to achieve significant health benefits.  Pt encouraged to continually advance exercise and cardiovascular fitness as tolerated throughout weight loss journey.   TREATMENT PLAN FOR OBESITY: BMI 40.0-44.9, adult (HCC) - current BMI 41.02 Morbid obesity (HCC)-start bmi 43.10/DATE 12/25/2018 Assessment:  Daniel Powers is here to discuss his progress with his obesity treatment plan along with follow-up of his obesity related diagnoses. See Medical Weight Management Flowsheet for complete bioelectrical impedance results.  Condition is docourse: improving but not optimized. Biometric data collected today, was reviewed with patient.   Since last office visit on 02/15/2023 patient's  Muscle mass has increased by 1.4lb. Fat mass has increased by 0.2lb. Total body water has decreased by 1.8lb.  Counseling done on how various foods will affect these numbers and how to maximize success  Total lbs lost to date: 15 Total weight loss percentage to date: 4.85  Plan:  Daniel Powers will work on healthier eating habits and will Change plan to Category 1 meal plan with Breakfast and Lunch options  best they can.    Behavioral Intervention Additional resources provided today: category 1 meal plan information, breakfast options, and lunch options Evidence-based interventions for health behavior change were utilized today including the discussion of self monitoring techniques, problem-solving barriers and SMART goal setting techniques.   Regarding patient's less desirable eating habits and patterns, we employed the technique of small changes.  Pt will specifically  work on: Beginning Category 1 meal plan and not skiping meals for next visit.     He has agreed to Think about ways to increase daily physical activity and overcoming barriers to exercise   FOLLOW UP: Return in about 4 weeks (around 04/09/2023).  He was informed of the importance of frequent follow up visits to maximize his success with intensive lifestyle modifications for his multiple health conditions.  Subjective:   Chief complaint: Obesity Daniel Powers is here to discuss his progress with his obesity treatment plan. He is on the Category 2 meal plan with breakfast and lunch options and states he is following his eating plan approximately 25% of the time. He states he is not exercising.   Interval History:  Daniel Powers is here for a follow up office visit.     Since last office visit he has been well. He stresses wanting to lose weight the right way and not rely on weight loss medication. He feels as he lacks dedication with following the prescribed meal plan and the will to exercise. Pt knows which areas he needs to improve on but is struggling to achieve this. He has  also been skipping his meals regularly.   We reviewed his meal plan and all questions were answered.    Review of Systems:  Pertinent positives were addressed with patient today.  Reviewed by clinician on day of visit: allergies, medications, problem list, medical history, surgical history, family history, social history, and previous encounter notes.  Weight Summary and Biometrics   Weight Lost Since Last Visit: 0lb  Weight Gained Since Last Visit: 2lb   Vitals Temp: 98.7 F (37.1 C) BP: 134/82 Pulse Rate: 67 SpO2: 98 %   Anthropometric Measurements Height: 5\' 11"  (1.803 m) Weight: 294 lb (133.4 kg) BMI (Calculated): 41.02 Weight at Last Visit: 292lb Weight Lost Since Last Visit: 0lb Weight Gained Since Last Visit: 2lb Starting Weight: 309lb Total Weight Loss (lbs): 15 lb (6.804 kg) Peak Weight:  340lb   Body Composition  Body Fat %: 37.6 % Fat Mass (lbs): 110.6 lbs Muscle Mass (lbs): 174.4 lbs Total Body Water (lbs): 133 lbs Visceral Fat Rating : 26   Other Clinical Data Fasting: no Labs: no Today's Visit #: 52 Starting Date: 12/25/18     Objective:   PHYSICAL EXAM: Blood pressure 134/82, pulse 67, temperature 98.7 F (37.1 C), height 5\' 11"  (1.803 m), weight 294 lb (133.4 kg), SpO2 98%. Body mass index is 41 kg/m.  General: Well Developed, well nourished, and in no acute distress.  HEENT: Normocephalic, atraumatic Skin: Warm and dry, cap RF less 2 sec, good turgor Chest:  Normal excursion, shape, no gross abn Respiratory: speaking in full sentences, no conversational dyspnea NeuroM-Sk: Ambulates w/o assistance, moves * 4 Psych: A and O *3, insight good, mood-full  DIAGNOSTIC DATA REVIEWED:  BMET    Component Value Date/Time   NA 138 01/09/2023 1353   NA 140 04/12/2022 0952   K 4.2 01/09/2023 1353   CL 104 01/09/2023 1353   CO2 27 01/09/2023 1353   GLUCOSE 85 01/09/2023 1353   GLUCOSE 85 05/07/2006 1230   BUN 14 01/09/2023 1353   BUN 17 04/12/2022 0952   CREATININE 0.94 01/09/2023 1353   CREATININE 0.85 08/01/2013 1250   CALCIUM 9.3 01/09/2023 1353   GFRNONAA 90 05/31/2020 0912   GFRAA 104 05/31/2020 0912   Lab Results  Component Value Date   HGBA1C 5.7 01/09/2023   HGBA1C 6.2 02/01/2018   Lab Results  Component Value Date   INSULIN 9.6 04/12/2022   INSULIN 9.5 12/25/2018   Lab Results  Component Value Date   TSH 0.85 01/09/2023   CBC    Component Value Date/Time   WBC 6.9 01/09/2023 1353   RBC 4.97 01/09/2023 1353   HGB 14.0 01/09/2023 1353   HGB 14.5 10/03/2021 0749   HGB 12.7 (L) 10/10/2010 0935   HCT 43.4 01/09/2023 1353   HCT 43.8 10/03/2021 0749   HCT 39.2 10/10/2010 0935   PLT 201.0 01/09/2023 1353   PLT 191 10/03/2021 0749   MCV 87.3 01/09/2023 1353   MCV 86 10/03/2021 0749   MCV 77 (L) 10/10/2010 0935   MCH 28.4  10/03/2021 0749   MCH 27.9 09/09/2016 1436   MCH 24.9 (L) 10/10/2010 0935   MCH 26.8 02/28/2010 2240   MCHC 32.2 01/09/2023 1353   RDW 13.8 01/09/2023 1353   RDW 13.5 10/03/2021 0749   RDW 16.3 (H) 10/10/2010 0935   Iron Studies No results found for: "IRON", "TIBC", "FERRITIN", "IRONPCTSAT" Lipid Panel     Component Value Date/Time   CHOL 115 01/09/2023 1353   CHOL 128 04/12/2022 0952  TRIG 64.0 01/09/2023 1353   HDL 38.80 (L) 01/09/2023 1353   HDL 45 04/12/2022 0952   CHOLHDL 3 01/09/2023 1353   VLDL 12.8 01/09/2023 1353   LDLCALC 63 01/09/2023 1353   LDLCALC 70 04/12/2022 0952   Hepatic Function Panel     Component Value Date/Time   PROT 6.4 01/09/2023 1353   PROT 6.9 04/12/2022 0952   ALBUMIN 3.8 01/09/2023 1353   ALBUMIN 4.2 04/12/2022 0952   AST 12 01/09/2023 1353   ALT 11 01/09/2023 1353   ALKPHOS 37 (L) 01/09/2023 1353   BILITOT 0.7 01/09/2023 1353   BILITOT 0.8 04/12/2022 0952   BILIDIR 0.1 11/26/2014 1049      Component Value Date/Time   TSH 0.85 01/09/2023 1353   Nutritional Lab Results  Component Value Date   VD25OH 71.25 01/09/2023   VD25OH 74.2 10/05/2022   VD25OH 75.0 04/12/2022    Attestations:   This encounter took 40 total minutes of time including any pre-visit and post-visit time spent on this date of service, including taking a thorough history, reviewing any labs and/or imaging, reviewing prior notes, as well as documenting in the electronic health record on the date of service. Over 50% of that time was in direct face-to-face counseling and coordinating care for the patient today  I, Clinical biochemist, acting as a Stage manager for [employer], DO., have compiled all relevant documentation for today's office visit on behalf of Thomasene Lot, DO, while in the presence of [employer], DO.  I have reviewed the above documentation for accuracy and completeness, and I agree with the above. Daniel Powers, D.O.  The 21st Century  Cures Act was signed into law in 2016 which includes the topic of electronic health records.  This provides immediate access to information in MyChart.  This includes consultation notes, operative notes, office notes, lab results and pathology reports.  If you have any questions about what you read please let us know at your next visit so we can discuss your concerns and take corrective action if need be.  We are right here with you.

## 2023-03-12 NOTE — Patient Instructions (Signed)
The ASCVD Risk score (Arnett DK, et al., 2019) failed to calculate for the following reasons:   The valid total cholesterol range is 130 to 320 mg/dL  

## 2023-03-19 ENCOUNTER — Other Ambulatory Visit: Payer: Self-pay | Admitting: Family Medicine

## 2023-03-19 DIAGNOSIS — I10 Essential (primary) hypertension: Secondary | ICD-10-CM

## 2023-04-17 ENCOUNTER — Ambulatory Visit (INDEPENDENT_AMBULATORY_CARE_PROVIDER_SITE_OTHER): Payer: Medicare HMO | Admitting: Family Medicine

## 2023-04-18 ENCOUNTER — Other Ambulatory Visit: Payer: Self-pay | Admitting: Family Medicine

## 2023-04-18 DIAGNOSIS — I82401 Acute embolism and thrombosis of unspecified deep veins of right lower extremity: Secondary | ICD-10-CM

## 2023-04-26 ENCOUNTER — Telehealth: Payer: Self-pay | Admitting: Family Medicine

## 2023-04-26 ENCOUNTER — Other Ambulatory Visit: Payer: Self-pay

## 2023-04-26 DIAGNOSIS — J4521 Mild intermittent asthma with (acute) exacerbation: Secondary | ICD-10-CM

## 2023-04-26 MED ORDER — ALBUTEROL SULFATE HFA 108 (90 BASE) MCG/ACT IN AERS: 2.0000 | INHALATION_SPRAY | Freq: Four times a day (QID) | RESPIRATORY_TRACT | 5 refills | Status: DC | PRN

## 2023-04-26 NOTE — Telephone Encounter (Signed)
Prescription Request  04/26/2023  Is this a "Controlled Substance" medicine? No  LOV: 01/09/2023  What is the name of the medication or equipment?   albuterol [facility] HFA) 108 (90 Base) MCG/ACT inhaler [[id number]]  promethazine-dextromethorphan (PROMETHAZINE-DM) 6.25-15 MG/5ML syrup [[id number]]  DISCONTINUED  Have you contacted your pharmacy to request a refill? No   Which pharmacy would you like this sent to?   [id number] [place], [place] - [address] AT [facility] [address] & [address] [place] [address] Phone: [phone] Fax: [phone]  Patient notified that their request is being sent to the clinical staff for review and that they should receive a response within 2 business days.   Please advise at Mobile [phone] (mobile)

## 2023-04-27 NOTE — Telephone Encounter (Signed)
Refill sent yesterday

## 2023-05-04 ENCOUNTER — Ambulatory Visit
Admission: RE | Admit: 2023-05-04 | Discharge: 2023-05-04 | Disposition: A | Discharge status: Home / Self Care | Payer: Medicare HMO | Source: Ambulatory Visit | Attending: Internal Medicine

## 2023-05-04 VITALS — BP 138/81 | HR 66 | Temp 98.8°F | Resp 18

## 2023-05-04 DIAGNOSIS — J209 Acute bronchitis, unspecified: Secondary | ICD-10-CM

## 2023-05-04 DIAGNOSIS — J4521 Mild intermittent asthma with (acute) exacerbation: Secondary | ICD-10-CM

## 2023-05-04 DIAGNOSIS — J440 Chronic obstructive pulmonary disease with acute lower respiratory infection: Secondary | ICD-10-CM | POA: Diagnosis not present

## 2023-05-04 DIAGNOSIS — J441 Chronic obstructive pulmonary disease with (acute) exacerbation: Secondary | ICD-10-CM | POA: Diagnosis not present

## 2023-05-04 DIAGNOSIS — Z8709 Personal history of other diseases of the respiratory system: Secondary | ICD-10-CM

## 2023-05-04 MED ORDER — ALBUTEROL SULFATE HFA 108 (90 BASE) MCG/ACT IN AERS: 2.0000 | INHALATION_SPRAY | Freq: Four times a day (QID) | RESPIRATORY_TRACT | 2 refills | Status: DC | PRN

## 2023-05-04 MED ORDER — DOXYCYCLINE HYCLATE 100 MG PO CAPS
100.0000 mg | ORAL_CAPSULE | Freq: Two times a day (BID) | ORAL | 0 refills | Status: AC
Start: 2023-05-04 — End: 2023-05-14

## 2023-05-04 MED ORDER — PROMETHAZINE-DM 6.25-15 MG/5ML PO SYRP
5.0000 mL | ORAL_SOLUTION | Freq: Four times a day (QID) | ORAL | 0 refills | Status: DC | PRN
Start: 2023-05-04 — End: 2023-08-22

## 2023-05-04 NOTE — ED Triage Notes (Signed)
Pt states he started to feel unwell on 10/8 c/o cough, productive with green mucous, congestion in chest, chills, sore throat, drainage in throat. Coughing is worse when laying down. States overall he is starting to feel better but his coughing has worsened.  Hx of asthma Using albuterol inhaler and Flonase

## 2023-05-04 NOTE — ED Provider Notes (Signed)
[facility]  Note:  This document was prepared using Dragon voice recognition software and may include unintentional dictation errors.  MRN: [id number] DOB: 10-27-1955 DATE: 05/04/23   Subjective:  Chief Complaint:  Chief Complaint  Patient presents with   Cough    Coughing up green stuff - Entered by patient     HPI: Daniel Powers is a 67 y.o. male presenting for productive cough for almost 2 weeks.  Patient states on 04/24/2023 he started with a productive cough and nasal congestion as well as a sore throat and post nasal drip. No known sick contacts. He is concerned because he feels as if his cough is getting worse and make it difficult to sleep at night. He has a history of asthma and has been using his inhaler more. Reports using his inhaler last night with improvement. He has been taking OTC cough syrup with no relief. Denies fever, nausea/vomiting, otalgia. Endorses productive cough, sore throat, nasal congestion. Presents NAD.  Prior to Admission medications   Medication Sig Start Date End Date Taking? Authorizing Provider  doxycycline (VIBRAMYCIN) 100 MG capsule Take 1 capsule (100 mg total) by mouth 2 (two) times daily for 10 days. 05/04/23 05/14/23 Yes Dede Dobesh P, PA-C  promethazine-dextromethorphan (PROMETHAZINE-DM) 6.25-15 MG/5ML syrup Take 5 mLs by mouth 4 (four) times daily as needed for cough. 05/04/23  Yes Cung Masterson P, PA-C  albuterol (PROAIR HFA) 108 (90 Base) MCG/ACT inhaler Inhale 2 puffs into the lungs every 6 (six) hours as needed for wheezing or shortness of breath. 05/04/23   Julis Haubner P, PA-C  amLODipine (NORVASC) 5 MG tablet TAKE 1 TABLET(5 MG) BY MOUTH DAILY 01/19/23   Zola Button, Yvonne R, DO  atorvastatin (LIPITOR) 10 MG tablet TAKE 1 TABLET(10 MG) BY MOUTH DAILY 02/19/23   Zola Button, Grayling Congress, DO  cetirizine (ZYRTEC) 10 MG tablet Take 10 mg by mouth daily.    [provider]  Cholecalciferol (VITAMIN D3) 50 MCG (2000 UT) CAPS  Take 2 capsules (4,000 Units total) by mouth daily. 10/05/22   Opalski, Deborah, DO  CINNAMON PO Take 1 tablet by mouth daily.    [provider]  cyanocobalamin (VITAMIN B12) 500 MCG tablet Take 1 tablet (500 mcg total) by mouth daily. 10/05/22   Opalski, Deborah, DO  ELIQUIS 5 MG TABS tablet TAKE 1 TABLET(5 MG) BY MOUTH TWICE DAILY 04/18/23   Zola Button, Grayling Congress, DO  fish oil-omega-3 fatty acids 1000 MG capsule Take 1 g by mouth daily.    [provider]  Flaxseed, Linseed, 1000 MG CAPS Take 1 capsule by mouth daily.    [provider]  fluticasone (FLONASE) 50 MCG/ACT nasal spray Place 2 sprays into both nostrils daily. 09/09/21   Sharlene Dory, DO  olmesartan (BENICAR) 40 MG tablet TAKE 1 TABLET(40 MG) BY MOUTH DAILY 03/20/23   Zola Button, Grayling Congress, DO  RSV vaccine recomb adjuvanted (AREXVY) 120 MCG/0.5ML injection Inject into the muscle. 06/20/22   Judyann Munson, MD  tadalafil (CIALIS) 20 MG tablet Take 20 mg by mouth daily as needed.    [provider]  tirzepatide Greggory Keen) 2.5 MG/0.5ML Pen Inject 2.5 mg into the skin once a week. Patient not taking: Reported on 03/12/2023 02/15/23   Thomasene Lot, DO     Allergies  Allergen Reactions   Penicillins     REACTION: Penicillin    History:   Past Medical History:  Diagnosis Date   Anxiety    Arthritis  Asthma    COPD (chronic obstructive pulmonary disease) (HCC)    DVT (deep venous thrombosis) (HCC)    Gout    High cholesterol    Hypertension    Influenza with pneumonia    Lactose intolerance    Obesity    Pre-diabetes    Psychosexual dysfunction with inhibited sexual excitement      Past Surgical History:  Procedure Laterality Date   HAND SURGERY     right   TONSILLECTOMY AND ADENOIDECTOMY      Family History  Problem Relation Age of Onset   Hypertension Mother    Alzheimer's disease Mother    Hypertension Father    Alcohol abuse Father    Obesity Father    Cancer  Sister        "male organs"   Stroke Brother    Heart disease Brother 36       MI   Hypertension Maternal Grandmother    Alzheimer's disease Maternal Grandmother    Hypertension Maternal Grandfather    Hypertension Paternal Grandmother    Hypertension Paternal Grandfather    Lung cancer Maternal Uncle    Esophageal cancer Maternal Uncle    Alzheimer's disease Paternal Aunt    Cancer Niece    Colon cancer Neg Hx     Social History   Tobacco Use   Smoking status: Never   Smokeless tobacco: Never  Substance Use Topics   Alcohol use: Yes    Comment: ocass- once or twice a month   Drug use: No    Review of Systems  Constitutional:  Negative for fever.  HENT:  Positive for congestion, postnasal drip, rhinorrhea, sinus pressure and sore throat. Negative for ear pain.   Respiratory:  Positive for cough.   Gastrointestinal:  Negative for abdominal pain, nausea and vomiting.     Objective:   Vitals: BP 138/81 (BP Location: Right Arm)   Pulse 66   Temp 98.8 F (37.1 C) (Oral)   Resp 18   SpO2 96%   Physical Exam Constitutional:      General: He is not in acute distress.    Appearance: Normal appearance. He is well-developed. He is morbidly obese. He is not ill-appearing or toxic-appearing.  HENT:     Head: Normocephalic and atraumatic.     Right Ear: Ear canal normal. A middle ear effusion is present.     Left Ear: Ear canal normal. A middle ear effusion is present.     Nose:     Right Turbinates: Enlarged.     Left Turbinates: Enlarged.     Mouth/Throat:     Pharynx: Oropharynx is clear. Uvula midline. No pharyngeal swelling, oropharyngeal exudate or posterior oropharyngeal erythema.     Tonsils: No tonsillar exudate or tonsillar abscesses.  Cardiovascular:     Rate and Rhythm: Normal rate and regular rhythm.     Heart sounds: Normal heart sounds.  Pulmonary:     Effort: Pulmonary effort is normal.     Breath sounds: Normal breath sounds.     Comments: Clear  to auscultation bilaterally  Abdominal:     General: Bowel sounds are normal.     Palpations: Abdomen is soft.     Tenderness: There is no abdominal tenderness.  Skin:    General: Skin is warm and dry.  Neurological:     General: No focal deficit present.     Mental Status: He is alert.  Psychiatric:        Mood and  Affect: Mood and affect normal.     Results:  Labs: No results found for this or any previous visit (from the past 24 hour(s)).  Radiology: No results found.   UC Course/Treatments:  Procedures: Procedures   Medications Ordered in UC: Medications - No data to display   Assessment and Plan :     ICD-10-CM   1. Acute bronchitis, unspecified organism  J20.9     2. History of asthma  Z87.09     3. Mild intermittent asthma with exacerbation  J45.21 albuterol (PROAIR HFA) 108 (90 Base) MCG/ACT inhaler     Acute bronchitis, unspecified organism Afebrile, nontoxic-appearing, NAD. VSS. DDX includes but not limited to: bronchitis, pneumonia, asthma exacerbation COVID and flu not ordered given length of symptoms. Doxycycline 100mg  BID was prescribed given persistent cough and worsening of asthma. His albuterol inhaler was refilled and Promethazine-DM QID PRN was sent to his pharmacy for his cough. Strict ED precautions were given and patient verbalized understanding.  ED Discharge Orders          Ordered    albuterol [facility] HFA) 108 (90 Base) MCG/ACT inhaler  Every 6 hours PRN        05/04/23 1128    doxycycline (VIBRAMYCIN) 100 MG capsule  2 times daily        05/04/23 1128    promethazine-dextromethorphan (PROMETHAZINE-DM) 6.25-15 MG/5ML syrup  4 times daily PRN        05/04/23 1128             PDMP not reviewed this encounter.     Lakeeta Dobosz P, PA-C 05/04/23 1147

## 2023-05-04 NOTE — Discharge Instructions (Signed)
A prescription was sent for Doxycycline. This is an antibiotic used to treat upper respiratory infections. Take as directed. Return in 2 to 3 days if no improvement.   I have also sent you a prescription for cough syrup and refilled your inhaler.  It is very important for you to pay attention to any new symptoms or worsening of your current condition. Please go directly to the Emergency Department immediately should you begin to have any of the following symptoms: shortness of breath, chest pain or difficulty breathing.

## 2023-05-07 NOTE — Plan of Care (Signed)
[facility] Tonsillectomy/Adenoidectomy, Postoperative PEDS care plan entered in error.

## 2023-05-10 ENCOUNTER — Encounter (INDEPENDENT_AMBULATORY_CARE_PROVIDER_SITE_OTHER): Payer: Self-pay | Admitting: Family Medicine

## 2023-05-10 ENCOUNTER — Encounter (HOSPITAL_COMMUNITY): Payer: Self-pay | Admitting: Emergency Medicine

## 2023-05-10 ENCOUNTER — Ambulatory Visit (INDEPENDENT_AMBULATORY_CARE_PROVIDER_SITE_OTHER): Payer: Medicare HMO | Admitting: Family Medicine

## 2023-05-10 ENCOUNTER — Ambulatory Visit (HOSPITAL_COMMUNITY)
Admission: EM | Admit: 2023-05-10 | Discharge: 2023-05-10 | Disposition: A | Discharge status: Home / Self Care | Payer: Medicare HMO | Attending: Emergency Medicine | Admitting: Emergency Medicine

## 2023-05-10 VITALS — BP 157/58 | HR 84 | Temp 98.8°F | Ht 71.0 in | Wt 289.0 lb

## 2023-05-10 DIAGNOSIS — E1169 Type 2 diabetes mellitus with other specified complication: Secondary | ICD-10-CM

## 2023-05-10 DIAGNOSIS — E1159 Type 2 diabetes mellitus with other circulatory complications: Secondary | ICD-10-CM

## 2023-05-10 DIAGNOSIS — Z6841 Body Mass Index (BMI) 40.0 and over, adult: Secondary | ICD-10-CM

## 2023-05-10 DIAGNOSIS — M62838 Other muscle spasm: Secondary | ICD-10-CM

## 2023-05-10 DIAGNOSIS — Z7985 Long-term (current) use of injectable non-insulin antidiabetic drugs: Secondary | ICD-10-CM

## 2023-05-10 DIAGNOSIS — E785 Hyperlipidemia, unspecified: Secondary | ICD-10-CM | POA: Diagnosis not present

## 2023-05-10 DIAGNOSIS — I152 Hypertension secondary to endocrine disorders: Secondary | ICD-10-CM

## 2023-05-10 MED ORDER — BACLOFEN 5 MG PO TABS: 5.0000 mg | ORAL_TABLET | Freq: Two times a day (BID) | ORAL | 0 refills | Status: DC

## 2023-05-10 MED ORDER — MELOXICAM 7.5 MG PO TABS: 7.5000 mg | ORAL_TABLET | Freq: Every day | ORAL | 0 refills | Status: DC

## 2023-05-10 NOTE — Progress Notes (Signed)
Daniel Powers, D.O.  ABFM, ABOM Specializing in Clinical Bariatric Medicine  Office located at: [address] [place], [place]  [address]   Assessment and Plan:   Hypertension associated with type 2 diabetes mellitus Daniel Powers) Assessment & Plan: BP Readings from Last 3 Encounters:  05/10/23 (!) 144/84  05/10/23 (!) 157/58  05/04/23 138/81   HTN treated with Norvasc and Benicar. Blood pressure above target today -  pt feels that this is secondary to left inguinal pain he's experiencing. This pain developed 2 days ago and has been increasingly worse. Pain worsens when standing. The pain does not radiate to other parts of his body. Pain is a 6-7/10. He denies nausea, GI upset, or constipation. Pt endorses no hx of hernia. We recommend pt to go to an urgent care after today's office visit to be evaluated. Continue with all antihypertensives.    Type 2 diabetes mellitus with obesity (HCC) Assessment & Plan: Pt has not started Mounjaro 2.5 mg, which I wrote for on 02/15/23. Appears to be apprehensive. Had long discussion with pt about potential benefits of Mounjaro from a cardiac, renal, and weight loss standpoint. Pt understands not to start Mounjaro until left inguinal pain has resolved. Continue prescribed nutritional plan and weight loss efforts.    Hyperlipidemia associated with type 2 diabetes mellitus (HCC)- low HDL Assessment & Plan: Hyperlipidemia treated with Lipitor 10 mg daily and Fish Oil. Pt had CT Cardiac scoring done on 02/27/2023. Clinical impression was: Coronary calcium score of 209. This was 73 percentile for age-, race-, and sex-matched controls.  We discussed several lifestyle modifications today and he will continue to work on diet (decrease saturated and trans fats), exercise, improving cardiovascular fitness, and weight loss efforts. Continue with fish oil supplement and statin therapy.    TREATMENT PLAN FOR OBESITY: BMI 40.0-44.9, adult (HCC) - current BMI  40.33 Morbid obesity (HCC)-start bmi 43.10/DATE 12/25/2018 Assessment & Plan: Daniel Powers is here to discuss his progress with his obesity treatment plan along with follow-up of his obesity related diagnoses. See Medical Weight Management Flowsheet for complete bioelectrical impedance results.  Since last office visit on 03/12/23 patient's Muscle mass has decreased by 0.4 lb. Fat mass has decreased by 4.2 lb. Total body water has decreased by 0.4 lb.  Counseling done on how various foods will affect these numbers and how to maximize success  Total lbs lost to date: 20 lbs  Total weight loss percentage to date: 6.47%   No change to meal plan - see Subjective  Behavioral Intervention Additional resources provided today: n/a Evidence-based interventions for health behavior change were utilized today including the discussion of self monitoring techniques, problem-solving barriers and SMART goal setting techniques.   Regarding patient's less desirable eating habits and patterns, we employed the technique of small changes.  Pt will specifically work on: n/a  FOLLOW UP: Return 06/11/23. He was informed of the importance of frequent follow up visits to maximize his success with intensive lifestyle modifications for his multiple health conditions.  Subjective:   Chief complaint: Obesity Daniel Powers is here to discuss his progress with his obesity treatment plan. He is on the Category 1 Plan with B & L options and states he is following his eating plan approximately 50% of the time. He states he is not exercising.   Interval History:  Daniel Powers is here for a follow up office visit. Since last OV,  Daniel Powers endorses not being able to exercise and follow his  meal plan very closely due to bronchitis. He reports that he is "95% recovered now". Today, he c/o new onset left inguinal pain (see hypertension note). Pt is down 6 lbs.   Review of Systems:  Pertinent positives were addressed with patient  today.  Reviewed by clinician on day of visit: allergies, medications, problem list, medical history, surgical history, family history, social history, and previous encounter notes.  Weight Summary and Biometrics   Weight Lost Since Last Visit: 5lb  Weight Gained Since Last Visit: 0lb   Vitals Temp: 98.1 F (36.7 C) BP: (!) 144/84 Pulse Rate: 68 SpO2: 97 %   Anthropometric Measurements Height: 5\' 11"  (1.803 m) Weight: 289 lb (131.1 kg) BMI (Calculated): 40.33 Weight at Last Visit: 294lb Weight Lost Since Last Visit: 5lb Weight Gained Since Last Visit: 0lb Starting Weight: 309lb Total Weight Loss (lbs): 20 lb (9.072 kg) Peak Weight: 340lb   Body Composition  Body Fat %: 36.8 % Fat Mass (lbs): 106.4 lbs Muscle Mass (lbs): 174 lbs Total Body Water (lbs): 132.6 lbs Visceral Fat Rating : 25   Other Clinical Data Fasting: no Labs: no Today's Visit #: 53 Starting Date: 12/25/18   Objective:   PHYSICAL EXAM: Blood pressure (!) 157/58, pulse 84, temperature 98.8 F (37.1 C), height 5\' 11"  (1.803 m), weight 289 lb (131.1 kg), SpO2 97%. Body mass index is 40.31 kg/m.  General: Well Developed, well nourished, and in no acute distress.  HEENT: Normocephalic, atraumatic Skin: Warm and dry, cap RF less 2 sec, good turgor Chest:  Normal excursion, shape, no gross abn Respiratory: speaking in full sentences, no conversational dyspnea NeuroM-Sk: Ambulates w/o assistance, moves * 4 Psych: A and O *3, insight good, mood-full  DIAGNOSTIC DATA REVIEWED:  BMET    Component Value Date/Time   NA 138 01/09/2023 1353   NA 140 04/12/2022 0952   K 4.2 01/09/2023 1353   CL 104 01/09/2023 1353   CO2 27 01/09/2023 1353   GLUCOSE 85 01/09/2023 1353   GLUCOSE 85 05/07/2006 1230   BUN 14 01/09/2023 1353   BUN 17 04/12/2022 0952   CREATININE 0.94 01/09/2023 1353   CREATININE 0.85 08/01/2013 1250   CALCIUM 9.3 01/09/2023 1353   GFRNONAA 90 05/31/2020 0912   GFRAA 104  05/31/2020 0912   Lab Results  Component Value Date   HGBA1C 5.7 01/09/2023   HGBA1C 6.2 02/01/2018   Lab Results  Component Value Date   INSULIN 9.6 04/12/2022   INSULIN 9.5 12/25/2018   Lab Results  Component Value Date   TSH 0.85 01/09/2023   CBC    Component Value Date/Time   WBC 6.9 01/09/2023 1353   RBC 4.97 01/09/2023 1353   HGB 14.0 01/09/2023 1353   HGB 14.5 10/03/2021 0749   HGB 12.7 (L) 10/10/2010 0935   HCT 43.4 01/09/2023 1353   HCT 43.8 10/03/2021 0749   HCT 39.2 10/10/2010 0935   PLT 201.0 01/09/2023 1353   PLT 191 10/03/2021 0749   MCV 87.3 01/09/2023 1353   MCV 86 10/03/2021 0749   MCV 77 (L) 10/10/2010 0935   MCH 28.4 10/03/2021 0749   MCH 27.9 09/09/2016 1436   MCH 24.9 (L) 10/10/2010 0935   MCH 26.8 02/28/2010 2240   MCHC 32.2 01/09/2023 1353   RDW 13.8 01/09/2023 1353   RDW 13.5 10/03/2021 0749   RDW 16.3 (H) 10/10/2010 0935   Iron Studies No results found for: "IRON", "TIBC", "FERRITIN", "IRONPCTSAT" Lipid Panel     Component Value Date/Time  CHOL 115 01/09/2023 1353   CHOL 128 04/12/2022 0952   TRIG 64.0 01/09/2023 1353   HDL 38.80 (L) 01/09/2023 1353   HDL 45 04/12/2022 0952   CHOLHDL 3 01/09/2023 1353   VLDL 12.8 01/09/2023 1353   LDLCALC 63 01/09/2023 1353   LDLCALC 70 04/12/2022 0952   Hepatic Function Panel     Component Value Date/Time   PROT 6.4 01/09/2023 1353   PROT 6.9 04/12/2022 0952   ALBUMIN 3.8 01/09/2023 1353   ALBUMIN 4.2 04/12/2022 0952   AST 12 01/09/2023 1353   ALT 11 01/09/2023 1353   ALKPHOS 37 (L) 01/09/2023 1353   BILITOT 0.7 01/09/2023 1353   BILITOT 0.8 04/12/2022 0952   BILIDIR 0.1 11/26/2014 1049      Component Value Date/Time   TSH 0.85 01/09/2023 1353   Nutritional Lab Results  Component Value Date   VD25OH 71.25 01/09/2023   VD25OH 74.2 10/05/2022   VD25OH 75.0 04/12/2022    Attestations:   I, Daniel Powers, acting as a Stage manager for [employer], DO., have compiled all  relevant documentation for today's office visit on behalf of Daniel Lot, DO, while in the presence of [employer], DO.  I have reviewed the above documentation for accuracy and completeness, and I agree with the above. Daniel Powers, D.O.  The 21st Century Cures Act was signed into law in 2016 which includes the topic of electronic health records.  This provides immediate access to information in MyChart.  This includes consultation notes, operative notes, office notes, lab results and pathology reports.  If you have any questions about what you read please let us know at your next visit so we can discuss your concerns and take corrective action if need be.  We are right here with you.

## 2023-05-10 NOTE — ED Provider Notes (Signed)
[facility]    CSN: [id number] Arrival date & time: 05/10/23  0831      History   Chief Complaint Chief Complaint  Patient presents with   Spasms    HPI Daniel Powers is a 67 y.o. male.  Here with 3-4 day history of muscle spasm of the left side Located by his hip Not having pain, just feels a spasm. Reports history of this that felt similar Denies injury, trauma, or fall No interventions yet No urinary symptoms. Normal BMs. No NV   Currently being treated for persistent cough with doxy, has taken 6 days so far. Cough is almost resolved.  Past Medical History:  Diagnosis Date   Anxiety    Arthritis    Asthma    COPD (chronic obstructive pulmonary disease) (HCC)    DVT (deep venous thrombosis) (HCC)    Gout    High cholesterol    Hypertension    Influenza with pneumonia    Lactose intolerance    Obesity    Pre-diabetes    Psychosexual dysfunction with inhibited sexual excitement     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   Preventative use of agents affecting estrogen receptors or levels 11/26/2014   Preventative health care 11/26/2014   Class 3 severe obesity with serious comorbidity and body mass index (BMI) of 40.0 to 44.9 in adult (HCC) 12/02/2013   Morbid obesity (HCC) 08/08/2013   Long term (current) use of anticoagulants  08/08/2013   Hyperlipidemia 10/22/2012   History of colonic polyps 01/02/2012   Musculoskeletal pain 01/02/2012   Anxiety 11/21/2011   OSA (obstructive sleep apnea) 11/21/2011   Special screening for malignant neoplasms, colon 09/20/2011   DVT, HX OF 09/12/2010   Acute thromboembolism of deep veins of lower extremity (HCC) 03/03/2010   GOUT 09/06/2009   COSTOCHONDRITIS 09/06/2009   Superficial foreign body of hand without major open wound 01/16/2008   LEG PAIN, RIGHT 01/14/2008   Pneumonia due to other specified bacteria (HCC) 10/09/2007   INFLUENZA WITH PNEUMONIA 10/09/2007   ERECTILE DYSFUNCTION 09/02/2006   Hypertension associated with type 2 diabetes mellitus (HCC) 09/02/2006   Allergic rhinitis 09/02/2006    Past Surgical History:  Procedure Laterality Date   HAND SURGERY     right   TONSILLECTOMY AND ADENOIDECTOMY         Home Medications    Prior to Admission medications   Medication Sig Start Date End Date Taking? Authorizing Provider  baclofen 5 MG TABS Take 1 tablet (5 mg total) by mouth 2 (two) times daily. 05/10/23  Yes Zarahi Fuerst, Lurena Joiner, PA-C  meloxicam (MOBIC) 7.5 MG tablet Take 1 tablet (7.5 mg total) by mouth daily. 05/10/23  Yes Tiegan Terpstra, Lurena Joiner, PA-C  albuterol (PROAIR HFA) 108 (90 Base) MCG/ACT inhaler Inhale 2 puffs into the lungs every 6 (six) hours as needed for wheezing or shortness of breath. 05/04/23  Hermanns, Ashlee P, PA-C  amLODipine (NORVASC) 5 MG tablet TAKE 1 TABLET(5 MG) BY MOUTH DAILY 01/19/23   Zola Button, Grayling Congress, DO  cetirizine (ZYRTEC) 10 MG tablet Take 10 mg by mouth daily.    [provider]  Cholecalciferol (VITAMIN D3) 50 MCG (2000 UT) CAPS Take 2 capsules (4,000 Units total) by mouth daily. 10/05/22   Opalski, Deborah, DO  CINNAMON PO Take 1 tablet by mouth daily.    [provider]  cyanocobalamin (VITAMIN B12) 500 MCG tablet Take 1 tablet (500 mcg total) by mouth daily. 10/05/22   Opalski, Gavin Pound, DO  doxycycline  (VIBRAMYCIN) 100 MG capsule Take 1 capsule (100 mg total) by mouth 2 (two) times daily for 10 days. 05/04/23 05/14/23  Hermanns, Ashlee P, PA-C  ELIQUIS 5 MG TABS tablet TAKE 1 TABLET(5 MG) BY MOUTH TWICE DAILY 04/18/23   Zola Button, Grayling Congress, DO  fish oil-omega-3 fatty acids 1000 MG capsule Take 1 g by mouth daily.    [provider]  Flaxseed, Linseed, 1000 MG CAPS Take 1 capsule by mouth daily.    [provider]  fluticasone (FLONASE) 50 MCG/ACT nasal spray Place 2 sprays into both nostrils daily. 09/09/21   Sharlene Dory, DO  olmesartan (BENICAR) 40 MG tablet TAKE 1 TABLET(40 MG) BY MOUTH DAILY 03/20/23   Zola Button, Grayling Congress, DO  promethazine-dextromethorphan (PROMETHAZINE-DM) 6.25-15 MG/5ML syrup Take 5 mLs by mouth 4 (four) times daily as needed for cough. 05/04/23   Hermanns, Ashlee P, PA-C  RSV vaccine recomb adjuvanted (AREXVY) 120 MCG/0.5ML injection Inject into the muscle. 06/20/22   Judyann Munson, MD  tadalafil (CIALIS) 20 MG tablet Take 20 mg by mouth daily as needed.    [provider]  tirzepatide Greggory Keen) 2.5 MG/0.5ML Pen Inject 2.5 mg into the skin once a week. 02/15/23   Thomasene Lot, DO    Family History Family History  Problem Relation Age of Onset   Hypertension Mother    Alzheimer's disease Mother    Hypertension Father    Alcohol abuse Father    Obesity Father    Cancer Sister        "male organs"   Stroke Brother    Heart disease Brother 44       MI   Hypertension Maternal Grandmother    Alzheimer's disease Maternal Grandmother    Hypertension Maternal Grandfather    Hypertension Paternal Grandmother    Hypertension Paternal Grandfather    Lung cancer Maternal Uncle    Esophageal cancer Maternal Uncle    Alzheimer's disease Paternal Aunt    Cancer Niece    Colon cancer Neg Hx     Social History Social History   Tobacco Use   Smoking status: Never   Smokeless tobacco: Never  Substance Use Topics   Alcohol  use: Yes    Comment: ocass- once or twice a month   Drug use: No     Allergies   Penicillins   Review of Systems Review of Systems Per HPI  Physical Exam Triage Vital Signs ED Triage Vitals  Encounter Vitals Group     BP 05/10/23 0853 (!) 144/84     Systolic BP Percentile --      Diastolic BP Percentile --      Pulse Rate 05/10/23 0853 68     Resp 05/10/23 0853 17     Temp 05/10/23 0853 98.1 F (36.7 C)     Temp Source 05/10/23 0853 Oral  SpO2 05/10/23 0853 97 %     Weight --      Height --      Head Circumference --      Peak Flow --      Pain Score 05/10/23 0851 6     Pain Loc --      Pain Education --      Exclude from Growth Chart --    No data found.  Updated Vital Signs BP (!) 144/84 (BP Location: Left Arm)   Pulse 68   Temp 98.1 F (36.7 C) (Oral)   Resp 17   SpO2 97%    Physical Exam Vitals and nursing note reviewed.  Constitutional:      General: He is not in acute distress. HENT:     Mouth/Throat:     Pharynx: Oropharynx is clear.  Cardiovascular:     Rate and Rhythm: Normal rate and regular rhythm.     Pulses: Normal pulses.     Heart sounds: Normal heart sounds.  Pulmonary:     Effort: Pulmonary effort is normal.     Breath sounds: Normal breath sounds.  Abdominal:     General: There is no distension.     Palpations: Abdomen is soft.     Tenderness: There is no abdominal tenderness. There is no right CVA tenderness or left CVA tenderness.     Comments: There is no abdominal or CVA tenderness  Musculoskeletal:     Comments: Muscle spasm over the left hip. There is no bony tenderness. Patient with full ROM of the lower extremities, although experiences spasm with left leg lift. No spinal tenderness C-L spine. Distal sensation intact, strength normal. Gait is intact  Skin:    General: Skin is warm and dry.     Capillary Refill: Capillary refill takes less than 2 seconds.     Comments: No rash or lesions on skin   Neurological:      Mental Status: He is alert and oriented to person, place, and time.     UC Treatments / Results  Labs (all labs ordered are listed, but only abnormal results are displayed) Labs Reviewed - No data to display  EKG  Radiology No results found.  Procedures Procedures   Medications Ordered in UC Medications - No data to display  Initial Impression / Assessment and Plan / UC Course  I have reviewed the triage vital signs and the nursing notes.  Pertinent labs & imaging results that were available during my care of the patient were reviewed by me and considered in my medical decision making (see chart for details).  Well appearing, no red flags Symptoms are consistent with muscle spasm. Isolated to the left side. There is no involvement of back or abdomen. No bony tenderness or injury to indicate xray today. Recommend trial of baclofen 5 mg BID with drowsy precautions, once daily mobic 7.5 mg for any inflammation, hot pad and gentle stretching. Advised return precautions and follow with ortho if persisting or hip pain develops. Patient agreeable to plan  Final Clinical Impressions(s) / UC Diagnoses   Final diagnoses:  Muscle spasm     Discharge Instructions      You can take the muscle relaxer (Baclofen) twice daily. If the medication makes you drowsy, take only at bed time.  The mobic is an anti-inflammatory medicine that can be taken once daily. Take with food.  You can also try hot pad to the area.  If no improvement in the next  3-4 days, please return. I also recommend following up with orthopedics.      ED Prescriptions     Medication Sig Dispense Auth. Provider   baclofen 5 MG TABS Take 1 tablet (5 mg total) by mouth 2 (two) times daily. 20 tablet Mendy Lapinsky, PA-C   meloxicam (MOBIC) 7.5 MG tablet Take 1 tablet (7.5 mg total) by mouth daily. 14 tablet Krystian Younglove, Lurena Joiner, PA-C      PDMP not reviewed this encounter.   Capers Hagmann, Lurena Joiner, PA-C 05/10/23 1024

## 2023-05-10 NOTE — ED Triage Notes (Signed)
Pt c/o left side abdominal pain that started a few days ago. States it feels like a spasm and has been getting worse

## 2023-05-10 NOTE — Discharge Instructions (Signed)
You can take the muscle relaxer (Baclofen) twice daily. If the medication makes you drowsy, take only at bed time.  The mobic is an anti-inflammatory medicine that can be taken once daily. Take with food.  You can also try hot pad to the area.  If no improvement in the next 3-4 days, please return. I also recommend following up with orthopedics.

## 2023-05-21 ENCOUNTER — Other Ambulatory Visit: Payer: Self-pay | Admitting: Family Medicine

## 2023-05-21 DIAGNOSIS — E785 Hyperlipidemia, unspecified: Secondary | ICD-10-CM

## 2023-05-23 DIAGNOSIS — R972 Elevated prostate specific antigen [PSA]: Secondary | ICD-10-CM | POA: Diagnosis not present

## 2023-05-25 ENCOUNTER — Other Ambulatory Visit: Payer: Self-pay | Admitting: Medical Genetics

## 2023-05-25 DIAGNOSIS — Z006 Encounter for examination for normal comparison and control in clinical research program: Secondary | ICD-10-CM

## 2023-05-30 DIAGNOSIS — N401 Enlarged prostate with lower urinary tract symptoms: Secondary | ICD-10-CM | POA: Diagnosis not present

## 2023-05-30 DIAGNOSIS — N5201 Erectile dysfunction due to arterial insufficiency: Secondary | ICD-10-CM | POA: Diagnosis not present

## 2023-05-30 DIAGNOSIS — R972 Elevated prostate specific antigen [PSA]: Secondary | ICD-10-CM | POA: Diagnosis not present

## 2023-05-30 DIAGNOSIS — R3915 Urgency of urination: Secondary | ICD-10-CM | POA: Diagnosis not present

## 2023-06-11 ENCOUNTER — Ambulatory Visit (INDEPENDENT_AMBULATORY_CARE_PROVIDER_SITE_OTHER): Payer: Medicare HMO | Admitting: Family Medicine

## 2023-06-27 ENCOUNTER — Other Ambulatory Visit (HOSPITAL_COMMUNITY)
Admission: RE | Admit: 2023-06-27 | Discharge: 2023-06-27 | Disposition: A | Discharge status: Home / Self Care | Payer: Medicare HMO | Source: Ambulatory Visit | Attending: Oncology | Admitting: Oncology

## 2023-06-27 DIAGNOSIS — Z006 Encounter for examination for normal comparison and control in clinical research program: Secondary | ICD-10-CM

## 2023-06-28 ENCOUNTER — Ambulatory Visit: Payer: Medicare HMO | Admitting: *Deleted

## 2023-06-28 VITALS — BP 122/74 | HR 67 | Ht 71.0 in | Wt 291.0 lb

## 2023-06-28 DIAGNOSIS — Z23 Encounter for immunization: Secondary | ICD-10-CM | POA: Diagnosis not present

## 2023-06-28 DIAGNOSIS — Z0000 Encounter for general adult medical examination without abnormal findings: Secondary | ICD-10-CM | POA: Diagnosis not present

## 2023-06-28 NOTE — Progress Notes (Signed)
Subjective:   Daniel Powers is a 67 y.o. male who presents for Medicare Annual/Subsequent preventive examination.  Visit Complete: In person  Patient Medicare AWV questionnaire was completed by the patient on 06/22/23; I have confirmed that all information answered by patient is correct and no changes since this date.  Cardiac Risk Factors include: advanced age (>57men, >35 women);male gender;diabetes mellitus;dyslipidemia;hypertension;obesity (BMI >30kg/m2)     Objective:    Today's Vitals   06/28/23 0824 06/28/23 0833  BP: (!) 144/81 122/74  Pulse: 73 67  Weight: 291 lb (132 kg)   Height: 5\' 11"  (1.803 m)    Body mass index is 40.59 kg/m.     06/28/2023    8:27 AM 06/12/2022    8:25 AM 06/06/2021    8:09 AM  Advanced Directives  Does Patient Have a Medical Advance Directive? Yes Yes Yes  Type of Estate agent of [place];Living will Healthcare Power of [employer] of [place];Living will  Does patient want to make changes to medical advance directive? No - Patient declined No - Patient declined   Copy of Healthcare Power of Attorney in Chart? No - copy requested No - copy requested No - copy requested    Current Medications (verified) Outpatient Encounter Medications as of 06/28/2023  Medication Sig   albuterol (PROAIR HFA) 108 (90 Base) MCG/ACT inhaler Inhale 2 puffs into the lungs every 6 (six) hours as needed for wheezing or shortness of breath.   amLODipine (NORVASC) 5 MG tablet TAKE 1 TABLET(5 MG) BY MOUTH DAILY   baclofen 5 MG TABS Take 1 tablet (5 mg total) by mouth 2 (two) times daily.   cetirizine (ZYRTEC) 10 MG tablet Take 10 mg by mouth daily.   Cholecalciferol (VITAMIN D3) 50 MCG (2000 UT) CAPS Take 2 capsules (4,000 Units total) by mouth daily.   CINNAMON PO Take 1 tablet by mouth daily.   cyanocobalamin (VITAMIN B12) 500 MCG tablet Take 1 tablet (500 mcg total) by mouth daily.   ELIQUIS 5 MG TABS tablet TAKE 1 TABLET(5  MG) BY MOUTH TWICE DAILY   fish oil-omega-3 fatty acids 1000 MG capsule Take 1 g by mouth daily.   Flaxseed, Linseed, 1000 MG CAPS Take 1 capsule by mouth daily.   fluticasone (FLONASE) 50 MCG/ACT nasal spray Place 2 sprays into both nostrils daily.   meloxicam (MOBIC) 7.5 MG tablet Take 1 tablet (7.5 mg total) by mouth daily.   olmesartan (BENICAR) 40 MG tablet TAKE 1 TABLET(40 MG) BY MOUTH DAILY   promethazine-dextromethorphan (PROMETHAZINE-DM) 6.25-15 MG/5ML syrup Take 5 mLs by mouth 4 (four) times daily as needed for cough.   tadalafil (CIALIS) 20 MG tablet Take 20 mg by mouth daily as needed.   tirzepatide [facility]) 2.5 MG/0.5ML Pen Inject 2.5 mg into the skin once a week. (Patient not taking: Reported on 06/28/2023)   [DISCONTINUED] RSV vaccine recomb adjuvanted (AREXVY) 120 MCG/0.5ML injection Inject into the muscle.   No facility-administered encounter medications on file as of 06/28/2023.    Allergies (verified) Penicillins   History: Past Medical History:  Diagnosis Date   Allergy    Anxiety    Arthritis    Asthma    COPD (chronic obstructive pulmonary disease) (HCC)    Diabetes mellitus without complication (HCC)    DVT (deep venous thrombosis) (HCC)    Gout    High cholesterol    Hypertension    Influenza with pneumonia    Lactose intolerance    Obesity  Pre-diabetes    Psychosexual dysfunction with inhibited sexual excitement    Past Surgical History:  Procedure Laterality Date   HAND SURGERY     right   TONSILLECTOMY AND ADENOIDECTOMY     Family History  Problem Relation Age of Onset   Hypertension Mother    Alzheimer's disease Mother    Arthritis Mother    Hypertension Father    Alcohol abuse Father    Obesity Father    Cancer Sister        "male organs"   Stroke Brother    Heart disease Brother 12       MI   Hypertension Maternal Grandmother    Alzheimer's disease Maternal Grandmother    Arthritis Maternal Grandmother    Hypertension  Maternal Grandfather    Arthritis Maternal Grandfather    Hypertension Paternal Grandmother    Obesity Paternal Grandmother    Hypertension Paternal Grandfather    Lung cancer Maternal Uncle    Esophageal cancer Maternal Uncle    Alzheimer's disease Paternal Aunt    Cancer Niece    Colon cancer Neg Hx    Social History   Socioeconomic History   Marital status: Married    Spouse name: Audiological scientist   Number of children: 1   Years of education: Not on file   Highest education level: Not on file  Occupational History   Occupation: attorney at Social worker--  w/s    Employer: [employer]  Tobacco Use   Smoking status: Never   Smokeless tobacco: Never  Substance and Sexual Activity   Alcohol use: Not Currently    Alcohol/week: 1.0 standard drink of alcohol    Types: 1 Cans of beer per week    Comment: I may drink one beer every three months   Drug use: No   Sexual activity: Yes    Partners: Female  Other Topics Concern   Not on file  Social History Narrative   Exercise --a little-- walking    Social Drivers of Health   Financial Resource Strain: Low Risk  (06/22/2023)   Overall Financial Resource Strain (CARDIA)    Difficulty of Paying Living Expenses: Not hard at all  Food Insecurity: No Food Insecurity (06/22/2023)   Hunger Vital Sign    Worried About Running Out of Food in the Last Year: Never true    Ran Out of Food in the Last Year: Never true  Transportation Needs: No Transportation Needs (06/22/2023)   PRAPARE - Administrator, Civil Service (Medical): No    Lack of Transportation (Non-Medical): No  Physical Activity: Insufficiently Active (06/22/2023)   Exercise Vital Sign    Days of Exercise per Week: 1 day    Minutes of Exercise per Session: 30 min  Stress: No Stress Concern Present (06/22/2023)   [employer] of Occupational Health - Occupational Stress Questionnaire    Feeling of Stress : Not at all  Social Connections: Socially Integrated (06/22/2023)   Social  Connection and Isolation Panel [NHANES]    Frequency of Communication with Friends and Family: More than three times a week    Frequency of Social Gatherings with Friends and Family: Once a week    Attends Religious Services: More than 4 times per year    Active Member of [employer] or Organizations: Yes    Attends Engineer, structural: More than 4 times per year    Marital Status: Married    Tobacco Counseling Counseling given: Not Answered   Clinical Intake:  Pre-visit preparation completed: Yes  Pain : No/denies pain  BMI - recorded: 40.59 Nutritional Status: BMI > 30  Obese Nutritional Risks: None Diabetes: Yes CBG done?: No Did pt. bring in CBG monitor from home?: No  How often do you need to have someone help you when you read instructions, pamphlets, or other written materials from your doctor or pharmacy?: 1 - Never  Interpreter Needed?: No  Information entered by :: Donne Anon, CMA   Activities of Daily Living    06/22/2023   10:58 AM 06/08/2023   11:04 AM  In your present state of health, do you have any difficulty performing the following activities:  Hearing? 0 0   Vision? 0 0   Difficulty concentrating or making decisions? 0 0   Walking or climbing stairs? 0 0   Dressing or bathing? 0 0   Doing errands, shopping? 0 0   Preparing Food and eating ? N N   Using the Toilet? N N   In the past six months, have you accidently leaked urine? Y N   Do you have problems with loss of bowel control? N N   Managing your Medications? N N   Managing your Finances? N N   Housekeeping or managing your Housekeeping? N N      Patient-reported    Patient Care Team: Zola Button, Grayling Congress, DO as PCP - General Bjorn Pippin, MD as Attending Physician (Urology) Bjorn Pippin, MD as Attending Physician (Urology) Lynden Oxford, MD as Referring Physician (Ophthalmology)  Indicate any recent Medical Services you may have received from other than [facility] providers in the  past year (date may be approximate).     Assessment:   This is a routine wellness examination for M[place]ai.  Hearing/Vision screen No results found.   Goals Addressed   None    Depression Screen    06/28/2023    8:29 AM 06/12/2022    8:25 AM 12/22/2021    8:52 AM 06/06/2021    8:11 AM 12/04/2019    8:42 AM 12/25/2018    8:23 AM 08/05/2018   10:04 AM  PHQ 2/9 Scores  PHQ - 2 Score 0 0 0 0 0 0 0  PHQ- 9 Score      1 0  Exception Documentation      Medical reason     Fall Risk    06/22/2023   10:58 AM 06/08/2023   11:04 AM 06/12/2022    8:25 AM 12/22/2021    8:52 AM 06/06/2021    8:09 AM  Fall Risk   Falls in the past year? 0 0  0 0 0  Number falls in past yr: 0 0  0 0 0  Injury with Fall? 0 0  0 0 0  Risk for fall due to : No Fall Risks  No Fall Risks    Follow up Falls evaluation completed  Falls evaluation completed Falls evaluation completed Falls prevention discussed     Patient-reported    MEDICARE RISK AT HOME: Medicare Risk at Home Any stairs in or around the home?: Yes If so, are there any without handrails?: No Home free of loose throw rugs in walkways, pet beds, electrical cords, etc?: Yes Adequate lighting in your home to reduce risk of falls?: Yes Life alert?: No Use of a cane, walker or w/c?: No Grab bars in the bathroom?: Yes Shower chair or bench in shower?: No Elevated toilet seat or a handicapped toilet?: No  TIMED UP AND GO:  Was the test performed?  Yes  Length of time to ambulate 10 feet: 6 sec Gait steady and fast without use of assistive device    Cognitive Function:        06/28/2023    8:30 AM 06/12/2022    8:29 AM  6CIT Screen  What Year? 0 points 0 points  What month? 0 points 0 points  What time? 0 points 0 points  Count back from 20 0 points 0 points  Months in reverse 0 points 0 points  Repeat phrase 0 points 0 points  Total Score 0 points 0 points    Immunizations Immunization History  Administered Date(s)  Administered   Fluad Quad(high Dose 65+) 06/07/2020, 06/06/2021, 06/12/2022   Influenza Split 05/04/2011   Influenza Whole 07/26/2010   Influenza,inj,Quad PF,6+ Mos 06/03/2013, 06/18/2014, 07/29/2015, 06/29/2016, 05/08/2017, 04/24/2019   Influenza-Unspecified 06/20/2018   PFIZER Comirnaty(Gray Top)Covid-19 Tri-Sucrose Vaccine 11/17/2020   PFIZER(Purple Top)SARS-COV-2 Vaccination 09/07/2019, 10/04/2019, 04/28/2020   PNEUMOCOCCAL CONJUGATE-20 12/06/2020   Pfizer Covid-19 Vaccine Bivalent Booster 41yrs & up 06/13/2021   Pfizer(Comirnaty)Fall Seasonal Vaccine 12 years and older 05/31/2022   Pneumococcal Polysaccharide-23 09/12/2010   Respiratory Syncytial Virus Vaccine,Recomb Aduvanted(Arexvy) 06/20/2022   Td 09/06/2009   Tdap 06/20/2022   Zoster Recombinant(Shingrix) 06/28/2017, 11/08/2017   Zoster, Live 11/26/2014    TDAP status: Up to date  Flu Vaccine status: Completed at today's visit  Pneumococcal vaccine status: Up to date  Covid-19 vaccine status: Information provided on how to obtain vaccines.   Qualifies for Shingles Vaccine? Yes   Zostavax completed Yes   Shingrix Completed?: Yes  Screening Tests Health Maintenance  Topic Date Due   OPHTHALMOLOGY EXAM  10/01/2022   FOOT EXAM  12/23/2022   INFLUENZA VACCINE  02/15/2023   COVID-19 Vaccine (7 - 2024-25 season) 03/18/2023   Medicare Annual Wellness (AWV)  06/13/2023   Diabetic kidney evaluation - Urine ACR  06/21/2023   HEMOGLOBIN A1C  07/11/2023   Diabetic kidney evaluation - eGFR measurement  01/09/2024   Colonoscopy  05/01/2024   DTaP/Tdap/Td (3 - Td or Tdap) 06/20/2032   Pneumonia Vaccine 60+ Years old  Completed   Hepatitis C Screening  Completed   Zoster Vaccines- Shingrix  Completed   HPV VACCINES  Aged Out    Health Maintenance  Health Maintenance Due  Topic Date Due   OPHTHALMOLOGY EXAM  10/01/2022   FOOT EXAM  12/23/2022   INFLUENZA VACCINE  02/15/2023   COVID-19 Vaccine (7 - 2024-25 season)  03/18/2023   Medicare Annual Wellness (AWV)  06/13/2023   Diabetic kidney evaluation - Urine ACR  06/21/2023    Colorectal cancer screening: Type of screening: Colonoscopy. Completed 05/01/17. Repeat every 7 years  Lung Cancer Screening: (Low Dose CT Chest recommended if Age 57-80 years, 20 pack-year currently smoking OR have quit w/in 15years.) does not qualify.   Additional Screening:  Hepatitis C Screening: does qualify; Completed 06/29/16  Vision Screening: Recommended annual ophthalmology exams for early detection of glaucoma and other disorders of the eye. Is the patient up to date with their annual eye exam?  Yes  Who is the provider or what is the name of the office in which the patient attends annual eye exams? Dr. Wyline Mood If pt is not established with a provider, would they like to be referred to a provider to establish care? No .   Dental Screening: Recommended annual dental exams for proper oral hygiene  Diabetic Foot Exam: Diabetic Foot Exam: Overdue, Pt has been advised  about the importance in completing this exam. Pt is scheduled for diabetic foot exam on N/a.  Community Resource Referral / Chronic Care Management: CRR required this visit?  No   CCM required this visit?  No     Plan:     I have personally reviewed and noted the following in the patient's chart:   Medical and social history Use of alcohol, tobacco or illicit drugs  Current medications and supplements including opioid prescriptions. Patient is not currently taking opioid prescriptions. Functional ability and status Nutritional status Physical activity Advanced directives List of other physicians Hospitalizations, surgeries, and ER visits in previous 12 months Vitals Screenings to include cognitive, depression, and falls Referrals and appointments  In addition, I have reviewed and discussed with patient certain preventive protocols, quality metrics, and best practice recommendations. A  written personalized care plan for preventive services as well as general preventive health recommendations were provided to patient.     Donne Anon, CMA   06/28/2023   After Visit Summary: (In Person-Declined) Patient declined AVS at this time.  Nurse Notes: None

## 2023-06-28 NOTE — Patient Instructions (Signed)
Daniel Powers , Thank you for taking time to come for your Medicare Wellness Visit. I appreciate your ongoing commitment to your health goals. Please review the following plan we discussed and let me know if I can assist you in the future.     This is a list of the screening recommended for you and due dates:  Health Maintenance  Topic Date Due   Eye exam for diabetics  10/01/2022   Complete foot exam   12/23/2022   COVID-19 Vaccine (7 - 2024-25 season) 03/18/2023   Yearly kidney health urinalysis for diabetes  06/21/2023   Hemoglobin A1C  07/11/2023   Yearly kidney function blood test for diabetes  01/09/2024   Colon Cancer Screening  05/01/2024   Medicare Annual Wellness Visit  06/27/2024   DTaP/Tdap/Td vaccine (3 - Td or Tdap) 06/20/2032   Pneumonia Vaccine  Completed   Flu Shot  Completed   Hepatitis C Screening  Completed   Zoster (Shingles) Vaccine  Completed   HPV Vaccine  Aged Out    Next appointment: Follow up in one year for your annual wellness visit.   Preventive Care 68 Years and Older, Male Preventive care refers to lifestyle choices and visits with your health care provider that can promote health and wellness. What does preventive care include? A yearly physical exam. This is also called an annual well check. Dental exams once or twice a year. Routine eye exams. Ask your health care provider how often you should have your eyes checked. Personal lifestyle choices, including: Daily care of your teeth and gums. Regular physical activity. Eating a healthy diet. Avoiding tobacco and drug use. Limiting alcohol use. Practicing safe sex. Taking low doses of aspirin every day. Taking vitamin and mineral supplements as recommended by your health care provider. What happens during an annual well check? The services and screenings done by your health care provider during your annual well check will depend on your age, overall health, lifestyle risk factors, and family  history of disease. Counseling  Your health care provider may ask you questions about your: Alcohol use. Tobacco use. Drug use. Emotional well-being. Home and relationship well-being. Sexual activity. Eating habits. History of falls. Memory and ability to understand (cognition). Work and work Astronomer. Screening  You may have the following tests or measurements: Height, weight, and BMI. Blood pressure. Lipid and cholesterol levels. These may be checked every 5 years, or more frequently if you are over 31 years old. Skin check. Lung cancer screening. You may have this screening every year starting at age 82 if you have a 30-pack-year history of smoking and currently smoke or have quit within the past 15 years. Fecal occult blood test (FOBT) of the stool. You may have this test every year starting at age 67. Flexible sigmoidoscopy or colonoscopy. You may have a sigmoidoscopy every 5 years or a colonoscopy every 10 years starting at age 64. Prostate cancer screening. Recommendations will vary depending on your family history and other risks. Hepatitis C blood test. Hepatitis B blood test. Sexually transmitted disease (STD) testing. Diabetes screening. This is done by checking your blood sugar (glucose) after you have not eaten for a while (fasting). You may have this done every 1-3 years. Abdominal aortic aneurysm (AAA) screening. You may need this if you are a current or former smoker. Osteoporosis. You may be screened starting at age 28 if you are at high risk. Talk with your health care provider about your test results, treatment options, and  if necessary, the need for more tests. Vaccines  Your health care provider may recommend certain vaccines, such as: Influenza vaccine. This is recommended every year. Tetanus, diphtheria, and acellular pertussis (Tdap, Td) vaccine. You may need a Td booster every 10 years. Zoster vaccine. You may need this after age 6. Pneumococcal  13-valent conjugate (PCV13) vaccine. One dose is recommended after age 73. Pneumococcal polysaccharide (PPSV23) vaccine. One dose is recommended after age 22. Talk to your health care provider about which screenings and vaccines you need and how often you need them. This information is not intended to replace advice given to you by your health care provider. Make sure you discuss any questions you have with your health care provider. Document Released: 07/30/2015 Document Revised: 03/22/2016 Document Reviewed: 05/04/2015 Elsevier Interactive Patient Education  2017 [employer].  Fall Prevention in the Home Falls can cause injuries. They can happen to people of all ages. There are many things you can do to make your home safe and to help prevent falls. What can I do on the outside of my home? Regularly fix the edges of walkways and driveways and fix any cracks. Remove anything that might make you trip as you walk through a door, such as a raised step or threshold. Trim any bushes or trees on the path to your home. Use bright outdoor lighting. Clear any walking paths of anything that might make someone trip, such as rocks or tools. Regularly check to see if handrails are loose or broken. Make sure that both sides of any steps have handrails. Any raised decks and porches should have guardrails on the edges. Have any leaves, snow, or ice cleared regularly. Use sand or salt on walking paths during winter. Clean up any spills in your garage right away. This includes oil or grease spills. What can I do in the bathroom? Use night lights. Install grab bars by the toilet and in the tub and shower. Do not use towel bars as grab bars. Use non-skid mats or decals in the tub or shower. If you need to sit down in the shower, use a plastic, non-slip stool. Keep the floor dry. Clean up any water that spills on the floor as soon as it happens. Remove soap buildup in the tub or shower regularly. Attach  bath mats securely with double-sided non-slip rug tape. Do not have throw rugs and other things on the floor that can make you trip. What can I do in the bedroom? Use night lights. Make sure that you have a light by your bed that is easy to reach. Do not use any sheets or blankets that are too big for your bed. They should not hang down onto the floor. Have a firm chair that has side arms. You can use this for support while you get dressed. Do not have throw rugs and other things on the floor that can make you trip. What can I do in the kitchen? Clean up any spills right away. Avoid walking on wet floors. Keep items that you use a lot in easy-to-reach places. If you need to reach something above you, use a strong step stool that has a grab bar. Keep electrical cords out of the way. Do not use floor polish or wax that makes floors slippery. If you must use wax, use non-skid floor wax. Do not have throw rugs and other things on the floor that can make you trip. What can I do with my stairs? Do not leave any  items on the stairs. Make sure that there are handrails on both sides of the stairs and use them. Fix handrails that are broken or loose. Make sure that handrails are as long as the stairways. Check any carpeting to make sure that it is firmly attached to the stairs. Fix any carpet that is loose or worn. Avoid having throw rugs at the top or bottom of the stairs. If you do have throw rugs, attach them to the floor with carpet tape. Make sure that you have a light switch at the top of the stairs and the bottom of the stairs. If you do not have them, ask someone to add them for you. What else can I do to help prevent falls? Wear shoes that: Do not have high heels. Have rubber bottoms. Are comfortable and fit you well. Are closed at the toe. Do not wear sandals. If you use a stepladder: Make sure that it is fully opened. Do not climb a closed stepladder. Make sure that both sides of the  stepladder are locked into place. Ask someone to hold it for you, if possible. Clearly mark and make sure that you can see: Any grab bars or handrails. First and last steps. Where the edge of each step is. Use tools that help you move around (mobility aids) if they are needed. These include: Canes. Walkers. Scooters. Crutches. Turn on the lights when you go into a dark area. Replace any light bulbs as soon as they burn out. Set up your furniture so you have a clear path. Avoid moving your furniture around. If any of your floors are uneven, fix them. If there are any pets around you, be aware of where they are. Review your medicines with your doctor. Some medicines can make you feel dizzy. This can increase your chance of falling. Ask your doctor what other things that you can do to help prevent falls. This information is not intended to replace advice given to you by your health care provider. Make sure you discuss any questions you have with your health care provider. Document Released: 04/29/2009 Document Revised: 12/09/2015 Document Reviewed: 08/07/2014 Elsevier Interactive Patient Education  2017 [employer].

## 2023-07-07 LAB — GENECONNECT MOLECULAR SCREEN: Genetic Analysis Overall Interpretation: NEGATIVE

## 2023-07-15 ENCOUNTER — Other Ambulatory Visit: Payer: Self-pay | Admitting: Family Medicine

## 2023-07-15 DIAGNOSIS — E785 Hyperlipidemia, unspecified: Secondary | ICD-10-CM

## 2023-07-15 DIAGNOSIS — I10 Essential (primary) hypertension: Secondary | ICD-10-CM

## 2023-07-17 ENCOUNTER — Other Ambulatory Visit: Payer: Self-pay

## 2023-07-17 DIAGNOSIS — I10 Essential (primary) hypertension: Secondary | ICD-10-CM

## 2023-07-17 MED ORDER — AMLODIPINE BESYLATE 5 MG PO TABS: ORAL_TABLET | ORAL | 1 refills | Status: DC

## 2023-07-24 ENCOUNTER — Ambulatory Visit (INDEPENDENT_AMBULATORY_CARE_PROVIDER_SITE_OTHER): Payer: Medicare HMO | Admitting: Family Medicine

## 2023-07-24 ENCOUNTER — Encounter (INDEPENDENT_AMBULATORY_CARE_PROVIDER_SITE_OTHER): Payer: Self-pay | Admitting: Family Medicine

## 2023-07-24 VITALS — BP 140/80 | HR 85 | Temp 98.4°F | Ht 71.0 in | Wt 282.0 lb

## 2023-07-24 DIAGNOSIS — Z6839 Body mass index (BMI) 39.0-39.9, adult: Secondary | ICD-10-CM | POA: Diagnosis not present

## 2023-07-24 DIAGNOSIS — E538 Deficiency of other specified B group vitamins: Secondary | ICD-10-CM | POA: Diagnosis not present

## 2023-07-24 DIAGNOSIS — I152 Hypertension secondary to endocrine disorders: Secondary | ICD-10-CM | POA: Diagnosis not present

## 2023-07-24 DIAGNOSIS — E1169 Type 2 diabetes mellitus with other specified complication: Secondary | ICD-10-CM

## 2023-07-24 DIAGNOSIS — E1159 Type 2 diabetes mellitus with other circulatory complications: Secondary | ICD-10-CM

## 2023-07-24 DIAGNOSIS — Z7985 Long-term (current) use of injectable non-insulin antidiabetic drugs: Secondary | ICD-10-CM | POA: Diagnosis not present

## 2023-07-24 DIAGNOSIS — E669 Obesity, unspecified: Secondary | ICD-10-CM | POA: Diagnosis not present

## 2023-07-24 DIAGNOSIS — E559 Vitamin D deficiency, unspecified: Secondary | ICD-10-CM | POA: Diagnosis not present

## 2023-07-24 DIAGNOSIS — Z6841 Body Mass Index (BMI) 40.0 and over, adult: Secondary | ICD-10-CM

## 2023-07-24 NOTE — Progress Notes (Signed)
 Daniel Powers, D.O.  ABFM, ABOM Specializing in Clinical Bariatric Medicine  Office located at: [address] [place], [place]  [address]   Assessment and Plan:   FOR THE DISEASE OF OBESITY: BMI 40.0-44.9, adult (HCC) - current BMI 39.35 Morbid obesity (HCC)-start bmi 43.10/DATE 12/25/2018 Assessment & Plan: Since last office visit on 05/10/23 patient's muscle mass has increased by 0.2 lb. Fat mass has decreased by 6.8 lb. Total body water has decreased by 3.6 lb.  Counseling done on how various foods will affect these numbers and how to maximize success  Total lbs lost to date: 27  Total weight loss percentage to date: 8.74%    Recommended Dietary Goals Daniel Powers currently in the action stage of change. As such, his goal Powers to continue weight management plan.  Daniel has agreed to: continue current plan   Behavioral Intervention We discussed the following today: creating time for self-care and managing stress and continue to work on maintaining a reduced calorie state.  Additional resources provided today: None  Evidence-based interventions for health behavior change were utilized today including the discussion of self monitoring techniques, problem-solving barriers and SMART goal setting techniques.   Regarding patient's less desirable eating habits and patterns, we employed the technique of small changes.   Pt will specifically work on: n/a   Recommended Physical Activity Goals Daniel Powers has been advised to work up to 150 minutes of moderate intensity aerobic activity a week and strengthening exercises 2-3 times per week for cardiovascular health, weight loss maintenance and preservation of muscle mass.   Daniel has agreed to : start walking 20 minutes (eventually work up to 60 minutes) , 2-3 days a week.   FOR ASSOCIATED CONDITIONS ADDRESSED TODAY:  Type 2 diabetes mellitus with obesity [facility]) Assessment & Plan: Most recent Hemoglobin A1c and fasting insulin :  Lab  Results  Component Value Date   HGBA1C 5.7 01/09/2023   HGBA1C 6.0 (H) 10/05/2022   HGBA1C 5.7 (H) 04/12/2022   INSULIN  9.6 04/12/2022   INSULIN  11.0 10/03/2021   INSULIN  7.6 09/30/2020    Pt has been reluctant to start his Mounjaro  2.5 mg, which I wrote for on 02/15/23. I again encouraged pt to begin medication from a weight loss and cardiac standpoint. Continue with reduced calorie nutritional plan. Will recheck labs today.   Orders: -     Hemoglobin A1c -     Insulin , random   Hypertension associated with type 2 diabetes mellitus (HCC) Assessment & Plan: Last 3 blood pressure readings in our office are as follows: BP Readings from Last 3 Encounters:  07/24/23 (!) 140/80  06/28/23 122/74  05/10/23 (!) 144/84   Hypertension treated with Norvasc  and Benicar . Blood pressure  high normal. [name] completely asymptomatic. Lifestyle changes such as decreasing salt intake and engaging in a regular exercise program discussed. Continue with all antihypertensives at current dose.   Orders: -     Comprehensive metabolic panel   A87 deficiency Assessment & Plan: Lab Results  Component Value Date   VITAMINB12 802 01/09/2023   Daniel Powers currently on OTC Vitamin B12 500 mcg daily, compliance good. Daniel will continue with supplementation regiment and focus on B12 rich foods. Will recheck levels today.   Orders: -     Vitamin B12   Vitamin D  deficiency Assessment & Plan: Most recent vitamin D : Lab Results  Component Value Date   VD25OH 71.25 01/09/2023   VD25OH 74.2 10/05/2022   VD25OH 75.0 04/12/2022  No issues with OTC Cholecalciferol 2,000 UT twice daily.  Continue with current supplementation regiment. Will recheck labs today.   Orders: -     VITAMIN D  25 Hydroxy (Vit-D Deficiency, Fractures)   Follow up:   Return 09/20/2023. Daniel was informed of the importance of frequent follow up visits to maximize his success with intensive lifestyle modifications for his multiple health  conditions.  Daniel Powers aware that we will review all of his lab results at our next visit.  Daniel Powers aware that if anything Powers critical/ life threatening with the results, we will be contacting him via MyChart prior to the office visit to discuss management.    Subjective:   Chief complaint: Obesity Daniel Powers here to discuss his progress with his obesity treatment plan. Daniel Powers on the Category 1 Plan with B/L options and states Daniel Powers following his eating plan approximately 50% of the time. Daniel states Daniel Powers not exercising.   Interval History: Daniel Powers here for a follow up office visit. Since last OV, Daniel Powers down 7 lbs. Daniel had the Norovius for 4-5 days - symptoms have resolved. Daniel endorses making healthier choices and following his meal plan more closely. His hunger and cravings are pretty well controlled. His clothes feel looser.   Pharmacotherapy for weight loss: Daniel Powers currently taking no anti-obesity medication. Pt has Mounjaro  on hand, however has never started it.   Review of Systems:  Pertinent positives were addressed with patient today.  Reviewed by clinician on day of visit: allergies, medications, problem list, medical history, surgical history, family history, social history, and previous encounter notes.  Weight Summary and Biometrics   Weight Lost Since Last Visit: 7lb  Weight Gained Since Last Visit: 0lb   Vitals Temp: 98.4 F (36.9 C) BP: (!) 140/80 Pulse Rate: 85 SpO2: 98 %   Anthropometric Measurements Height: 5' 11 (1.803 m) Weight: 282 lb (127.9 kg) BMI (Calculated): 39.35 Weight at Last Visit: 289lb Weight Lost Since Last Visit: 7lb Weight Gained Since Last Visit: 0lb Starting Weight: 309lb Total Weight Loss (lbs): 27 lb (12.2 kg) Peak Weight: 340lb   Body Composition  Body Fat %: 35.2 % Fat Mass (lbs): 99.6 lbs Muscle Mass (lbs): 174.2 lbs Total Body Water (lbs): 129 lbs Visceral Fat Rating : 24   Other Clinical Data Fasting:  yes Labs: no Today's Visit #: 54 Starting Date: 12/25/18   Objective:   PHYSICAL EXAM: Blood pressure (!) 140/80, pulse 85, temperature 98.4 F (36.9 C), height 5' 11 (1.803 m), weight 282 lb (127.9 kg), SpO2 98%. Body mass index Powers 39.33 kg/m.  General: Daniel Powers overweight, cooperative and in no acute distress. PSYCH: Has normal mood, affect and thought process.   HEENT: EOMI, sclerae are anicteric. Lungs: Normal breathing effort, no conversational dyspnea. Extremities: Moves * 4 Neurologic: A and O * 3, good insight  DIAGNOSTIC DATA REVIEWED: BMET    Component Value Date/Time   NA 138 01/09/2023 1353   NA 140 04/12/2022 0952   K 4.2 01/09/2023 1353   CL 104 01/09/2023 1353   CO2 27 01/09/2023 1353   GLUCOSE 85 01/09/2023 1353   GLUCOSE 85 05/07/2006 1230   BUN 14 01/09/2023 1353   BUN 17 04/12/2022 0952   CREATININE 0.94 01/09/2023 1353   CREATININE 0.85 08/01/2013 1250   CALCIUM  9.3 01/09/2023 1353   GFRNONAA 90 05/31/2020 0912   GFRAA 104 05/31/2020 0912   Lab Results  Component Value Date  HGBA1C 5.7 01/09/2023   HGBA1C 6.2 02/01/2018   Lab Results  Component Value Date   INSULIN  9.6 04/12/2022   INSULIN  9.5 12/25/2018   Lab Results  Component Value Date   TSH 0.85 01/09/2023   CBC    Component Value Date/Time   WBC 6.9 01/09/2023 1353   RBC 4.97 01/09/2023 1353   HGB 14.0 01/09/2023 1353   HGB 14.5 10/03/2021 0749   HGB 12.7 (L) 10/10/2010 0935   HCT 43.4 01/09/2023 1353   HCT 43.8 10/03/2021 0749   HCT 39.2 10/10/2010 0935   PLT 201.0 01/09/2023 1353   PLT 191 10/03/2021 0749   MCV 87.3 01/09/2023 1353   MCV 86 10/03/2021 0749   MCV 77 (L) 10/10/2010 0935   MCH 28.4 10/03/2021 0749   MCH 27.9 09/09/2016 1436   MCH 24.9 (L) 10/10/2010 0935   MCH 26.8 02/28/2010 2240   MCHC 32.2 01/09/2023 1353   RDW 13.8 01/09/2023 1353   RDW 13.5 10/03/2021 0749   RDW 16.3 (H) 10/10/2010 0935   Iron Studies No results found for: IRON, TIBC,  FERRITIN, IRONPCTSAT Lipid Panel     Component Value Date/Time   CHOL 115 01/09/2023 1353   CHOL 128 04/12/2022 0952   TRIG 64.0 01/09/2023 1353   HDL 38.80 (L) 01/09/2023 1353   HDL 45 04/12/2022 0952   CHOLHDL 3 01/09/2023 1353   VLDL 12.8 01/09/2023 1353   LDLCALC 63 01/09/2023 1353   LDLCALC 70 04/12/2022 0952   Hepatic Function Panel     Component Value Date/Time   PROT 6.4 01/09/2023 1353   PROT 6.9 04/12/2022 0952   ALBUMIN 3.8 01/09/2023 1353   ALBUMIN 4.2 04/12/2022 0952   AST 12 01/09/2023 1353   ALT 11 01/09/2023 1353   ALKPHOS 37 (L) 01/09/2023 1353   BILITOT 0.7 01/09/2023 1353   BILITOT 0.8 04/12/2022 0952   BILIDIR 0.1 11/26/2014 1049      Component Value Date/Time   TSH 0.85 01/09/2023 1353   Nutritional Lab Results  Component Value Date   VD25OH 71.25 01/09/2023   VD25OH 74.2 10/05/2022   VD25OH 75.0 04/12/2022    Attestations:   I, Special Puri, acting as a stage manager for [employer], DO., have compiled all relevant documentation for today's office visit on behalf of Daniel Jenkins, DO, while in the presence of [employer], DO.  Reviewed by clinician on day of visit: allergies, medications, problem list, medical history, surgical history, family history, social history, and previous encounter notes pertinent to patient's obesity diagnosis. I have spent 40 minutes in the care of the patient today including: preparing to see patient (e.g. review and interpretation of tests, old notes ), obtaining and/or reviewing separately obtained history, performing a medically appropriate examination or evaluation, counseling and educating the patient, ordering medications, test or procedures, documenting clinical information in the electronic or other health care record, and independently interpreting results and communicating results to the patient, family, or caregiver   I have reviewed the above documentation for accuracy and completeness, and I  agree with the above. Daniel JINNY Powers, D.O.  The 21st Century Cures Act was signed into law in 2016 which includes the topic of electronic health records.  This provides immediate access to information in MyChart.  This includes consultation notes, operative notes, office notes, lab results and pathology reports.  If you have any questions about what you read please let us  know at your next visit so we can discuss your concerns and  take corrective action if need be.  We are right here with you.

## 2023-07-25 LAB — COMPREHENSIVE METABOLIC PANEL
ALT: 14 [IU]/L (ref 0–44)
AST: 11 [IU]/L (ref 0–40)
Albumin: 4 g/dL (ref 3.9–4.9)
Alkaline Phosphatase: 53 [IU]/L (ref 44–121)
BUN/Creatinine Ratio: 18 (ref 10–24)
BUN: 17 mg/dL (ref 8–27)
Bilirubin Total: 0.9 mg/dL (ref 0.0–1.2)
CO2: 24 mmol/L (ref 20–29)
Calcium: 9.4 mg/dL (ref 8.6–10.2)
Chloride: 104 mmol/L (ref 96–106)
Creatinine, Ser: 0.97 mg/dL (ref 0.76–1.27)
Globulin, Total: 2.5 g/dL (ref 1.5–4.5)
Glucose: 90 mg/dL (ref 70–99)
Potassium: 4.3 mmol/L (ref 3.5–5.2)
Sodium: 140 mmol/L (ref 134–144)
Total Protein: 6.5 g/dL (ref 6.0–8.5)
eGFR: 85 mL/min/{1.73_m2} (ref >59)

## 2023-07-25 LAB — VITAMIN D 25 HYDROXY (VIT D DEFICIENCY, FRACTURES): Vit D, 25-Hydroxy: 68.2 ng/mL (ref 30.0–100.0)

## 2023-07-25 LAB — VITAMIN B12: Vitamin B-12: >2000 pg/mL — ABNORMAL HIGH (ref 232–1245)

## 2023-07-25 LAB — HEMOGLOBIN A1C
Est. average glucose Bld gHb Est-mCnc: 123 mg/dL
Hgb A1c MFr Bld: 5.9 % — ABNORMAL HIGH (ref 4.8–5.6)

## 2023-07-25 LAB — INSULIN, RANDOM: INSULIN: 8.5 u[IU]/mL (ref 2.6–24.9)

## 2023-08-22 ENCOUNTER — Ambulatory Visit
Admission: RE | Admit: 2023-08-22 | Discharge: 2023-08-22 | Disposition: A | Discharge status: Home / Self Care | Payer: Medicare HMO | Source: Ambulatory Visit | Attending: Internal Medicine | Admitting: Internal Medicine

## 2023-08-22 VITALS — BP 143/81 | HR 74 | Temp 98.3°F | Resp 17

## 2023-08-22 DIAGNOSIS — F411 Generalized anxiety disorder: Secondary | ICD-10-CM

## 2023-08-22 DIAGNOSIS — J209 Acute bronchitis, unspecified: Secondary | ICD-10-CM | POA: Diagnosis not present

## 2023-08-22 MED ORDER — DEXAMETHASONE SODIUM PHOSPHATE 10 MG/ML IJ SOLN
10.0000 mg | Freq: Once | INTRAMUSCULAR | Status: AC
  Administered 2023-08-22: 10 mg via INTRAMUSCULAR

## 2023-08-22 MED ORDER — ALBUTEROL SULFATE HFA 108 (90 BASE) MCG/ACT IN AERS: 1.0000 | INHALATION_SPRAY | Freq: Four times a day (QID) | RESPIRATORY_TRACT | 0 refills | Status: AC | PRN

## 2023-08-22 MED ORDER — PROMETHAZINE-DM 6.25-15 MG/5ML PO SYRP: 2.5000 mL | ORAL_SOLUTION | Freq: Every evening | ORAL | 0 refills | Status: DC | PRN

## 2023-08-22 NOTE — ED Provider Notes (Addendum)
 [facility]    CSN: [id number] Arrival date & time: 08/22/23  9061      History   Chief Complaint Chief Complaint  Patient presents with   Anxiety    Cough - Entered by patient    HPI Daniel Powers is a 68 y.o. male.   Patient presents to urgent care for evaluation of cough, congestion, and fatigue that started approximately 1 week ago.  Cough is minimally productive.  Nasal congestion is clear.  He reports intermittent shortness of breath and bilateral chest tightness associated with coughing, otherwise denies any symptoms with rest.  He has not had recent fevers, chills, nausea, vomiting, diarrhea, abdominal pain, rash, dizziness, leg swelling, orthopnea, or recent steroid/antibiotics.  History of type 2 diabetes that is currently diet controlled, most recent hemoglobin A1c 5.9 in January 2025.  History of asthma and COPD, never smoker.  He has access to an albuterol  inhaler and has not used this during this illness.  He takes Eliquis  daily without missed doses due to history of DVT for prevention.  He additionally reports increased generalized anxiety over the last few weeks.  He relates this to the last time he was placed on antianxiety medication approximately 5 to 6 years ago when he had significant life stressors.  Anxiety is worse at nighttime.  He has a primary care provider and does not currently take any anxiolytic medications daily.  He has been taking Tylenol  over-the-counter for cough and cold symptoms.   Anxiety    Past Medical History:  Diagnosis Date   Allergy    Anxiety    Arthritis    Asthma    COPD (chronic obstructive pulmonary disease) (HCC)    Diabetes mellitus without complication (HCC)    DVT (deep venous thrombosis) (HCC)    Gout    High cholesterol    Hypertension    Influenza with pneumonia    Lactose intolerance    Obesity    Pre-diabetes    Psychosexual dysfunction with inhibited sexual excitement     Patient Active Problem List    Diagnosis Date Noted   BMI 40.0-44.9, adult (HCC)-current bmi 40.8 09/07/2022   B12 deficiency 04/12/2022   SOB (shortness of breath) on exertion 04/12/2022   Type 2 diabetes mellitus with obesity (HCC) 04/12/2022   Essential hypertension 03/13/2022   Vitamin D  deficiency 03/28/2021   Diet-controlled diabetes mellitus (HCC) 08/05/2018   Hyperlipidemia associated with type 2 diabetes mellitus (HCC) 08/05/2018   Chest pain 02/01/2018   Mid back pain 02/01/2018   Diabetes mellitus (HCC) 02/01/2018   Need for influenza vaccination 05/08/2017   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   Preventative use of agents affecting estrogen receptors or levels 11/26/2014   Preventative health care 11/26/2014   Class 3 severe obesity with serious comorbidity and body mass index (BMI) of 40.0 to 44.9 in adult (HCC) 12/02/2013   Morbid obesity (HCC) 08/08/2013   Long term (current) use of anticoagulants 08/08/2013   Hyperlipidemia 10/22/2012   History of colonic polyps 01/02/2012   Musculoskeletal pain 01/02/2012   Anxiety 11/21/2011   OSA (obstructive sleep apnea) 11/21/2011   Special screening for malignant neoplasms, colon 09/20/2011   DVT, HX OF 09/12/2010   Acute thromboembolism of deep veins of lower extremity (HCC) 03/03/2010   GOUT 09/06/2009   COSTOCHONDRITIS 09/06/2009   Superficial foreign body of hand without major open wound 01/16/2008   LEG PAIN, RIGHT 01/14/2008   Pneumonia  due to other specified bacteria (HCC) 10/09/2007   INFLUENZA WITH PNEUMONIA 10/09/2007   ERECTILE DYSFUNCTION 09/02/2006   Hypertension associated with type 2 diabetes mellitus (HCC) 09/02/2006   Allergic rhinitis 09/02/2006    Past Surgical History:  Procedure Laterality Date   HAND SURGERY     right   TONSILLECTOMY AND ADENOIDECTOMY         Home Medications    Prior to Admission medications   Medication Sig Start Date End Date Taking? Authorizing  Provider  albuterol  (VENTOLIN  HFA) 108 (90 Base) MCG/ACT inhaler Inhale 1-2 puffs into the lungs every 6 (six) hours as needed for wheezing or shortness of breath. 08/22/23  Yes Enedelia Dorna HERO, FNP  promethazine -dextromethorphan (PROMETHAZINE -DM) 6.25-15 MG/5ML syrup Take 2.5 mLs by mouth at bedtime as needed for cough. 08/22/23  Yes Enedelia Dorna HERO, FNP  amLODipine  (NORVASC ) 5 MG tablet TAKE 1 TABLET(5 MG) BY MOUTH DAILY 07/17/23   Antonio Meth, Jamee SAUNDERS, DO  baclofen  5 MG TABS Take 1 tablet (5 mg total) by mouth 2 (two) times daily. 05/10/23   Rising, Asberry, PA-C  cetirizine (ZYRTEC) 10 MG tablet Take 10 mg by mouth daily.    [provider]  Cholecalciferol (VITAMIN D3) 50 MCG (2000 UT) CAPS Take 2 capsules (4,000 Units total) by mouth daily. 10/05/22   Opalski, Deborah, DO  CINNAMON PO Take 1 tablet by mouth daily.    [provider]  cyanocobalamin  (VITAMIN B12) 500 MCG tablet Take 1 tablet (500 mcg total) by mouth daily. 10/05/22   Midge Sober, DO  ELIQUIS  5 MG TABS tablet TAKE 1 TABLET(5 MG) BY MOUTH TWICE DAILY 04/18/23   Antonio Meth, Yvonne R, DO  fish oil-omega-3 fatty acids 1000 MG capsule Take 1 g by mouth daily.    [provider]  Flaxseed, Linseed, 1000 MG CAPS Take 1 capsule by mouth daily.    [provider]  fluticasone  (FLONASE ) 50 MCG/ACT nasal spray Place 2 sprays into both nostrils daily. 09/09/21   Frann Mabel Mt, DO  meloxicam  (MOBIC ) 7.5 MG tablet Take 1 tablet (7.5 mg total) by mouth daily. 05/10/23   Rising, Asberry, PA-C  olmesartan  (BENICAR ) 40 MG tablet TAKE 1 TABLET(40 MG) BY MOUTH DAILY 03/20/23   Antonio Meth, Yvonne R, DO  tadalafil (CIALIS) 20 MG tablet Take 20 mg by mouth daily as needed.    [provider]  tirzepatide  (MOUNJARO ) 2.5 MG/0.5ML Pen Inject 2.5 mg into the skin once a week. Patient not taking: Reported on 07/24/2023 02/15/23   Midge Sober, DO    Family History Family History  Problem  Relation Age of Onset   Hypertension Mother    Alzheimer's disease Mother    Arthritis Mother    Hypertension Father    Alcohol abuse Father    Obesity Father    Cancer Sister        male organs   Stroke Brother    Heart disease Brother 9       MI   Hypertension Maternal Grandmother    Alzheimer's disease Maternal Grandmother    Arthritis Maternal Grandmother    Hypertension Maternal Grandfather    Arthritis Maternal Grandfather    Hypertension Paternal Grandmother    Obesity Paternal Grandmother    Hypertension Paternal Grandfather    Lung cancer Maternal Uncle    Esophageal cancer Maternal Uncle    Alzheimer's disease Paternal Aunt    Cancer Niece    Colon cancer Neg Hx  Social History Social History   Tobacco Use   Smoking status: Never   Smokeless tobacco: Never  Substance Use Topics   Alcohol use: Not Currently    Alcohol/week: 1.0 standard drink of alcohol    Types: 1 Cans of beer per week    Comment: I may drink one beer every three months   Drug use: No     Allergies   Penicillins   Review of Systems Review of Systems Per HPI  Physical Exam Triage Vital Signs ED Triage Vitals  Encounter Vitals Group     BP 08/22/23 0949 (!) 143/81     Systolic BP Percentile --      Diastolic BP Percentile --      Pulse Rate 08/22/23 0949 74     Resp 08/22/23 0949 17     Temp 08/22/23 0949 98.3 F (36.8 C)     Temp Source 08/22/23 0949 Oral     SpO2 08/22/23 0949 96 %     Weight --      Height --      Head Circumference --      Peak Flow --      Pain Score 08/22/23 0953 0     Pain Loc --      Pain Education --      Exclude from Growth Chart --    No data found.  Updated Vital Signs BP (!) 143/81 (BP Location: Right Arm)   Pulse 74   Temp 98.3 F (36.8 C) (Oral)   Resp 17   SpO2 96%   Visual Acuity Right Eye Distance:   Left Eye Distance:   Bilateral Distance:    Right Eye Near:   Left Eye Near:    Bilateral Near:     Physical  Exam Vitals and nursing note reviewed.  Constitutional:      Appearance: He is not ill-appearing or toxic-appearing.  HENT:     Head: Normocephalic and atraumatic.     Right Ear: Hearing, tympanic membrane, ear canal and external ear normal.     Left Ear: Hearing, tympanic membrane, ear canal and external ear normal.     Nose: Congestion present.     Mouth/Throat:     Lips: Pink.     Mouth: Mucous membranes are moist. No injury or oral lesions.     Dentition: Normal dentition.     Tongue: No lesions.     Pharynx: Oropharynx is clear. Uvula midline. No pharyngeal swelling, oropharyngeal exudate, posterior oropharyngeal erythema, uvula swelling or postnasal drip.     Tonsils: No tonsillar exudate.  Eyes:     General: Lids are normal. Vision grossly intact. Gaze aligned appropriately.     Extraocular Movements: Extraocular movements intact.     Conjunctiva/sclera: Conjunctivae normal.  Neck:     Trachea: Trachea and phonation normal.  Cardiovascular:     Rate and Rhythm: Normal rate and regular rhythm.     Heart sounds: Normal heart sounds, S1 normal and S2 normal.  Pulmonary:     Effort: Pulmonary effort is normal. No respiratory distress.     Breath sounds: Normal breath sounds and air entry. No wheezing, rhonchi or rales.     Comments: Coarse breath sounds throughout.  Speaking in full sentences without difficulty. Chest:     Chest wall: No tenderness.  Musculoskeletal:     Cervical back: Neck supple.     Right lower leg: No edema.     Left lower leg: No edema.  Lymphadenopathy:     Cervical: No cervical adenopathy.  Skin:    General: Skin is warm and dry.     Capillary Refill: Capillary refill takes less than 2 seconds.     Findings: No rash.  Neurological:     General: No focal deficit present.     Mental Status: He is alert and oriented to person, place, and time. Mental status is at baseline.     Cranial Nerves: No dysarthria or facial asymmetry.  Psychiatric:         Mood and Affect: Mood normal.        Speech: Speech normal.        Behavior: Behavior normal.        Thought Content: Thought content normal.        Judgment: Judgment normal.      UC Treatments / Results  Labs (all labs ordered are listed, but only abnormal results are displayed) Labs Reviewed - No data to display  EKG   Radiology No results found.  Procedures Procedures (including critical care time)  Medications Ordered in UC Medications  dexamethasone  (DECADRON ) injection 10 mg (has no administration in time range)    Initial Impression / Assessment and Plan / UC Course  I have reviewed the triage vital signs and the nursing notes.  Pertinent labs & imaging results that were available during my care of the patient were reviewed by me and considered in my medical decision making (see chart for details).   1.  Acute bronchitis Evaluation suggests viral bronchitis.  Low suspicion for focal pneumonia/acute cardiopulmonary abnormality given hemodynamically stable vital signs in clinic and stable lung exam, therefore deferred imaging of the chest wall. Low clinical suspicion for pulmonary embolism, Wells criteria negative (low risk category due to previous DVT).   Dexamethasone  10mg  IM given in clinic instead of prescribing oral steroids given Eliquis  use and history of diabetes.  Recommend treatment with steroid, bronchodilator, cough suppressants for symptomatic relief, and expectorants (mucinex ) as needed- see AVS.    2.  Anxiety state Recommend follow-up with primary care provider to discuss anxiety further and discuss daily antianxiety medication options. Offered low-dose hydroxyzine.  Patient reports he suffers from significant sedation with Benadryl and wishes to defer taking this medication due to concerns for sedative effects.  I am in complete agreement with this and he may follow-up with PCP to discuss other options.  Denies SI/HI.  Counseled patient on  potential for adverse effects with medications prescribed/recommended today, strict ER and return-to-clinic precautions discussed, patient verbalized understanding.    Final Clinical Impressions(s) / UC Diagnoses   Final diagnoses:  Acute bronchitis, unspecified organism  Anxiety state     Discharge Instructions      You have bronchitis which is inflammation of the upper airways in your lungs due to a virus. The following medicines will help with your symptoms.   -I gave you a shot of steroid in the clinic which will help reduce inflammation to the lungs. - You may use albuterol  inhaler 1 to 2 puffs every 4-6 hours as needed for cough, shortness of breath, and wheezing. - Take cough medicines as needed. - Use mucinex  to break up congestion in nose/chest (guaifenesin  600mg  every 12 hours as needed).   If you develop any new or worsening symptoms or do not improve in the next 2 to 3 days, please return.  If your symptoms are severe, please go to the emergency room. Follow-up with PCP as needed.  ED Prescriptions     Medication Sig Dispense Auth. Provider   albuterol  (VENTOLIN  HFA) 108 (90 Base) MCG/ACT inhaler Inhale 1-2 puffs into the lungs every 6 (six) hours as needed for wheezing or shortness of breath. 18 g Mila Pair M, FNP   promethazine -dextromethorphan (PROMETHAZINE -DM) 6.25-15 MG/5ML syrup Take 2.5 mLs by mouth at bedtime as needed for cough. 118 mL Enedelia Dorna HERO, FNP      PDMP not reviewed this encounter.   Enedelia Dorna HERO, FNP 08/22/23 1032    Enedelia Dorna Everetts, [place] 08/22/23 1032

## 2023-08-22 NOTE — ED Triage Notes (Signed)
 Pt c/o congestion and cough for 1 week. He has been taking Flonase . States on Sunday he began feeling anxious especially at night.

## 2023-08-22 NOTE — Discharge Instructions (Addendum)
 You have bronchitis which is inflammation of the upper airways in your lungs due to a virus. The following medicines will help with your symptoms.   -I gave you a shot of steroid in the clinic which will help reduce inflammation to the lungs. - You may use albuterol  inhaler 1 to 2 puffs every 4-6 hours as needed for cough, shortness of breath, and wheezing. - Take cough medicines as needed. - Use mucinex  to break up congestion in nose/chest (guaifenesin  600mg  every 12 hours as needed).   If you develop any new or worsening symptoms or do not improve in the next 2 to 3 days, please return.  If your symptoms are severe, please go to the emergency room. Follow-up with PCP as needed.

## 2023-08-24 ENCOUNTER — Encounter: Payer: Self-pay | Admitting: Family Medicine

## 2023-08-24 ENCOUNTER — Ambulatory Visit (INDEPENDENT_AMBULATORY_CARE_PROVIDER_SITE_OTHER): Payer: Medicare HMO | Admitting: Family Medicine

## 2023-08-24 VITALS — BP 120/80 | HR 72 | Temp 98.5°F | Resp 18 | Ht 71.0 in | Wt 288.4 lb

## 2023-08-24 DIAGNOSIS — E669 Obesity, unspecified: Secondary | ICD-10-CM | POA: Diagnosis not present

## 2023-08-24 DIAGNOSIS — J40 Bronchitis, not specified as acute or chronic: Secondary | ICD-10-CM

## 2023-08-24 DIAGNOSIS — F411 Generalized anxiety disorder: Secondary | ICD-10-CM

## 2023-08-24 DIAGNOSIS — E1169 Type 2 diabetes mellitus with other specified complication: Secondary | ICD-10-CM | POA: Diagnosis not present

## 2023-08-24 DIAGNOSIS — E785 Hyperlipidemia, unspecified: Secondary | ICD-10-CM

## 2023-08-24 DIAGNOSIS — F410 Panic disorder [episodic paroxysmal anxiety] without agoraphobia: Secondary | ICD-10-CM | POA: Diagnosis not present

## 2023-08-24 MED ORDER — ATORVASTATIN CALCIUM 10 MG PO TABS: 10.0000 mg | ORAL_TABLET | Freq: Every day | ORAL | 1 refills | Status: DC

## 2023-08-24 MED ORDER — AZITHROMYCIN 250 MG PO TABS: ORAL_TABLET | ORAL | 0 refills | Status: DC

## 2023-08-24 MED ORDER — LORAZEPAM 0.5 MG PO TABS: 0.5000 mg | ORAL_TABLET | Freq: Three times a day (TID) | ORAL | 0 refills | Status: AC

## 2023-08-24 NOTE — Progress Notes (Signed)
 Established Patient Office Visit  Subjective   Patient ID: Daniel Powers, male    DOB: 07-22-1955  Age: 68 y.o. MRN: [id number]  Chief Complaint  Patient presents with   Anxiety    HPI Discussed the use of AI scribe software for clinical note transcription with the patient, who gave verbal consent to proceed.  History of Present Illness   Daniel Powers is a 68 year old male who presents with anxiety and congestion.  He has been experiencing anxiety and a feeling of dread that began this past weekend, consistent with a panic attack. Symptoms include an inability to sit still, a need to walk, and difficulty sleeping, with only three to four hours of sleep each night. These symptoms improve when he is able to concentrate on activities such as work or watching TV. 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 He describes feeling 'jittery' and believes he may need short-term treatment to calm down.  He also has congestion that started this past weekend, approximately six days ago. He experiences pressure in the sinuses and a stuffy nose, but no fever or sore throat. He was previously coughing, for which he received cough syrup, and the cough has since resolved. He uses Flonase  and Zyrtec for his symptoms.  Regarding his past medical history, he mentions a previous prescription for pravastatin that ran out, and he is unsure if he still needs it. He recalls his last blood work showed good results, with a hemoglobin A1c of 5.9. He is working on weight loss, having been 282 pounds at his last visit, and continues to lose weight slowly.      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   Preventative use of agents affecting estrogen receptors or levels 11/26/2014   Preventative health care 11/26/2014   Class 3 severe obesity with serious comorbidity and body mass index (BMI) of 40.0 to 44.9 in adult (HCC) 12/02/2013   Morbid obesity (HCC) 08/08/2013   Long term (current) use of anticoagulants 08/08/2013   Hyperlipidemia 10/22/2012   History of colonic polyps 01/02/2012   Musculoskeletal pain 01/02/2012   Anxiety 11/21/2011   OSA (obstructive sleep apnea) 11/21/2011   Special screening for malignant neoplasms, colon 09/20/2011   DVT, HX OF 09/12/2010   Acute thromboembolism of deep veins of lower extremity (HCC) 03/03/2010   GOUT 09/06/2009   COSTOCHONDRITIS 09/06/2009   Superficial foreign body of hand without major open wound 01/16/2008   LEG PAIN, RIGHT 01/14/2008   Pneumonia due to other specified bacteria (HCC) 10/09/2007   INFLUENZA WITH PNEUMONIA 10/09/2007   ERECTILE DYSFUNCTION 09/02/2006   Hypertension associated with type 2 diabetes mellitus (HCC) 09/02/2006   Allergic rhinitis 09/02/2006   Past Medical History:  Diagnosis Date   Allergy    Anxiety    Arthritis    Asthma    COPD (chronic obstructive pulmonary disease) (HCC)    Diabetes mellitus without complication (HCC)    DVT (deep venous thrombosis) (HCC)    Gout    High cholesterol    Hypertension    Influenza with pneumonia    Lactose intolerance    Obesity    Pre-diabetes    Psychosexual dysfunction with  inhibited sexual excitement    Past Surgical History:  Procedure Laterality Date   HAND SURGERY     right   TONSILLECTOMY AND ADENOIDECTOMY     Social History   Tobacco Use   Smoking status: Never   Smokeless tobacco: Never  Substance Use Topics   Alcohol use: Not Currently    Alcohol/week: 1.0 standard drink of alcohol    Types: 1 Cans of beer per week    Comment: I may drink one beer every three months   Drug use: No   Social History   Socioeconomic History   Marital status: Married    Spouse name: Audiological Scientist   Number of children: 1   Years of education: Not on file   Highest education level: Professional school degree (e.g., MD, DDS, DVM, JD)  Occupational History   Occupation: attorney at social worker--  w/s    Employer: [employer]  Tobacco Use   Smoking status: Never   Smokeless tobacco: Never  Substance and Sexual Activity   Alcohol use: Not Currently    Alcohol/week: 1.0 standard drink of alcohol    Types: 1 Cans of beer per week    Comment: I may drink one beer every three months   Drug use: No   Sexual activity: Yes    Partners: Female  Other Topics Concern   Not on file  Social History Narrative   Exercise --a little-- walking    Social Drivers of Health   Financial Resource Strain: Low Risk  (08/23/2023)   Overall Financial Resource Strain (CARDIA)    Difficulty of Paying Living Expenses: Not hard at all  Food Insecurity: No Food Insecurity (08/23/2023)   Hunger Vital Sign    Worried About Running Out of Food in the Last Year: Never true    Ran Out of Food in the Last Year: Never true  Transportation Needs: No Transportation Needs (08/23/2023)   PRAPARE - Administrator, Civil Service (Medical): No    Lack of Transportation (Non-Medical): No  Physical Activity: Inactive (08/23/2023)   Exercise Vital Sign    Days of Exercise per Week: 0 days    Minutes of Exercise per Session: 30 min  Stress: Stress Concern Present (08/23/2023)   [employer] of  Occupational Health - Occupational Stress Questionnaire    Feeling of Stress : To some extent  Social Connections: Moderately Integrated (08/23/2023)   Social Connection and Isolation Panel [NHANES]    Frequency of Communication with Friends and Family: Once a week    Frequency of Social Gatherings with Friends and Family: Once a week    Attends Religious Services: More than 4 times per year    Active Member of [employer] or Organizations: Yes    Attends Banker Meetings: More than 4 times per year    Marital Status: Married  Catering Manager Violence: Not At Risk (06/28/2023)   Humiliation, Afraid, Rape, and Kick questionnaire    Fear of Current or Ex-Partner: No    Emotionally Abused: No  Physically Abused: No    Sexually Abused: No   Family Status  Relation Name Status   Mother Nathaneal Sommers Alive   Father Alm Carbon Deceased at age 25       cirrhosis   Sister Lasalle Abee Deceased at age 88   Brother Ronnie D. Feild Alive   [employer] Deceased   MGF Norleen Katherene Trine Deceased   PG[employer] Bessie McKeever Blue Deceased   PGF Ed Carbon Cong Deceased   Mat Uncle  Deceased at age 36   Bruna Nyhan  (Not Specified)   Niece  Deceased at age 16       uterine cancer   Neg Hx  (Not Specified)  No partnership data on file   Family History  Problem Relation Age of Onset   Hypertension Mother    Alzheimer's disease Mother    Arthritis Mother    Hypertension Father    Alcohol abuse Father    Obesity Father    Cancer Sister        male organs   Stroke Brother    Heart disease Brother 54       MI   Hypertension Maternal Grandmother    Alzheimer's disease Maternal Grandmother    Arthritis Maternal Grandmother    Hypertension Maternal Grandfather    Arthritis Maternal Grandfather    Hypertension Paternal Grandmother    Obesity Paternal Grandmother    Hypertension Paternal Grandfather    Lung cancer Maternal Uncle    Esophageal cancer  Maternal Uncle    Alzheimer's disease Paternal Aunt    Cancer Niece    Colon cancer Neg Hx    Allergies  Allergen Reactions   Penicillins     REACTION: Penicillin      Review of Systems  Constitutional:  Negative for fever and malaise/fatigue.  HENT:  Negative for congestion.   Eyes:  Negative for blurred vision.  Respiratory:  Negative for shortness of breath.   Cardiovascular:  Negative for chest pain, palpitations and leg swelling.  Gastrointestinal:  Negative for abdominal pain, blood in stool and nausea.  Genitourinary:  Negative for dysuria and frequency.  Musculoskeletal:  Negative for falls.  Skin:  Negative for rash.  Neurological:  Negative for dizziness, loss of consciousness and headaches.  Endo/Heme/Allergies:  Negative for environmental allergies.  Psychiatric/Behavioral:  Negative for depression. The patient is nervous/anxious.       Objective:     BP 120/80 (BP Location: Left Arm, Patient Position: Sitting)   Pulse 72   Temp 98.5 F (36.9 C) (Oral)   Resp 18   Ht 5' 11 (1.803 m)   Wt 288 lb 6.4 oz (130.8 kg)   SpO2 99%   BMI 40.22 kg/m  BP Readings from Last 3 Encounters:  08/24/23 120/80  08/22/23 (!) 143/81  07/24/23 (!) 140/80   Wt Readings from Last 3 Encounters:  08/24/23 288 lb 6.4 oz (130.8 kg)  07/24/23 282 lb (127.9 kg)  06/28/23 291 lb (132 kg)   SpO2 Readings from Last 3 Encounters:  08/24/23 99%  08/22/23 96%  07/24/23 98%    Physical Exam Vitals and nursing note reviewed.  Constitutional:      General: He is not in acute distress.    Appearance: Normal appearance. He is well-developed. He is obese.  HENT:     Head: Normocephalic and atraumatic.  Eyes:     General: No scleral icterus.  Right eye: No discharge.        Left eye: No discharge.  Cardiovascular:     Rate and Rhythm: Normal rate and regular rhythm.     Heart sounds: No murmur heard. Pulmonary:     Effort: Pulmonary effort is normal. No respiratory  distress.     Breath sounds: Normal breath sounds.  Musculoskeletal:        General: Normal range of motion.     Cervical back: Normal range of motion and neck supple.     Right lower leg: No edema.     Left lower leg: No edema.  Skin:    General: Skin is warm and dry.  Neurological:     General: No focal deficit present.     Mental Status: He is alert and oriented to person, place, and time.  Psychiatric:        Mood and Affect: Mood normal.        Behavior: Behavior normal.        Thought Content: Thought content normal.        Judgment: Judgment normal.      No results found for any visits on 08/24/23.  Last CBC Lab Results  Component Value Date   WBC 6.9 01/09/2023   HGB 14.0 01/09/2023   HCT 43.4 01/09/2023   MCV 87.3 01/09/2023   MCH 28.4 10/03/2021   RDW 13.8 01/09/2023   PLT 201.0 01/09/2023   Last metabolic panel Lab Results  Component Value Date   GLUCOSE 90 07/24/2023   NA 140 07/24/2023   K 4.3 07/24/2023   CL 104 07/24/2023   CO2 24 07/24/2023   BUN 17 07/24/2023   CREATININE 0.97 07/24/2023   EGFR 85 07/24/2023   CALCIUM  9.4 07/24/2023   PROT 6.5 07/24/2023   ALBUMIN 4.0 07/24/2023   LABGLOB 2.5 07/24/2023   AGRATIO 1.6 04/12/2022   BILITOT 0.9 07/24/2023   ALKPHOS 53 07/24/2023   AST 11 07/24/2023   ALT 14 07/24/2023   Last lipids Lab Results  Component Value Date   CHOL 115 01/09/2023   HDL 38.80 (L) 01/09/2023   LDLCALC 63 01/09/2023   TRIG 64.0 01/09/2023   CHOLHDL 3 01/09/2023   Last hemoglobin A1c Lab Results  Component Value Date   HGBA1C 5.9 (H) 07/24/2023   Last thyroid  functions Lab Results  Component Value Date   TSH 0.85 01/09/2023   T3TOTAL 92 12/25/2018   Last vitamin D  Lab Results  Component Value Date   VD25OH 68.2 07/24/2023   Last vitamin B12 and Folate Lab Results  Component Value Date   VITAMINB12 >2000 (H) 07/24/2023   FOLATE 4.5 12/25/2018      The ASCVD Risk score (Arnett DK, et al., 2019)  failed to calculate for the following reasons:   The valid total cholesterol range is 130 to 320 mg/dL    Assessment & Plan:   Problem List Items Addressed This Visit       Unprioritized   Hyperlipidemia   Relevant Medications   atorvastatin  (LIPITOR) 10 MG tablet   Other Relevant Orders   CBC with Differential/Platelet   Comprehensive metabolic panel   Lipid panel   Hemoglobin A1c   Microalbumin / creatinine urine ratio   Type 2 diabetes mellitus with obesity (HCC)   Relevant Medications   atorvastatin  (LIPITOR) 10 MG tablet   Other Relevant Orders   CBC with Differential/Platelet   Comprehensive metabolic panel   Lipid panel   Hemoglobin A1c  Microalbumin / creatinine urine ratio   Other Visit Diagnoses       Generalized anxiety disorder with panic attacks    -  Primary   Relevant Medications   LORazepam  (ATIVAN ) 0.5 MG tablet     Bronchitis       Relevant Medications   azithromycin  (ZITHROMAX  Z-PAK) 250 MG tablet     Assessment and Plan    Anxiety Experiencing dread, jitteriness, and insomnia since the past weekend, likely due to life stressors and possible Flonase  use. Symptoms include inability to sit still and needing to walk around. Seeks short-term relief. Informed about Ativan 's addiction potential and advised on sparing use. Consider non-addictive daily medication if frequent use is needed. Prescribe Ativan  (lorazepam ) as needed, up to three times daily, with the option to break pills in half if needed during the day. Encourage reducing responsibilities at church and work, and increasing exercise.  Sinus Congestion Sinus congestion and pressure for six days without fever or sore throat. Using Flonase  and Zyrtec with some relief. Coughing subsided with cough syrup. Continue Flonase  and Zyrtec. Prescribe Z-Pak (azithromycin ) due to penicillin allergy. Ensure sufficient cough syrup.  Hyperlipidemia Not taking pravastatin due to prescription lapse. Last  cholesterol levels not checked. Will reassess at next appointment. Schedule labs in three months to reassess cholesterol levels.  General Health Maintenance Actively losing weight with good blood sugar control (HbA1c 5.9). Encourage continued weight loss at a slow and steady pace. Reinforce the importance of regular exercise.  Follow-up Follow up in three months for lab work. Monitor Ativan  effectiveness and report back in a couple of weeks.        No follow-ups on file.    Dashia Caldeira R Lowne Chase, DO

## 2023-09-16 ENCOUNTER — Other Ambulatory Visit: Payer: Self-pay | Admitting: Family Medicine

## 2023-09-16 DIAGNOSIS — I10 Essential (primary) hypertension: Secondary | ICD-10-CM

## 2023-09-20 ENCOUNTER — Ambulatory Visit (INDEPENDENT_AMBULATORY_CARE_PROVIDER_SITE_OTHER): Payer: Medicare HMO | Admitting: Family Medicine

## 2023-09-20 ENCOUNTER — Encounter (INDEPENDENT_AMBULATORY_CARE_PROVIDER_SITE_OTHER): Payer: Self-pay | Admitting: Family Medicine

## 2023-09-20 VITALS — BP 132/76 | HR 75 | Temp 98.1°F | Ht 71.0 in | Wt 281.0 lb

## 2023-09-20 DIAGNOSIS — I152 Hypertension secondary to endocrine disorders: Secondary | ICD-10-CM | POA: Diagnosis not present

## 2023-09-20 DIAGNOSIS — E785 Hyperlipidemia, unspecified: Secondary | ICD-10-CM | POA: Diagnosis not present

## 2023-09-20 DIAGNOSIS — F410 Panic disorder [episodic paroxysmal anxiety] without agoraphobia: Secondary | ICD-10-CM | POA: Diagnosis not present

## 2023-09-20 DIAGNOSIS — E538 Deficiency of other specified B group vitamins: Secondary | ICD-10-CM

## 2023-09-20 DIAGNOSIS — E1169 Type 2 diabetes mellitus with other specified complication: Secondary | ICD-10-CM

## 2023-09-20 DIAGNOSIS — E1159 Type 2 diabetes mellitus with other circulatory complications: Secondary | ICD-10-CM

## 2023-09-20 DIAGNOSIS — E559 Vitamin D deficiency, unspecified: Secondary | ICD-10-CM | POA: Diagnosis not present

## 2023-09-20 DIAGNOSIS — Z7985 Long-term (current) use of injectable non-insulin antidiabetic drugs: Secondary | ICD-10-CM

## 2023-09-20 DIAGNOSIS — E669 Obesity, unspecified: Secondary | ICD-10-CM

## 2023-09-20 DIAGNOSIS — F411 Generalized anxiety disorder: Secondary | ICD-10-CM | POA: Diagnosis not present

## 2023-09-20 DIAGNOSIS — Z6839 Body mass index (BMI) 39.0-39.9, adult: Secondary | ICD-10-CM

## 2023-09-20 DIAGNOSIS — Z6841 Body Mass Index (BMI) 40.0 and over, adult: Secondary | ICD-10-CM

## 2023-09-20 MED ORDER — TIRZEPATIDE 2.5 MG/0.5ML ~~LOC~~ SOAJ: 2.5000 mg | SUBCUTANEOUS | 0 refills | Status: DC

## 2023-09-20 MED ORDER — CYANOCOBALAMIN 500 MCG PO TABS: 500.0000 ug | ORAL_TABLET | ORAL | Status: DC

## 2023-09-20 NOTE — Progress Notes (Signed)
 Daniel Powers, D.O.  ABFM, ABOM Specializing in Clinical Bariatric Medicine  Office located at: [address] [place], [place]  [address]   Assessment and Plan:   FOR THE DISEASE OF OBESITY:  BMI 40.0-44.9, adult (HCC) - current BMI 39.21 Morbid obesity (HCC)-start bmi 43.10/DATE 12/25/2018 Assessment & Plan: Since last office visit on 07/24/2023 patient's  Muscle mass has decreased by 1.2 lb. Fat mass has decreased by 0.4 lb. Total body water has increased by 0.8 lb.  Counseling done on how various foods will affect these numbers and how to maximize success  Total lbs lost to date: 28 lbs Total weight loss percentage to date: 9.06%    Recommended Dietary Goals Daniel Powers is currently in the action stage of change. As such, his goal is to continue weight management plan.  He has agreed to: continue current plan   Behavioral Intervention We discussed the following today: continue to work on maintaining a reduced calorie state, getting the recommended amount of protein, incorporating whole foods, making healthy choices, staying well hydrated and practicing mindfulness when eating.  Additional resources provided today:  2 handouts on meditative breathing  Evidence-based interventions for health behavior change were utilized today including the discussion of self monitoring techniques, problem-solving barriers and SMART goal setting techniques.   Regarding patient's less desirable eating habits and patterns, we employed the technique of small changes.   Pt will specifically work on: n/a   Recommended Physical Activity Goals Daniel Powers has been advised to work up to 150 minutes of moderate intensity aerobic activity a week and strengthening exercises 2-3 times per week for cardiovascular health, weight loss maintenance and preservation of muscle mass.   He has agreed to : continue to gradually increase the amount and intensity of exercise routine   Pharmacotherapy See DM note.     FOR ASSOCIATED CONDITIONS ADDRESSED TODAY:   Type 2 diabetes mellitus with obesity [name] Assessment & Plan: Lab Results  Component Value Date   HGBA1C 5.9 (H) 07/24/2023   HGBA1C 5.7 01/09/2023   HGBA1C 6.0 (H) 10/05/2022   INSULIN 8.5 07/24/2023   INSULIN 9.6 04/12/2022   INSULIN 11.0 10/03/2021   Lab Results  Component Value Date   CREATININE 0.97 07/24/2023   BUN 17 07/24/2023   NA 140 07/24/2023   K 4.3 07/24/2023   CL 104 07/24/2023   CO2 24 07/24/2023      Component Value Date/Time   PROT 6.5 07/24/2023 0909   ALBUMIN 4.0 07/24/2023 0909   AST 11 07/24/2023 0909   ALT 14 07/24/2023 0909   ALKPHOS 53 07/24/2023 0909   BILITOT 0.9 07/24/2023 0909   BILIDIR 0.1 11/26/2014 1049    Pt has not started Mounjaro 2.5 mg weekly. He endorses having no hunger/cravings. Lab wise, his Hemoglobin A1c slightly worsened from 5.7 to 5.9. Kidney function, electrolytes, and liver enzymes WNL. Again encouraged use of Mounjaro for the management of his obesity and comorbidities - he will consider initiating it. Will reorder [facility] as the previous script I wrote is likely expired. Continue RCNP.   Relevant Orders:  -     Tirzepatide; Inject 2.5 mg into the skin once a week.  Dispense: 2 mL; Refill: 0   Hypertension associated with type 2 diabetes mellitus (HCC) Assessment & Plan: Last 3 blood pressure readings in our office are as follows: BP Readings from Last 3 Encounters:  09/20/23 132/76  08/24/23 120/80  08/22/23 (!) 143/81   Blood pressure is stable today  on Norvasc and Benicar. Continue medication. Losing 10% of body weight may improve blood pressure control.    Hyperlipidemia associated with type 2 diabetes mellitus (HCC)- low HDL Assessment & Plan: On Lipitor 10 mg daily. Continue to work on nutrition plan -decreasing simple carbohydrates, increasing lean proteins, decreasing saturated fats and cholesterol , avoiding trans fats and exercise as able to promote  weight loss, improve lipids and decrease cardiovascular risks. Encouraged pt to begin GLP-1 therapy.    Generalized anxiety disorder with panic attacks Assessment & Plan: Experiencing anxiety and panic attacks likely due to personal & work stressors. Symptoms include intermittent shortness of breath, difficulty sleeping, and racing thoughts. Was prescribed Ativan by his PCP and has been taking it PRN. He does not feel the anxiety is effecting his eating habits. Discussed risks/benefits of Ativan - recommended him to use it sparingly. Encouraged pt to increase exercise: 10 minutes+ of walking per day. Encouraged 4,7,8 breathing: 10 minutes in the morning, 10 minutes at night.    Vitamin D deficiency Assessment & Plan: Lab Results  Component Value Date   VD25OH 68.2 07/24/2023   VD25OH 71.25 01/09/2023   VD25OH 74.2 10/05/2022   Pt is doing well on OTC Cholecalciferol 2,000 UT twice daily. His Vitamin D levels are at goal. Continue regimen. Recheck periodically.   B12 deficiency Assessment & Plan: Lab Results  Component Value Date   VITAMINB12 >2000 (H) 07/24/2023   Pt reports good compliance and tolerance of OTC B12 500 mcg daily. His B12 levels are elevated. Take OTC B12 every other day. Will continue to monitor levels.   Relevant Orders:  -     Cyanocobalamin; Take 1 tablet (500 mcg total) by mouth every other day.   Follow up:   Return 11/01/2023. He was informed of the importance of frequent follow up visits to maximize his success with intensive lifestyle modifications for his multiple health conditions.  Subjective:   Chief complaint: Obesity Daniel Powers is here to discuss his progress with his obesity treatment plan. He is on the Category 1 Plan with B/L options and states he is following his eating plan approximately 50% of the time. He states he is walking 30 minutes 1 days per week.  Interval History:  Daniel Powers is here for a follow up office visit. Since last OV on  07/24/2023, Daniel Powers is down 1 lb. Experiencing anxiety and panic attacks likely due to personal & work stressors. Symptoms include intermittent shortness of breath, difficulty sleeping, and racing thoughts. Was prescribed Ativan by his PCP and has been taking it PRN. He does not feel the anxiety is effecting his eating habits.   Pharmacotherapy for weight loss: He is currently taking no anti-obesity medication.   Review of Systems:  Pertinent positives were addressed with patient today.  Reviewed by clinician on day of visit: allergies, medications, problem list, medical history, surgical history, family history, social history, and previous encounter notes.  Weight Summary and Biometrics   Weight Lost Since Last Visit: 1 lb  Weight Gained Since Last Visit: 0   Vitals Temp: 98.1 F (36.7 C) BP: 132/76 Pulse Rate: 75 SpO2: 98 %   Anthropometric Measurements Height: 5\' 11"  (1.803 m) Weight: 281 lb (127.5 kg) BMI (Calculated): 39.21 Weight at Last Visit: 282 lb Weight Lost Since Last Visit: 1 lb Weight Gained Since Last Visit: 0 Starting Weight: 309 lb Total Weight Loss (lbs): 28 lb (12.7 kg) Peak Weight: 340 lb   Body Composition  Body Fat %:  35.3 % Fat Mass (lbs): 99.2 lbs Muscle Mass (lbs): 173 lbs Total Body Water (lbs): 129.8 lbs Visceral Fat Rating : 24   Other Clinical Data Fasting: Yes Today's Visit #: 55 Starting Date: 12/25/18   Objective:   PHYSICAL EXAM: Blood pressure 132/76, pulse 75, temperature 98.1 F (36.7 C), height 5\' 11"  (1.803 m), weight 281 lb (127.5 kg), SpO2 98%. Body mass index is 39.19 kg/m.  General: he is overweight, cooperative and in no acute distress. PSYCH: Has normal mood, affect and thought process.   HEENT: EOMI, sclerae are anicteric. Lungs: Normal breathing effort, no conversational dyspnea. Extremities: Moves * 4 Neurologic: A and O * 3, good insight  DIAGNOSTIC DATA REVIEWED: BMET    Component Value Date/Time   NA  140 07/24/2023 0909   K 4.3 07/24/2023 0909   CL 104 07/24/2023 0909   CO2 24 07/24/2023 0909   GLUCOSE 90 07/24/2023 0909   GLUCOSE 85 01/09/2023 1353   GLUCOSE 85 05/07/2006 1230   BUN 17 07/24/2023 0909   CREATININE 0.97 07/24/2023 0909   CREATININE 0.85 08/01/2013 1250   CALCIUM 9.4 07/24/2023 0909   GFRNONAA 90 05/31/2020 0912   GFRAA 104 05/31/2020 0912   Lab Results  Component Value Date   HGBA1C 5.9 (H) 07/24/2023   HGBA1C 6.2 02/01/2018   Lab Results  Component Value Date   INSULIN 8.5 07/24/2023   INSULIN 9.5 12/25/2018   Lab Results  Component Value Date   TSH 0.85 01/09/2023   CBC    Component Value Date/Time   WBC 6.9 01/09/2023 1353   RBC 4.97 01/09/2023 1353   HGB 14.0 01/09/2023 1353   HGB 14.5 10/03/2021 0749   HGB 12.7 (L) 10/10/2010 0935   HCT 43.4 01/09/2023 1353   HCT 43.8 10/03/2021 0749   HCT 39.2 10/10/2010 0935   PLT 201.0 01/09/2023 1353   PLT 191 10/03/2021 0749   MCV 87.3 01/09/2023 1353   MCV 86 10/03/2021 0749   MCV 77 (L) 10/10/2010 0935   MCH 28.4 10/03/2021 0749   MCH 27.9 09/09/2016 1436   MCH 24.9 (L) 10/10/2010 0935   MCH 26.8 02/28/2010 2240   MCHC 32.2 01/09/2023 1353   RDW 13.8 01/09/2023 1353   RDW 13.5 10/03/2021 0749   RDW 16.3 (H) 10/10/2010 0935   Iron Studies No results found for: "IRON", "TIBC", "FERRITIN", "IRONPCTSAT" Lipid Panel     Component Value Date/Time   CHOL 115 01/09/2023 1353   CHOL 128 04/12/2022 0952   TRIG 64.0 01/09/2023 1353   HDL 38.80 (L) 01/09/2023 1353   HDL 45 04/12/2022 0952   CHOLHDL 3 01/09/2023 1353   VLDL 12.8 01/09/2023 1353   LDLCALC 63 01/09/2023 1353   LDLCALC 70 04/12/2022 0952   Hepatic Function Panel     Component Value Date/Time   PROT 6.5 07/24/2023 0909   ALBUMIN 4.0 07/24/2023 0909   AST 11 07/24/2023 0909   ALT 14 07/24/2023 0909   ALKPHOS 53 07/24/2023 0909   BILITOT 0.9 07/24/2023 0909   BILIDIR 0.1 11/26/2014 1049      Component Value Date/Time   TSH  0.85 01/09/2023 1353   Nutritional Lab Results  Component Value Date   VD25OH 68.2 07/24/2023   VD25OH 71.25 01/09/2023   VD25OH 74.2 10/05/2022    Attestations:   I, Special Puri, acting as a Stage manager for [employer], DO., have compiled all relevant documentation for today's office visit on behalf of Thomasene Lot, DO, while in  the presence of [employer], DO.  I have reviewed the above documentation for accuracy and completeness, and I agree with the above. Daniel Powers, D.O.  The 21st Century Cures Act was signed into law in 2016 which includes the topic of electronic health records.  This provides immediate access to information in MyChart.  This includes consultation notes, operative notes, office notes, lab results and pathology reports.  If you have any questions about what you read please let us know at your next visit so we can discuss your concerns and take corrective action if need be.  We are right here with you.

## 2023-10-16 DIAGNOSIS — E113293 Type 2 diabetes mellitus with mild nonproliferative diabetic retinopathy without macular edema, bilateral: Secondary | ICD-10-CM | POA: Diagnosis not present

## 2023-10-16 DIAGNOSIS — H2513 Age-related nuclear cataract, bilateral: Secondary | ICD-10-CM | POA: Diagnosis not present

## 2023-10-16 DIAGNOSIS — E113211 Type 2 diabetes mellitus with mild nonproliferative diabetic retinopathy with macular edema, right eye: Secondary | ICD-10-CM | POA: Diagnosis not present

## 2023-10-16 LAB — HM DIABETES EYE EXAM

## 2023-10-17 ENCOUNTER — Other Ambulatory Visit: Payer: Self-pay | Admitting: Family Medicine

## 2023-10-17 DIAGNOSIS — I82401 Acute embolism and thrombosis of unspecified deep veins of right lower extremity: Secondary | ICD-10-CM

## 2023-11-01 ENCOUNTER — Ambulatory Visit (INDEPENDENT_AMBULATORY_CARE_PROVIDER_SITE_OTHER): Admitting: Family Medicine

## 2023-11-19 ENCOUNTER — Ambulatory Visit (INDEPENDENT_AMBULATORY_CARE_PROVIDER_SITE_OTHER): Admitting: Family Medicine

## 2023-11-19 ENCOUNTER — Encounter: Payer: Self-pay | Admitting: Family Medicine

## 2023-11-19 VITALS — BP 128/80 | HR 67 | Temp 98.1°F | Resp 18 | Ht 71.0 in | Wt 283.2 lb

## 2023-11-19 DIAGNOSIS — J0140 Acute pansinusitis, unspecified: Secondary | ICD-10-CM

## 2023-11-19 MED ORDER — DOXYCYCLINE HYCLATE 100 MG PO TABS: 100.0000 mg | ORAL_TABLET | Freq: Two times a day (BID) | ORAL | 0 refills | Status: DC

## 2023-11-19 MED ORDER — AZELASTINE HCL 0.1 % NA SOLN: 1.0000 | Freq: Two times a day (BID) | NASAL | 12 refills | Status: AC

## 2023-11-19 NOTE — Progress Notes (Signed)
 Established Patient Office Visit  Subjective   Patient ID: Daniel Powers, male    DOB: 11-04-1955  Age: 68 y.o. MRN: [id number]  Chief Complaint  Patient presents with   Nasal Congestion    Pt states having congestion and post nasal. Pt states occ cough. Sxs worse at night     HPI Discussed the use of AI scribe software for clinical note transcription with the patient, who gave verbal consent to proceed.  History of Present Illness Daniel Powers is a 68 year old male who presents with persistent nasal congestion and throat drainage.  He has experienced persistent nasal congestion and drainage down the back of his throat since February. The congestion fluctuates in severity but never fully resolves, causing hoarseness and sleep disturbances. He denies taking any antihistamines or sprays currently, stating that the nasal spray he has does not help.  He occasionally uses Biore for shortness of breath but finds it ineffective. He reports wheezing a couple of times, particularly when talking and changing his breath, but not frequently.  He was previously prescribed a Z-Pak, which did not resolve his symptoms. He was seen in urgent care prior to his last visit but was not given any prescriptions at that time. He is allergic to penicillin. He takes Zyrtec daily, typically only one a day.  No sore throat, but he describes it as scratchy. Occasional coughing is present but not enough to keep him awake. No ear pain.   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   Preventative use of agents affecting estrogen receptors or levels 11/26/2014   Preventative health care 11/26/2014   Class 3 severe obesity with serious comorbidity and body mass index (BMI) of 40.0 to 44.9 in adult 12/02/2013   Morbid obesity (HCC) 08/08/2013   Long term (current) use of anticoagulants 08/08/2013   Hyperlipidemia 10/22/2012   History of colonic polyps 01/02/2012   Musculoskeletal pain 01/02/2012   Anxiety 11/21/2011   OSA (obstructive sleep apnea) 11/21/2011   Special screening for malignant neoplasms, colon 09/20/2011   DVT, HX OF 09/12/2010   Acute thromboembolism of deep veins of lower extremity (HCC) 03/03/2010   GOUT 09/06/2009   COSTOCHONDRITIS 09/06/2009   Superficial foreign body of hand without major open wound 01/16/2008   LEG PAIN, RIGHT 01/14/2008   Pneumonia due to other specified bacteria (HCC) 10/09/2007   INFLUENZA WITH PNEUMONIA 10/09/2007   ERECTILE DYSFUNCTION 09/02/2006   Hypertension associated with type 2 diabetes mellitus (HCC) 09/02/2006   Allergic rhinitis 09/02/2006   Past Medical History:  Diagnosis Date   Allergy    Anxiety    Arthritis    Asthma    COPD (chronic obstructive pulmonary disease) (HCC)    Diabetes mellitus without complication (HCC)    DVT (deep venous thrombosis) (HCC)    Gout    High cholesterol    Hypertension  Influenza with pneumonia    Lactose intolerance    Obesity    Pre-diabetes    Psychosexual dysfunction with inhibited sexual excitement    Past Surgical History:  Procedure Laterality Date   HAND SURGERY     right   TONSILLECTOMY AND ADENOIDECTOMY     Social History   Tobacco Use   Smoking status: Never   Smokeless tobacco: Never  Substance Use Topics   Alcohol use: Not Currently    Alcohol/week: 1.0  standard drink of alcohol    Types: 1 Cans of beer per week    Comment: I may drink one beer every three months   Drug use: No   Social History   Socioeconomic History   Marital status: Married    Spouse name: Audiological scientist   Number of children: 1   Years of education: Not on file   Highest education level: Professional school degree (e.g., MD, DDS, DVM, JD)  Occupational History   Occupation: attorney at Social worker--  w/s    Employer: [employer]  Tobacco Use   Smoking status: Never   Smokeless tobacco: Never  Substance and Sexual Activity   Alcohol use: Not Currently    Alcohol/week: 1.0 standard drink of alcohol    Types: 1 Cans of beer per week    Comment: I may drink one beer every three months   Drug use: No   Sexual activity: Yes    Partners: Female  Other Topics Concern   Not on file  Social History Narrative   Exercise --a little-- walking    Social Drivers of Health   Financial Resource Strain: Low Risk  (08/23/2023)   Overall Financial Resource Strain (CARDIA)    Difficulty of Paying Living Expenses: Not hard at all  Food Insecurity: No Food Insecurity (08/23/2023)   Hunger Vital Sign    Worried About Running Out of Food in the Last Year: Never true    Ran Out of Food in the Last Year: Never true  Transportation Needs: No Transportation Needs (08/23/2023)   PRAPARE - Administrator, Civil Service (Medical): No    Lack of Transportation (Non-Medical): No  Physical Activity: Inactive (08/23/2023)   Exercise Vital Sign    Days of Exercise per Week: 0 days    Minutes of Exercise per Session: 30 min  Stress: Stress Concern Present (08/23/2023)   [employer] of Occupational Health - Occupational Stress Questionnaire    Feeling of Stress : To some extent  Social Connections: Moderately Integrated (08/23/2023)   Social Connection and Isolation Panel [NHANES]    Frequency of Communication with Friends and Family: Once a week    Frequency of Social Gatherings with Friends and  Family: Once a week    Attends Religious Services: More than 4 times per year    Active Member of [employer] or Organizations: Yes    Attends Banker Meetings: More than 4 times per year    Marital Status: Married  Catering manager Violence: Not At Risk (06/28/2023)   Humiliation, Afraid, Rape, and Kick questionnaire    Fear of Current or Ex-Partner: No    Emotionally Abused: No    Physically Abused: No    Sexually Abused: No   Family Status  Relation Name Status   Mother Gordon Trisler Alive   Father Antonia Battiest Deceased at age 57       cirrhosis   Sister Tanisha Edelman Deceased at age 20   Brother  Ronnie D. Routt Alive   MGM Ewing Holiday Idleman Deceased   MGF Mari Shine Deceased   PGM Bessie McKeever Blue Deceased   PGF Ed Lehman Pummel Deceased   Mat Uncle  Deceased at age 52   Bernarda Bride  (Not Specified)   Niece  Deceased at age 24       uterine cancer   Neg Hx  (Not Specified)  No partnership data on file   Family History  Problem Relation Age of Onset   Hypertension Mother    Alzheimer's disease Mother    Arthritis Mother    Hypertension Father    Alcohol abuse Father    Obesity Father    Cancer Sister        "male organs"   Stroke Brother    Heart disease Brother 46       MI   Hypertension Maternal Grandmother    Alzheimer's disease Maternal Grandmother    Arthritis Maternal Grandmother    Hypertension Maternal Grandfather    Arthritis Maternal Grandfather    Hypertension Paternal Grandmother    Obesity Paternal Grandmother    Hypertension Paternal Grandfather    Lung cancer Maternal Uncle    Esophageal cancer Maternal Uncle    Alzheimer's disease Paternal Aunt    Cancer Niece    Colon cancer Neg Hx    Allergies  Allergen Reactions   Penicillins     REACTION: Penicillin      Review of Systems  Constitutional:  Negative for fever and malaise/fatigue.  HENT:  Positive for congestion, sinus pain and sore throat.    Eyes:  Negative for blurred vision.  Respiratory:  Positive for sputum production and wheezing. Negative for cough and shortness of breath.   Cardiovascular:  Negative for chest pain, palpitations and leg swelling.  Gastrointestinal:  Negative for abdominal pain, blood in stool, nausea and vomiting.  Genitourinary:  Negative for dysuria and frequency.  Musculoskeletal:  Negative for back pain and falls.  Skin:  Negative for rash.  Neurological:  Negative for dizziness, loss of consciousness and headaches.  Endo/Heme/Allergies:  Negative for environmental allergies.  Psychiatric/Behavioral:  Negative for depression. The patient is not nervous/anxious.       Objective:     BP 128/80 (BP Location: Left Arm, Patient Position: Sitting, Cuff Size: Large)   Pulse 67   Temp 98.1 F (36.7 C) (Oral)   Resp 18   Ht 5\' 11"  (1.803 m)   Wt 283 lb 3.2 oz (128.5 kg)   SpO2 98%   BMI 39.50 kg/m  BP Readings from Last 3 Encounters:  11/19/23 128/80  09/20/23 132/76  08/24/23 120/80   Wt Readings from Last 3 Encounters:  11/19/23 283 lb 3.2 oz (128.5 kg)  09/20/23 281 lb (127.5 kg)  08/24/23 288 lb 6.4 oz (130.8 kg)   SpO2 Readings from Last 3 Encounters:  11/19/23 98%  09/20/23 98%  08/24/23 99%    Physical Exam Vitals and nursing note reviewed.  Constitutional:      General: He is not in acute distress.    Appearance: Normal appearance. He is well-developed.  HENT:     Head: Normocephalic and atraumatic.     Right Ear: Tympanic membrane, ear canal and external ear normal. There is no impacted cerumen.     Left Ear: Tympanic membrane, ear canal and external ear normal. There is no impacted cerumen.     Nose: Congestion and rhinorrhea present.     Right Sinus:  Maxillary sinus tenderness and frontal sinus tenderness present.     Left Sinus: Maxillary sinus tenderness and frontal sinus tenderness present.     Mouth/Throat:     Mouth: Mucous membranes are moist.     Pharynx:  Oropharynx is clear. No oropharyngeal exudate or posterior oropharyngeal erythema.  Eyes:     General: No scleral icterus.       Right eye: No discharge.        Left eye: No discharge.     Conjunctiva/sclera: Conjunctivae normal.  Cardiovascular:     Rate and Rhythm: Normal rate and regular rhythm.     Heart sounds: Normal heart sounds. No murmur heard. Pulmonary:     Effort: Pulmonary effort is normal. No respiratory distress.     Breath sounds: Normal breath sounds. No wheezing, rhonchi or rales.  Musculoskeletal:        General: Normal range of motion.     Cervical back: Normal range of motion and neck supple.     Right lower leg: No edema.     Left lower leg: No edema.  Lymphadenopathy:     Cervical: Cervical adenopathy present.  Skin:    General: Skin is warm and dry.  Neurological:     Mental Status: He is alert and oriented to person, place, and time.  Psychiatric:        Mood and Affect: Mood normal.        Behavior: Behavior normal.        Thought Content: Thought content normal.        Judgment: Judgment normal.      No results found for any visits on 11/19/23.  Last CBC Lab Results  Component Value Date   WBC 6.9 01/09/2023   HGB 14.0 01/09/2023   HCT 43.4 01/09/2023   MCV 87.3 01/09/2023   MCH 28.4 10/03/2021   RDW 13.8 01/09/2023   PLT 201.0 01/09/2023   Last metabolic panel Lab Results  Component Value Date   GLUCOSE 90 07/24/2023   NA 140 07/24/2023   K 4.3 07/24/2023   CL 104 07/24/2023   CO2 24 07/24/2023   BUN 17 07/24/2023   CREATININE 0.97 07/24/2023   EGFR 85 07/24/2023   CALCIUM  9.4 07/24/2023   PROT 6.5 07/24/2023   ALBUMIN 4.0 07/24/2023   LABGLOB 2.5 07/24/2023   AGRATIO 1.6 04/12/2022   BILITOT 0.9 07/24/2023   ALKPHOS 53 07/24/2023   AST 11 07/24/2023   ALT 14 07/24/2023   Last lipids Lab Results  Component Value Date   CHOL 115 01/09/2023   HDL 38.80 (L) 01/09/2023   LDLCALC 63 01/09/2023   TRIG 64.0 01/09/2023    CHOLHDL 3 01/09/2023   Last hemoglobin A1c Lab Results  Component Value Date   HGBA1C 5.9 (H) 07/24/2023   Last thyroid  functions Lab Results  Component Value Date   TSH 0.85 01/09/2023   T3TOTAL 92 12/25/2018   Last vitamin D  Lab Results  Component Value Date   VD25OH 68.2 07/24/2023   Last vitamin B12 and Folate Lab Results  Component Value Date   VITAMINB12 >2000 (H) 07/24/2023   FOLATE 4.5 12/25/2018      The ASCVD Risk score (Arnett DK, et al., 2019) failed to calculate for the following reasons:   The valid total cholesterol range is 130 to 320 mg/dL    Assessment & Plan:   Problem List Items Addressed This Visit   None Visit Diagnoses       Acute  non-recurrent pansinusitis    -  Primary   Relevant Medications   doxycycline  (VIBRA -TABS) 100 MG tablet   azelastine  (ASTELIN ) 0.1 % nasal spray     Assessment and Plan Assessment & Plan Chronic nasal congestion   He has persistent nasal congestion with postnasal drip and hoarseness since February. Previous azithromycin  treatment was ineffective. There is no significant cough or wheezing, but swollen glands and scratchiness are present. Allergic rhinitis is considered as a differential diagnosis. Due to a penicillin allergy, doxycycline  is prescribed for infection. Astelin  nasal spray is added for postnasal drip, and Zyrtec is continued daily. Prednisone  will be considered if symptoms do not improve, especially with severe congestion or cough.  Allergy to penicillin   He has an allergy to penicillin, necessitating alternative antibiotics.  Prediabetes   Mounjaro  was discussed for weight loss and prediabetes management. He is hesitant due to concerns about new medications. Mounjaro 's effectiveness for weight loss, especially at higher doses, and Medicare coverage for prediabetes were explained. Potential side effects, including nausea, abdominal pain, and constipation, which are generally transient and resolve upon  discontinuation, were discussed. Significant weight loss is more likely at doses higher than 2.5 mg.    No follow-ups on file.    Cheryle Dark R Lowne Chase, DO

## 2023-11-21 ENCOUNTER — Other Ambulatory Visit (INDEPENDENT_AMBULATORY_CARE_PROVIDER_SITE_OTHER): Payer: Medicare HMO

## 2023-11-21 DIAGNOSIS — E785 Hyperlipidemia, unspecified: Secondary | ICD-10-CM

## 2023-11-21 DIAGNOSIS — E1169 Type 2 diabetes mellitus with other specified complication: Secondary | ICD-10-CM

## 2023-11-21 DIAGNOSIS — E669 Obesity, unspecified: Secondary | ICD-10-CM

## 2023-11-21 LAB — COMPREHENSIVE METABOLIC PANEL WITH GFR
ALT: 11 U/L (ref 0–53)
AST: 11 U/L (ref 0–37)
Albumin: 4.1 g/dL (ref 3.5–5.2)
Alkaline Phosphatase: 39 U/L (ref 39–117)
BUN: 21 mg/dL (ref 6–23)
CO2: 26 meq/L (ref 19–32)
Calcium: 9.2 mg/dL (ref 8.4–10.5)
Chloride: 105 meq/L (ref 96–112)
Creatinine, Ser: 1.03 mg/dL (ref 0.40–1.50)
GFR: 74.25 mL/min (ref >60.00)
Glucose, Bld: 98 mg/dL (ref 70–99)
Potassium: 3.9 meq/L (ref 3.5–5.1)
Sodium: 138 meq/L (ref 135–145)
Total Bilirubin: 0.8 mg/dL (ref 0.2–1.2)
Total Protein: 6.5 g/dL (ref 6.0–8.3)

## 2023-11-21 LAB — CBC WITH DIFFERENTIAL/PLATELET
Basophils Absolute: 0 10*3/uL (ref 0.0–0.1)
Basophils Relative: 0.4 % (ref 0.0–3.0)
Eosinophils Absolute: 0.1 10*3/uL (ref 0.0–0.7)
Eosinophils Relative: 2.3 % (ref 0.0–5.0)
HCT: 43.1 % (ref 39.0–52.0)
Hemoglobin: 14.1 g/dL (ref 13.0–17.0)
Lymphocytes Relative: 27.7 % (ref 12.0–46.0)
Lymphs Abs: 1.6 10*3/uL (ref 0.7–4.0)
MCHC: 32.7 g/dL (ref 30.0–36.0)
MCV: 87.6 fl (ref 78.0–100.0)
Monocytes Absolute: 0.5 10*3/uL (ref 0.1–1.0)
Monocytes Relative: 9 % (ref 3.0–12.0)
Neutro Abs: 3.5 10*3/uL (ref 1.4–7.7)
Neutrophils Relative %: 60.6 % (ref 43.0–77.0)
Platelets: 183 10*3/uL (ref 150.0–400.0)
RBC: 4.92 Mil/uL (ref 4.22–5.81)
RDW: 14.3 % (ref 11.5–15.5)
WBC: 5.8 10*3/uL (ref 4.0–10.5)

## 2023-11-21 LAB — LIPID PANEL
Cholesterol: 131 mg/dL (ref 0–200)
HDL: 44.8 mg/dL (ref >39.00)
LDL Cholesterol: 74 mg/dL (ref 0–99)
NonHDL: 85.83
Total CHOL/HDL Ratio: 3
Triglycerides: 58 mg/dL (ref 0.0–149.0)
VLDL: 11.6 mg/dL (ref 0.0–40.0)

## 2023-11-21 LAB — MICROALBUMIN / CREATININE URINE RATIO
Creatinine,U: 271.7 mg/dL
Microalb Creat Ratio: 4.2 mg/g (ref 0.0–30.0)
Microalb, Ur: 1.1 mg/dL (ref 0.0–1.9)

## 2023-11-21 LAB — HEMOGLOBIN A1C: Hgb A1c MFr Bld: 5.8 % (ref 4.6–6.5)

## 2023-11-27 ENCOUNTER — Ambulatory Visit: Payer: Self-pay | Admitting: Family Medicine

## 2023-12-14 ENCOUNTER — Encounter: Payer: Self-pay | Admitting: Pharmacist

## 2023-12-14 NOTE — Progress Notes (Signed)
 Pharmacy Quality Measure Review  This patient is appearing on a report for being at risk of failing the adherence measure for diabetes medications this calendar year.   Medication: Mounjaro  2.5mg   Last fill date: 09/21/2023 for 28 day supply  Spoke with patient today. He has been a little reluctant to start Mounjaro  but he has plans to take his first dose 12/16/2023. Discussed what to expect regarding appetite, side effects, blood glucose.  He will [facility] up [facility] 12/25/2023.  Unfortunately if he fills Mounjaro  again he will fail the diabetes medication adherence measure but I hope he has a good response with Mounjaro  and will continue it.   Cecilie Coffee, PharmD Clinical Pharmacist [facility]  Population Health [phone]

## 2023-12-25 ENCOUNTER — Encounter (INDEPENDENT_AMBULATORY_CARE_PROVIDER_SITE_OTHER): Payer: Self-pay | Admitting: Family Medicine

## 2023-12-25 ENCOUNTER — Ambulatory Visit (INDEPENDENT_AMBULATORY_CARE_PROVIDER_SITE_OTHER): Admitting: Family Medicine

## 2023-12-25 VITALS — BP 126/79 | HR 76 | Temp 98.1°F | Ht 71.0 in | Wt 278.0 lb

## 2023-12-25 DIAGNOSIS — E785 Hyperlipidemia, unspecified: Secondary | ICD-10-CM

## 2023-12-25 DIAGNOSIS — Z6841 Body Mass Index (BMI) 40.0 and over, adult: Secondary | ICD-10-CM

## 2023-12-25 DIAGNOSIS — E669 Obesity, unspecified: Secondary | ICD-10-CM

## 2023-12-25 DIAGNOSIS — Z7985 Long-term (current) use of injectable non-insulin antidiabetic drugs: Secondary | ICD-10-CM | POA: Diagnosis not present

## 2023-12-25 DIAGNOSIS — E1169 Type 2 diabetes mellitus with other specified complication: Secondary | ICD-10-CM

## 2023-12-25 DIAGNOSIS — Z6838 Body mass index (BMI) 38.0-38.9, adult: Secondary | ICD-10-CM | POA: Diagnosis not present

## 2023-12-25 DIAGNOSIS — E1159 Type 2 diabetes mellitus with other circulatory complications: Secondary | ICD-10-CM

## 2023-12-25 DIAGNOSIS — I152 Hypertension secondary to endocrine disorders: Secondary | ICD-10-CM

## 2023-12-25 MED ORDER — TIRZEPATIDE 2.5 MG/0.5ML ~~LOC~~ SOAJ: 2.5000 mg | SUBCUTANEOUS | 0 refills | Status: DC

## 2023-12-25 NOTE — Progress Notes (Signed)
 Daniel Powers, D.O.  ABFM, ABOM Specializing in Clinical Bariatric Medicine  Office located at: [address] [place], [place]  [address]   Assessment and Plan:   Medications Discontinued During This Encounter  Medication Reason   tirzepatide  (MOUNJARO ) 2.5 MG/0.5ML Pen Reorder     Meds ordered this encounter  Medications   tirzepatide  (MOUNJARO ) 2.5 MG/0.5ML Pen    Sig: Inject 2.5 mg into the skin once a week.    Dispense:  2 mL    Refill:  0    FOR THE DISEASE OF OBESITY:  BMI 40.0-44.9, adult (HCC) - current BMI 38.79 Morbid obesity (HCC)-start bmi 43.10/DATE 12/25/2018 Assessment & Plan: Since last office visit on 09/20/2023 patient's  Muscle mass has decreased by 2.6 lb. Fat mass has decreased by 0.2 lb. Total body water has increased by 0.2 lb.  Counseling done on how various foods will affect these numbers and how to maximize success  Total lbs lost to date: 31 lbs  Total weight loss percentage to date: 10.03%    Recommended Dietary Goals Daniel Powers is currently in the action stage of change. As such, his goal is to continue weight management plan.  He has agreed to: continue current plan   Behavioral Intervention We discussed the following today: increasing lean protein intake to established goals  Additional resources provided today: None  Evidence-based interventions for health behavior change were utilized today including the discussion of self monitoring techniques, problem-solving barriers and SMART goal setting techniques.   Regarding patient's less desirable eating habits and patterns, we employed the technique of small changes.   Pt will specifically work on: n/a   Recommended Physical Activity Goals Daniel Powers has been advised to work up to 300-450 minutes of moderate intensity aerobic activity a week and strengthening exercises 2-3 times per week for cardiovascular health, weight loss maintenance and preservation of muscle mass.   He has agreed to  INCREASE his walking to 30 minutes, 3-5 days a week.    Pharmacotherapy See T2DM note.   ASSOCIATED CONDITIONS ADDRESSED TODAY:  Type 2 diabetes mellitus with obesity [facility]) Assessment & Plan: Lab Results  Component Value Date   HGBA1C 5.8 11/21/2023   HGBA1C 5.9 (H) 07/24/2023   HGBA1C 5.7 01/09/2023   INSULIN  8.5 07/24/2023   INSULIN  9.6 04/12/2022   INSULIN  11.0 10/03/2021   Lab Results  Component Value Date   CREATININE 1.03 11/21/2023   BUN 21 11/21/2023   NA 138 11/21/2023   K 3.9 11/21/2023   CL 105 11/21/2023   CO2 26 11/21/2023      Component Value Date/Time   PROT 6.5 11/21/2023 0800   PROT 6.5 07/24/2023 0909   ALBUMIN 4.1 11/21/2023 0800   ALBUMIN 4.0 07/24/2023 0909   AST 11 11/21/2023 0800   ALT 11 11/21/2023 0800   ALKPHOS 39 11/21/2023 0800   BILITOT 0.8 11/21/2023 0800   BILITOT 0.9 07/24/2023 0909   BILIDIR 0.1 11/26/2014 1049   Lab Results  Component Value Date   WBC 5.8 11/21/2023   HGB 14.1 11/21/2023   HCT 43.1 11/21/2023   MCV 87.6 11/21/2023   PLT 183.0 11/21/2023   HgbA1c is at goal for age and comorbid conditions. Denies symptoms of hypoglycemia or hyperglycemia. Kidney function, liver enzymes, and CBC WNL.    Pt started the Mounjaro  2.5 mg last week. He has not felt any difference on the medication yet. Denies GI issues. He has cut out most bad carbs, but sometimes has  cakes and cookies.   Pt desires to maintain at the current dose of Mounjaro . Reminded pt about the potential benefits/SE of this medication. Reminded him the more he eats off-plan, the more likely for him to have side effects of the drug. Continue balanced diet focusing on protein, fruits, and vegetables while limiting simple carbohydrates.    Hypertension associated with type 2 diabetes mellitus (HCC) Assessment & Plan: Last 3 blood pressure readings in our office are as follows: BP Readings from Last 3 Encounters:  12/25/23 126/79  11/19/23 128/80  09/20/23  132/76   The 10-year ASCVD risk score (Arnett DK, et al., 2019) is: 29%  Lab Results  Component Value Date   CREATININE 1.03 11/21/2023   Blood pressure controlled on Norvasc  5 mg daily and Benicar  40 mg daily. Continue regimen and low sodium diet. He will increase his walking regimen.     Hyperlipidemia associated with type 2 diabetes mellitus (HCC)- low HDL Assessment & Plan: Lab Results  Component Value Date   CHOL 131 11/21/2023   HDL 44.80 11/21/2023   LDLCALC 74 11/21/2023   TRIG 58.0 11/21/2023   CHOLHDL 3 11/21/2023   The 10-year ASCVD risk score (Arnett DK, et al., 2019) is: 29%   Values used to calculate the score:     Age: 68 years     Clincally relevant sex: Male     Is Non-Hispanic African American: Yes     Diabetic: Yes     Tobacco smoker: No     Systolic Blood Pressure: 126 mmHg     Is BP treated: Yes     HDL Cholesterol: 44.8 mg/dL     Total Cholesterol: 131 mg/dL  Relevant medications: Eliquis  5 mg twice daily and Atorvastatin  10 mg daily.   Most recent lipid panel was within normal limits. Continue medications and  and working on nutrition plan -decreasing simple carbohydrates, increasing lean proteins, decreasing saturated fats and cholesterol , avoiding trans fats and exercise as able to promote weight loss, improve lipids and decrease cardiovascular risks.    Follow up:   Return 02/05/2024 at 9:20 AM. He was informed of the importance of frequent follow up visits to maximize his success with intensive lifestyle modifications for his multiple health conditions.  Subjective:   Chief complaint: Obesity Daniel Powers is here to discuss his progress with his obesity treatment plan. He is on the Category 1 Plan with B/L options and states he is following his eating plan approximately 50% of the time. He states he is walking 30 minutes 2 days per week.  Interval History:  Daniel Powers is here for a follow up office visit. Since last OV on 09/20/2023, Daniel Powers is  down 3 lbs. Pt started the Mounjaro  2.5 mg last week. He has not felt any difference on the medication yet. Denies GI issues. He has cut out most bad carbs, but sometimes has cakes and cookies. He acknowledges being under in lean protein intake.    Pharmacotherapy for weight loss: He is currently taking Mounjaro  2.5 mg wkly   Review of Systems:  Pertinent positives were addressed with patient today.  Reviewed by clinician on day of visit: allergies, medications, problem list, medical history, surgical history, family history, social history, and previous encounter notes.  Weight Summary and Biometrics   Weight Lost Since Last Visit: 3lb  Weight Gained Since Last Visit: 0   Vitals Temp: 98.1 F (36.7 C) BP: 126/79 Pulse Rate: 76 SpO2: 98 %   Anthropometric  Measurements Height: 5' 11 (1.803 m) Weight: 278 lb (126.1 kg) BMI (Calculated): 38.79 Weight at Last Visit: 281lb Weight Lost Since Last Visit: 3lb Weight Gained Since Last Visit: 0 Starting Weight: 309lb Total Weight Loss (lbs): 31 lb (14.1 kg)   Body Composition  Body Fat %: 35.6 % Fat Mass (lbs): 99 lbs Muscle Mass (lbs): 170.4 lbs Total Body Water (lbs): 130 lbs Visceral Fat Rating : 24   Other Clinical Data Fasting: yes Labs: no Today's Visit #: 56 Starting Date: 12/25/18   Objective:   PHYSICAL EXAM: Blood pressure 126/79, pulse 76, temperature 98.1 F (36.7 C), height 5' 11 (1.803 m), weight 278 lb (126.1 kg), SpO2 98%. Body mass index is 38.77 kg/m.  General: he is overweight, cooperative and in no acute distress. PSYCH: Has normal mood, affect and thought process.   HEENT: EOMI, sclerae are anicteric. Lungs: Normal breathing effort, no conversational dyspnea. Extremities: Moves * 4 Neurologic: A and O * 3, good insight  DIAGNOSTIC DATA REVIEWED: BMET    Component Value Date/Time   NA 138 11/21/2023 0800   NA 140 07/24/2023 0909   K 3.9 11/21/2023 0800   CL 105 11/21/2023 0800    CO2 26 11/21/2023 0800   GLUCOSE 98 11/21/2023 0800   GLUCOSE 85 05/07/2006 1230   BUN 21 11/21/2023 0800   BUN 17 07/24/2023 0909   CREATININE 1.03 11/21/2023 0800   CREATININE 0.85 08/01/2013 1250   CALCIUM  9.2 11/21/2023 0800   GFRNONAA 90 05/31/2020 0912   GFRAA 104 05/31/2020 0912   Lab Results  Component Value Date   HGBA1C 5.8 11/21/2023   HGBA1C 6.2 02/01/2018   Lab Results  Component Value Date   INSULIN  8.5 07/24/2023   INSULIN  9.5 12/25/2018   Lab Results  Component Value Date   TSH 0.85 01/09/2023   CBC    Component Value Date/Time   WBC 5.8 11/21/2023 0800   RBC 4.92 11/21/2023 0800   HGB 14.1 11/21/2023 0800   HGB 14.5 10/03/2021 0749   HGB 12.7 (L) 10/10/2010 0935   HCT 43.1 11/21/2023 0800   HCT 43.8 10/03/2021 0749   HCT 39.2 10/10/2010 0935   PLT 183.0 11/21/2023 0800   PLT 191 10/03/2021 0749   MCV 87.6 11/21/2023 0800   MCV 86 10/03/2021 0749   MCV 77 (L) 10/10/2010 0935   MCH 28.4 10/03/2021 0749   MCH 27.9 09/09/2016 1436   MCH 24.9 (L) 10/10/2010 0935   MCH 26.8 02/28/2010 2240   MCHC 32.7 11/21/2023 0800   RDW 14.3 11/21/2023 0800   RDW 13.5 10/03/2021 0749   RDW 16.3 (H) 10/10/2010 0935   Iron Studies No results found for: IRON, TIBC, FERRITIN, IRONPCTSAT Lipid Panel     Component Value Date/Time   CHOL 131 11/21/2023 0800   CHOL 128 04/12/2022 0952   TRIG 58.0 11/21/2023 0800   HDL 44.80 11/21/2023 0800   HDL 45 04/12/2022 0952   CHOLHDL 3 11/21/2023 0800   VLDL 11.6 11/21/2023 0800   LDLCALC 74 11/21/2023 0800   LDLCALC 70 04/12/2022 0952   Hepatic Function Panel     Component Value Date/Time   PROT 6.5 11/21/2023 0800   PROT 6.5 07/24/2023 0909   ALBUMIN 4.1 11/21/2023 0800   ALBUMIN 4.0 07/24/2023 0909   AST 11 11/21/2023 0800   ALT 11 11/21/2023 0800   ALKPHOS 39 11/21/2023 0800   BILITOT 0.8 11/21/2023 0800   BILITOT 0.9 07/24/2023 0909   BILIDIR 0.1 11/26/2014 1049  Component Value Date/Time    TSH 0.85 01/09/2023 1353   Nutritional Lab Results  Component Value Date   VD25OH 68.2 07/24/2023   VD25OH 71.25 01/09/2023   VD25OH 74.2 10/05/2022    Attestations:   I, Special Puri , acting as a Stage manager for [employer], DO., have compiled all relevant documentation for today's office visit on behalf of Marceil Sensor, DO, while in the presence of [employer], DO.  I have reviewed the above documentation for accuracy and completeness, and I agree with the above. Daniel Powers, D.O.  The 21st Century Cures Act was signed into law in 2016 which includes the topic of electronic health records.  This provides immediate access to information in MyChart.  This includes consultation notes, operative notes, office notes, lab results and pathology reports.  If you have any questions about what you read please let us  know at your next visit so we can discuss your concerns and take corrective action if need be.  We are right here with you.

## 2024-01-14 ENCOUNTER — Other Ambulatory Visit: Payer: Self-pay | Admitting: Family Medicine

## 2024-01-14 DIAGNOSIS — I10 Essential (primary) hypertension: Secondary | ICD-10-CM

## 2024-02-05 ENCOUNTER — Ambulatory Visit (INDEPENDENT_AMBULATORY_CARE_PROVIDER_SITE_OTHER): Admitting: Family Medicine

## 2024-02-06 ENCOUNTER — Other Ambulatory Visit (INDEPENDENT_AMBULATORY_CARE_PROVIDER_SITE_OTHER): Payer: Self-pay | Admitting: Family Medicine

## 2024-02-06 DIAGNOSIS — I152 Hypertension secondary to endocrine disorders: Secondary | ICD-10-CM

## 2024-02-06 DIAGNOSIS — E1169 Type 2 diabetes mellitus with other specified complication: Secondary | ICD-10-CM

## 2024-02-19 ENCOUNTER — Other Ambulatory Visit: Payer: Self-pay | Admitting: Family Medicine

## 2024-02-19 DIAGNOSIS — E785 Hyperlipidemia, unspecified: Secondary | ICD-10-CM

## 2024-02-28 ENCOUNTER — Encounter (INDEPENDENT_AMBULATORY_CARE_PROVIDER_SITE_OTHER): Payer: Self-pay | Admitting: Family Medicine

## 2024-02-28 ENCOUNTER — Ambulatory Visit (INDEPENDENT_AMBULATORY_CARE_PROVIDER_SITE_OTHER): Admitting: Family Medicine

## 2024-02-28 VITALS — BP 124/78 | HR 70 | Temp 98.1°F | Ht 71.0 in | Wt 277.0 lb

## 2024-02-28 DIAGNOSIS — E785 Hyperlipidemia, unspecified: Secondary | ICD-10-CM | POA: Diagnosis not present

## 2024-02-28 DIAGNOSIS — Z6838 Body mass index (BMI) 38.0-38.9, adult: Secondary | ICD-10-CM

## 2024-02-28 DIAGNOSIS — E1159 Type 2 diabetes mellitus with other circulatory complications: Secondary | ICD-10-CM | POA: Diagnosis not present

## 2024-02-28 DIAGNOSIS — Z7985 Long-term (current) use of injectable non-insulin antidiabetic drugs: Secondary | ICD-10-CM | POA: Diagnosis not present

## 2024-02-28 DIAGNOSIS — I152 Hypertension secondary to endocrine disorders: Secondary | ICD-10-CM | POA: Diagnosis not present

## 2024-02-28 DIAGNOSIS — E1169 Type 2 diabetes mellitus with other specified complication: Secondary | ICD-10-CM | POA: Diagnosis not present

## 2024-02-28 DIAGNOSIS — Z6841 Body Mass Index (BMI) 40.0 and over, adult: Secondary | ICD-10-CM

## 2024-02-28 MED ORDER — TIRZEPATIDE 2.5 MG/0.5ML ~~LOC~~ SOAJ: 2.5000 mg | SUBCUTANEOUS | 0 refills | Status: DC

## 2024-02-28 NOTE — Progress Notes (Signed)
 Daniel Powers, D.O.  ABFM, ABOM Specializing in Clinical Bariatric Medicine  Office located at: [address] [place], [place]  [address]   Assessment and Plan:   Medications Discontinued During This Encounter  Medication Reason   tirzepatide  (MOUNJARO ) 2.5 MG/0.5ML Pen Reorder    Meds ordered this encounter  Medications   tirzepatide  (MOUNJARO ) 2.5 MG/0.5ML Pen    Sig: Inject 2.5 mg into the skin once a week.    Dispense:  2 mL    Refill:  0      FOR THE DISEASE OF OBESITY:  BMI 40.0-44.9, adult (HCC) - current BMI 38.65 Morbid obesity (HCC)-start bmi 43.10/DATE 12/25/2018 Assessment & Plan: Since last office visit on 12/25/23 patient's muscle mass has decreased by 1.2 lbs. Fat mass has increased by 0.2 lbs. Total body water has increased by 0.2lbs. Counseling done on how various foods will affect these numbers and how to maximize success.  Total lbs lost to date: 32 lbs Total weight loss percentage to date: -10.36 %   Recommended Dietary Goals Daniel Powers is currently in the action stage of change. As such, his goal is to continue weight management plan.  He has agreed to: continue current plan   Behavioral Intervention We extensively discussed the following today: increasing lean protein intake to established goals, decreasing simple carbohydrates , increasing water intake , work on tracking and journaling calories using tracking application, reading food labels , decreasing eating out or consumption of processed foods, and making healthy choices when eating convenient foods, planning for success, and focusing on food with a 10:1 ratio of calories: grams of protein, healthier alternative options of foods when eating out, Stressed the importance of journaling to increase awareness and mindfulness.  Additional resources provided today: Eating out guide given and provided patient with personalized instruction on finding healthier options when eating out.  , Handout on  Daily Food Journaling Log, and Handout on Common Characteristics of Successful Weight Losers and Maintainers .Eating Out Guide Handout.  Evidence-based interventions for health behavior change were utilized today including the discussion of self monitoring techniques, problem-solving barriers and SMART goal setting techniques.   Regarding patient's less desirable eating habits and patterns, we employed the technique of small changes.   Pt will specifically work on Starting to journaling 2 days per week.    Recommended Physical Activity Goals Daniel Powers has been advised to work up to 300-450 minutes of moderate intensity aerobic activity a week and strengthening exercises 2-3 times per week for cardiovascular health, weight loss maintenance and preservation of muscle mass.   He has agreed to INCREASE his walking to 30 minutes, 3-5 days a week.    Pharmacotherapy We both agreed to: Continue with current nutritional and behavioral strategies continue with meds that aid in wt loss (Mounjaro )    ASSOCIATED CONDITIONS ADDRESSED TODAY:  Type 2 diabetes mellitus with obesity [facility]) Assessment & Plan: Lab Results  Component Value Date   HGBA1C 5.8 11/21/2023   HGBA1C 5.9 (H) 07/24/2023   HGBA1C 5.7 01/09/2023   INSULIN  8.5 07/24/2023   INSULIN  9.6 04/12/2022   INSULIN  11.0 10/03/2021    Reviewed last obtained labs: A1c improved from 5.9 to 5.8. Insulin  improved from 9.6 to 8.5. Started Mounjaro  2.5 mg once week about 1 week prior to LOV on 12/25/23. Pt ran out of supply about 1 month ago due to him cancelling his last scheduled appt. Endorses some constipation when initially starting Mounjaro . Was previously on Metformin, but does not  recall notable clinical benefit when still taking. Drinks about 16 ounces of water per day and coffee in the morning and unsweet tea (1-2 bottles).  Work on following nutritional meal plan by increasing protein intake, decreasing simple carbs and sugars, and  decreasing the frequency he is eating unhealthy fast foods. Focus on eating whole foods. Reminded pt of the importance of medication adherence. Mutually agreed for Daniel Powers to continue with Mounjaro  at current dose; will refill today. Risks/benefits reviewed with pt. Increase exercise to increase blood flow to the colon in an effort to prevent constipation. Properly hydrate by aiming to drink  at least 1/2 his body weight in ounces of water per day. Pt verbalized understanding/agreement to this plan.     Hyperlipidemia associated with type 2 diabetes mellitus (HCC)- low HDL Assessment & Plan: Lab Results  Component Value Date   CHOL 131 11/21/2023   HDL 44.80 11/21/2023   LDLCALC 74 11/21/2023   TRIG 58.0 11/21/2023   CHOLHDL 3 11/21/2023   The 10-year ASCVD risk score (Arnett DK, et al., 2019) is: 28.2%   Values used to calculate the score:     Age: 68 years     Clincally relevant sex: Male     Is Non-Hispanic African American: Yes     Diabetic: Yes     Tobacco smoker: No     Systolic Blood Pressure: 124 mmHg     Is BP treated: Yes     HDL Cholesterol: 44.8 mg/dL     Total Cholesterol: 131 mg/dL  Currently on Atorvastatin  10 mg once daily. Good compliance and tolerance reported today. No adverse side effects to statin therapy reported. BP at goal today. Visceral fat rating has been 24 for the past 7 months. ASCVD is 28.2%.   Visceral fat rating should be less than 10. Discussed how his current ASCVD score and visceral fat rating being above goal puts him at significant risk for possible MI and CAD; this is in addition to the risk his preexisting comorbidities pose. Recommend he continue statin therapy as prescribed, prioritize following a heart healthy diet, and regularly exercise. Will continue monitoring.     Follow up:   Return in about 4 weeks (around 03/27/2024) for 4 week f/u -- Schedule f/u on 03/26/24 at 7:20 AM; Ok'd by Dr. MALVA. Schedule additional 1 mo f./u after. He was  informed of the importance of frequent follow up visits to maximize his success with intensive lifestyle modifications for his multiple health conditions.  Subjective:   Chief complaint: Obesity Mayur is here to discuss his progress with his obesity treatment plan. He is on the Category 1 Plan w B/L options and states he is following his eating plan approximately 25% of the time. He states he is walking 30 minutes 1 day per week. States he is overall walking very little.   Interval History:  Daniel Powers is here for a follow up office visit. Since last OV on 02/28/24, he is down 1 lb. He reports increasing his mindfulness some by thinking about his calorie and protein intake. At times, he looks up the amount of calories and protein in the foods he is eating. He typically drinks 16 ounces of water daily. Other beverages include coffee in the morning and 2 bottles of unsweetened tea. He reports eating from fast food out of convenience. Admits to skipped meals during lunch and states when I am eating lunch, I eat [employer] or [employer]. For breakfast, he tends to eat  1 [employer] spicy McMuffin with egg. He states he has worked on decreasing his salt intake    Pharmacotherapy that aid with weight loss: He is currently taking Mounjaro  2.5 mg once weekly.    Review of Systems:  Pertinent positives were addressed with patient today.  Reviewed by clinician on day of visit: allergies, medications, problem list, medical history, surgical history, family history, social history, and previous encounter notes.  Weight Summary and Biometrics   Weight Lost Since Last Visit: 1lb  Weight Gained Since Last Visit: 0    Vitals Temp: 98.1 F (36.7 C) BP: 124/78 Pulse Rate: 70 SpO2: 98 %   Anthropometric Measurements Height: 5' 11 (1.803 m) Weight: 277 lb (125.6 kg) BMI (Calculated): 38.65 Weight at Last Visit: 278lb Weight Lost Since Last Visit: 1lb Weight Gained Since Last Visit:  0 Starting Weight: 309lb Total Weight Loss (lbs): 32 lb (14.5 kg)   Body Composition  Body Fat %: 35.8 % Fat Mass (lbs): 99.2 lbs Muscle Mass (lbs): 169.2 lbs Total Body Water (lbs): 130.2 lbs Visceral Fat Rating : 24   Other Clinical Data Fasting: yes Labs: No Today's Visit #: 57 Starting Date: 12/25/18    Objective:   PHYSICAL EXAM: Blood pressure 124/78, pulse 70, temperature 98.1 F (36.7 C), height 5' 11 (1.803 m), weight 277 lb (125.6 kg), SpO2 98%. Body mass index is 38.63 kg/m.  General: he is overweight, cooperative and in no acute distress. PSYCH: Has normal mood, affect and thought process.   HEENT: EOMI, sclerae are anicteric. Lungs: Normal breathing effort, no conversational dyspnea. Extremities: Moves * 4 Neurologic: A and O * 3, good insight  DIAGNOSTIC DATA REVIEWED: BMET    Component Value Date/Time   NA 138 11/21/2023 0800   NA 140 07/24/2023 0909   K 3.9 11/21/2023 0800   CL 105 11/21/2023 0800   CO2 26 11/21/2023 0800   GLUCOSE 98 11/21/2023 0800   GLUCOSE 85 05/07/2006 1230   BUN 21 11/21/2023 0800   BUN 17 07/24/2023 0909   CREATININE 1.03 11/21/2023 0800   CREATININE 0.85 08/01/2013 1250   CALCIUM  9.2 11/21/2023 0800   GFRNONAA 90 05/31/2020 0912   GFRAA 104 05/31/2020 0912   Lab Results  Component Value Date   HGBA1C 5.8 11/21/2023   HGBA1C 6.2 02/01/2018   Lab Results  Component Value Date   INSULIN  8.5 07/24/2023   INSULIN  9.5 12/25/2018   Lab Results  Component Value Date   TSH 0.85 01/09/2023   CBC    Component Value Date/Time   WBC 5.8 11/21/2023 0800   RBC 4.92 11/21/2023 0800   HGB 14.1 11/21/2023 0800   HGB 14.5 10/03/2021 0749   HGB 12.7 (L) 10/10/2010 0935   HCT 43.1 11/21/2023 0800   HCT 43.8 10/03/2021 0749   HCT 39.2 10/10/2010 0935   PLT 183.0 11/21/2023 0800   PLT 191 10/03/2021 0749   MCV 87.6 11/21/2023 0800   MCV 86 10/03/2021 0749   MCV 77 (L) 10/10/2010 0935   MCH 28.4 10/03/2021 0749    MCH 27.9 09/09/2016 1436   MCH 24.9 (L) 10/10/2010 0935   MCH 26.8 02/28/2010 2240   MCHC 32.7 11/21/2023 0800   RDW 14.3 11/21/2023 0800   RDW 13.5 10/03/2021 0749   RDW 16.3 (H) 10/10/2010 0935   Iron Studies No results found for: IRON, TIBC, FERRITIN, IRONPCTSAT Lipid Panel     Component Value Date/Time   CHOL 131 11/21/2023 0800   CHOL 128 04/12/2022 0952  TRIG 58.0 11/21/2023 0800   HDL 44.80 11/21/2023 0800   HDL 45 04/12/2022 0952   CHOLHDL 3 11/21/2023 0800   VLDL 11.6 11/21/2023 0800   LDLCALC 74 11/21/2023 0800   LDLCALC 70 04/12/2022 0952   Hepatic Function Panel     Component Value Date/Time   PROT 6.5 11/21/2023 0800   PROT 6.5 07/24/2023 0909   ALBUMIN 4.1 11/21/2023 0800   ALBUMIN 4.0 07/24/2023 0909   AST 11 11/21/2023 0800   ALT 11 11/21/2023 0800   ALKPHOS 39 11/21/2023 0800   BILITOT 0.8 11/21/2023 0800   BILITOT 0.9 07/24/2023 0909   BILIDIR 0.1 11/26/2014 1049      Component Value Date/Time   TSH 0.85 01/09/2023 1353   Nutritional Lab Results  Component Value Date   VD25OH 68.2 07/24/2023   VD25OH 71.25 01/09/2023   VD25OH 74.2 10/05/2022    Attestations:   I, Vernell Forest, acting as a Stage manager for Daniel Jenkins, DO., have compiled all relevant documentation for today's office visit on behalf of Daniel Jenkins, DO, while in the presence of [employer], DO.  Reviewed by clinician on day of visit: allergies, medications, problem list, medical history, surgical history, family history, social history, and previous encounter notes pertinent to patient's obesity diagnosis.  I have spent 52 minutes in the care of the patient today including 42 minutes face-to-face assessing and reviewing listed medical problems above as outlined in office visit note and providing nutritional and behavioral counseling as outlined in obesity care plan.   I have reviewed the above documentation for accuracy and completeness, and I agree with  the above. Daniel Powers, D.O.  The 21st Century Cures Act was signed into law in 2016 which includes the topic of electronic health records.  This provides immediate access to information in MyChart.  This includes consultation notes, operative notes, office notes, lab results and pathology reports.  If you have any questions about what you read please let us  know at your next visit so we can discuss your concerns and take corrective action if need be.  We are right here with you.

## 2024-03-14 ENCOUNTER — Other Ambulatory Visit: Payer: Self-pay | Admitting: Family Medicine

## 2024-03-14 DIAGNOSIS — I10 Essential (primary) hypertension: Secondary | ICD-10-CM

## 2024-03-26 ENCOUNTER — Encounter (INDEPENDENT_AMBULATORY_CARE_PROVIDER_SITE_OTHER): Payer: Self-pay | Admitting: Family Medicine

## 2024-03-26 ENCOUNTER — Ambulatory Visit (INDEPENDENT_AMBULATORY_CARE_PROVIDER_SITE_OTHER): Admitting: Family Medicine

## 2024-03-26 VITALS — BP 127/74 | HR 71 | Temp 98.2°F | Ht 71.0 in | Wt 272.0 lb

## 2024-03-26 DIAGNOSIS — E1159 Type 2 diabetes mellitus with other circulatory complications: Secondary | ICD-10-CM

## 2024-03-26 DIAGNOSIS — I152 Hypertension secondary to endocrine disorders: Secondary | ICD-10-CM

## 2024-03-26 DIAGNOSIS — E538 Deficiency of other specified B group vitamins: Secondary | ICD-10-CM

## 2024-03-26 DIAGNOSIS — E1169 Type 2 diabetes mellitus with other specified complication: Secondary | ICD-10-CM | POA: Diagnosis not present

## 2024-03-26 DIAGNOSIS — Z7985 Long-term (current) use of injectable non-insulin antidiabetic drugs: Secondary | ICD-10-CM | POA: Diagnosis not present

## 2024-03-26 DIAGNOSIS — E669 Obesity, unspecified: Secondary | ICD-10-CM | POA: Diagnosis not present

## 2024-03-26 DIAGNOSIS — Z6841 Body Mass Index (BMI) 40.0 and over, adult: Secondary | ICD-10-CM

## 2024-03-26 DIAGNOSIS — E559 Vitamin D deficiency, unspecified: Secondary | ICD-10-CM | POA: Diagnosis not present

## 2024-03-26 DIAGNOSIS — Z6837 Body mass index (BMI) 37.0-37.9, adult: Secondary | ICD-10-CM | POA: Diagnosis not present

## 2024-03-26 MED ORDER — VITAMIN D3 50 MCG (2000 UT) PO CAPS: ORAL_CAPSULE | ORAL | Status: DC

## 2024-03-26 MED ORDER — TIRZEPATIDE 5 MG/0.5ML ~~LOC~~ SOAJ: 5.0000 mg | SUBCUTANEOUS | 1 refills | Status: DC

## 2024-03-26 MED ORDER — CYANOCOBALAMIN 500 MCG PO TABS: 500.0000 ug | ORAL_TABLET | ORAL | Status: DC

## 2024-03-26 NOTE — Progress Notes (Signed)
 Daniel Powers, D.O.  ABFM, ABOM Specializing in Clinical Bariatric Medicine  Office located at: [address] [place], [place]  [address]   Assessment and Plan:   Orders Placed This Encounter  Procedures   VITAMIN D  25 Hydroxy (Vit-D Deficiency, Fractures)   Vitamin B12   Hemoglobin A1c   Medications Discontinued During This Encounter  Medication Reason   tirzepatide  (MOUNJARO ) 2.5 MG/0.5ML Pen Dose change   Cholecalciferol (VITAMIN D3) 50 MCG (2000 UT) CAPS Reorder   cyanocobalamin  (VITAMIN B12) 500 MCG tablet Reorder    Meds ordered this encounter  Medications   cyanocobalamin  (VITAMIN B12) 500 MCG tablet    Sig: Take 1 tablet (500 mcg total) by mouth every other day.   Cholecalciferol (VITAMIN D3) 50 MCG (2000 UT) capsule    Sig: Pt taking 2000 3 days/ wk   tirzepatide  (MOUNJARO ) 5 MG/0.5ML Pen    Sig: Inject 5 mg into the skin once a week.    Dispense:  2 mL    Refill:  1    FOR THE DISEASE OF OBESITY:  Morbid obesity (HCC)-start bmi 43.10/DATE 12/25/2018; BMI 40.0-44.9, adult (HCC) - current BMI 37.95 Assessment & Plan: Since last office visit on 02/28/2024 patient's muscle mass has decreased by 0.2 lbs. Fat mass has decreased by 4 lbs. Total body water has increased by 0.4 lbs.  Body fat % has decreased by 0.9 %. Counseling done on how various foods will affect these numbers and how to maximize success  Total lbs lost to date: 37 lbs Total weight loss percentage to date: 11.97%  Recommended Dietary Goals Brave is currently in the action stage of change. As such, his goal is to continue weight management plan.  He has agreed to: portion control, balanced plate and making smarter food choices, such as increasing vegetables, protein intake and reducing simple carbohydrates and processed foods  and incorporate time restricted eating 16 hours fasting with an 8 hour eating window while maintaining reduced calorie nutrition plan  Behavioral Intervention We  discussed the following today: increasing lean protein intake to established goals, decreasing simple carbohydrates , increasing vegetables, avoiding skipping meals, decreasing eating out or consumption of processed foods, and making healthy choices when eating convenient foods, work on managing stress, creating time for self-care and relaxation, continue to practice mindfulness when eating, and focusing on food with a 10:1 ratio of calories: grams of protein  Additional resources provided today: None  Evidence-based interventions for health behavior change were utilized today including the discussion of self monitoring techniques, problem-solving barriers and SMART goal setting techniques.   Regarding patient's less desirable eating habits and patterns, we employed the technique of small changes.   Goal(s) for next OV: eat higher protein snack or smaller meal every 3-5 hours within an 8 hour window   Recommended Physical Activity Goals Wesson has been advised to work up to 300-450 minutes of moderate intensity aerobic activity a week and strengthening exercises 2-3 times per week for cardiovascular health, weight loss maintenance and preservation of muscle mass.   He has agreed to: Think about enjoyable ways to increase physical activity to 30 minutes of walking 3x/ week and overcoming barriers to exercise.  Pharmacotherapy We both agreed to: Increase Mounjaro  to 5 mg weekly.  ASSOCIATED CONDITIONS ADDRESSED TODAY:  Type 2 diabetes mellitus with obesity (HCC) Assessment & Plan: Began Mounjaro  12/25/2023, and is currently taking Mounjaro  2.5 mg weekly. He endorses feeling fuller and less nighttime snacking since starting this  medication and denies experiencing any hypo/hyperglycemia lately. Is experiencing mild constipation as a side effect. Reviewed his most recent A1c in May 2025, which had improved 0.1 points from January, and fasting insulin  has also improved 1.1 points in January from 9.6 in  2023.  We extensively discussed the importance of decreasing simple carbs and increasing proteins, as well as eating on a regular basis- no skipping or going long periods without eating to optimize fat loss, instead try to eat smaller meals more often (every 3-5 hours recommended).  Increasing Mounjaro  from 2.5 mg to 5 mg weekly, filling today. To manage his associated constipation I encouraged him to increase his fiber and water intake, activity, as well as use Miralax as needed. Ordering HgbA1c today, will inform patient of results at next OV.  Lab Results  Component Value Date   HGBA1C 5.8 11/21/2023   HGBA1C 5.9 (H) 07/24/2023   HGBA1C 5.7 01/09/2023   INSULIN  8.5 07/24/2023   INSULIN  9.6 04/12/2022   INSULIN  11.0 10/03/2021      Hypertension associated with Type 2 Diabetes Mellitus (HCC) Medications: Amlodipine  5 mg daily and Olmesartan  40 mg daily. Reviewed with patient: today's blood pressure, which was within normal limits, as well as his Creatinine, which is at goal of <1.3.  Discussed eating fruits, veggies, and more lean proteins, as well as avoiding processed foods. Advised to continue taking medications as prescribed for adequate BP control. BP Readings from Last 3 Encounters:  03/26/24 127/74  02/28/24 124/78  12/25/23 126/79   Lab Results  Component Value Date   CREATININE 1.03 11/21/2023     Vitamin D  Deficiency Managed on Vitamin-D 2000 units 3x/ week on Mon, Tues, & Friday. Reviewed with patient: Vitamin-D, which is still within goal of 50-70, but from June 2024 to January 2025 has decreased slightly by 3.05 points. Ordering Vitamin-D today, will inform patient of results at next OV. Refilling Vitamin-D today. Advised to continue taking supplementation for adequate Vitamin-D levels. Lab Results  Component Value Date   VD25OH 68.2 07/24/2023   VD25OH 71.25 01/09/2023        Vitamin-B12 Deficiency Managed on Vitamin-B12 500 mcg PO 4x/ week. Reviewed with  patient: Vitamin-B12, which is elevated above goal of 232 - 1245, from 802 in June 2024 to >2000 January 2025. Ordering Vitamin B12 today, will inform patient of results at next OV - results to determine whether a change in supplementation is required. Refilled Vitamin-B12 today.  Lab Results  Component Value Date   VITAMINB12 >2000 (H) 07/24/2023   [id number] 802 01/09/2023   Follow up:   Return in 4 weeks (on 04/23/2024). He was informed of the importance of frequent follow up visits to maximize his success with intensive lifestyle modifications for his multiple health conditions.  [name] JONELLE Hartigan is aware that we will review all of his lab results at our next visit together in person.  He is aware that if anything is critical/ life threatening with the results, we will be contacting him via MyChart or by my CMA will be calling them prior to the office visit to discuss acute management.    Subjective:    Chief complaint: Obesity Orvill is here to discuss his progress with his obesity treatment plan. He is on the Category 1 Plan w B/L options and states he is following his eating plan approximately 50% of the time. Pt is walking 45 minutes 2 days per week and denies having any pain after doing activity.  Interval History:  [name] JONELLE Hartigan is here for a follow up office visit. Pt has experienced a weight loss of 5 LBS since last OV on 02/28/2024.  He admits that he is not counting his calories, but is staying mindful of what he's eating by staying away from fatty foods and carbohydrates, choosing healthy options (I.e grilled > fried). Protein options he consistently eats are chicken and fish, lately not so much fish. Unfortunately, he says he is only eating 2 meals a day at most with his 1st meal around 9:00 AM, with a tendency to skip lunch, and then eats dinner. He is also still eating out still at the same restaurants he usually does, but is choosing the healthier options whenever possible.     Pharmacotherapy that aid with weight loss: He is currently taking Monjauro with diabetes as the primary indication and obesity secondary with adequate clinical response and experiencing the following side effects: constipation.   Review of Systems:  Pertinent positives were addressed with patient today.  Reviewed by clinician on day of visit: allergies, medications, problem list, medical history, surgical history, family history, social history, and previous encounter notes.  Weight Summary and Biometrics   Weight Lost Since Last Visit: 5lb  Weight Gained Since Last Visit: 0   Vitals Temp: 98.2 F (36.8 C) BP: 127/74 Pulse Rate: 71 SpO2: 97 %   Anthropometric Measurements Height: 5' 11 (1.803 m) Weight: 272 lb (123.4 kg) BMI (Calculated): 37.95 Weight at Last Visit: 277lb Weight Lost Since Last Visit: 5lb Weight Gained Since Last Visit: 0 Starting Weight: 309lb Total Weight Loss (lbs): 37 lb (16.8 kg)   Body Composition  Body Fat %: 34.9 % Fat Mass (lbs): 95.2 lbs Muscle Mass (lbs): 169 lbs Total Body Water (lbs): 130.6 lbs Visceral Fat Rating : 23   Other Clinical Data Fasting: yes Labs: no Today's Visit #: 58 Starting Date: 12/25/18   Objective:   PHYSICAL EXAM: Blood pressure 127/74, pulse 71, temperature 98.2 F (36.8 C), height 5' 11 (1.803 m), weight 272 lb (123.4 kg), SpO2 97%. Body mass index is 37.94 kg/m.  General: he is overweight, cooperative and in no acute distress. PSYCH: Has normal mood, affect and thought process.   HEENT: EOMI, sclerae are anicteric. Lungs: Normal breathing effort, no conversational dyspnea. Extremities: Moves * 4 Neurologic: A and O * 3, good insight  DIAGNOSTIC DATA REVIEWED: BMET    Component Value Date/Time   NA 138 11/21/2023 0800   NA 140 07/24/2023 0909   K 3.9 11/21/2023 0800   CL 105 11/21/2023 0800   CO2 26 11/21/2023 0800   GLUCOSE 98 11/21/2023 0800   GLUCOSE 85 05/07/2006 1230   BUN 21  11/21/2023 0800   BUN 17 07/24/2023 0909   CREATININE 1.03 11/21/2023 0800   CREATININE 0.85 08/01/2013 1250   CALCIUM  9.2 11/21/2023 0800   GFRNONAA 90 05/31/2020 0912   GFRAA 104 05/31/2020 0912   Lab Results  Component Value Date   HGBA1C 5.8 11/21/2023   HGBA1C 6.2 02/01/2018   Lab Results  Component Value Date   INSULIN  8.5 07/24/2023   INSULIN  9.5 12/25/2018   Lab Results  Component Value Date   TSH 0.85 01/09/2023   CBC    Component Value Date/Time   WBC 5.8 11/21/2023 0800   RBC 4.92 11/21/2023 0800   HGB 14.1 11/21/2023 0800   HGB 14.5 10/03/2021 0749   HGB 12.7 (L) 10/10/2010 0935   HCT 43.1 11/21/2023 0800   HCT 43.8 10/03/2021  0749   HCT 39.2 10/10/2010 0935   PLT 183.0 11/21/2023 0800   PLT 191 10/03/2021 0749   MCV 87.6 11/21/2023 0800   MCV 86 10/03/2021 0749   MCV 77 (L) 10/10/2010 0935   MCH 28.4 10/03/2021 0749   MCH 27.9 09/09/2016 1436   MCH 24.9 (L) 10/10/2010 0935   MCH 26.8 02/28/2010 2240   MCHC 32.7 11/21/2023 0800   RDW 14.3 11/21/2023 0800   RDW 13.5 10/03/2021 0749   RDW 16.3 (H) 10/10/2010 0935   Iron Studies No results found for: IRON, TIBC, FERRITIN, IRONPCTSAT Lipid Panel     Component Value Date/Time   CHOL 131 11/21/2023 0800   CHOL 128 04/12/2022 0952   TRIG 58.0 11/21/2023 0800   HDL 44.80 11/21/2023 0800   HDL 45 04/12/2022 0952   CHOLHDL 3 11/21/2023 0800   VLDL 11.6 11/21/2023 0800   LDLCALC 74 11/21/2023 0800   LDLCALC 70 04/12/2022 0952   Hepatic Function Panel     Component Value Date/Time   PROT 6.5 11/21/2023 0800   PROT 6.5 07/24/2023 0909   ALBUMIN 4.1 11/21/2023 0800   ALBUMIN 4.0 07/24/2023 0909   AST 11 11/21/2023 0800   ALT 11 11/21/2023 0800   ALKPHOS 39 11/21/2023 0800   BILITOT 0.8 11/21/2023 0800   BILITOT 0.9 07/24/2023 0909   BILIDIR 0.1 11/26/2014 1049      Component Value Date/Time   TSH 0.85 01/09/2023 1353   Nutritional Lab Results  Component Value Date   VD25OH 68.2  07/24/2023   VD25OH 71.25 01/09/2023   VD25OH 74.2 10/05/2022    Attestations:   I, Damien Blanks, acting as a Stage manager for Daniel Jenkins, DO., have compiled all relevant documentation for today's office visit on behalf of Daniel Jenkins, DO, while in the presence of [employer], DO.  I have spent 22 minutes in the care of the patient today including 20 minutes face-to-face assessing and reviewing listed medical problems above as outlined in office visit note and providing nutritional and behavioral counseling as outlined in obesity care plan.   I have reviewed the above documentation for accuracy and completeness, and I agree with the above. Daniel JINNY Powers, D.O.  The 21st Century Cures Act was signed into law in 2016 which includes the topic of electronic health records.  This provides immediate access to information in MyChart.  This includes consultation notes, operative notes, office notes, lab results and pathology reports.  If you have any questions about what you read please let us  know at your next visit so we can discuss your concerns and take corrective action if need be.  We are right here with you.

## 2024-03-27 LAB — HEMOGLOBIN A1C
Est. average glucose Bld gHb Est-mCnc: 111 mg/dL
Hgb A1c MFr Bld: 5.5 % (ref 4.8–5.6)

## 2024-03-27 LAB — VITAMIN B12: Vitamin B-12: >2000 pg/mL — ABNORMAL HIGH (ref 232–1245)

## 2024-03-27 LAB — VITAMIN D 25 HYDROXY (VIT D DEFICIENCY, FRACTURES): Vit D, 25-Hydroxy: 58.3 ng/mL (ref 30.0–100.0)

## 2024-04-11 ENCOUNTER — Other Ambulatory Visit: Payer: Self-pay | Admitting: Family Medicine

## 2024-04-11 DIAGNOSIS — I10 Essential (primary) hypertension: Secondary | ICD-10-CM

## 2024-04-13 ENCOUNTER — Other Ambulatory Visit: Payer: Self-pay | Admitting: Family Medicine

## 2024-04-13 DIAGNOSIS — I82401 Acute embolism and thrombosis of unspecified deep veins of right lower extremity: Secondary | ICD-10-CM

## 2024-04-23 ENCOUNTER — Encounter (INDEPENDENT_AMBULATORY_CARE_PROVIDER_SITE_OTHER): Payer: Self-pay | Admitting: Family Medicine

## 2024-04-23 ENCOUNTER — Ambulatory Visit (INDEPENDENT_AMBULATORY_CARE_PROVIDER_SITE_OTHER): Admitting: Family Medicine

## 2024-04-23 VITALS — BP 136/75 | HR 88 | Temp 98.3°F | Ht 71.0 in | Wt 270.0 lb

## 2024-04-23 DIAGNOSIS — Z7985 Long-term (current) use of injectable non-insulin antidiabetic drugs: Secondary | ICD-10-CM

## 2024-04-23 DIAGNOSIS — E119 Type 2 diabetes mellitus without complications: Secondary | ICD-10-CM

## 2024-04-23 DIAGNOSIS — E1169 Type 2 diabetes mellitus with other specified complication: Secondary | ICD-10-CM | POA: Diagnosis not present

## 2024-04-23 DIAGNOSIS — E538 Deficiency of other specified B group vitamins: Secondary | ICD-10-CM | POA: Diagnosis not present

## 2024-04-23 DIAGNOSIS — E1159 Type 2 diabetes mellitus with other circulatory complications: Secondary | ICD-10-CM | POA: Diagnosis not present

## 2024-04-23 DIAGNOSIS — E559 Vitamin D deficiency, unspecified: Secondary | ICD-10-CM

## 2024-04-23 DIAGNOSIS — Z6837 Body mass index (BMI) 37.0-37.9, adult: Secondary | ICD-10-CM | POA: Diagnosis not present

## 2024-04-23 DIAGNOSIS — I152 Hypertension secondary to endocrine disorders: Secondary | ICD-10-CM | POA: Diagnosis not present

## 2024-04-23 DIAGNOSIS — Z6841 Body Mass Index (BMI) 40.0 and over, adult: Secondary | ICD-10-CM

## 2024-04-23 MED ORDER — TIRZEPATIDE 5 MG/0.5ML ~~LOC~~ SOAJ: 5.0000 mg | SUBCUTANEOUS | 1 refills | Status: DC

## 2024-04-23 MED ORDER — CYANOCOBALAMIN 500 MCG PO TABS: 500.0000 ug | ORAL_TABLET | ORAL | Status: DC

## 2024-04-23 MED ORDER — VITAMIN D3 50 MCG (2000 UT) PO CAPS: ORAL_CAPSULE | ORAL | Status: AC

## 2024-04-23 NOTE — Progress Notes (Signed)
 Daniel Powers, D.O.  ABFM, ABOM Specializing in Clinical Bariatric Medicine  Office located at: [address] [place], [place]  [address]    FOR THE CHRONIC DISEASE OF OBESITY:   Morbid obesity (HCC)-start bmi 43.10/DATE 12/25/2018; BMI 40.0-44.9, adult (HCC) - current BMI 37.67  Weight Summary and Body Composition Analysis  Weight Lost Since Last Visit: 2lb  Weight Gained Since Last Visit: 0lb    Vitals Temp: 98.3 F (36.8 C) BP: 136/75 Pulse Rate: 88 SpO2: 98 %   Anthropometric Measurements Height: 5' 11 (1.803 m) Weight: 270 lb (122.5 kg) BMI (Calculated): 37.67 Weight at Last Visit: 272lb Weight Lost Since Last Visit: 2lb Weight Gained Since Last Visit: 0lb Starting Weight: 309lb Total Weight Loss (lbs): 39 lb (17.7 kg)   Body Composition  Body Fat %: 34.1 % Fat Mass (lbs): 92.4 lbs Muscle Mass (lbs): 169.6 lbs Total Body Water (lbs): 125.2 lbs Visceral Fat Rating : 23   Other Clinical Data Fasting: yes Labs: no Today's Visit #: 53 Starting Date: 12/25/18    Chief complaint: Obesity  Interval History Daniel Powers is here for a follow-up office visit to discuss his progress with his obesity treatment plan. He is on the Category 2 Plan with B/L options and states he is following his eating plan approximately 50 % of the time. He is walking 30  minutes 2 days per week  He has experienced a weight loss of 2 lbs since last OV on 03/26/2024.   His dietary and life habits include:  - Tracking Calories/Macros: no, pt not on a journaling plan.  - Eating More Whole Foods: yes  - Adequate Protein Intake: yes  - Adequate Water Intake: no  - Skipping Meals: yes, sometimes he skips lunch because he's busy with work or he does not feel hungry.  - Sleeping 7-9 Hours/ Night: no, he has not gotten 7-9 hours of sleep in years.     03/26/24 06:00 04/23/24 07:00   Body Fat % 34.9 % 34.1 %  Muscle Mass (lbs) 169 lbs 169.6 lbs  Fat Mass (lbs)  95.2 lbs 92.4 lbs  Total Body Water (lbs) 130.6 lbs 125.2 lbs  Visceral Fat Rating  23 23   Counseling done on how various foods will affect these numbers and how to maximize success  Total lbs lost to date: -39 lbs Total Fat Mass lost to date: -16.6 lbs Total weight loss percentage to date: -12.62 %   Nutritional and Behavioral Counseling:  We discussed the following today: high protein cereals, increasing fiber rich foods, avoiding skipping meals, increasing water intake , eating multiple small meals a day to get in all their foods, and better snacking choices  Additional resources provided today: Handout on CAT 1 meal plan   Evidence-based interventions for health behavior change were utilized today including the discussion of self monitoring techniques, problem-solving barriers and SMART goal setting techniques.   Regarding patient's less desirable eating habits and patterns, we employed the technique of small changes.   SMART Goal(s) created today: eat multiple small meals every 3 hours.    Recommended Dietary Goals Daniel Powers is currently in the action stage of change. As such, his goal is to continue weight management plan.  He has agreed to switch to the CAT 1 MP because he is having a difficult time getting in all the CAT 2 MP foods.   Recommended Physical Activity Goals Daniel Powers has been advised to work up to 300-450 minutes of  moderate intensity aerobic activity a week and strengthening exercises 2-3 times per week for cardiovascular health, weight loss maintenance and preservation of muscle mass.   He was encouraged to Continue to gradually increase the amount and intensity of exercise routine   Medical Interventions and Pharmacotherapy Previous Bariatric surgery: none Pharmacotherapy for weight loss:  continue Mounjaro  at same dose.   OBESITY RELATED CONDITIONS ADDRESSED TODAY:    Medications Discontinued During This Encounter  Medication Reason   cyanocobalamin   (VITAMIN B12) 500 MCG tablet Reorder   Cholecalciferol (VITAMIN D3) 50 MCG (2000 UT) capsule Reorder   tirzepatide  (MOUNJARO ) 5 MG/0.5ML Pen Reorder     Meds ordered this encounter  Medications   Cholecalciferol (VITAMIN D3) 50 MCG (2000 UT) capsule    Sig: Pt taking 2000 3 days/ wk   DISCONTD: cyanocobalamin  (VITAMIN B12) 500 MCG tablet    Sig: Take 1 tablet (500 mcg total) by mouth every other day.   tirzepatide  (MOUNJARO ) 5 MG/0.5ML Pen    Sig: Inject 5 mg into the skin once a week.    Dispense:  2 mL    Refill:  1      Type 2 diabetes mellitus in patient with obesity [facility]) Assessment & Plan: Lab Results  Component Value Date   HGBA1C 5.5 03/26/2024   HGBA1C 5.8 11/21/2023   HGBA1C 5.9 (H) 07/24/2023   INSULIN  8.5 07/24/2023   INSULIN  9.6 04/12/2022   INSULIN  11.0 10/03/2021    Pt was 1st diagnosed with DM with his primary care sometime prior to 01/2018; he was not in our wt loss program at that time. Reviewed labs with patient. A1c under excellent control; improved from 5.8 to 5.5. Pt not checking blood sugars; denies symptoms of hypoglycemia or hyperglycemia. On Mounjaro  5 mg wkly. He experiences slight constipation and has been drinking more water and eating more fiber. Discussed adding Miralax if needed. Denies excessive hunger and cravings. Reminded pt to eat on a regular basis (protein with meal every 3-4 hours) - no skipping or going long periods without eating.  Reminded pt if  ever he feels poorly- check blood sugar and BP at that time. Cont Mounjaro  at current dose and balanced diet focusing on protein, fruits, and vegetables while limiting simple carbs.    Hypertension associated with type 2 diabetes mellitus (HCC) Assessment & Plan: Last 3 blood pressure readings in our office are as follows: BP Readings from Last 3 Encounters:  04/23/24 136/75  03/26/24 127/74  02/28/24 124/78   The 10-year ASCVD risk score (Arnett DK, et al., 2019) is: 32.7%  Lab Results   Component Value Date   CREATININE 1.03 11/21/2023   BP stable today. Pt asx. No acute concerns. Cont Amlodipine  5 mg daily and Olmesartan  40 mg. Cont low sodium heart healthy meal plan and increase exercise as able.     Vitamin D  deficiency Assessment & Plan: Lab Results  Component Value Date   VD25OH 58.3 03/26/2024   VD25OH 68.2 07/24/2023   VD25OH 71.25 01/09/2023   On OTC Vit D 2,000 units 3 days/week. Reviewed labs with pt. Vit D level is at goal of 50 to 70. Continue supplementation. Recheck in another 3-4 months.     B12 deficiency Assessment & Plan: Lab Results  Component Value Date   VITAMINB12 >2000 (H) 03/26/2024   About 9 months ago, we decreased his B12 to 500 mcg every other day. Reviewed with pt that levels are still elevated. Plan: D/C B12 supplementation. Continue prudent nutritional  plan. Recheck levels in another 3 months.     Follow up:   Return 05/27/2024 at 8:00 AM.  He was informed of the importance of frequent follow up visits to maximize his success with intensive lifestyle modifications for his multiple health conditions.   Objective:   PHYSICAL EXAM: Blood pressure 136/75, pulse 88, temperature 98.3 F (36.8 C), height 5' 11 (1.803 m), weight 270 lb (122.5 kg), SpO2 98%. Body mass index is 37.66 kg/m.  General: he is overweight, cooperative and in no acute distress. PSYCH: Has normal mood, affect and thought process.   HEENT: EOMI, sclerae are anicteric. Lungs: Normal breathing effort, no conversational dyspnea. Extremities: Moves * 4 Neurologic: A and O * 3, good insight  DIAGNOSTIC DATA REVIEWED: BMET    Component Value Date/Time   NA 138 11/21/2023 0800   NA 140 07/24/2023 0909   K 3.9 11/21/2023 0800   CL 105 11/21/2023 0800   CO2 26 11/21/2023 0800   GLUCOSE 98 11/21/2023 0800   GLUCOSE 85 05/07/2006 1230   BUN 21 11/21/2023 0800   BUN 17 07/24/2023 0909   CREATININE 1.03 11/21/2023 0800   CREATININE 0.85 08/01/2013  1250   CALCIUM  9.2 11/21/2023 0800   GFRNONAA 90 05/31/2020 0912   GFRAA 104 05/31/2020 0912   Lab Results  Component Value Date   HGBA1C 5.5 03/26/2024   HGBA1C 6.2 02/01/2018   Lab Results  Component Value Date   INSULIN  8.5 07/24/2023   INSULIN  9.5 12/25/2018   Lab Results  Component Value Date   TSH 0.85 01/09/2023   CBC    Component Value Date/Time   WBC 5.8 11/21/2023 0800   RBC 4.92 11/21/2023 0800   HGB 14.1 11/21/2023 0800   HGB 14.5 10/03/2021 0749   HGB 12.7 (L) 10/10/2010 0935   HCT 43.1 11/21/2023 0800   HCT 43.8 10/03/2021 0749   HCT 39.2 10/10/2010 0935   PLT 183.0 11/21/2023 0800   PLT 191 10/03/2021 0749   MCV 87.6 11/21/2023 0800   MCV 86 10/03/2021 0749   MCV 77 (L) 10/10/2010 0935   MCH 28.4 10/03/2021 0749   MCH 27.9 09/09/2016 1436   MCH 24.9 (L) 10/10/2010 0935   MCH 26.8 02/28/2010 2240   MCHC 32.7 11/21/2023 0800   RDW 14.3 11/21/2023 0800   RDW 13.5 10/03/2021 0749   RDW 16.3 (H) 10/10/2010 0935   Iron Studies No results found for: IRON, TIBC, FERRITIN, IRONPCTSAT Lipid Panel     Component Value Date/Time   CHOL 131 11/21/2023 0800   CHOL 128 04/12/2022 0952   TRIG 58.0 11/21/2023 0800   HDL 44.80 11/21/2023 0800   HDL 45 04/12/2022 0952   CHOLHDL 3 11/21/2023 0800   VLDL 11.6 11/21/2023 0800   LDLCALC 74 11/21/2023 0800   LDLCALC 70 04/12/2022 0952   Hepatic Function Panel     Component Value Date/Time   PROT 6.5 11/21/2023 0800   PROT 6.5 07/24/2023 0909   ALBUMIN 4.1 11/21/2023 0800   ALBUMIN 4.0 07/24/2023 0909   AST 11 11/21/2023 0800   ALT 11 11/21/2023 0800   ALKPHOS 39 11/21/2023 0800   BILITOT 0.8 11/21/2023 0800   BILITOT 0.9 07/24/2023 0909   BILIDIR 0.1 11/26/2014 1049      Component Value Date/Time   TSH 0.85 01/09/2023 1353   Nutritional Lab Results  Component Value Date   VD25OH 58.3 03/26/2024   VD25OH 68.2 07/24/2023   VD25OH 71.25 01/09/2023    Attestations:  I, Special Derek,  acting as a Stage manager for [employer], DO., have compiled all relevant documentation for today's office visit on behalf of Daniel Jenkins, DO, while in the presence of [employer], DO.  Pertinent positives were addressed with patient today. Reviewed by clinician on day of visit: allergies, medications, problem list, medical history, surgical history, family history, social history, and previous encounter notes.  I have spent 42 minutes in the care of the patient today including 35 minutes face-to-face assessing and reviewing listed medical problems above as outlined in office visit note, providing nutritional and behavioral counseling as outlined in obesity care plan, and spending additional time filling out insurance forms for diabetes and for Mounjaro  authorization.  I have reviewed the above documentation for accuracy and completeness, and I agree with the above. Daniel JINNY Powers, D.O.  The 21st Century Cures Act was signed into law in 2016 which includes the topic of electronic health records.  This provides immediate access to information in MyChart. This includes consultation notes, operative notes, office notes, lab results and pathology reports.  If you have any questions about what you read please let us  know at your next visit so we can discuss your concerns and take corrective action if need be.  We are right here with you.

## 2024-05-20 ENCOUNTER — Ambulatory Visit
Admission: RE | Admit: 2024-05-20 | Discharge: 2024-05-20 | Disposition: A | Discharge status: Home / Self Care | Source: Ambulatory Visit | Attending: Emergency Medicine | Admitting: Emergency Medicine

## 2024-05-20 VITALS — BP 147/89 | HR 71 | Temp 98.1°F | Resp 15

## 2024-05-20 DIAGNOSIS — M62830 Muscle spasm of back: Secondary | ICD-10-CM | POA: Diagnosis not present

## 2024-05-20 MED ORDER — METHYLPREDNISOLONE 4 MG PO TBPK: ORAL_TABLET | ORAL | 0 refills | Status: DC

## 2024-05-20 MED ORDER — ACETAMINOPHEN 500 MG PO TABS: 1000.0000 mg | ORAL_TABLET | Freq: Three times a day (TID) | ORAL | 0 refills | Status: AC | PRN

## 2024-05-20 MED ORDER — BACLOFEN 5 MG PO TABS: 5.0000 mg | ORAL_TABLET | Freq: Three times a day (TID) | ORAL | 0 refills | Status: AC

## 2024-05-20 NOTE — ED Triage Notes (Signed)
 Pt states for about 1.5 weeks he has had lower back pain and spasms. He states he has been straining due to constipation.

## 2024-05-20 NOTE — Discharge Instructions (Addendum)
 The mainstay of therapy for musculoskeletal pain is reduction of inflammation and relaxation of tension which is causing inflammation.  Keep in mind, pain always begets more pain.  To help you stay ahead of your pain and inflammation, I have provided the following regimen for you:   When you pick up your prescription from the pharmacy, please begin taking Tylenol  975 mg 3 times daily (every 8 hours).  Please know that It is safe to take a maximum 3000 mg of Tylenol  in a 24-hour period.  Please do not exceed this amount.  Tylenol  works best when taken on a scheduled basis.   When you pick up your prescription from the pharmacy, please begin taking baclofen  10 mg.  This is a highly effective muscle relaxer and antispasmodic which should continue to provide you with relaxation of your tense muscles, allow you to sleep well and to keep your pain under control.   When you pick up your prescription from the pharmacy, please begin taking methylprednisolone .  Please take 1 full row tablets at once with your breakfast meal.  This will continue to keep your inflammation under control until your body can heal.   During the day, please set aside time to apply ice to the affected area 4 times daily for 20 minutes each application.  This can be achieved by using a bag of frozen peas or corn, a Ziploc bag filled with ice and water, or Ziploc bag filled with half rubbing alcohol and half Dawn dish detergent, frozen into a slush.  Please be careful not to apply ice directly to your skin, always place a soft cloth between you and the ice pack.  Over-the-counter products such as IcyHot and Biofreeze do not work nearly as well.   Please avoid attempts to stretch or strengthen the affected area until you are feeling completely pain-free.  Attempts to do so will only prolong the healing process.   I also recommend that you remain out of work for the next several days, I provided you with a note to return to work in 3 days.   If you feel that you need this time extended, please follow-up with your primary care provider or return to urgent care for reevaluation so that we can provide you with a note for another 3 days.   Thank you for visiting [facility] today.  We appreciate the opportunity to participate in your care.

## 2024-05-20 NOTE — ED Provider Notes (Signed)
 Daniel Powers    CSN: [id number] Arrival date & time: 05/20/24  1355    HISTORY   Chief Complaint  Patient presents with   Back Pain    LOWER BACK PAIN - Entered by patient   HPI Daniel Powers is a pleasant, 68 y.o. male who presents to urgent care today. Patient complains of progressively worsening lower back pain for the past week and a half.  Patient states this has happened to him before, last episode was about a year ago.  Patient states that when he becomes very constipated, he will often sit and strain when trying to move his bowels and this ultimately causes him to have spasms in his back.  Denies numbness or tingling in the lower extremities, loss of range of motion, difficulty with ambulation, loss of bowel or bladder function, recent fall, known injury to lower back.  The history is provided by the patient.  Back Pain  Past Medical History:  Diagnosis Date   Allergy    Anxiety    Arthritis    Asthma    COPD (chronic obstructive pulmonary disease) (HCC)    Diabetes mellitus without complication (HCC)    DVT (deep venous thrombosis) (HCC)    Gout    High cholesterol    Hypertension    Influenza with pneumonia    Lactose intolerance    Obesity    Pre-diabetes    Psychosexual dysfunction with inhibited sexual excitement    Patient Active Problem List   Diagnosis Date Noted   BMI 40.0-44.9, adult (HCC)-current bmi 40.8 09/07/2022   B12 deficiency 04/12/2022   SOB (shortness of breath) on exertion 04/12/2022   Type 2 diabetes mellitus in patient with obesity (HCC) 04/12/2022   Essential hypertension 03/13/2022   Vitamin D  deficiency 03/28/2021   Diet-controlled diabetes mellitus (HCC) 08/05/2018   Hyperlipidemia associated with type 2 diabetes mellitus (HCC) 08/05/2018   Chest pain 02/01/2018   Mid back pain 02/01/2018   Diabetes mellitus (HCC) 02/01/2018   Need for influenza vaccination 05/08/2017   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) 08/18/2016   Preventative use of agents affecting estrogen receptors or levels 11/26/2014   Preventative health care 11/26/2014   Class 3 severe obesity with serious comorbidity and body mass index (BMI) of 40.0 to 44.9 in adult (HCC) 12/02/2013   Morbid obesity (HCC) 08/08/2013   Long term (current) use of anticoagulants 08/08/2013   Hyperlipidemia 10/22/2012   History of colonic polyps 01/02/2012   Musculoskeletal pain 01/02/2012   Anxiety 11/21/2011   OSA (obstructive sleep apnea) 11/21/2011   Special screening for malignant neoplasms, colon 09/20/2011   DVT, HX OF 09/12/2010   Acute thromboembolism of deep veins of lower extremity (HCC) 03/03/2010   GOUT 09/06/2009   COSTOCHONDRITIS 09/06/2009   Superficial foreign body of hand without major open wound 01/16/2008   LEG PAIN, RIGHT 01/14/2008   Pneumonia due to other specified bacteria (HCC) 10/09/2007   INFLUENZA WITH PNEUMONIA 10/09/2007   ERECTILE DYSFUNCTION 09/02/2006   Hypertension associated with type 2 diabetes mellitus (HCC) 09/02/2006   Allergic rhinitis 09/02/2006   Past Surgical History:  Procedure Laterality Date   HAND SURGERY     right   TONSILLECTOMY AND ADENOIDECTOMY      Home Medications    Prior to Admission medications   Medication Sig Start Date End Date Taking? Authorizing Provider  acetaminophen  (TYLENOL ) 500 MG tablet Take 2 tablets (1,000 mg total) by mouth  every 8 (eight) hours as needed for up to 30 doses for mild pain (pain score 1-3) or fever. 05/20/24  Yes Joesph Shaver Scales, PA-C  albuterol  (VENTOLIN  HFA) 108 (90 Base) MCG/ACT inhaler Inhale 1-2 puffs into the lungs every 6 (six) hours as needed for wheezing or shortness of breath. 08/22/23  Yes Enedelia Dorna HERO, FNP  amLODipine  (NORVASC ) 5 MG tablet TAKE 1 TABLET(5 MG) BY MOUTH DAILY 04/11/24  Yes Lowne Chase, Yvonne R, DO  atorvastatin  (LIPITOR) 10 MG tablet TAKE 1 TABLET(10 MG) BY MOUTH DAILY 02/19/24  Yes Lowne  Chase, Yvonne R, DO  azelastine  (ASTELIN ) 0.1 % nasal spray Place 1 spray into both nostrils 2 (two) times daily. Use in each nostril as directed Patient taking differently: Place 1 spray into both nostrils daily as needed. Use in each nostril as directed 11/19/23  Yes Lowne Chase, Yvonne R, DO  baclofen  5 MG TABS Take 1 tablet (5 mg total) by mouth 3 (three) times daily for 7 days. 05/20/24 05/27/24 Yes Joesph Shaver Scales, PA-C  cetirizine (ZYRTEC) 10 MG tablet Take 10 mg by mouth daily.   Yes [provider]  Cholecalciferol (VITAMIN D3) 50 MCG (2000 UT) capsule Pt taking 2000 3 days/ wk 04/23/24  Yes Opalski, Deborah, DO  CINNAMON PO Take 1 tablet by mouth daily.   Yes [provider]  ELIQUIS  5 MG TABS tablet TAKE 1 TABLET(5 MG) BY MOUTH TWICE DAILY 04/14/24  Yes Lowne Chase, Yvonne R, DO  fish oil-omega-3 fatty acids 1000 MG capsule Take 1 g by mouth daily.   Yes [provider]  Flaxseed, Linseed, 1000 MG CAPS Take 1 capsule by mouth daily.   Yes [provider]  fluticasone  (FLONASE ) 50 MCG/ACT nasal spray Place 2 sprays into both nostrils daily. Patient taking differently: Place 2 sprays into both nostrils daily as needed. 09/09/21  Yes Frann Mabel Mt, DO  LORazepam  (ATIVAN ) 0.5 MG tablet Take 1 tablet (0.5 mg total) by mouth every 8 (eight) hours. Patient taking differently: Take 0.5 mg by mouth daily as needed. 08/24/23  Yes Antonio Cyndee Rockers R, DO  methylPREDNISolone  (MEDROL  DOSEPAK) 4 MG TBPK tablet Take 24 mg on day 1, 20 mg on day 2, 16 mg on day 3, 12 mg on day 4, 8 mg on day 5, 4 mg on day 6.  Take all tablets in each row at once, do not spread tablets out throughout the day. 05/20/24  Yes Joesph Shaver Scales, PA-C  olmesartan  (BENICAR ) 40 MG tablet TAKE 1 TABLET(40 MG) BY MOUTH DAILY 03/14/24  Yes Lowne Chase, Yvonne R, DO  tadalafil (CIALIS) 20 MG tablet Take 20 mg by mouth daily as needed.   Yes [provider]  tirzepatide   (MOUNJARO ) 5 MG/0.5ML Pen Inject 5 mg into the skin once a week. 04/23/24  Yes Opalski, Barnie, DO    Family History Family History  Problem Relation Age of Onset   Hypertension Mother    Alzheimer's disease Mother    Arthritis Mother    Hypertension Father    Alcohol abuse Father    Obesity Father    Cancer Sister        male organs   Stroke Brother    Heart disease Brother 33       MI   Hypertension Maternal Grandmother    Alzheimer's disease Maternal Grandmother    Arthritis Maternal Grandmother    Hypertension Maternal Grandfather    Arthritis Maternal Grandfather    Hypertension Paternal  Grandmother    Obesity Paternal Grandmother    Hypertension Paternal Grandfather    Lung cancer Maternal Uncle    Esophageal cancer Maternal Uncle    Alzheimer's disease Paternal Aunt    Cancer Niece    Colon cancer Neg Hx    Social History Social History   Tobacco Use   Smoking status: Never    Passive exposure: Never   Smokeless tobacco: Never  Vaping Use   Vaping status: Never Used  Substance Use Topics   Alcohol use: Yes    Alcohol/week: 1.0 standard drink of alcohol    Types: 1 Cans of beer per week    Comment: I may drink one beer every three months   Drug use: No   Allergies   Penicillins  Review of Systems Review of Systems  Musculoskeletal:  Positive for back pain.   Pertinent findings revealed after performing a 14 point review of systems has been noted in the history of present illness.  Physical Exam Vital Signs BP (!) 147/89 (BP Location: Right Arm)   Pulse 71   Temp 98.1 F (36.7 C) (Oral)   Resp 15   SpO2 97%   No data found.  Physical Exam Vitals and nursing note reviewed.  Constitutional:      General: He is awake. He is not in acute distress.    Appearance: Normal appearance. He is well-developed and well-groomed. He is morbidly obese. He is not ill-appearing.  Musculoskeletal:     Lumbar back: Spasms and tenderness present.   Neurological:     Mental Status: He is alert.  Psychiatric:        Behavior: Behavior is cooperative.     Powers Couse / Diagnostics / Procedures:     Radiology No results found.  Procedures Procedures (including critical care time) EKG  Pending results:  Labs Reviewed - No data to display  Medications Ordered in Powers: Medications - No data to display  Powers Diagnoses / Final Clinical Impressions(s)   I have reviewed the triage vital signs and the nursing notes.  Pertinent labs & imaging results that were available during my care of the patient were reviewed by me and considered in my medical decision making (see chart for details).    Final diagnoses:  Spasm of lumbar paraspinous muscle   Patient was advised to: Begin Medrol  dose pack Take muscle relaxer 3 times daily (Patient has been advised that if this makes them sleepy, they can just take this at bedtime, up to 20 mg per dose, and try breaking the tablets in half or 5 mg per dose during the day) Begin acetaminophen  1000 mg 3 times daily on a scheduled basis. Apply ice pack to affected area 4 times daily for 20 minutes each time Avoid stretching or strengthening exercises until pain is completely resolved Return precautions advised  Please see discharge instructions below for details of plan of care as provided to patient. ED Prescriptions     Medication Sig Dispense Auth. Provider   methylPREDNISolone  (MEDROL  DOSEPAK) 4 MG TBPK tablet Take 24 mg on day 1, 20 mg on day 2, 16 mg on day 3, 12 mg on day 4, 8 mg on day 5, 4 mg on day 6.  Take all tablets in each row at once, do not spread tablets out throughout the day. 21 tablet Joesph Shaver Scales, PA-C   baclofen  5 MG TABS Take 1 tablet (5 mg total) by mouth 3 (three) times daily for 7 days. 21  tablet Joesph Shaver Scales, PA-C   acetaminophen  (TYLENOL ) 500 MG tablet Take 2 tablets (1,000 mg total) by mouth every 8 (eight) hours as needed for up to 30 doses for mild  pain (pain score 1-3) or fever. 60 tablet Joesph Shaver Scales, PA-C      PDMP not reviewed this encounter.    Discharge Instructions      The mainstay of therapy for musculoskeletal pain is reduction of inflammation and relaxation of tension which is causing inflammation.  Keep in mind, pain always begets more pain.  To help you stay ahead of your pain and inflammation, I have provided the following regimen for you:   When you pick up your prescription from the pharmacy, please begin taking Tylenol  975 mg 3 times daily (every 8 hours).  Please know that It is safe to take a maximum 3000 mg of Tylenol  in a 24-hour period.  Please do not exceed this amount.  Tylenol  works best when taken on a scheduled basis.   When you pick up your prescription from the pharmacy, please begin taking baclofen  10 mg.  This is a highly effective muscle relaxer and antispasmodic which should continue to provide you with relaxation of your tense muscles, allow you to sleep well and to keep your pain under control.   When you pick up your prescription from the pharmacy, please begin taking methylprednisolone .  Please take 1 full row tablets at once with your breakfast meal.  This will continue to keep your inflammation under control until your body can heal.   During the day, please set aside time to apply ice to the affected area 4 times daily for 20 minutes each application.  This can be achieved by using a bag of frozen peas or corn, a Ziploc bag filled with ice and water, or Ziploc bag filled with half rubbing alcohol and half Dawn dish detergent, frozen into a slush.  Please be careful not to apply ice directly to your skin, always place a soft cloth between you and the ice pack.  Over-the-counter products such as IcyHot and Biofreeze do not work nearly as well.   Please avoid attempts to stretch or strengthen the affected area until you are feeling completely pain-free.  Attempts to do so will only prolong the  healing process.   I also recommend that you remain out of work for the next several days, I provided you with a note to return to work in 3 days.  If you feel that you need this time extended, please follow-up with your primary care provider or return to urgent care for reevaluation so that we can provide you with a note for another 3 days.   Thank you for visiting [facility] today.  We appreciate the opportunity to participate in your care.     Disposition Upon Discharge:  Condition: stable for discharge home Home: take medications as prescribed; routine discharge instructions as discussed; follow up as advised.  Patient presented with an acute illness with associated systemic symptoms and significant discomfort requiring urgent management. In my opinion, this is a condition that a prudent lay person (someone who possesses an average knowledge of health and medicine) may potentially expect to result in complications if not addressed urgently such as respiratory distress, impairment of bodily function or dysfunction of bodily organs.   Routine symptom specific, illness specific and/or disease specific instructions were discussed with the patient and/or caregiver at length.   As such, the patient has  been evaluated and assessed, work-up was performed and treatment was provided in alignment with urgent care protocols and evidence based medicine.  Patient/parent/caregiver has been advised that the patient may require follow up for further testing and treatment if the symptoms continue in spite of treatment, as clinically indicated and appropriate.  Patient/parent/caregiver has been advised to report to orthopedic urgent care clinic or return to the [facility] or PCP in 3-5 days if no better; follow-up with orthopedics, PCP or the Emergency Department if new signs and symptoms develop or if the current signs or symptoms continue to change or worsen for further workup, evaluation and treatment  as clinically indicated and appropriate  The patient will follow up with their current PCP if and as advised. If the patient does not currently have a PCP we will have assisted them in obtaining one.   The patient may need specialty follow up if the symptoms continue, in spite of conservative treatment and management, for further workup, evaluation, consultation and treatment as clinically indicated and appropriate.  Patient/parent/caregiver verbalized understanding and agreement of plan as discussed.  All questions were addressed during visit.  Please see discharge instructions below for further details of plan.  This office note has been dictated using Teaching laboratory technician.  Unfortunately, this method of dictation can sometimes lead to typographical or grammatical errors.  I apologize for your inconvenience in advance if this occurs.  Please do not hesitate to reach out to me if clarification is needed.      Joesph Shaver Scales, PA-C 05/20/24 1450

## 2024-05-21 DIAGNOSIS — R972 Elevated prostate specific antigen [PSA]: Secondary | ICD-10-CM | POA: Diagnosis not present

## 2024-05-27 ENCOUNTER — Ambulatory Visit (INDEPENDENT_AMBULATORY_CARE_PROVIDER_SITE_OTHER): Payer: Self-pay | Admitting: Family Medicine

## 2024-05-28 DIAGNOSIS — R972 Elevated prostate specific antigen [PSA]: Secondary | ICD-10-CM | POA: Diagnosis not present

## 2024-05-28 DIAGNOSIS — N5201 Erectile dysfunction due to arterial insufficiency: Secondary | ICD-10-CM | POA: Diagnosis not present

## 2024-05-28 DIAGNOSIS — R399 Unspecified symptoms and signs involving the genitourinary system: Secondary | ICD-10-CM | POA: Diagnosis not present

## 2024-06-05 ENCOUNTER — Ambulatory Visit (INDEPENDENT_AMBULATORY_CARE_PROVIDER_SITE_OTHER): Admitting: Family Medicine

## 2024-06-14 ENCOUNTER — Encounter: Payer: Self-pay | Admitting: Gastroenterology

## 2024-07-01 ENCOUNTER — Ambulatory Visit (INDEPENDENT_AMBULATORY_CARE_PROVIDER_SITE_OTHER): Admitting: Family Medicine

## 2024-07-01 ENCOUNTER — Ambulatory Visit: Payer: Medicare HMO

## 2024-07-01 VITALS — BP 129/63 | HR 69 | Temp 98.0°F | Resp 16 | Ht 71.0 in | Wt 278.2 lb

## 2024-07-01 DIAGNOSIS — Z23 Encounter for immunization: Secondary | ICD-10-CM

## 2024-07-01 DIAGNOSIS — Z0000 Encounter for general adult medical examination without abnormal findings: Secondary | ICD-10-CM

## 2024-07-01 NOTE — Patient Instructions (Addendum)
 [name],  Thank you for taking the time for your Medicare Wellness Visit. I appreciate your continued commitment to your health goals. Please review the care plan we discussed, and feel free to reach out if I can assist you further.  Please note that Annual Wellness Visits do not include a physical exam. Some assessments may be limited, especially if the visit was conducted virtually. If needed, we may recommend an in-person follow-up with your provider.  Ongoing Care Seeing your primary care provider every 3 to 6 months helps us  monitor your health and provide consistent, personalized care.   Dr Antonio Meth: 11/21/24 8:40am, physical fasting Medicare AWV: 07/02/25 8:20am  Referrals If a referral was made during today's visit and you haven't received any updates within two weeks, please contact the referred provider directly to check on the status.  Colonoscopy:  Please call to schedule at your earliest convenience:  [phone]  Recommended Screenings:  You will need to get the following vaccines at your local pharmacy: COVID  Health Maintenance  Topic Date Due   Complete foot exam   12/23/2022   COVID-19 Vaccine (7 - 2025-26 season) 03/17/2024   Colon Cancer Screening  05/01/2024   Medicare Annual Wellness Visit  06/27/2024   Hemoglobin A1C  09/23/2024   Eye exam for diabetics  10/15/2024   Yearly kidney function blood test for diabetes  11/20/2024   Yearly kidney health urinalysis for diabetes  11/20/2024   DTaP/Tdap/Td vaccine (3 - Td or Tdap) 06/20/2032   Pneumococcal Vaccine for age over 49  Completed   Flu Shot  Completed   Hepatitis C Screening  Completed   Zoster (Shingles) Vaccine  Completed   Meningitis B Vaccine  Aged Out       06/30/2024   11:39 AM  Advanced Directives  Does Patient Have a Medical Advance Directive? Yes  Type of Advance Directive Healthcare Power of Attorney  Does patient want to make changes to medical advance directive? No - Patient  declined  Copy of Healthcare Power of Attorney in Chart? No - copy requested   Please bring a copy of your health care power of attorney and living will to the office to be added to your chart at your convenience. You can mail a copy to [facility] [address] [place], [place] [address] or email to [email or web address]   Vision: Annual vision screenings are recommended for early detection of glaucoma, cataracts, and diabetic retinopathy. These exams can also reveal signs of chronic conditions such as diabetes and high blood pressure.  Dental: Annual dental screenings help detect early signs of oral cancer, gum disease, and other conditions linked to overall health, including heart disease and diabetes.  Please see the attached documents for additional preventive care recommendations.

## 2024-07-01 NOTE — Progress Notes (Signed)
 Chief Complaint  Patient presents with   Medicare Wellness     Subjective:   Daniel Powers is a 68 y.o. male who presents for a Medicare Annual Wellness Visit.  Visit info / Clinical Intake: Medicare Wellness Visit Type:: Subsequent Annual Wellness Visit Persons participating in visit and providing information:: patient Medicare Wellness Visit Mode:: In-person (required for WTM) Interpreter Needed?: No Pre-visit prep was completed: yes AWV questionnaire completed by patient prior to visit?: yes Date:: 06/30/24 Living arrangements:: (Patient-Rptd) lives with spouse/significant other Patient's Overall Health Status Rating: (Patient-Rptd) good Typical amount of pain: (Patient-Rptd) none Does pain affect daily life?: (Patient-Rptd) no Are you currently prescribed opioids?: no  Dietary Habits and Nutritional Risks How many meals a day?: (Patient-Rptd) 2 Eats fruit and vegetables daily?: (!) (Patient-Rptd) no Most meals are obtained by: (Patient-Rptd) eating out In the last 2 weeks, have you had any of the following?: none Diabetic:: (!) yes Any non-healing wounds?: no How often do you check your BS?: 0 Would you like to be referred to a Nutritionist or for Diabetic Management? : no  Functional Status Activities of Daily Living (to include ambulation/medication): (Patient-Rptd) Independent Ambulation: (Patient-Rptd) Independent Medication Administration: (Patient-Rptd) Independent Home Management (perform basic housework or laundry): (Patient-Rptd) Independent Manage your own finances?: (Patient-Rptd) yes Primary transportation is: (Patient-Rptd) driving Concerns about vision?: no *vision screening is required for WTM* (up to date with Lynwood Ross) Concerns about hearing?: no  Fall Screening Falls in the past year?: (Patient-Rptd) 0 Number of falls in past year: 0 Was there an injury with Fall?: 0 Fall Risk Category Calculator: 0 Patient Fall Risk Level: Low Fall  Risk  Fall Risk Patient at Risk for Falls Due to: No Fall Risks Fall risk Follow up: Falls evaluation completed  Home and Transportation Safety: All rugs have non-skid backing?: (Patient-Rptd) yes All stairs or steps have railings?: (Patient-Rptd) yes Grab bars in the bathtub or shower?: (Patient-Rptd) yes Have non-skid surface in bathtub or shower?: (Patient-Rptd) yes Good home lighting?: (Patient-Rptd) yes Regular seat belt use?: (Patient-Rptd) yes Hospital stays in the last year:: (Patient-Rptd) no  Cognitive Assessment Difficulty concentrating, remembering, or making decisions? : (Patient-Rptd) no Will 6CIT or Mini Cog be Completed: yes What year is it?: 0 points What month is it?: 0 points Give patient an address phrase to remember (5 components): [address] [place]  About what time is it?: 0 points Count backwards from 20 to 1: 2 points Say the months of the year in reverse: 2 points Repeat the address phrase from earlier: 0 points 6 CIT Score: 4 points  Advance Directives (For Healthcare) Does Patient Have a Medical Advance Directive?: Yes Does patient want to make changes to medical advance directive?: No - Patient declined Type of Advance Directive: Healthcare Power of Attorney Copy of Healthcare Power of Attorney in Chart?: No - copy requested  Reviewed/Updated  Reviewed/Updated: Reviewed All (Medical, Surgical, Family, Medications, Allergies, Care Teams, Patient Goals)    Allergies (verified) Penicillins   Current Medications (verified) Outpatient Encounter Medications as of 07/01/2024  Medication Sig   acetaminophen  (TYLENOL ) 500 MG tablet Take 2 tablets (1,000 mg total) by mouth every 8 (eight) hours as needed for up to 30 doses for mild pain (pain score 1-3) or fever.   albuterol  (VENTOLIN  HFA) 108 (90 Base) MCG/ACT inhaler Inhale 1-2 puffs into the lungs every 6 (six) hours as needed for wheezing or shortness of breath.   amLODipine  (NORVASC ) 5  MG tablet TAKE 1 TABLET(5  MG) BY MOUTH DAILY   atorvastatin  (LIPITOR) 10 MG tablet TAKE 1 TABLET(10 MG) BY MOUTH DAILY   azelastine  (ASTELIN ) 0.1 % nasal spray Place 1 spray into both nostrils 2 (two) times daily. Use in each nostril as directed (Patient taking differently: Place 1 spray into both nostrils daily as needed. Use in each nostril as directed)   cetirizine (ZYRTEC) 10 MG tablet Take 10 mg by mouth daily.   Cholecalciferol (VITAMIN D3) 50 MCG (2000 UT) capsule Pt taking 2000 3 days/ wk   CINNAMON PO Take 1 tablet by mouth daily.   ELIQUIS  5 MG TABS tablet TAKE 1 TABLET(5 MG) BY MOUTH TWICE DAILY   fish oil-omega-3 fatty acids 1000 MG capsule Take 1 g by mouth daily.   Flaxseed, Linseed, 1000 MG CAPS Take 1 capsule by mouth daily.   fluticasone  (FLONASE ) 50 MCG/ACT nasal spray Place 2 sprays into both nostrils daily. (Patient taking differently: Place 2 sprays into both nostrils daily as needed.)   LORazepam  (ATIVAN ) 0.5 MG tablet Take 1 tablet (0.5 mg total) by mouth every 8 (eight) hours. (Patient taking differently: Take 0.5 mg by mouth daily as needed.)   olmesartan  (BENICAR ) 40 MG tablet TAKE 1 TABLET(40 MG) BY MOUTH DAILY   tadalafil (CIALIS) 20 MG tablet Take 20 mg by mouth daily as needed.   tirzepatide  (MOUNJARO ) 5 MG/0.5ML Pen Inject 5 mg into the skin once a week.   [DISCONTINUED] methylPREDNISolone  (MEDROL  DOSEPAK) 4 MG TBPK tablet Take 24 mg on day 1, 20 mg on day 2, 16 mg on day 3, 12 mg on day 4, 8 mg on day 5, 4 mg on day 6.  Take all tablets in each row at once, do not spread tablets out throughout the day.   No facility-administered encounter medications on file as of 07/01/2024.    History: Past Medical History:  Diagnosis Date   Allergy    Anxiety    Arthritis    Asthma    COPD (chronic obstructive pulmonary disease) (HCC)    Diabetes mellitus without complication (HCC)    DVT (deep venous thrombosis) (HCC)    Gout    High cholesterol    Hypertension     Influenza with pneumonia    Lactose intolerance    Obesity    Pre-diabetes    Psychosexual dysfunction with inhibited sexual excitement    Past Surgical History:  Procedure Laterality Date   HAND SURGERY     right   TONSILLECTOMY AND ADENOIDECTOMY     Family History  Problem Relation Age of Onset   Hypertension Mother    Alzheimer's disease Mother    Arthritis Mother    Hypertension Father    Alcohol abuse Father    Obesity Father    Cancer Sister        male organs   Stroke Brother    Heart disease Brother 50       MI   Hypertension Maternal Grandmother    Alzheimer's disease Maternal Grandmother    Arthritis Maternal Grandmother    Hypertension Maternal Grandfather    Arthritis Maternal Grandfather    Hypertension Paternal Grandmother    Obesity Paternal Grandmother    Hypertension Paternal Grandfather    Lung cancer Maternal Uncle    Esophageal cancer Maternal Uncle    Alzheimer's disease Paternal Aunt    Cancer Niece    Colon cancer Neg Hx    Social History   Occupational History   Occupation: attorney at social worker--  w/s    Employer: [employer]  Tobacco Use   Smoking status: Never    Passive exposure: Never   Smokeless tobacco: Never  Vaping Use   Vaping status: Never Used  Substance and Sexual Activity   Alcohol use: Yes    Alcohol/week: 1.0 standard drink of alcohol    Types: 1 Cans of beer per week    Comment: I may drink one beer every three months   Drug use: No   Sexual activity: Yes    Partners: Female   Tobacco Counseling Counseling given: Not Answered  SDOH Screenings   Food Insecurity: No Food Insecurity (06/30/2024)  Housing: Low Risk (06/30/2024)  Transportation Needs: No Transportation Needs (06/30/2024)  Utilities: Not At Risk (07/01/2024)  Alcohol Screen: Low Risk (06/30/2024)  Depression (PHQ2-9): Low Risk (07/01/2024)  Financial Resource Strain: Low Risk (06/30/2024)  Physical Activity: Insufficiently Active (06/30/2024)  Social  Connections: Moderately Integrated (06/30/2024)  Stress: No Stress Concern Present (06/30/2024)  Tobacco Use: Low Risk (07/01/2024)  Health Literacy: Adequate Health Literacy (06/28/2023)   See flowsheets for full screening details  Depression Screen PHQ 2 & 9 Depression Scale- Over the past 2 weeks, how often have you been bothered by any of the following problems? Little interest or pleasure in doing things: 0 Feeling down, depressed, or hopeless (PHQ Adolescent also includes...irritable): 0 PHQ-2 Total Score: 0     Goals Addressed             This Visit's Progress    Patient Stated   Not on track    Increase exercise             Objective:    Today's Vitals   07/01/24 0817  BP: 129/63  Pulse: 69  Resp: 16  Temp: 98 F (36.7 C)  TempSrc: Oral  SpO2: 100%  Weight: 278 lb 3.2 oz (126.2 kg)  Height: 5' 11 (1.803 m)   Body mass index is 38.8 kg/m.  Hearing/Vision screen No results found. Immunizations and Health Maintenance Health Maintenance  Topic Date Due   FOOT EXAM  12/23/2022   COVID-19 Vaccine (7 - 2025-26 season) 03/17/2024   Colonoscopy  05/01/2024   HEMOGLOBIN A1C  09/23/2024   OPHTHALMOLOGY EXAM  10/15/2024   Diabetic kidney evaluation - eGFR measurement  11/20/2024   Diabetic kidney evaluation - Urine ACR  11/20/2024   Medicare Annual Wellness (AWV)  07/01/2025   DTaP/Tdap/Td (3 - Td or Tdap) 06/20/2032   Pneumococcal Vaccine: 50+ Years  Completed   Influenza Vaccine  Completed   Hepatitis C Screening  Completed   Zoster Vaccines- Shingrix   Completed   Meningococcal B Vaccine  Aged Out        Assessment/Plan:  This is a routine wellness examination for Daniel Powers.  Patient Care Team: Antonio Meth, Jamee SAUNDERS, DO as PCP - General Watt Rush, MD as Attending Physician (Urology) Watt Rush, MD as Attending Physician (Urology) Alvan Agent, MD as Referring Physician (Ophthalmology)  I have personally reviewed and noted the following in  the patients chart:   Medical and social history Use of alcohol, tobacco or illicit drugs  Current medications and supplements including opioid prescriptions. Functional ability and status Nutritional status Physical activity Advanced directives List of other physicians Hospitalizations, surgeries, and ER visits in previous 12 months Vitals Screenings to include cognitive, depression, and falls Referrals and appointments  Orders Placed This Encounter  Procedures   Flu vaccine HIGH DOSE PF(Fluzone Trivalent)   In addition, I have reviewed and  discussed with patient certain preventive protocols, quality metrics, and best practice recommendations. A written personalized care plan for preventive services as well as general preventive health recommendations were provided to patient.   Lolita Libra, CMA   07/01/2024   Return in 1 year (on 07/01/2025).  After Visit Summary: (In Person-Printed) AVS printed and given to the patient  Nurse Notes: Appointment(s) made: (PCP, AWV) HM Addressed: Vaccines Given today: flu Pt received recall letter from GI and will call to schedule. Will get COVID booster at pharmacy.

## 2024-07-02 ENCOUNTER — Ambulatory Visit (INDEPENDENT_AMBULATORY_CARE_PROVIDER_SITE_OTHER): Admitting: Family Medicine

## 2024-07-02 ENCOUNTER — Encounter (INDEPENDENT_AMBULATORY_CARE_PROVIDER_SITE_OTHER): Payer: Self-pay | Admitting: Family Medicine

## 2024-07-02 DIAGNOSIS — Z6837 Body mass index (BMI) 37.0-37.9, adult: Secondary | ICD-10-CM

## 2024-07-02 DIAGNOSIS — E1169 Type 2 diabetes mellitus with other specified complication: Secondary | ICD-10-CM

## 2024-07-02 DIAGNOSIS — Z7985 Long-term (current) use of injectable non-insulin antidiabetic drugs: Secondary | ICD-10-CM

## 2024-07-02 DIAGNOSIS — I10 Essential (primary) hypertension: Secondary | ICD-10-CM

## 2024-07-02 DIAGNOSIS — E669 Obesity, unspecified: Secondary | ICD-10-CM

## 2024-07-02 DIAGNOSIS — I152 Hypertension secondary to endocrine disorders: Secondary | ICD-10-CM

## 2024-07-02 DIAGNOSIS — E1159 Type 2 diabetes mellitus with other circulatory complications: Secondary | ICD-10-CM | POA: Diagnosis not present

## 2024-07-02 MED ORDER — TIRZEPATIDE 5 MG/0.5ML ~~LOC~~ SOAJ: 5.0000 mg | SUBCUTANEOUS | 1 refills | Status: AC

## 2024-07-02 NOTE — Progress Notes (Signed)
 "  SUBJECTIVE:  Chief Complaint: Obesity  Interim History: Patient here for 6 week follow up.  Mentions he may have eaten too much at Thanksgiving but otherwise found it difficult to stay consistent on plan and was occasionally skipping meals.  Lunch tends to be the meal he skips 5/5 weekdays.  Work not slowing down the next few weeks due to end of year demands.  His intention is to retire in 2026.  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  For lunch he would buy lunch and get something like [employer] or [employer].    Daniel Powers is here to discuss his progress with his obesity treatment plan. He is on the Category 1 Plan and states he is following his eating plan approximately 50 % of the time. He states he is not exercising.   OBJECTIVE: Visit Diagnoses: Problem List Items Addressed This Visit       Cardiovascular and Mediastinum   Hypertension associated with type 2 diabetes mellitus (HCC)   Relevant Medications   tirzepatide  (MOUNJARO ) 5 MG/0.5ML Pen     Endocrine   Type 2 diabetes mellitus in patient with obesity (HCC)   Relevant Medications   tirzepatide  (MOUNJARO ) 5 MG/0.5ML Pen     Other   Morbid obesity (HCC) - Primary   Relevant Medications   tirzepatide  (MOUNJARO ) 5 MG/0.5ML Pen   Other Visit Diagnoses       BMI 37.0-37.9, adult           Vitals Temp: 98 F (36.7 C) BP: 137/76 Pulse Rate: 85 SpO2: 99 %   Anthropometric Measurements Height: 5' 11 (1.803 m) Weight: 271 lb (122.9 kg) BMI (Calculated): 37.81 Weight at Last Visit: 270 lb Weight Lost Since Last Visit: 0 Weight Gained Since Last Visit: 1 Starting Weight: 309 lb Total Weight Loss (lbs): 38 lb (17.2 kg)   Body Composition  Body Fat %: 34.6 % Fat Mass (lbs): 94 lbs Muscle Mass (lbs): 169 lbs Total Body Water (lbs): 126.2 lbs Visceral Fat Rating : 23   Other Clinical Data Today's Visit #: 60 Starting Date: 12/25/18 Comments: Cat  1     ASSESSMENT AND PLAN: Assessment & Plan Type 2 diabetes mellitus in patient with obesity (HCC) Tolerating his appetite at 5 mg dosage.  Discussed importance of not skipping meals which patient has been on back into the habit of.  Will refill tirzepatide  at 5 mg dosage and reevaluate intake at next appointment. Hypertension associated with type 2 diabetes mellitus (HCC) Blood pressure is controlled but upper range of normal today.  Will follow-up on blood pressures at next appointment.  No chest pain, chest pressure, or headache reported. BMI 37.0-37.9, adult  Morbid obesity (HCC)-start bmi 43.10/DATE 12/25/2018    Diet: Daniel Powers is currently in the action stage of change. As such, his goal is to continue with weight loss efforts and has agreed to the Category 1 Plan.   Exercise:  Older adults should determine their level of effort for physical activity relative to their level of fitness.  Patient goal until next appointment will be 5-10 minutes 3 times a week of some type of activity to get consistency and routine established.  Behavior Modification:  We discussed the following Behavioral Modification Strategies today: increasing lean protein intake, decreasing simple carbohydrates, meal planning and cooking strategies, and planning for success.   Return in about 6 weeks (around 08/13/2024) for repeat IC with Dr. MALVA.   He was informed  of the importance of frequent follow up visits to maximize his success with intensive lifestyle modifications for his multiple health conditions.  Attestation Statements:   Reviewed by clinician on day of visit: allergies, medications, problem list, medical history, surgical history, family history, social history, and previous encounter notes.    Adelita Cho, MD "

## 2024-07-11 ENCOUNTER — Other Ambulatory Visit: Payer: Self-pay | Admitting: Family Medicine

## 2024-07-11 DIAGNOSIS — I10 Essential (primary) hypertension: Secondary | ICD-10-CM

## 2024-07-14 ENCOUNTER — Encounter: Payer: Self-pay | Admitting: Physician Assistant

## 2024-07-14 NOTE — Assessment & Plan Note (Signed)
 Tolerating his appetite at 5 mg dosage.  Discussed importance of not skipping meals which patient has been on back into the habit of.  Will refill tirzepatide  at 5 mg dosage and reevaluate intake at next appointment.

## 2024-07-14 NOTE — Assessment & Plan Note (Signed)
 Blood pressure is controlled but upper range of normal today.  Will follow-up on blood pressures at next appointment.  No chest pain, chest pressure, or headache reported.

## 2024-08-13 ENCOUNTER — Ambulatory Visit (INDEPENDENT_AMBULATORY_CARE_PROVIDER_SITE_OTHER): Admitting: Family Medicine

## 2024-08-13 NOTE — Progress Notes (Unsigned)
 "     Ellouise Console, PA-C [address] [place], [place]  [address] Phone: [phone]   Gastroenterology Consultation  Referring Provider:     Antonio Meth, Jamee SAUNDERS, DO Primary Care Physician:  Antonio Meth, Jamee SAUNDERS, DO Primary Gastroenterologist:  Ellouise Console, PA-C / Elspeth Naval, MD  Reason for Consultation:     History of colon polyps, repeat colonoscopy        HPI:   Discussed the use of AI scribe software for clinical note transcription with the patient, who gave verbal consent to proceed.  04/2017 last colonoscopy by Dr. Naval: 2 small (3 mm) tubular adenoma polyps removed.  Small internal hemorrhoids.  Otherwise normal.  Good prep.  7-year repeat (due 04/2024).  09/2011 colonoscopy by Dr. Debrah: 2 small (2 mm to 4 mm) tubular adenoma polyps removed.  5-year repeat.  PMH: Obesity, Hx DVTs, HTN, diabetes type 2, HLD, sleep apnea, currently on Eliquis  and Mounjaro .  Eliquis  prescribed by PCP.  History of Present Illness Daniel Powers is a 69 year old male with a history of colonic polyps who presents for scheduling a surveillance colonoscopy.  Colorectal Neoplasia Surveillance: - Underwent colonoscopy in October 2018 with removal of two small precancerous polyps - Surveillance colonoscopy recommended in seven years and is now due - No new gastrointestinal symptoms, including abdominal pain, heartburn, or dysphagia - No family history of colon cancer  Bowel Habit Abnormalities: - Lifelong alternating constipation and diarrhea - Considers these symptoms typical for him  Venous Thromboembolism: - History of blood clots - Takes Eliquis  for anticoagulation - Refills managed by primary care provider - No recent changes in health  Respiratory Symptoms and Asthma: - Severe episode of anxiety and asthma last spring, without recurrence since summer - Inhalers available but not recently needed - No shortness of breath or chest pain    Past Medical History:   Diagnosis Date   Allergy    Anxiety    Arthritis    Asthma    COPD (chronic obstructive pulmonary disease) (HCC)    Diabetes mellitus without complication (HCC)    DVT (deep venous thrombosis) (HCC)    Gout    High cholesterol    Hypertension    Influenza with pneumonia    Lactose intolerance    Obesity    Pre-diabetes    Psychosexual dysfunction with inhibited sexual excitement     Past Surgical History:  Procedure Laterality Date   HAND SURGERY     right   TONSILLECTOMY AND ADENOIDECTOMY      Prior to Admission medications  Medication Sig Start Date End Date Taking? Authorizing Provider  acetaminophen  (TYLENOL ) 500 MG tablet Take 2 tablets (1,000 mg total) by mouth every 8 (eight) hours as needed for up to 30 doses for mild pain (pain score 1-3) or fever. 05/20/24   Joesph Shaver Scales, PA-C  albuterol  (VENTOLIN  HFA) 108 (90 Base) MCG/ACT inhaler Inhale 1-2 puffs into the lungs every 6 (six) hours as needed for wheezing or shortness of breath. 08/22/23   Enedelia Dorna HERO, FNP  amLODipine  (NORVASC ) 5 MG tablet TAKE 1 TABLET(5 MG) BY MOUTH DAILY 07/11/24   Antonio Meth, Yvonne R, DO  atorvastatin  (LIPITOR) 10 MG tablet TAKE 1 TABLET(10 MG) BY MOUTH DAILY 02/19/24   Lowne Chase, Yvonne R, DO  azelastine  (ASTELIN ) 0.1 % nasal spray Place 1 spray into both nostrils 2 (two) times daily. Use in each nostril as directed Patient taking differently: Place 1 spray into  both nostrils daily as needed. Use in each nostril as directed 11/19/23   Antonio Meth, Jamee SAUNDERS, DO  cetirizine (ZYRTEC) 10 MG tablet Take 10 mg by mouth daily.    [provider]  Cholecalciferol (VITAMIN D3) 50 MCG (2000 UT) capsule Pt taking 2000 3 days/ wk 04/23/24   Midge Sober, DO  CINNAMON PO Take 1 tablet by mouth daily.    [provider]  ELIQUIS  5 MG TABS tablet TAKE 1 TABLET(5 MG) BY MOUTH TWICE DAILY 04/14/24   Antonio Meth, Yvonne R, DO  fish oil-omega-3 fatty acids 1000 MG capsule Take 1  g by mouth daily.    [provider]  Flaxseed, Linseed, 1000 MG CAPS Take 1 capsule by mouth daily.    [provider]  fluticasone  (FLONASE ) 50 MCG/ACT nasal spray Place 2 sprays into both nostrils daily. Patient taking differently: Place 2 sprays into both nostrils daily as needed. 09/09/21   Frann Mabel Mt, DO  LORazepam  (ATIVAN ) 0.5 MG tablet Take 1 tablet (0.5 mg total) by mouth every 8 (eight) hours. Patient taking differently: Take 0.5 mg by mouth daily as needed. 08/24/23   Antonio Meth Jamee R, DO  olmesartan  (BENICAR ) 40 MG tablet TAKE 1 TABLET(40 MG) BY MOUTH DAILY 03/14/24   Antonio Meth, Yvonne R, DO  tadalafil (CIALIS) 20 MG tablet Take 20 mg by mouth daily as needed.    [provider]  tirzepatide  (MOUNJARO ) 5 MG/0.5ML Pen Inject 5 mg into the skin once a week. 07/02/24   [place] Adelita PENNER, MD    Family History  Problem Relation Age of Onset   Hypertension Mother    Alzheimer's disease Mother    Arthritis Mother    Hypertension Father    Alcohol abuse Father    Obesity Father    Cancer Sister        male organs   Stroke Brother    Heart disease Brother 76       MI   Hypertension Maternal Grandmother    Alzheimer's disease Maternal Grandmother    Arthritis Maternal Grandmother    Hypertension Maternal Grandfather    Arthritis Maternal Grandfather    Hypertension Paternal Grandmother    Obesity Paternal Grandmother    Hypertension Paternal Grandfather    Lung cancer Maternal Uncle    Esophageal cancer Maternal Uncle    Alzheimer's disease Paternal Aunt    Cancer Niece    Colon cancer Neg Hx      Social History[1]  Allergies as of 08/14/2024 - Review Complete 08/14/2024  Allergen Reaction Noted   Penicillins Other (See Comments) 12/20/2006    Review of Systems:    All systems reviewed and negative except where noted in HPI.   Physical Exam:  BP 118/68   Pulse 84   Ht 5' 11 (1.803 m)   Wt 278 lb 6 oz (126.3 kg)    BMI 38.83 kg/m  No LMP for male patient.  General:   Alert,  Well-developed, well-nourished, pleasant and cooperative in NAD Lungs:  Respirations even and unlabored.  Clear throughout to auscultation.   No wheezes, crackles, or rhonchi. No acute distress. Heart:  Regular rate and rhythm; no murmurs, clicks, rubs, or gallops. Abdomen:  Normal bowel sounds.  No bruits.  Soft, and non-distended without masses, hepatosplenomegaly or hernias noted.  No Tenderness.  No guarding or rebound tenderness.    Neurologic:  Alert and oriented x3;  grossly normal neurologically. Psych:  Alert and cooperative. Normal mood  and affect.   Imaging Studies: No results found.  Labs: CBC    Component Value Date/Time   WBC 5.8 11/21/2023 0800   RBC 4.92 11/21/2023 0800   HGB 14.1 11/21/2023 0800   HGB 14.5 10/03/2021 0749   HGB 12.7 (L) 10/10/2010 0935   HCT 43.1 11/21/2023 0800   HCT 43.8 10/03/2021 0749   HCT 39.2 10/10/2010 0935   PLT 183.0 11/21/2023 0800   PLT 191 10/03/2021 0749   MCV 87.6 11/21/2023 0800   MCV 86 10/03/2021 0749   MCV 77 (L) 10/10/2010 0935    CMP     Component Value Date/Time   NA 138 11/21/2023 0800   NA 140 07/24/2023 0909   K 3.9 11/21/2023 0800   CL 105 11/21/2023 0800   CO2 26 11/21/2023 0800   GLUCOSE 98 11/21/2023 0800   GLUCOSE 85 05/07/2006 1230   BUN 21 11/21/2023 0800   BUN 17 07/24/2023 0909   CREATININE 1.03 11/21/2023 0800   CREATININE 0.85 08/01/2013 1250   CALCIUM  9.2 11/21/2023 0800   PROT 6.5 11/21/2023 0800   PROT 6.5 07/24/2023 0909   ALBUMIN 4.1 11/21/2023 0800   ALBUMIN 4.0 07/24/2023 0909   AST 11 11/21/2023 0800   ALT 11 11/21/2023 0800   ALKPHOS 39 11/21/2023 0800   BILITOT 0.8 11/21/2023 0800   BILITOT 0.9 07/24/2023 0909   GFRNONAA 90 05/31/2020 0912   GFRAA 104 05/31/2020 0912    Assessment and Plan:   KENTARO ALEWINE is a 69 y.o. y/o male has been referred for   1.  History of adenomatous colon polyps: 7-year repeat  surveillance colonoscopy is due - Scheduling Colonoscopy I discussed risks of colonoscopy with patient to include risk of bleeding, colon perforation, and risk of sedation.  Patient expressed understanding and agrees to proceed with colonoscopy.   2.  Comorbidities: Obesity, Hx DVTs, HTN,, diabetes type 2, HLD, sleep apnea, currently on Eliquis  and Mounjaro .  Eliquis  prescribed by PCP. - Request permission to hold Eliquis  2 days prior to colonoscopy from PCP - Hold Mounjaro  1 week prior to colonoscopy  Follow up as needed based on colonoscopy results.  Ellouise Console, PA-C       [1]  Social History Tobacco Use   Smoking status: Never    Passive exposure: Never   Smokeless tobacco: Never  Vaping Use   Vaping status: Never Used  Substance Use Topics   Alcohol use: Yes    Alcohol/week: 1.0 standard drink of alcohol    Types: 1 Cans of beer per week    Comment: I may drink one beer every three months   Drug use: No   "

## 2024-08-14 ENCOUNTER — Encounter: Payer: Self-pay | Admitting: Physician Assistant

## 2024-08-14 ENCOUNTER — Telehealth: Payer: Self-pay

## 2024-08-14 ENCOUNTER — Ambulatory Visit: Admitting: Physician Assistant

## 2024-08-14 VITALS — BP 118/68 | HR 84 | Ht 71.0 in | Wt 278.4 lb

## 2024-08-14 DIAGNOSIS — Z860100 Personal history of colon polyps, unspecified: Secondary | ICD-10-CM

## 2024-08-14 DIAGNOSIS — Z860101 Personal history of adenomatous and serrated colon polyps: Secondary | ICD-10-CM

## 2024-08-14 DIAGNOSIS — Z86718 Personal history of other venous thrombosis and embolism: Secondary | ICD-10-CM

## 2024-08-14 DIAGNOSIS — Z7901 Long term (current) use of anticoagulants: Secondary | ICD-10-CM | POA: Diagnosis not present

## 2024-08-14 MED ORDER — NA SULFATE-K SULFATE-MG SULF 17.5-3.13-1.6 GM/177ML PO SOLN: 1.0000 | ORAL | 0 refills | Status: AC

## 2024-08-14 NOTE — Patient Instructions (Signed)
 You have been scheduled for a colonoscopy. Please follow written instructions given to you at your visit today.   If you use inhalers (even only as needed), please bring them with you on the day of your procedure.  DO NOT TAKE 7 DAYS PRIOR TO TEST- Trulicity (dulaglutide) Ozempic, Wegovy (semaglutide) Mounjaro , Zepbound  (tirzepatide ) Bydureon Bcise (exanatide extended release)  DO NOT TAKE 1 DAY PRIOR TO YOUR TEST Rybelsus (semaglutide) Adlyxin (lixisenatide) Victoza (liraglutide) Byetta (exanatide) ___________________________________________________________________________  I appreciate the opportunity to care for you. Ellouise Console, PA

## 2024-08-14 NOTE — Telephone Encounter (Signed)
" °  Daniel Powers 1955/11/10 [id number]      Dear Dr Antonio:  We have scheduled the above named patient for a(n) colonoscopy procedure. Our records show that (s)he is on anticoagulation therapy.  Please advise as to whether the patient may come off their therapy of Eliquis  2 days prior to their procedure which is scheduled for 09/15/24.  Please route your response to [name], CMA or fax response to [phone] or [phone].   Sincerely,    [facility]   "

## 2024-08-14 NOTE — Progress Notes (Signed)
 Agree with assessment and plan as outlined.

## 2024-08-14 NOTE — Telephone Encounter (Signed)
 Daniel Powers has been informed to hold the Eliquis  2 days prior to his procedure. He verbalized understanding.

## 2024-08-20 ENCOUNTER — Other Ambulatory Visit: Payer: Self-pay | Admitting: Family Medicine

## 2024-08-20 DIAGNOSIS — E785 Hyperlipidemia, unspecified: Secondary | ICD-10-CM

## 2024-09-02 ENCOUNTER — Ambulatory Visit (INDEPENDENT_AMBULATORY_CARE_PROVIDER_SITE_OTHER): Admitting: Family Medicine

## 2024-09-15 ENCOUNTER — Encounter: Admitting: Gastroenterology

## 2024-11-21 ENCOUNTER — Encounter: Admitting: Family Medicine

## 2025-07-02 ENCOUNTER — Ambulatory Visit
